# Patient Record
Sex: Female | Born: 1935 | Race: White | Hispanic: No | Smoking: Never smoker
Health system: Southern US, Community
[De-identification: ages and names within clinical notes are randomized; demographics above are authoritative.]

## PROBLEM LIST (undated history)

## (undated) DIAGNOSIS — L98499 Non-pressure chronic ulcer of skin of other sites with unspecified severity: Secondary | ICD-10-CM

## (undated) DIAGNOSIS — I739 Peripheral vascular disease, unspecified: Secondary | ICD-10-CM

## (undated) DIAGNOSIS — I10 Essential (primary) hypertension: Secondary | ICD-10-CM

## (undated) DIAGNOSIS — T4145XA Adverse effect of unspecified anesthetic, initial encounter: Secondary | ICD-10-CM

## (undated) DIAGNOSIS — C801 Malignant (primary) neoplasm, unspecified: Secondary | ICD-10-CM

## (undated) DIAGNOSIS — I639 Cerebral infarction, unspecified: Secondary | ICD-10-CM

## (undated) DIAGNOSIS — J189 Pneumonia, unspecified organism: Secondary | ICD-10-CM

## (undated) DIAGNOSIS — C50919 Malignant neoplasm of unspecified site of unspecified female breast: Secondary | ICD-10-CM

## (undated) DIAGNOSIS — I5032 Chronic diastolic (congestive) heart failure: Secondary | ICD-10-CM

## (undated) DIAGNOSIS — T8859XA Other complications of anesthesia, initial encounter: Secondary | ICD-10-CM

## (undated) DIAGNOSIS — I4891 Unspecified atrial fibrillation: Secondary | ICD-10-CM

## (undated) DIAGNOSIS — M1990 Unspecified osteoarthritis, unspecified site: Secondary | ICD-10-CM

## (undated) DIAGNOSIS — I70209 Unspecified atherosclerosis of native arteries of extremities, unspecified extremity: Secondary | ICD-10-CM

## (undated) DIAGNOSIS — K5900 Constipation, unspecified: Secondary | ICD-10-CM

## (undated) DIAGNOSIS — E119 Type 2 diabetes mellitus without complications: Secondary | ICD-10-CM

## (undated) DIAGNOSIS — H3530 Unspecified macular degeneration: Secondary | ICD-10-CM

## (undated) HISTORY — PX: ABDOMINAL HYSTERECTOMY: SHX81

## (undated) HISTORY — PX: DILATION AND CURETTAGE OF UTERUS: SHX78

## (undated) HISTORY — DX: Chronic diastolic (congestive) heart failure: I50.32

## (undated) HISTORY — PX: CATARACT EXTRACTION W/ INTRAOCULAR LENS  IMPLANT, BILATERAL: SHX1307

---

## 2001-03-07 ENCOUNTER — Encounter (HOSPITAL_COMMUNITY): Admission: RE | Admit: 2001-03-07 | Discharge: 2001-04-06 | Payer: Self-pay | Admitting: *Deleted

## 2002-12-18 ENCOUNTER — Encounter: Admission: RE | Admit: 2002-12-18 | Discharge: 2003-03-18 | Payer: Self-pay | Admitting: Endocrinology

## 2006-11-01 ENCOUNTER — Ambulatory Visit (HOSPITAL_COMMUNITY): Admission: RE | Admit: 2006-11-01 | Discharge: 2006-11-01 | Payer: Self-pay | Admitting: Ophthalmology

## 2006-11-17 ENCOUNTER — Ambulatory Visit (HOSPITAL_COMMUNITY): Admission: RE | Admit: 2006-11-17 | Discharge: 2006-11-17 | Payer: Self-pay | Admitting: Ophthalmology

## 2006-11-30 ENCOUNTER — Ambulatory Visit (HOSPITAL_COMMUNITY): Admission: RE | Admit: 2006-11-30 | Discharge: 2006-11-30 | Payer: Self-pay | Admitting: Ophthalmology

## 2007-04-18 ENCOUNTER — Emergency Department (HOSPITAL_COMMUNITY): Admission: EM | Admit: 2007-04-18 | Discharge: 2007-04-18 | Payer: Self-pay | Admitting: Emergency Medicine

## 2011-01-22 LAB — POCT CARDIAC MARKERS
CKMB, poc: 2.7
Myoglobin, poc: 101
Operator id: 198171
Troponin i, poc: <0.05

## 2011-01-22 LAB — I-STAT 8, (EC8 V) (CONVERTED LAB)
BUN: 14
Bicarbonate: 24.9 — ABNORMAL HIGH
Chloride: 108
Glucose, Bld: 64 — ABNORMAL LOW
HCT: 41
Hemoglobin: 13.9
Operator id: 198171
Potassium: 3.9
Sodium: 140
TCO2: 26
pCO2, Ven: 38.8 — ABNORMAL LOW
pH, Ven: 7.415 — ABNORMAL HIGH

## 2011-01-22 LAB — CBC
HCT: 39.2
Hemoglobin: 13.2
MCHC: 33.8
MCV: 85.7
Platelets: 262
RBC: 4.57
RDW: 14
WBC: 5.8

## 2011-01-22 LAB — DIFFERENTIAL
Basophils Absolute: 0
Basophils Relative: 0
Eosinophils Absolute: 0.1
Eosinophils Relative: 1
Lymphocytes Relative: 27
Lymphs Abs: 1.6
Monocytes Absolute: 0.7
Monocytes Relative: 13 — ABNORMAL HIGH
Neutro Abs: 3.4
Neutrophils Relative %: 58

## 2011-01-22 LAB — POCT I-STAT CREATININE
Creatinine, Ser: 1
Operator id: 198171

## 2011-02-01 LAB — BASIC METABOLIC PANEL
BUN: 13
BUN: 18
CO2: 28
CO2: 29
Calcium: 9.4
Calcium: 9.8
Chloride: 102
Chloride: 99
Creatinine, Ser: 0.91
Creatinine, Ser: 0.93
GFR calc Af Amer: >60
GFR calc Af Amer: >60
GFR calc non Af Amer: 58 — ABNORMAL LOW
GFR calc non Af Amer: 60 — ABNORMAL LOW
Glucose, Bld: 103 — ABNORMAL HIGH
Glucose, Bld: 144 — ABNORMAL HIGH
Potassium: 3 — ABNORMAL LOW
Potassium: 4.3
Sodium: 137
Sodium: 139

## 2011-02-01 LAB — HEMOGLOBIN AND HEMATOCRIT, BLOOD
HCT: 39.3
Hemoglobin: 13.2

## 2012-08-30 ENCOUNTER — Ambulatory Visit: Payer: Self-pay | Admitting: Cardiology

## 2014-04-27 ENCOUNTER — Encounter (HOSPITAL_COMMUNITY): Payer: Self-pay | Admitting: Emergency Medicine

## 2014-04-27 ENCOUNTER — Emergency Department (HOSPITAL_COMMUNITY)
Admission: EM | Admit: 2014-04-27 | Discharge: 2014-04-27 | Disposition: A | Discharge status: Home / Self Care | Payer: Medicare Other | Attending: Emergency Medicine | Admitting: Emergency Medicine

## 2014-04-27 DIAGNOSIS — R197 Diarrhea, unspecified: Secondary | ICD-10-CM | POA: Diagnosis present

## 2014-04-27 DIAGNOSIS — K529 Noninfective gastroenteritis and colitis, unspecified: Secondary | ICD-10-CM | POA: Diagnosis not present

## 2014-04-27 DIAGNOSIS — I10 Essential (primary) hypertension: Secondary | ICD-10-CM | POA: Insufficient documentation

## 2014-04-27 DIAGNOSIS — Z7902 Long term (current) use of antithrombotics/antiplatelets: Secondary | ICD-10-CM | POA: Insufficient documentation

## 2014-04-27 DIAGNOSIS — Z8673 Personal history of transient ischemic attack (TIA), and cerebral infarction without residual deficits: Secondary | ICD-10-CM | POA: Diagnosis not present

## 2014-04-27 DIAGNOSIS — Z79899 Other long term (current) drug therapy: Secondary | ICD-10-CM | POA: Insufficient documentation

## 2014-04-27 HISTORY — DX: Essential (primary) hypertension: I10

## 2014-04-27 HISTORY — DX: Cerebral infarction, unspecified: I63.9

## 2014-04-27 LAB — CBC WITH DIFFERENTIAL/PLATELET
Basophils Absolute: 0 10*3/uL (ref 0.0–0.1)
Basophils Relative: 0 % (ref 0–1)
Eosinophils Absolute: 0.2 10*3/uL (ref 0.0–0.7)
Eosinophils Relative: 2 % (ref 0–5)
HCT: 44.2 % (ref 36.0–46.0)
Hemoglobin: 14.3 g/dL (ref 12.0–15.0)
Lymphocytes Relative: 27 % (ref 12–46)
Lymphs Abs: 1.9 10*3/uL (ref 0.7–4.0)
MCH: 28.3 pg (ref 26.0–34.0)
MCHC: 32.4 g/dL (ref 30.0–36.0)
MCV: 87.5 fL (ref 78.0–100.0)
Monocytes Absolute: 0.5 10*3/uL (ref 0.1–1.0)
Monocytes Relative: 8 % (ref 3–12)
Neutro Abs: 4.3 10*3/uL (ref 1.7–7.7)
Neutrophils Relative %: 63 % (ref 43–77)
Platelets: 178 10*3/uL (ref 150–400)
RBC: 5.05 MIL/uL (ref 3.87–5.11)
RDW: 14 % (ref 11.5–15.5)
WBC: 6.9 10*3/uL (ref 4.0–10.5)

## 2014-04-27 LAB — URINALYSIS, ROUTINE W REFLEX MICROSCOPIC
Bilirubin Urine: NEGATIVE
Glucose, UA: NEGATIVE mg/dL
Hgb urine dipstick: NEGATIVE
Ketones, ur: NEGATIVE mg/dL
Leukocytes, UA: NEGATIVE
Nitrite: NEGATIVE
Protein, ur: NEGATIVE mg/dL
Specific Gravity, Urine: 1.02 (ref 1.005–1.030)
Urobilinogen, UA: 0.2 mg/dL (ref 0.0–1.0)
pH: 7 (ref 5.0–8.0)

## 2014-04-27 LAB — BASIC METABOLIC PANEL
Anion gap: 8 (ref 5–15)
BUN: 21 mg/dL (ref 6–23)
CO2: 25 mmol/L (ref 19–32)
Calcium: 9.1 mg/dL (ref 8.4–10.5)
Chloride: 109 mEq/L (ref 96–112)
Creatinine, Ser: 1.59 mg/dL — ABNORMAL HIGH (ref 0.50–1.10)
GFR calc Af Amer: 33 mL/min — ABNORMAL LOW (ref >90)
GFR calc non Af Amer: 28 mL/min — ABNORMAL LOW (ref >90)
Glucose, Bld: 140 mg/dL — ABNORMAL HIGH (ref 70–99)
Potassium: 3.7 mmol/L (ref 3.5–5.1)
Sodium: 142 mmol/L (ref 135–145)

## 2014-04-27 LAB — TROPONIN I: Troponin I: <0.03 ng/mL (ref <0.031)

## 2014-04-27 MED ORDER — SODIUM CHLORIDE 0.9 % IV BOLUS (SEPSIS)
1000.0000 mL | Freq: Once | INTRAVENOUS | Status: AC
  Administered 2014-04-27: 1000 mL via INTRAVENOUS

## 2014-04-27 MED ORDER — DIPHENOXYLATE-ATROPINE 2.5-0.025 MG PO TABS: 1.0000 | ORAL_TABLET | Freq: Three times a day (TID) | ORAL | Status: DC | PRN

## 2014-04-27 MED ORDER — ONDANSETRON HCL 4 MG/2ML IJ SOLN
4.0000 mg | Freq: Once | INTRAMUSCULAR | Status: AC
  Administered 2014-04-27: 4 mg via INTRAVENOUS
  Filled 2014-04-27: qty 2

## 2014-04-27 MED ORDER — ONDANSETRON HCL 8 MG PO TABS: 8.0000 mg | ORAL_TABLET | ORAL | Status: DC | PRN

## 2014-04-27 MED ORDER — IPRATROPIUM-ALBUTEROL 0.5-2.5 (3) MG/3ML IN SOLN
3.0000 mL | Freq: Once | RESPIRATORY_TRACT | Status: AC
  Administered 2014-04-27: 3 mL via RESPIRATORY_TRACT
  Filled 2014-04-27: qty 3

## 2014-04-27 NOTE — ED Notes (Signed)
Patient arrives via EMS from home with c/o sudden onset N/V/D today.

## 2014-04-27 NOTE — ED Notes (Signed)
Patient ambulated to restroom and noted auditory wheezing. Dr Adriana Simascook notified and neo-neb ordered

## 2014-04-27 NOTE — ED Provider Notes (Signed)
CSN: [id number]     Arrival date & time 04/27/14  1646 History  This chart was scribed for [name] Hutching, MD by Evon Slack, ED Scribe. This patient was seen in room APA07/APA07 and the patient's care was started at 4:53 PM.     Chief Complaint  Patient presents with  . Emesis  . Diarrhea   The history is provided by the patient. No language interpreter was used.   HPI Comments: [name] Petersen is a 81 y.o. female brought in by ambulance, who presents to the Emergency Department complaining of new sudden onset of vomiting onset today at 3 PM. Pt states she has associated nausea and diarrhea. Pt states that she feels slightly dizzy as well. Pt denies any pain. Pt denies any suspicious food intake. Pt denies abdominal pain or any other symptoms. No chest pain, dyspnea, fever, chills, dysuria.  She feels better now  Past Medical History  Diagnosis Date  . Stroke   . Hypertension    Past Surgical History  Procedure Laterality Date  . Abdominal hysterectomy    . Cataract extraction     No family history on file. History  Substance Use Topics  . Smoking status: Never Smoker   . Smokeless tobacco: Not on file  . Alcohol Use: No   OB History    No data available     Review of Systems  Gastrointestinal: Positive for nausea, vomiting and diarrhea. Negative for abdominal distention.  Neurological: Positive for dizziness.  All other systems reviewed and are negative.     Allergies  Scopace  Home Medications   Prior to Admission medications   Medication Sig Start Date End Date Taking? Authorizing Provider  amLODipine (NORVASC) 10 MG tablet Take 10 mg by mouth daily.   Yes Historical Provider, MD  beta carotene w/minerals (OCUVITE) tablet Take 1 tablet by mouth daily.   Yes Historical Provider, MD  cholecalciferol (VITAMIN D) 1000 UNITS tablet Take 1,000 Units by mouth daily.   Yes Historical Provider, MD  clopidogrel (PLAVIX) 75 MG tablet Take 75 mg by mouth daily. 04/03/14  Yes  Historical Provider, MD  losartan-hydrochlorothiazide (HYZAAR) 50-12.5 MG per tablet Take 1 tablet by mouth every morning.   Yes Historical Provider, MD  metFORMIN (GLUCOPHAGE) 500 MG tablet Take 500 mg by mouth daily. 04/03/14  Yes Historical Provider, MD  Multiple Vitamin (MULTIVITAMIN WITH MINERALS) TABS tablet Take 1 tablet by mouth daily.   Yes Historical Provider, MD  vitamin C (ASCORBIC ACID) 500 MG tablet Take 500 mg by mouth daily.   Yes Historical Provider, MD  diphenoxylate-atropine (LOMOTIL) 2.5-0.025 MG per tablet Take 1 tablet by mouth 3 (three) times daily as needed for diarrhea or loose stools. 04/27/14   [name] Hutching, MD  ondansetron (ZOFRAN) 8 MG tablet Take 1 tablet (8 mg total) by mouth every 4 (four) hours as needed. 04/27/14   [name] Hutching, MD   Triage Vitals: BP 194/73 mmHg  Pulse 69  Temp(Src) 97.4 F (36.3 C) (Oral)  Resp 22  Ht  (1.626 m)  Wt 165 lb (74.844 kg)  BMI 28.31 kg/m2  SpO2 97%    Physical Exam  Constitutional: She is oriented to person, place, and time. She appears well-developed and well-nourished.  HENT:  Head: Normocephalic and atraumatic.  Eyes: Conjunctivae and EOM are normal. Pupils are equal, round, and reactive to light.  Neck: Normal range of motion. Neck supple.  Cardiovascular: Normal rate and regular rhythm.   Pulmonary/Chest: Effort normal and  breath sounds normal.  Abdominal: Soft. Bowel sounds are normal. There is no tenderness.  Musculoskeletal: Normal range of motion.  Neurological: She is alert and oriented to person, place, and time.  Skin: Skin is warm and dry.  Psychiatric: She has a normal mood and affect. Her behavior is normal.  Nursing note and vitals reviewed.   ED Course  Procedures (including critical care time) DIAGNOSTIC STUDIES: Oxygen Saturation is 97% on RA, normal by my interpretation.    COORDINATION OF CARE: 5:04 PM-Discussed treatment plan which includes IV fluids with pt at bedside and pt agreed to plan.      Labs Review Labs Reviewed  BASIC METABOLIC PANEL - Abnormal; Notable for the following:    Glucose, Bld 140 (*)    Creatinine, Ser 1.59 (*)    GFR calc non Af Amer 28 (*)    GFR calc Af Amer 33 (*)    All other components within normal limits  CBC WITH DIFFERENTIAL  URINALYSIS, ROUTINE W REFLEX MICROSCOPIC  TROPONIN I    Imaging Review No results found.   EKG Interpretation   Date/Time:  Saturday April 27 2014 18:33:54 EST Ventricular Rate:  71 PR Interval:  227 QRS Duration: 70 QT Interval:  397 QTC Calculation: 431 R Axis:   30 Text Interpretation:  Sinus rhythm Prolonged PR interval Confirmed by Adriana SimasOOK   MD, Kierrah Kilbride ([id number]) on 04/27/2014 6:48:55 PM      MDM   Final diagnoses:  Gastroenteritis   Patient feels better after IV fluids and IV Zofran. Creatinine minimally elevated which would correlate with mild dehydration. Daughter's report normal behavior. Discharge medications Lomotil and Zofran 8 mg  I personally performed the services described in this documentation, which was scribed in my presence. The recorded information has been reviewed and is accurate.      [name] HutchingBrian Kamaile Zachow, MD 04/27/14 2108

## 2014-04-27 NOTE — Discharge Instructions (Signed)
Clear liquids. Medication for nausea and diarrhea. Rest. Return if worse °

## 2014-08-20 ENCOUNTER — Ambulatory Visit (HOSPITAL_COMMUNITY): Payer: Medicare Other | Admitting: Physical Therapy

## 2014-08-21 ENCOUNTER — Ambulatory Visit (HOSPITAL_COMMUNITY): Payer: Medicare Other | Admitting: Physical Therapy

## 2014-08-28 ENCOUNTER — Ambulatory Visit (HOSPITAL_COMMUNITY): Payer: Medicare Other | Attending: Otolaryngology | Admitting: Physical Therapy

## 2014-08-28 VITALS — BP 159/92 | HR 73

## 2014-08-28 DIAGNOSIS — R42 Dizziness and giddiness: Secondary | ICD-10-CM | POA: Insufficient documentation

## 2014-08-28 NOTE — Therapy (Signed)
[facility] [address] [place], [place], [address] Phone: [phone]   Fax:  [phone]  Physical Therapy Evaluation  Patient Details  Name: [name] Petersen MRN: [id number] Date of Birth: 07/04/1926 Referring Provider:  Benito MccreedyWilson, Ewain P., MD  Encounter Date: 08/28/2014      PT End of Session - 08/28/14 1140    Visit Number 1   Number of Visits 1   Authorization Type Medicare   Authorization - Visit Number 1   Authorization - Number of Visits 1   PT Start Time 1056   PT Stop Time 1140   PT Time Calculation (min) 44 min   Activity Tolerance Patient tolerated treatment well   Behavior During Therapy [facility] for tasks assessed/performed      Past Medical History  Diagnosis Date  . Stroke   . Hypertension     Past Surgical History  Procedure Laterality Date  . Abdominal hysterectomy    . Cataract extraction      Filed Vitals:   08/28/14 1141  BP: 159/92  Pulse: 73    Visit Diagnosis:  Dizziness and giddiness - Plan: PT plan of care cert/re-cert      Subjective Assessment - 08/28/14 1142    Pertinent History "I feel like its in my eyes except when i focus on something., I have trouble wih depth perception, and afraid of falling. Notes room never feels like its spinning but in general notes hazziness. Notes increased hazziness with movign and activity, never senses any spinning. Notes some difficulty maintaining good blood pressure secondary to irregular consumption of medication and possibly due to recent change in medication; patient did not take blood pressure medication today. no Falls in the last 6 months. Dizziness most pronounced with turnign and decribed as haziness.  No neck pain, no head aches, no numbness and tingling.  Notes increased urgency and notes difficulty getting to restroom in time occasionally over the last several years. .    How long can you sit comfortably? minimal difficulty, not as bad as with standing.     Currently in Pain? No/denies            [facility] PT Assessment - 08/28/14 0001    Assessment   Medical Diagnosis Dizziness, BPPV   Onset Date 08/27/09   Next MD Visit Josephina ShihEwain Wilson   Prior Therapy yes was helpful, >1 year ago.    Precautions   Precautions None   Balance Screen   Has the patient fallen in the past 6 months No   Has the patient had a decrease in activity level because of a fear of falling?  No   Is the patient reluctant to leave their home because of a fear of falling?  No   Prior Function   Level of Independence Independent with basic ADLs   Observation/Other Assessments   Focus on Therapeutic Outcomes (FOTO)  66% limited   Functional Tests   Functional tests Other;Other2;Sit to Stand   Other:   Other/ Comments Gilberto BetterHallpike Dix test: performed 4x to each side, negative throughout.    Other:   Other/Comments Static visual tracking, and dynamic visual tracking ain cross and "X" directions all negative   Ambulation/Gait   Gait Comments limited rotation thoguhtout trunk and hips, no dizziness with walk except durign bilateral head turns, no diziness with just head turns. Followin walking BP 172/95, HR 77      Patient Education: Patient educated on improtance of Regular medication consumption as  prescribed by MD.        Plan - 08/28/14 1144    Clinical Impression Statement Evaluation completed with inability to increased hazziness/dizziness with any ectivitiy except walking secondary to increase in blood pressure. hazziness of vision appears to be most related to patient's long history of elevated blood pressure that is uncontroleld secondary to irregular consumption of medication. Gait WNL, all vertigo tests were negative. Patient was instructed on importance of regular medication consumption and importance in maintaining low blood pressure tand the negative effects of high blood pressure on heart, CNS, and eyes. Patient instructed to return to MD iimmediately if  symptoms worsen, to take medication regularly and to discuss medication options upon returning to MD in 2 weeks for an already scheduled appointment.    PT Next Visit Plan patient discharged with instructions to take meduications as prescribed.    Consulted and Agree with Plan of Care Patient;Family member/caregiver   Family Member Consulted daughter.           G-Codes - 08/28/14 1149    Functional Assessment Tool Used FOTO ^^% limited   Functional Limitation Mobility: Walking and moving around   Mobility: Walking and Moving Around Current Status [phone](G8978) At least 60 percent but less than 80 percent impaired, limited or restricted   Mobility: Walking and Moving Around Goal Status [phone](G8979) At least 60 percent but less than 80 percent impaired, limited or restricted   Mobility: Walking and Moving Around Discharge Status [phone](G8980) At least 60 percent but less than 80 percent impaired, limited or restricted       Problem List There are no active problems to display for this patient.  Jerilee FieldCash Jennafer Gladue PT DPT [phone]  [facility] [address] [place], [place], [address] Phone: [phone]   Fax:  [phone]

## 2015-04-17 ENCOUNTER — Other Ambulatory Visit (HOSPITAL_COMMUNITY): Payer: Self-pay | Admitting: Nephrology

## 2015-04-17 DIAGNOSIS — N183 Chronic kidney disease, stage 3 unspecified: Secondary | ICD-10-CM

## 2015-04-28 ENCOUNTER — Ambulatory Visit (HOSPITAL_COMMUNITY): Payer: Medicare Other | Attending: Nephrology

## 2015-05-19 DIAGNOSIS — E1140 Type 2 diabetes mellitus with diabetic neuropathy, unspecified: Secondary | ICD-10-CM | POA: Diagnosis not present

## 2015-05-19 DIAGNOSIS — E1151 Type 2 diabetes mellitus with diabetic peripheral angiopathy without gangrene: Secondary | ICD-10-CM | POA: Diagnosis not present

## 2015-05-22 DIAGNOSIS — H353211 Exudative age-related macular degeneration, right eye, with active choroidal neovascularization: Secondary | ICD-10-CM | POA: Diagnosis not present

## 2015-07-10 DIAGNOSIS — H353210 Exudative age-related macular degeneration, right eye, stage unspecified: Secondary | ICD-10-CM | POA: Diagnosis not present

## 2015-08-04 DIAGNOSIS — E1165 Type 2 diabetes mellitus with hyperglycemia: Secondary | ICD-10-CM | POA: Diagnosis not present

## 2015-08-04 DIAGNOSIS — E1151 Type 2 diabetes mellitus with diabetic peripheral angiopathy without gangrene: Secondary | ICD-10-CM | POA: Diagnosis not present

## 2015-08-04 DIAGNOSIS — E1140 Type 2 diabetes mellitus with diabetic neuropathy, unspecified: Secondary | ICD-10-CM | POA: Diagnosis not present

## 2015-08-04 DIAGNOSIS — I10 Essential (primary) hypertension: Secondary | ICD-10-CM | POA: Diagnosis not present

## 2015-08-04 DIAGNOSIS — J309 Allergic rhinitis, unspecified: Secondary | ICD-10-CM | POA: Diagnosis not present

## 2015-08-04 DIAGNOSIS — R42 Dizziness and giddiness: Secondary | ICD-10-CM | POA: Diagnosis not present

## 2015-08-21 DIAGNOSIS — H31012 Macula scars of posterior pole (postinflammatory) (post-traumatic), left eye: Secondary | ICD-10-CM | POA: Diagnosis not present

## 2015-08-21 DIAGNOSIS — H353211 Exudative age-related macular degeneration, right eye, with active choroidal neovascularization: Secondary | ICD-10-CM | POA: Diagnosis not present

## 2015-08-21 DIAGNOSIS — Z961 Presence of intraocular lens: Secondary | ICD-10-CM | POA: Diagnosis not present

## 2015-08-21 DIAGNOSIS — H353221 Exudative age-related macular degeneration, left eye, with active choroidal neovascularization: Secondary | ICD-10-CM | POA: Diagnosis not present

## 2015-09-25 DIAGNOSIS — Z7189 Other specified counseling: Secondary | ICD-10-CM | POA: Diagnosis not present

## 2015-09-25 DIAGNOSIS — I10 Essential (primary) hypertension: Secondary | ICD-10-CM | POA: Diagnosis not present

## 2015-09-25 DIAGNOSIS — Z1389 Encounter for screening for other disorder: Secondary | ICD-10-CM | POA: Diagnosis not present

## 2015-09-25 DIAGNOSIS — R5383 Other fatigue: Secondary | ICD-10-CM | POA: Diagnosis not present

## 2015-09-25 DIAGNOSIS — Z6828 Body mass index (BMI) 28.0-28.9, adult: Secondary | ICD-10-CM | POA: Diagnosis not present

## 2015-09-25 DIAGNOSIS — Z0000 Encounter for general adult medical examination without abnormal findings: Secondary | ICD-10-CM | POA: Diagnosis not present

## 2015-09-25 DIAGNOSIS — Z79899 Other long term (current) drug therapy: Secondary | ICD-10-CM | POA: Diagnosis not present

## 2015-09-25 DIAGNOSIS — Z1211 Encounter for screening for malignant neoplasm of colon: Secondary | ICD-10-CM | POA: Diagnosis not present

## 2015-09-25 DIAGNOSIS — Z299 Encounter for prophylactic measures, unspecified: Secondary | ICD-10-CM | POA: Diagnosis not present

## 2015-10-09 DIAGNOSIS — Z961 Presence of intraocular lens: Secondary | ICD-10-CM | POA: Diagnosis not present

## 2015-10-09 DIAGNOSIS — H31012 Macula scars of posterior pole (postinflammatory) (post-traumatic), left eye: Secondary | ICD-10-CM | POA: Diagnosis not present

## 2015-10-09 DIAGNOSIS — H353221 Exudative age-related macular degeneration, left eye, with active choroidal neovascularization: Secondary | ICD-10-CM | POA: Diagnosis not present

## 2015-10-09 DIAGNOSIS — H353290 Exudative age-related macular degeneration, unspecified eye, stage unspecified: Secondary | ICD-10-CM | POA: Diagnosis not present

## 2015-10-09 DIAGNOSIS — H353211 Exudative age-related macular degeneration, right eye, with active choroidal neovascularization: Secondary | ICD-10-CM | POA: Diagnosis not present

## 2015-10-13 DIAGNOSIS — E1151 Type 2 diabetes mellitus with diabetic peripheral angiopathy without gangrene: Secondary | ICD-10-CM | POA: Diagnosis not present

## 2015-10-13 DIAGNOSIS — E1140 Type 2 diabetes mellitus with diabetic neuropathy, unspecified: Secondary | ICD-10-CM | POA: Diagnosis not present

## 2015-11-06 DIAGNOSIS — R21 Rash and other nonspecific skin eruption: Secondary | ICD-10-CM | POA: Diagnosis not present

## 2015-11-06 DIAGNOSIS — Z299 Encounter for prophylactic measures, unspecified: Secondary | ICD-10-CM | POA: Diagnosis not present

## 2015-11-06 DIAGNOSIS — W57XXXA Bitten or stung by nonvenomous insect and other nonvenomous arthropods, initial encounter: Secondary | ICD-10-CM | POA: Diagnosis not present

## 2015-11-06 DIAGNOSIS — Z789 Other specified health status: Secondary | ICD-10-CM | POA: Diagnosis not present

## 2015-11-10 DIAGNOSIS — E1165 Type 2 diabetes mellitus with hyperglycemia: Secondary | ICD-10-CM | POA: Diagnosis not present

## 2015-11-10 DIAGNOSIS — R21 Rash and other nonspecific skin eruption: Secondary | ICD-10-CM | POA: Diagnosis not present

## 2015-11-14 DIAGNOSIS — E1165 Type 2 diabetes mellitus with hyperglycemia: Secondary | ICD-10-CM | POA: Diagnosis not present

## 2015-11-14 DIAGNOSIS — I639 Cerebral infarction, unspecified: Secondary | ICD-10-CM | POA: Diagnosis not present

## 2015-11-14 DIAGNOSIS — Z79899 Other long term (current) drug therapy: Secondary | ICD-10-CM | POA: Diagnosis not present

## 2015-11-14 DIAGNOSIS — I10 Essential (primary) hypertension: Secondary | ICD-10-CM | POA: Diagnosis not present

## 2015-11-14 DIAGNOSIS — R5383 Other fatigue: Secondary | ICD-10-CM | POA: Diagnosis not present

## 2015-11-14 DIAGNOSIS — R21 Rash and other nonspecific skin eruption: Secondary | ICD-10-CM | POA: Diagnosis not present

## 2015-12-01 DIAGNOSIS — Z79899 Other long term (current) drug therapy: Secondary | ICD-10-CM | POA: Diagnosis not present

## 2015-12-04 DIAGNOSIS — H353211 Exudative age-related macular degeneration, right eye, with active choroidal neovascularization: Secondary | ICD-10-CM | POA: Diagnosis not present

## 2015-12-11 ENCOUNTER — Ambulatory Visit (INDEPENDENT_AMBULATORY_CARE_PROVIDER_SITE_OTHER): Payer: Medicare Other | Admitting: Orthopedic Surgery

## 2015-12-11 ENCOUNTER — Encounter: Payer: Self-pay | Admitting: Orthopedic Surgery

## 2015-12-11 ENCOUNTER — Ambulatory Visit (INDEPENDENT_AMBULATORY_CARE_PROVIDER_SITE_OTHER): Payer: Medicare Other

## 2015-12-11 VITALS — BP 176/87 | HR 75 | Ht 62.0 in | Wt 157.0 lb

## 2015-12-11 DIAGNOSIS — M5137 Other intervertebral disc degeneration, lumbosacral region: Secondary | ICD-10-CM

## 2015-12-11 DIAGNOSIS — M419 Scoliosis, unspecified: Secondary | ICD-10-CM

## 2015-12-11 DIAGNOSIS — M5442 Lumbago with sciatica, left side: Secondary | ICD-10-CM

## 2015-12-11 MED ORDER — DICLOFENAC SODIUM 50 MG PO TBEC: 50.0000 mg | DELAYED_RELEASE_TABLET | Freq: Two times a day (BID) | ORAL | 0 refills | Status: DC

## 2015-12-11 MED ORDER — GABAPENTIN 100 MG PO CAPS: 100.0000 mg | ORAL_CAPSULE | Freq: Three times a day (TID) | ORAL | 0 refills | Status: DC

## 2015-12-11 NOTE — Progress Notes (Signed)
Chief Complaint  Patient presents with  . Back Pain    low back pain, radiates down left side   HPI  82 year old female in her normal state of health until she felt pain in the lateral portion of her thigh for several days and then pain radiated into her lower back and down to her left foot. She presents with 3 weeks of left leg pain worse at night associated with weakness in her left leg to the point where she had to get a cane. She does get relief from putting a pillow under her leg she seems to get worse when she's lying down.  She just got over a bout of severe illness secondary to Medrol dosepak reaction   Review of Systems  Constitutional: Negative for chills and fever.       Blurred vision. No shortness of breath and no chest pain  Genitourinary:       Nighttime urination excess    Neurological: Positive for dizziness.    Past Medical History:  Diagnosis Date  . Hypertension   . Stroke [facility])     Past Surgical History:  Procedure Laterality Date  . ABDOMINAL HYSTERECTOMY    . CATARACT EXTRACTION     No family history on file. Social History  Substance Use Topics  . Smoking status: Never Smoker  . Smokeless tobacco: Not on file  . Alcohol use No    Current Outpatient Prescriptions:  .  amLODipine (NORVASC) 10 MG tablet, Take 10 mg by mouth daily., Disp: , Rfl:  .  beta carotene w/minerals (OCUVITE) tablet, Take 1 tablet by mouth daily., Disp: , Rfl:  .  losartan-hydrochlorothiazide (HYZAAR) 50-12.5 MG per tablet, Take 1 tablet by mouth every morning., Disp: , Rfl:  .  cholecalciferol (VITAMIN D) 1000 UNITS tablet, Take 1,000 Units by mouth daily., Disp: , Rfl:  .  clopidogrel (PLAVIX) 75 MG tablet, Take 75 mg by mouth daily., Disp: , Rfl:  .  diclofenac (VOLTAREN) 50 MG EC tablet, Take 1 tablet (50 mg total) by mouth 2 (two) times daily., Disp: 60 tablet, Rfl: 0 .  diphenoxylate-atropine (LOMOTIL) 2.5-0.025 MG per tablet, Take 1 tablet by mouth 3 (three) times daily  as needed for diarrhea or loose stools., Disp: 15 tablet, Rfl: 0 .  gabapentin (NEURONTIN) 100 MG capsule, Take 1 capsule (100 mg total) by mouth 3 (three) times daily., Disp: 60 capsule, Rfl: 0 .  metFORMIN (GLUCOPHAGE) 500 MG tablet, Take 500 mg by mouth daily., Disp: , Rfl:  .  Multiple Vitamin (MULTIVITAMIN WITH MINERALS) TABS tablet, Take 1 tablet by mouth daily., Disp: , Rfl:  .  ondansetron (ZOFRAN) 8 MG tablet, Take 1 tablet (8 mg total) by mouth every 4 (four) hours as needed., Disp: 10 tablet, Rfl: 0 .  vitamin C (ASCORBIC ACID) 500 MG tablet, Take 500 mg by mouth daily., Disp: , Rfl:   BP (!) 176/87   Pulse 75   Ht 5\' 2"  (1.575 m)   Wt 157 lb (71.2 kg)   BMI 28.72 kg/m   Physical Exam  Constitutional: She is oriented to person, place, and time. She appears well-developed and well-nourished. No distress.  Cardiovascular: Normal rate and intact distal pulses.   Neurological: She is alert and oriented to person, place, and time. She has normal reflexes. She exhibits normal muscle tone. Coordination normal.  Skin: Skin is warm and dry. No rash noted. She is not diaphoretic. No erythema. No pallor.  Psychiatric: She has a normal  mood and affect. Her behavior is normal. Judgment and thought content normal.    Ortho Exam  Walker needed since pain started    the patient could not get on the exam table but are exam consisted of the following she had tenderness in her lower back and especially on the left side.  She had normal range of motion her left hip and left knee and both hip and knee were stable. She had no weakness in the right or left leg her skin was normal on both right and left sides in the lower extremities and she had no peripheral edema in the right or left leg she had normal sensation to soft touch right and left leg  Straight leg raise was slightly reactive on the left  X-ray show degenerative disc disease and scoliosis  Impression scoliosis Degenerative disc  disease Radicular pain left leg  Recommend gabapentin 100 mg 3 times a day diclofenac 50 mg twice a day  Return 2 weeks recheck  ASSESSMENT: My personal interpretation of the images:  Scoliosis Degenerative disc disease Left leg radicular pain  PLAN As above Fuller CanadaStanley Sammi Stolarz, MD 12/11/2015 5:27 PM

## 2015-12-12 ENCOUNTER — Ambulatory Visit: Payer: Medicare Other | Admitting: Orthopedic Surgery

## 2015-12-26 ENCOUNTER — Encounter: Payer: Self-pay | Admitting: Orthopedic Surgery

## 2015-12-26 ENCOUNTER — Ambulatory Visit (INDEPENDENT_AMBULATORY_CARE_PROVIDER_SITE_OTHER): Payer: Medicare Other | Admitting: Orthopedic Surgery

## 2015-12-26 VITALS — BP 158/91 | HR 79 | Ht 62.0 in | Wt 162.0 lb

## 2015-12-26 DIAGNOSIS — M5137 Other intervertebral disc degeneration, lumbosacral region: Secondary | ICD-10-CM | POA: Diagnosis not present

## 2015-12-26 DIAGNOSIS — M419 Scoliosis, unspecified: Secondary | ICD-10-CM | POA: Diagnosis not present

## 2015-12-26 DIAGNOSIS — M5442 Lumbago with sciatica, left side: Secondary | ICD-10-CM | POA: Diagnosis not present

## 2015-12-26 MED ORDER — GABAPENTIN 100 MG PO CAPS: 100.0000 mg | ORAL_CAPSULE | Freq: Three times a day (TID) | ORAL | 0 refills | Status: DC

## 2015-12-26 MED ORDER — DICLOFENAC SODIUM 50 MG PO TBEC: 50.0000 mg | DELAYED_RELEASE_TABLET | Freq: Two times a day (BID) | ORAL | 0 refills | Status: DC

## 2015-12-26 NOTE — Patient Instructions (Signed)
[place]

## 2015-12-26 NOTE — Addendum Note (Signed)
Addended by: Adella HareBOOTHE, Zaion Hreha B on: 12/26/2015 09:49 AM   Modules accepted: Orders

## 2015-12-26 NOTE — Progress Notes (Signed)
Patient ID: [name] Petersen, female   DOB: 06/29/1926, 82 y.o.   MRN: [id number]  Chief Complaint  Patient presents with  . Follow-up    2 week recheck on back pain.    HPI [name] Petersen is a 82 y.o. female.  Two-week follow-up check on her lower back pain and left leg radicular symptoms HPI  She is 82 years old course. She was treated with gabapentin she is allergic to Medrol dosepak. We also put her on diclofenac.  She's made significant improvements in her back pain still having some leg pain especially left hip and proximal thigh with more severe pain at night but she makes note that she is definitely improved  X-rays last time  Impression scoliosis Degenerative disc disease Radicular pain left leg   Recommend gabapentin 100 mg 3 times a day diclofenac 50 mg twice a day   Return 2 weeks recheck  ASSESSMENT: My personal interpretation of the images:  Scoliosis Degenerative disc disease Left leg radicular pain   Review of Systems Review of Systems  As noted in the last record she is not having any I'll or bladder dysfunction. She does have some nighttime urination excess but that's chronic   Physical Exam Ht 5\' 2"  (1.575 m)    Encounter Diagnoses  Name Primary?  . Left-sided low back pain with left-sided sciatica Yes  . Scoliosis deformity of spine   . DDD (degenerative disc disease), lumbosacral     Refill diclofenac and gabapentin  Order physical therapy  Follow-up 6 weeks

## 2015-12-29 ENCOUNTER — Telehealth: Payer: Self-pay | Admitting: Orthopedic Surgery

## 2015-12-29 NOTE — Telephone Encounter (Signed)
Patient's daughter called asking if Dr. Romeo AppleHarrison would do a order for PT with a PT Therapist: Amy Gant. She works through [facility] and comes to the home to work with patients.  Please advise

## 2015-12-29 NOTE — Telephone Encounter (Signed)
Patient's daughter called back with updated information. The therapist's name is not Amy Gant, but its Gerre Pebblesmy Gann and the number is [phone].

## 2015-12-29 NOTE — Telephone Encounter (Signed)
ok 

## 2015-12-29 NOTE — Telephone Encounter (Signed)
ROUTING TO DR HARRISON FOR APPROVAL 

## 2015-12-31 ENCOUNTER — Telehealth: Payer: Self-pay | Admitting: Orthopedic Surgery

## 2015-12-31 NOTE — Telephone Encounter (Signed)
Patient's daughter called and left msg on voicemail about setting up PT with Gerre Pebblesmy Gann through [facility] @ [phone].  Please advise

## 2015-12-31 NOTE — Telephone Encounter (Signed)
Order sent as requested

## 2016-01-02 DIAGNOSIS — I10 Essential (primary) hypertension: Secondary | ICD-10-CM | POA: Diagnosis not present

## 2016-01-02 DIAGNOSIS — H3530 Unspecified macular degeneration: Secondary | ICD-10-CM | POA: Diagnosis not present

## 2016-01-02 DIAGNOSIS — M5117 Intervertebral disc disorders with radiculopathy, lumbosacral region: Secondary | ICD-10-CM | POA: Diagnosis not present

## 2016-01-02 DIAGNOSIS — M419 Scoliosis, unspecified: Secondary | ICD-10-CM | POA: Diagnosis not present

## 2016-01-02 DIAGNOSIS — Z7982 Long term (current) use of aspirin: Secondary | ICD-10-CM | POA: Diagnosis not present

## 2016-01-02 NOTE — Telephone Encounter (Signed)
Spoke with Amy, verbal orders given

## 2016-01-02 NOTE — Telephone Encounter (Signed)
Update - 01/02/16 call received - [facility], physical therapist Amy, also called and requested verbal orders for this patient.  Asked to please call her cell# [phone]

## 2016-01-06 DIAGNOSIS — H3530 Unspecified macular degeneration: Secondary | ICD-10-CM | POA: Diagnosis not present

## 2016-01-06 DIAGNOSIS — M419 Scoliosis, unspecified: Secondary | ICD-10-CM | POA: Diagnosis not present

## 2016-01-06 DIAGNOSIS — Z7982 Long term (current) use of aspirin: Secondary | ICD-10-CM | POA: Diagnosis not present

## 2016-01-06 DIAGNOSIS — I10 Essential (primary) hypertension: Secondary | ICD-10-CM | POA: Diagnosis not present

## 2016-01-06 DIAGNOSIS — M5117 Intervertebral disc disorders with radiculopathy, lumbosacral region: Secondary | ICD-10-CM | POA: Diagnosis not present

## 2016-01-08 DIAGNOSIS — M5117 Intervertebral disc disorders with radiculopathy, lumbosacral region: Secondary | ICD-10-CM | POA: Diagnosis not present

## 2016-01-08 DIAGNOSIS — Z7982 Long term (current) use of aspirin: Secondary | ICD-10-CM | POA: Diagnosis not present

## 2016-01-08 DIAGNOSIS — I10 Essential (primary) hypertension: Secondary | ICD-10-CM | POA: Diagnosis not present

## 2016-01-08 DIAGNOSIS — M419 Scoliosis, unspecified: Secondary | ICD-10-CM | POA: Diagnosis not present

## 2016-01-08 DIAGNOSIS — H3530 Unspecified macular degeneration: Secondary | ICD-10-CM | POA: Diagnosis not present

## 2016-01-09 DIAGNOSIS — M419 Scoliosis, unspecified: Secondary | ICD-10-CM | POA: Diagnosis not present

## 2016-01-09 DIAGNOSIS — Z7982 Long term (current) use of aspirin: Secondary | ICD-10-CM | POA: Diagnosis not present

## 2016-01-09 DIAGNOSIS — H3530 Unspecified macular degeneration: Secondary | ICD-10-CM | POA: Diagnosis not present

## 2016-01-09 DIAGNOSIS — M5117 Intervertebral disc disorders with radiculopathy, lumbosacral region: Secondary | ICD-10-CM | POA: Diagnosis not present

## 2016-01-09 DIAGNOSIS — I10 Essential (primary) hypertension: Secondary | ICD-10-CM | POA: Diagnosis not present

## 2016-01-12 DIAGNOSIS — Z7982 Long term (current) use of aspirin: Secondary | ICD-10-CM | POA: Diagnosis not present

## 2016-01-12 DIAGNOSIS — M5117 Intervertebral disc disorders with radiculopathy, lumbosacral region: Secondary | ICD-10-CM | POA: Diagnosis not present

## 2016-01-12 DIAGNOSIS — I10 Essential (primary) hypertension: Secondary | ICD-10-CM | POA: Diagnosis not present

## 2016-01-12 DIAGNOSIS — H3530 Unspecified macular degeneration: Secondary | ICD-10-CM | POA: Diagnosis not present

## 2016-01-12 DIAGNOSIS — M419 Scoliosis, unspecified: Secondary | ICD-10-CM | POA: Diagnosis not present

## 2016-01-14 DIAGNOSIS — H3530 Unspecified macular degeneration: Secondary | ICD-10-CM | POA: Diagnosis not present

## 2016-01-14 DIAGNOSIS — M5117 Intervertebral disc disorders with radiculopathy, lumbosacral region: Secondary | ICD-10-CM | POA: Diagnosis not present

## 2016-01-14 DIAGNOSIS — I10 Essential (primary) hypertension: Secondary | ICD-10-CM | POA: Diagnosis not present

## 2016-01-14 DIAGNOSIS — Z7982 Long term (current) use of aspirin: Secondary | ICD-10-CM | POA: Diagnosis not present

## 2016-01-14 DIAGNOSIS — M419 Scoliosis, unspecified: Secondary | ICD-10-CM | POA: Diagnosis not present

## 2016-01-19 DIAGNOSIS — Z7982 Long term (current) use of aspirin: Secondary | ICD-10-CM | POA: Diagnosis not present

## 2016-01-19 DIAGNOSIS — M419 Scoliosis, unspecified: Secondary | ICD-10-CM | POA: Diagnosis not present

## 2016-01-19 DIAGNOSIS — M5117 Intervertebral disc disorders with radiculopathy, lumbosacral region: Secondary | ICD-10-CM | POA: Diagnosis not present

## 2016-01-19 DIAGNOSIS — I10 Essential (primary) hypertension: Secondary | ICD-10-CM | POA: Diagnosis not present

## 2016-01-19 DIAGNOSIS — H3530 Unspecified macular degeneration: Secondary | ICD-10-CM | POA: Diagnosis not present

## 2016-01-27 ENCOUNTER — Other Ambulatory Visit: Payer: Self-pay | Admitting: Orthopedic Surgery

## 2016-01-27 DIAGNOSIS — H3530 Unspecified macular degeneration: Secondary | ICD-10-CM | POA: Diagnosis not present

## 2016-01-27 DIAGNOSIS — I10 Essential (primary) hypertension: Secondary | ICD-10-CM | POA: Diagnosis not present

## 2016-01-27 DIAGNOSIS — M5117 Intervertebral disc disorders with radiculopathy, lumbosacral region: Secondary | ICD-10-CM | POA: Diagnosis not present

## 2016-01-27 DIAGNOSIS — Z7982 Long term (current) use of aspirin: Secondary | ICD-10-CM | POA: Diagnosis not present

## 2016-01-27 DIAGNOSIS — M5137 Other intervertebral disc degeneration, lumbosacral region: Secondary | ICD-10-CM

## 2016-01-27 DIAGNOSIS — M5442 Lumbago with sciatica, left side: Secondary | ICD-10-CM

## 2016-01-27 DIAGNOSIS — M419 Scoliosis, unspecified: Secondary | ICD-10-CM

## 2016-01-29 DIAGNOSIS — H353211 Exudative age-related macular degeneration, right eye, with active choroidal neovascularization: Secondary | ICD-10-CM | POA: Diagnosis not present

## 2016-01-30 DIAGNOSIS — H3530 Unspecified macular degeneration: Secondary | ICD-10-CM | POA: Diagnosis not present

## 2016-01-30 DIAGNOSIS — M419 Scoliosis, unspecified: Secondary | ICD-10-CM | POA: Diagnosis not present

## 2016-01-30 DIAGNOSIS — Z7982 Long term (current) use of aspirin: Secondary | ICD-10-CM | POA: Diagnosis not present

## 2016-01-30 DIAGNOSIS — I10 Essential (primary) hypertension: Secondary | ICD-10-CM | POA: Diagnosis not present

## 2016-01-30 DIAGNOSIS — M5117 Intervertebral disc disorders with radiculopathy, lumbosacral region: Secondary | ICD-10-CM | POA: Diagnosis not present

## 2016-02-09 ENCOUNTER — Encounter: Payer: Self-pay | Admitting: Orthopedic Surgery

## 2016-02-09 ENCOUNTER — Ambulatory Visit (INDEPENDENT_AMBULATORY_CARE_PROVIDER_SITE_OTHER): Payer: Medicare Other | Admitting: Orthopedic Surgery

## 2016-02-09 DIAGNOSIS — M5442 Lumbago with sciatica, left side: Secondary | ICD-10-CM | POA: Diagnosis not present

## 2016-02-09 DIAGNOSIS — G8929 Other chronic pain: Secondary | ICD-10-CM | POA: Diagnosis not present

## 2016-02-09 NOTE — Progress Notes (Signed)
Follow-up for lower back pain treated with diclofenac and gabapentin  Patient doing well her leg pain is completely resolved she has some mild discomfort in her lower back on the left side  She did injure her right arm getting in and out of SUV this recurrent problem for her does not require any treatment at present  Improved  Encounter Diagnosis  Name Primary?  . Chronic left-sided low back pain with left-sided sciatica Yes     I told her to call me if she has recurrent symptoms in the leg or back

## 2016-02-23 DIAGNOSIS — E1165 Type 2 diabetes mellitus with hyperglycemia: Secondary | ICD-10-CM | POA: Diagnosis not present

## 2016-02-23 DIAGNOSIS — M1990 Unspecified osteoarthritis, unspecified site: Secondary | ICD-10-CM | POA: Diagnosis not present

## 2016-02-23 DIAGNOSIS — Z23 Encounter for immunization: Secondary | ICD-10-CM | POA: Diagnosis not present

## 2016-02-23 DIAGNOSIS — M25511 Pain in right shoulder: Secondary | ICD-10-CM | POA: Diagnosis not present

## 2016-02-23 DIAGNOSIS — Z299 Encounter for prophylactic measures, unspecified: Secondary | ICD-10-CM | POA: Diagnosis not present

## 2016-02-23 DIAGNOSIS — Z6829 Body mass index (BMI) 29.0-29.9, adult: Secondary | ICD-10-CM | POA: Diagnosis not present

## 2016-02-24 DIAGNOSIS — E1151 Type 2 diabetes mellitus with diabetic peripheral angiopathy without gangrene: Secondary | ICD-10-CM | POA: Diagnosis not present

## 2016-02-24 DIAGNOSIS — E1140 Type 2 diabetes mellitus with diabetic neuropathy, unspecified: Secondary | ICD-10-CM | POA: Diagnosis not present

## 2016-03-25 DIAGNOSIS — H353211 Exudative age-related macular degeneration, right eye, with active choroidal neovascularization: Secondary | ICD-10-CM | POA: Diagnosis not present

## 2016-03-25 DIAGNOSIS — H353221 Exudative age-related macular degeneration, left eye, with active choroidal neovascularization: Secondary | ICD-10-CM | POA: Diagnosis not present

## 2016-05-31 DIAGNOSIS — E1140 Type 2 diabetes mellitus with diabetic neuropathy, unspecified: Secondary | ICD-10-CM | POA: Diagnosis not present

## 2016-05-31 DIAGNOSIS — E1151 Type 2 diabetes mellitus with diabetic peripheral angiopathy without gangrene: Secondary | ICD-10-CM | POA: Diagnosis not present

## 2016-06-10 DIAGNOSIS — H353211 Exudative age-related macular degeneration, right eye, with active choroidal neovascularization: Secondary | ICD-10-CM | POA: Diagnosis not present

## 2016-06-10 DIAGNOSIS — Z961 Presence of intraocular lens: Secondary | ICD-10-CM | POA: Diagnosis not present

## 2016-06-10 DIAGNOSIS — H353221 Exudative age-related macular degeneration, left eye, with active choroidal neovascularization: Secondary | ICD-10-CM | POA: Diagnosis not present

## 2016-07-14 DIAGNOSIS — E1165 Type 2 diabetes mellitus with hyperglycemia: Secondary | ICD-10-CM | POA: Diagnosis not present

## 2016-07-14 DIAGNOSIS — G5600 Carpal tunnel syndrome, unspecified upper limb: Secondary | ICD-10-CM | POA: Diagnosis not present

## 2016-07-14 DIAGNOSIS — I639 Cerebral infarction, unspecified: Secondary | ICD-10-CM | POA: Diagnosis not present

## 2016-07-14 DIAGNOSIS — Z299 Encounter for prophylactic measures, unspecified: Secondary | ICD-10-CM | POA: Diagnosis not present

## 2016-07-14 DIAGNOSIS — J45909 Unspecified asthma, uncomplicated: Secondary | ICD-10-CM | POA: Diagnosis not present

## 2016-07-14 DIAGNOSIS — E7800 Pure hypercholesterolemia, unspecified: Secondary | ICD-10-CM | POA: Diagnosis not present

## 2016-07-14 DIAGNOSIS — I10 Essential (primary) hypertension: Secondary | ICD-10-CM | POA: Diagnosis not present

## 2016-07-14 DIAGNOSIS — G459 Transient cerebral ischemic attack, unspecified: Secondary | ICD-10-CM | POA: Diagnosis not present

## 2016-07-15 DIAGNOSIS — H353231 Exudative age-related macular degeneration, bilateral, with active choroidal neovascularization: Secondary | ICD-10-CM | POA: Diagnosis not present

## 2016-07-15 DIAGNOSIS — Z961 Presence of intraocular lens: Secondary | ICD-10-CM | POA: Diagnosis not present

## 2016-08-16 DIAGNOSIS — E1140 Type 2 diabetes mellitus with diabetic neuropathy, unspecified: Secondary | ICD-10-CM | POA: Diagnosis not present

## 2016-08-16 DIAGNOSIS — E1151 Type 2 diabetes mellitus with diabetic peripheral angiopathy without gangrene: Secondary | ICD-10-CM | POA: Diagnosis not present

## 2016-08-26 DIAGNOSIS — Z961 Presence of intraocular lens: Secondary | ICD-10-CM | POA: Diagnosis not present

## 2016-08-26 DIAGNOSIS — H353231 Exudative age-related macular degeneration, bilateral, with active choroidal neovascularization: Secondary | ICD-10-CM | POA: Diagnosis not present

## 2016-09-21 DIAGNOSIS — E1165 Type 2 diabetes mellitus with hyperglycemia: Secondary | ICD-10-CM | POA: Diagnosis not present

## 2016-09-21 DIAGNOSIS — G459 Transient cerebral ischemic attack, unspecified: Secondary | ICD-10-CM | POA: Diagnosis not present

## 2016-09-21 DIAGNOSIS — E7800 Pure hypercholesterolemia, unspecified: Secondary | ICD-10-CM | POA: Diagnosis not present

## 2016-09-21 DIAGNOSIS — R42 Dizziness and giddiness: Secondary | ICD-10-CM | POA: Diagnosis not present

## 2016-09-21 DIAGNOSIS — Z299 Encounter for prophylactic measures, unspecified: Secondary | ICD-10-CM | POA: Diagnosis not present

## 2016-09-21 DIAGNOSIS — E663 Overweight: Secondary | ICD-10-CM | POA: Diagnosis not present

## 2016-09-21 DIAGNOSIS — Z713 Dietary counseling and surveillance: Secondary | ICD-10-CM | POA: Diagnosis not present

## 2016-09-21 DIAGNOSIS — I10 Essential (primary) hypertension: Secondary | ICD-10-CM | POA: Diagnosis not present

## 2016-09-21 DIAGNOSIS — I639 Cerebral infarction, unspecified: Secondary | ICD-10-CM | POA: Diagnosis not present

## 2016-09-29 DIAGNOSIS — G2581 Restless legs syndrome: Secondary | ICD-10-CM | POA: Diagnosis not present

## 2016-09-29 DIAGNOSIS — E1165 Type 2 diabetes mellitus with hyperglycemia: Secondary | ICD-10-CM | POA: Diagnosis not present

## 2016-09-29 DIAGNOSIS — G459 Transient cerebral ischemic attack, unspecified: Secondary | ICD-10-CM | POA: Diagnosis not present

## 2016-09-29 DIAGNOSIS — Z299 Encounter for prophylactic measures, unspecified: Secondary | ICD-10-CM | POA: Diagnosis not present

## 2016-09-29 DIAGNOSIS — E7800 Pure hypercholesterolemia, unspecified: Secondary | ICD-10-CM | POA: Diagnosis not present

## 2016-09-29 DIAGNOSIS — Z789 Other specified health status: Secondary | ICD-10-CM | POA: Diagnosis not present

## 2016-09-29 DIAGNOSIS — Z6828 Body mass index (BMI) 28.0-28.9, adult: Secondary | ICD-10-CM | POA: Diagnosis not present

## 2016-09-29 DIAGNOSIS — J45909 Unspecified asthma, uncomplicated: Secondary | ICD-10-CM | POA: Diagnosis not present

## 2016-09-29 DIAGNOSIS — G5600 Carpal tunnel syndrome, unspecified upper limb: Secondary | ICD-10-CM | POA: Diagnosis not present

## 2016-09-29 DIAGNOSIS — I10 Essential (primary) hypertension: Secondary | ICD-10-CM | POA: Diagnosis not present

## 2016-09-29 DIAGNOSIS — I639 Cerebral infarction, unspecified: Secondary | ICD-10-CM | POA: Diagnosis not present

## 2016-09-30 DIAGNOSIS — Z961 Presence of intraocular lens: Secondary | ICD-10-CM | POA: Diagnosis not present

## 2016-09-30 DIAGNOSIS — H353221 Exudative age-related macular degeneration, left eye, with active choroidal neovascularization: Secondary | ICD-10-CM | POA: Diagnosis not present

## 2016-09-30 DIAGNOSIS — H353211 Exudative age-related macular degeneration, right eye, with active choroidal neovascularization: Secondary | ICD-10-CM | POA: Diagnosis not present

## 2016-11-03 DIAGNOSIS — I639 Cerebral infarction, unspecified: Secondary | ICD-10-CM | POA: Diagnosis not present

## 2016-11-03 DIAGNOSIS — Z299 Encounter for prophylactic measures, unspecified: Secondary | ICD-10-CM | POA: Diagnosis not present

## 2016-11-03 DIAGNOSIS — J45909 Unspecified asthma, uncomplicated: Secondary | ICD-10-CM | POA: Diagnosis not present

## 2016-11-03 DIAGNOSIS — G2581 Restless legs syndrome: Secondary | ICD-10-CM | POA: Diagnosis not present

## 2016-11-03 DIAGNOSIS — Z6828 Body mass index (BMI) 28.0-28.9, adult: Secondary | ICD-10-CM | POA: Diagnosis not present

## 2016-11-03 DIAGNOSIS — E7800 Pure hypercholesterolemia, unspecified: Secondary | ICD-10-CM | POA: Diagnosis not present

## 2016-11-03 DIAGNOSIS — E1165 Type 2 diabetes mellitus with hyperglycemia: Secondary | ICD-10-CM | POA: Diagnosis not present

## 2016-11-03 DIAGNOSIS — I10 Essential (primary) hypertension: Secondary | ICD-10-CM | POA: Diagnosis not present

## 2016-11-25 DIAGNOSIS — H353231 Exudative age-related macular degeneration, bilateral, with active choroidal neovascularization: Secondary | ICD-10-CM | POA: Diagnosis not present

## 2016-11-25 DIAGNOSIS — Z961 Presence of intraocular lens: Secondary | ICD-10-CM | POA: Diagnosis not present

## 2016-12-14 DIAGNOSIS — E1151 Type 2 diabetes mellitus with diabetic peripheral angiopathy without gangrene: Secondary | ICD-10-CM | POA: Diagnosis not present

## 2016-12-14 DIAGNOSIS — E1140 Type 2 diabetes mellitus with diabetic neuropathy, unspecified: Secondary | ICD-10-CM | POA: Diagnosis not present

## 2017-01-20 DIAGNOSIS — H35361 Drusen (degenerative) of macula, right eye: Secondary | ICD-10-CM | POA: Diagnosis not present

## 2017-01-20 DIAGNOSIS — H353211 Exudative age-related macular degeneration, right eye, with active choroidal neovascularization: Secondary | ICD-10-CM | POA: Diagnosis not present

## 2017-01-20 DIAGNOSIS — H353231 Exudative age-related macular degeneration, bilateral, with active choroidal neovascularization: Secondary | ICD-10-CM | POA: Diagnosis not present

## 2017-01-20 DIAGNOSIS — Z961 Presence of intraocular lens: Secondary | ICD-10-CM | POA: Diagnosis not present

## 2017-02-10 DIAGNOSIS — R5383 Other fatigue: Secondary | ICD-10-CM | POA: Diagnosis not present

## 2017-02-10 DIAGNOSIS — Z1331 Encounter for screening for depression: Secondary | ICD-10-CM | POA: Diagnosis not present

## 2017-02-10 DIAGNOSIS — E1165 Type 2 diabetes mellitus with hyperglycemia: Secondary | ICD-10-CM | POA: Diagnosis not present

## 2017-02-10 DIAGNOSIS — E7800 Pure hypercholesterolemia, unspecified: Secondary | ICD-10-CM | POA: Diagnosis not present

## 2017-02-10 DIAGNOSIS — Z23 Encounter for immunization: Secondary | ICD-10-CM | POA: Diagnosis not present

## 2017-02-10 DIAGNOSIS — Z299 Encounter for prophylactic measures, unspecified: Secondary | ICD-10-CM | POA: Diagnosis not present

## 2017-02-10 DIAGNOSIS — Z79899 Other long term (current) drug therapy: Secondary | ICD-10-CM | POA: Diagnosis not present

## 2017-02-10 DIAGNOSIS — Z7189 Other specified counseling: Secondary | ICD-10-CM | POA: Diagnosis not present

## 2017-02-10 DIAGNOSIS — Z1339 Encounter for screening examination for other mental health and behavioral disorders: Secondary | ICD-10-CM | POA: Diagnosis not present

## 2017-02-10 DIAGNOSIS — Z6828 Body mass index (BMI) 28.0-28.9, adult: Secondary | ICD-10-CM | POA: Diagnosis not present

## 2017-02-10 DIAGNOSIS — I10 Essential (primary) hypertension: Secondary | ICD-10-CM | POA: Diagnosis not present

## 2017-02-10 DIAGNOSIS — Z0000 Encounter for general adult medical examination without abnormal findings: Secondary | ICD-10-CM | POA: Diagnosis not present

## 2017-03-03 DIAGNOSIS — N816 Rectocele: Secondary | ICD-10-CM | POA: Diagnosis not present

## 2017-03-07 DIAGNOSIS — N816 Rectocele: Secondary | ICD-10-CM | POA: Diagnosis not present

## 2017-03-24 DIAGNOSIS — H35361 Drusen (degenerative) of macula, right eye: Secondary | ICD-10-CM | POA: Diagnosis not present

## 2017-03-24 DIAGNOSIS — H353211 Exudative age-related macular degeneration, right eye, with active choroidal neovascularization: Secondary | ICD-10-CM | POA: Diagnosis not present

## 2017-03-24 DIAGNOSIS — H353231 Exudative age-related macular degeneration, bilateral, with active choroidal neovascularization: Secondary | ICD-10-CM | POA: Diagnosis not present

## 2017-04-05 DIAGNOSIS — E1140 Type 2 diabetes mellitus with diabetic neuropathy, unspecified: Secondary | ICD-10-CM | POA: Diagnosis not present

## 2017-04-05 DIAGNOSIS — E1151 Type 2 diabetes mellitus with diabetic peripheral angiopathy without gangrene: Secondary | ICD-10-CM | POA: Diagnosis not present

## 2017-05-12 DIAGNOSIS — I10 Essential (primary) hypertension: Secondary | ICD-10-CM | POA: Diagnosis not present

## 2017-05-12 DIAGNOSIS — Z6828 Body mass index (BMI) 28.0-28.9, adult: Secondary | ICD-10-CM | POA: Diagnosis not present

## 2017-05-12 DIAGNOSIS — Z299 Encounter for prophylactic measures, unspecified: Secondary | ICD-10-CM | POA: Diagnosis not present

## 2017-05-12 DIAGNOSIS — M545 Low back pain: Secondary | ICD-10-CM | POA: Diagnosis not present

## 2017-05-12 DIAGNOSIS — E1165 Type 2 diabetes mellitus with hyperglycemia: Secondary | ICD-10-CM | POA: Diagnosis not present

## 2017-05-18 ENCOUNTER — Telehealth: Payer: Self-pay | Admitting: Orthopedic Surgery

## 2017-05-18 DIAGNOSIS — E1165 Type 2 diabetes mellitus with hyperglycemia: Secondary | ICD-10-CM | POA: Diagnosis not present

## 2017-05-18 DIAGNOSIS — I10 Essential (primary) hypertension: Secondary | ICD-10-CM | POA: Diagnosis not present

## 2017-05-18 DIAGNOSIS — M545 Low back pain: Secondary | ICD-10-CM | POA: Diagnosis not present

## 2017-05-18 DIAGNOSIS — Z7902 Long term (current) use of antithrombotics/antiplatelets: Secondary | ICD-10-CM | POA: Diagnosis not present

## 2017-05-18 DIAGNOSIS — R262 Difficulty in walking, not elsewhere classified: Secondary | ICD-10-CM | POA: Diagnosis not present

## 2017-05-18 NOTE — Telephone Encounter (Signed)
Patient's daughter called wanting to know if you could prescribe the following for the patient. She was last seen  02/09/16.  Diclofenac 50 mg  Qty 60 Tablets  Take 1 tablet (50 mg total) by mouth 2 (two) times daily.  Gabapentin ??  Patient uses [facility].  Please advise

## 2017-05-18 NOTE — Telephone Encounter (Signed)
Amy can you find out what gabapentin could double?  Means and go ahead and prescribe the diclofenac 75 mg twice a day

## 2017-05-19 MED ORDER — GABAPENTIN 100 MG PO CAPS: 100.0000 mg | ORAL_CAPSULE | Freq: Three times a day (TID) | ORAL | 0 refills | Status: DC

## 2017-05-19 NOTE — Telephone Encounter (Signed)
Called to advise. Sent in the Gabapentin.

## 2017-05-19 NOTE — Telephone Encounter (Signed)
Yes resume gabapentin

## 2017-05-19 NOTE — Telephone Encounter (Signed)
previously you had her on gabapentin 100 mg 3 times a day in 2017 /she improved on this medication and  d/c about a month after she took it   Ok to send in Gabapentin same as previous? She is on plavix now, ok to advise daughter Gabapentin only?   Daughter advised appt needed since was 2017 when you last evaluated patient.   She will call to schedule.

## 2017-05-20 DIAGNOSIS — E1165 Type 2 diabetes mellitus with hyperglycemia: Secondary | ICD-10-CM | POA: Diagnosis not present

## 2017-05-20 DIAGNOSIS — Z7902 Long term (current) use of antithrombotics/antiplatelets: Secondary | ICD-10-CM | POA: Diagnosis not present

## 2017-05-20 DIAGNOSIS — M545 Low back pain: Secondary | ICD-10-CM | POA: Diagnosis not present

## 2017-05-20 DIAGNOSIS — I10 Essential (primary) hypertension: Secondary | ICD-10-CM | POA: Diagnosis not present

## 2017-05-20 DIAGNOSIS — R262 Difficulty in walking, not elsewhere classified: Secondary | ICD-10-CM | POA: Diagnosis not present

## 2017-05-24 DIAGNOSIS — M545 Low back pain: Secondary | ICD-10-CM | POA: Diagnosis not present

## 2017-05-24 DIAGNOSIS — E1165 Type 2 diabetes mellitus with hyperglycemia: Secondary | ICD-10-CM | POA: Diagnosis not present

## 2017-05-24 DIAGNOSIS — Z7902 Long term (current) use of antithrombotics/antiplatelets: Secondary | ICD-10-CM | POA: Diagnosis not present

## 2017-05-24 DIAGNOSIS — R262 Difficulty in walking, not elsewhere classified: Secondary | ICD-10-CM | POA: Diagnosis not present

## 2017-05-24 DIAGNOSIS — I10 Essential (primary) hypertension: Secondary | ICD-10-CM | POA: Diagnosis not present

## 2017-05-26 DIAGNOSIS — H43813 Vitreous degeneration, bilateral: Secondary | ICD-10-CM | POA: Diagnosis not present

## 2017-05-26 DIAGNOSIS — H353231 Exudative age-related macular degeneration, bilateral, with active choroidal neovascularization: Secondary | ICD-10-CM | POA: Diagnosis not present

## 2017-05-26 DIAGNOSIS — H35361 Drusen (degenerative) of macula, right eye: Secondary | ICD-10-CM | POA: Diagnosis not present

## 2017-05-26 DIAGNOSIS — Z961 Presence of intraocular lens: Secondary | ICD-10-CM | POA: Diagnosis not present

## 2017-05-27 ENCOUNTER — Ambulatory Visit (INDEPENDENT_AMBULATORY_CARE_PROVIDER_SITE_OTHER): Payer: Medicare Other | Admitting: Obstetrics & Gynecology

## 2017-05-27 ENCOUNTER — Encounter: Payer: Self-pay | Admitting: Obstetrics & Gynecology

## 2017-05-27 VITALS — BP 126/80 | Ht 62.0 in | Wt 160.0 lb

## 2017-05-27 DIAGNOSIS — N816 Rectocele: Secondary | ICD-10-CM

## 2017-05-27 DIAGNOSIS — K5909 Other constipation: Secondary | ICD-10-CM | POA: Diagnosis not present

## 2017-05-27 DIAGNOSIS — R262 Difficulty in walking, not elsewhere classified: Secondary | ICD-10-CM | POA: Diagnosis not present

## 2017-05-27 DIAGNOSIS — I10 Essential (primary) hypertension: Secondary | ICD-10-CM | POA: Diagnosis not present

## 2017-05-27 DIAGNOSIS — M545 Low back pain: Secondary | ICD-10-CM | POA: Diagnosis not present

## 2017-05-27 DIAGNOSIS — Z7902 Long term (current) use of antithrombotics/antiplatelets: Secondary | ICD-10-CM | POA: Diagnosis not present

## 2017-05-27 DIAGNOSIS — E1165 Type 2 diabetes mellitus with hyperglycemia: Secondary | ICD-10-CM | POA: Diagnosis not present

## 2017-05-27 NOTE — Progress Notes (Signed)
[name] Petersen 21-Jan-1927 [id number]   History:    84 y.o. G3P3L3 Widowed  RP:  New patient presenting for prolapse  HPI: S/P Vaginal Hysterectomy.  Had Anterior Repair at a later date.  C/O uncomfortable bulging in the vagina worsening x last 6 months.  Patient feels it at the vulva.   No vaginal/vulvar pain. No vaginal bleeding.  No abnormal vaginal discharge. Chronic constipation with difficulty passing her stools.  Has not tried to push with her fingers vaginally to help pass her stools.  Using Dulcolax, but still having hard stools.  Wearing a pad for frequent urinary leakage.  Abstinent.  Seen by Dr Jul[facility]ene Pina at [facility] and 3 different pessaries tried.  Each one has fallen out almost immediately or after a couple of days.  Patient has not used the Estradiol cream vaginally because of fear of risks associated with Estrogens.  Past medical history,surgical history, family history and social history were all reviewed and documented in the EPIC chart.  Gynecologic History No LMP recorded. Patient has had a hysterectomy. Contraception: status post hysterectomy Last Pap: No recent pap Last mammogram: >10 yrs ago Bone Density: 5 yrs ago Colonoscopy: None  Obstetric History OB History  Gravida Para Term Preterm AB Living  3       0 3  SAB TAB Ectopic Multiple Live Births      0        # Outcome Date GA Lbr Len/2nd Weight Sex Delivery Anes PTL Lv  3 Gravida           2 Gravida           1 Gravida                ROS: A ROS was performed and pertinent positives and negatives are included in the history.  GENERAL: No fevers or chills. HEENT: No change in vision, no earache, sore throat or sinus congestion. NECK: No pain or stiffness. CARDIOVASCULAR: No chest pain or pressure. No palpitations. PULMONARY: No shortness of breath, cough or wheeze. GASTROINTESTINAL: No abdominal pain, nausea, vomiting or diarrhea, melena or bright red blood per rectum. GENITOURINARY: No urinary  frequency, urgency, hesitancy or dysuria. MUSCULOSKELETAL: No joint or muscle pain, no back pain, no recent trauma. DERMATOLOGIC: No rash, no itching, no lesions. ENDOCRINE: No polyuria, polydipsia, no heat or cold intolerance. No recent change in weight. HEMATOLOGICAL: No anemia or easy bruising or bleeding. NEUROLOGIC: No headache, seizures, numbness, tingling or weakness. PSYCHIATRIC: No depression, no loss of interest in normal activity or change in sleep pattern.     Exam:   BP 126/80 (BP Location: Right Arm, Patient Position: Sitting, Cuff Size: Normal)   Ht 5\' 2"  (1.575 m)   Wt 160 lb (72.6 kg)   BMI 29.26 kg/m   Body mass index is 29.26 kg/m.  General appearance : Well developed well nourished female. No acute distress HEENT: Eyes: no retinal hemorrhage or exudates,  Neck supple, trachea midline, no carotid bruits, no thyroidmegaly Lungs: Clear to auscultation, no rhonchi or wheezes, or rib retractions  Heart: Regular rate and rhythm, no murmurs or gallops Breast:Examined in sitting and supine position were symmetrical in appearance, no palpable masses or tenderness,  no skin retraction, no nipple inversion, no nipple discharge, no skin discoloration, no axillary or supraclavicular lymphadenopathy Abdomen: no palpable masses or tenderness, no rebound or guarding Extremities: no edema or skin discoloration or tenderness  Pelvic: Vulva: Normal  Vagina: No gross lesions or discharge.  Rectocele grade 3/3.  Good support of Vaginal Apex and no Cystocele.  Cervix/Uterus absent  Adnexa  Without masses or tenderness  Anus: Normal   Assessment/Plan:  84 y.o. female for annual exam   1. Baden-Walker grade 3 rectocele Isolated rectocele grade 3 out of 3.  Main problem with it is her difficult passage of stools.  Surgery discussed for repair but not recommended at this time.  We will try to improve the consistency of her stools and use her fingers to push the stools out rather  than excessive Valsalva.  F/U 4 weeks.  [facility]  May try a different pessary at that visit.  2. Chronic constipation Nutritional recommendation to soften her stools thoroughly discussed.  Increase water intake.  Recommended Metamucil and Colace, as well as mineral oil.  Counseling on above issues more than 50% for 45 minutes.  Genia DelMarie-Lyne Yazid Pop MD, 2:16 PM 05/27/2017

## 2017-05-30 ENCOUNTER — Encounter: Payer: Self-pay | Admitting: Obstetrics & Gynecology

## 2017-05-30 DIAGNOSIS — Z6829 Body mass index (BMI) 29.0-29.9, adult: Secondary | ICD-10-CM | POA: Diagnosis not present

## 2017-05-30 DIAGNOSIS — E7800 Pure hypercholesterolemia, unspecified: Secondary | ICD-10-CM | POA: Diagnosis not present

## 2017-05-30 DIAGNOSIS — I10 Essential (primary) hypertension: Secondary | ICD-10-CM | POA: Diagnosis not present

## 2017-05-30 DIAGNOSIS — E1165 Type 2 diabetes mellitus with hyperglycemia: Secondary | ICD-10-CM | POA: Diagnosis not present

## 2017-05-30 DIAGNOSIS — R262 Difficulty in walking, not elsewhere classified: Secondary | ICD-10-CM | POA: Diagnosis not present

## 2017-05-30 DIAGNOSIS — L600 Ingrowing nail: Secondary | ICD-10-CM | POA: Diagnosis not present

## 2017-05-30 DIAGNOSIS — Z299 Encounter for prophylactic measures, unspecified: Secondary | ICD-10-CM | POA: Diagnosis not present

## 2017-05-30 DIAGNOSIS — Z7902 Long term (current) use of antithrombotics/antiplatelets: Secondary | ICD-10-CM | POA: Diagnosis not present

## 2017-05-30 DIAGNOSIS — M545 Low back pain: Secondary | ICD-10-CM | POA: Diagnosis not present

## 2017-05-30 DIAGNOSIS — M79675 Pain in left toe(s): Secondary | ICD-10-CM | POA: Diagnosis not present

## 2017-05-30 DIAGNOSIS — Z789 Other specified health status: Secondary | ICD-10-CM | POA: Diagnosis not present

## 2017-05-30 NOTE — Patient Instructions (Signed)
1. Baden-Walker grade 3 rectocele Isolated rectocele grade 3 out of 3.  Main problem with it is her difficult passage of stools.  Surgery discussed for repair but not recommended at this time.  We will try to improve the consistency of her stools and use her fingers to push the stools out rather than excessive Valsalva.  F/U 4 weeks.  Will obtain [facility].  May try a different pessary at that visit.  2. Chronic constipation Nutritional recommendation to soften her stools thoroughly discussed.  Increase water intake.  Recommended Metamucil and Colace, as well as mineral oil.  [name] Petersen, it was a pleasure meeting you today!  I will see you again in 4 weeks.   Constipation, Adult Constipation is when a person has fewer bowel movements in a week than normal, has difficulty having a bowel movement, or has stools that are dry, hard, or larger than normal. Constipation may be caused by an underlying condition. It may become worse with age if a person takes certain medicines and does not take in enough fluids. Follow these instructions at home: Eating and drinking   Eat foods that have a lot of fiber, such as fresh fruits and vegetables, whole grains, and beans.  Limit foods that are high in fat, low in fiber, or overly processed, such as french fries, hamburgers, cookies, candies, and soda.  Drink enough fluid to keep your urine clear or pale yellow. General instructions  Exercise regularly or as told by your health care provider.  Go to the restroom when you have the urge to go. Do not hold it in.  Take over-the-counter and prescription medicines only as told by your health care provider. These include any fiber supplements.  Practice pelvic floor retraining exercises, such as deep breathing while relaxing the lower abdomen and pelvic floor relaxation during bowel movements.  Watch your condition for any changes.  Keep all follow-up visits as told by your  health care provider. This is important. Contact a health care provider if:  You have pain that gets worse.  You have a fever.  You do not have a bowel movement after 4 days.  You vomit.  You are not hungry.  You lose weight.  You are bleeding from the anus.  You have thin, pencil-like stools. Get help right away if:  You have a fever and your symptoms suddenly get worse.  You leak stool or have blood in your stool.  Your abdomen is bloated.  You have severe pain in your abdomen.  You feel dizzy or you faint. This information is not intended to replace advice given to you by your health care provider. Make sure you discuss any questions you have with your health care provider. Document Released: 01/02/2004 Document Revised: 10/24/2015 Document Reviewed: 09/24/2015 Elsevier Interactive Patient Education  2018 [employer] About Rectocele  Overview  A rectocele is a type of hernia which causes different degrees of bulging of the rectal tissues into the vaginal wall.  You may even notice that it presses against the vaginal wall so much that some vaginal tissues droop outside of the opening of your vagina.  Causes of Rectocele  The most common cause is childbirth.  The muscles and ligaments in the pelvis that hold up and support the female organs and vagina become stretched and weakened during labor and delivery.  The more babies you have, the more the support tissues are stretched and weakened.  Not everyone who has a baby  will develop a rectocele.  Some women have stronger supporting tissue in the pelvis and may not have as much of a problem as others.  Women who have a Cesarean section usually do not get rectocele's unless they pushed a long time prior to the cesarean delivery.  Other conditions that can cause a rectocele include chronic constipation, a chronic cough, a lot of heavy lifting, and obesity.  Older women may have this problem because the loss of female hormones  causes the vaginal tissue to become weaker.  Symptoms  There may not be any symptoms.  If you do have symptoms, they may include:  Pelvic pressure in the rectal area  Protrusion of the lower part of the vagina through the opening of the vagina  Constipation and trapping of the stool, making it difficult to have a bowel movement.  In severe cases, you may have to press on the lower part of your vagina to help push the stool out of you rectum.  This is called splinting to empty.  Diagnosing Rectocele  Your health care provider will ask about your symptoms and perform a pelvic exam.  S/he will ask you to bear down, pushing like you are having a bowel movement so as to see how far the lower part of the vagina protrudes into the vagina and possible outside of the vagina.  Your provider will also ask you to contract the muscles of your pelvis (like you are stopping the stream in the middle of urinating) to determine the strength of your pelvic muscles.  Your provider may also do a rectal exam.  Treatment Options  If you do not have any symptoms, no treatment may be necessary.  Other treatment options include:  Pelvic floor exercises: Contracting the muscles in your genital area may help strengthen your muscles and support the organs.  Be sure to get proper exercise instruction from you physical therapist.  A pessary (removealbe pelvic support device) sometimes helps rectocele symptoms.  Surgery: Surgical repair may be necessary. In some cases the uterus may need to be taken out ( a hysterectomy) as well.  There are many types of surgery for pelvic support problems.  Look for physicians who specialize in repair procedures.  You can take care of yourself by:  Treating and preventing constipation  Avoiding heavy lifting, and lifting correctly (with your legs, not with you waist or back)  Treating a chronic cough or bronchitis  Not smoking  avoiding too much weight gain  Doing pelvic  floor exercises   2007, [employer] Doc.33

## 2017-06-01 DIAGNOSIS — E1151 Type 2 diabetes mellitus with diabetic peripheral angiopathy without gangrene: Secondary | ICD-10-CM | POA: Diagnosis not present

## 2017-06-01 DIAGNOSIS — E1140 Type 2 diabetes mellitus with diabetic neuropathy, unspecified: Secondary | ICD-10-CM | POA: Diagnosis not present

## 2017-06-02 DIAGNOSIS — E1165 Type 2 diabetes mellitus with hyperglycemia: Secondary | ICD-10-CM | POA: Diagnosis not present

## 2017-06-02 DIAGNOSIS — R262 Difficulty in walking, not elsewhere classified: Secondary | ICD-10-CM | POA: Diagnosis not present

## 2017-06-02 DIAGNOSIS — M545 Low back pain: Secondary | ICD-10-CM | POA: Diagnosis not present

## 2017-06-02 DIAGNOSIS — I10 Essential (primary) hypertension: Secondary | ICD-10-CM | POA: Diagnosis not present

## 2017-06-02 DIAGNOSIS — Z7902 Long term (current) use of antithrombotics/antiplatelets: Secondary | ICD-10-CM | POA: Diagnosis not present

## 2017-06-06 DIAGNOSIS — M545 Low back pain: Secondary | ICD-10-CM | POA: Diagnosis not present

## 2017-06-06 DIAGNOSIS — E1165 Type 2 diabetes mellitus with hyperglycemia: Secondary | ICD-10-CM | POA: Diagnosis not present

## 2017-06-06 DIAGNOSIS — R262 Difficulty in walking, not elsewhere classified: Secondary | ICD-10-CM | POA: Diagnosis not present

## 2017-06-06 DIAGNOSIS — Z7902 Long term (current) use of antithrombotics/antiplatelets: Secondary | ICD-10-CM | POA: Diagnosis not present

## 2017-06-06 DIAGNOSIS — I10 Essential (primary) hypertension: Secondary | ICD-10-CM | POA: Diagnosis not present

## 2017-06-08 DIAGNOSIS — I998 Other disorder of circulatory system: Secondary | ICD-10-CM | POA: Diagnosis not present

## 2017-06-10 DIAGNOSIS — I639 Cerebral infarction, unspecified: Secondary | ICD-10-CM | POA: Diagnosis not present

## 2017-06-10 DIAGNOSIS — R262 Difficulty in walking, not elsewhere classified: Secondary | ICD-10-CM | POA: Diagnosis not present

## 2017-06-10 DIAGNOSIS — M160 Bilateral primary osteoarthritis of hip: Secondary | ICD-10-CM | POA: Diagnosis not present

## 2017-06-10 DIAGNOSIS — Z7902 Long term (current) use of antithrombotics/antiplatelets: Secondary | ICD-10-CM | POA: Diagnosis not present

## 2017-06-10 DIAGNOSIS — I739 Peripheral vascular disease, unspecified: Secondary | ICD-10-CM | POA: Diagnosis not present

## 2017-06-10 DIAGNOSIS — M545 Low back pain: Secondary | ICD-10-CM | POA: Diagnosis not present

## 2017-06-10 DIAGNOSIS — M25552 Pain in left hip: Secondary | ICD-10-CM | POA: Diagnosis not present

## 2017-06-10 DIAGNOSIS — M47816 Spondylosis without myelopathy or radiculopathy, lumbar region: Secondary | ICD-10-CM | POA: Diagnosis not present

## 2017-06-10 DIAGNOSIS — I10 Essential (primary) hypertension: Secondary | ICD-10-CM | POA: Diagnosis not present

## 2017-06-10 DIAGNOSIS — Z299 Encounter for prophylactic measures, unspecified: Secondary | ICD-10-CM | POA: Diagnosis not present

## 2017-06-10 DIAGNOSIS — Z6829 Body mass index (BMI) 29.0-29.9, adult: Secondary | ICD-10-CM | POA: Diagnosis not present

## 2017-06-10 DIAGNOSIS — E1165 Type 2 diabetes mellitus with hyperglycemia: Secondary | ICD-10-CM | POA: Diagnosis not present

## 2017-06-10 DIAGNOSIS — M79605 Pain in left leg: Secondary | ICD-10-CM | POA: Diagnosis not present

## 2017-06-14 ENCOUNTER — Other Ambulatory Visit: Payer: Self-pay | Admitting: *Deleted

## 2017-06-14 ENCOUNTER — Encounter: Payer: Self-pay | Admitting: *Deleted

## 2017-06-14 ENCOUNTER — Other Ambulatory Visit: Payer: Self-pay

## 2017-06-14 ENCOUNTER — Ambulatory Visit (INDEPENDENT_AMBULATORY_CARE_PROVIDER_SITE_OTHER): Payer: Medicare Other | Admitting: Vascular Surgery

## 2017-06-14 ENCOUNTER — Encounter: Payer: Self-pay | Admitting: Vascular Surgery

## 2017-06-14 VITALS — BP 160/85 | HR 70 | Temp 98.4°F | Resp 18 | Ht 62.0 in | Wt 161.0 lb

## 2017-06-14 DIAGNOSIS — I739 Peripheral vascular disease, unspecified: Secondary | ICD-10-CM

## 2017-06-14 NOTE — Progress Notes (Signed)
HISTORY AND PHYSICAL     CC:  Painful left foot Requesting Provider:  Trey Sailorsoy, Mark, MD  HPI: This is a 84 y.o. female who presents today after being referred by Dr. Channing Muttersoy.  She states she has had pain in her left great toe across to the other toes and her left foot for about a month.  She states she has a little wound on the tip of her left great toe that has been present for about a month.  She states the pain has progressively gotten worse.  The pain wakes her at night and she says that hanging it downward helps with the pain.  She states that her toes have also become red in color over the past month.  She does use a walker and has for the past year, but has needed it more since this issue began ~ a month ago.  She did have ABI's in [place] that revealed arterial insufficiency.    She is on Plavix due to a stroke she had 15-20 years ago.  She states that she used to be diabetic but is not anymore and is not on medication for diabetes.  She is on a CCB and ARB for blood pressure management.  She has never smoked.  She has never been diagnosed with atrial fibrillation.  Past Medical History:  Diagnosis Date  . Hypertension   . Macular degeneration   . Stroke [facility](HCC)     Past Surgical History:  Procedure Laterality Date  . ABDOMINAL HYSTERECTOMY    . CATARACT EXTRACTION      Allergies  Allergen Reactions  . Scopace [Scopolamine] Other (See Comments)    Hallucinations    Current Outpatient Medications  Medication Sig Dispense Refill  . amLODipine (NORVASC) 10 MG tablet Take 10 mg by mouth daily.    . beta carotene w/minerals (OCUVITE) tablet Take 1 tablet by mouth daily.    . clopidogrel (PLAVIX) 75 MG tablet Take 75 mg by mouth daily.    Marland Kitchen. gabapentin (NEURONTIN) 100 MG capsule Take 1 capsule (100 mg total) by mouth 3 (three) times daily. Needs office visit 90 capsule 0  . losartan-hydrochlorothiazide (HYZAAR) 50-12.5 MG per tablet Take 1 tablet by mouth every morning.    . traMADol  (ULTRAM) 50 MG tablet Take 50 mg by mouth every 6 (six) hours as needed.     No current facility-administered medications for this visit.     Family History  Adopted: Yes    Social History   Socioeconomic History  . Marital status: Married    Spouse name: Not on file  . Number of children: Not on file  . Years of education: Not on file  . Highest education level: Not on file  Social Needs  . Financial resource strain: Not on file  . Food insecurity - worry: Not on file  . Food insecurity - inability: Not on file  . Transportation needs - medical: Not on file  . Transportation needs - non-medical: Not on file  Occupational History  . Not on file  Tobacco Use  . Smoking status: Never Smoker  . Smokeless tobacco: Never Used  Substance and Sexual Activity  . Alcohol use: No  . Drug use: No  . Sexual activity: Not Currently    Comment: intercourse age 68,sexual patrners less than 5   Other Topics Concern  . Not on file  Social History Narrative  . Not on file     REVIEW OF SYSTEMS:   [X]   denotes positive finding, [ ]  denotes negative finding Cardiac  Comments:  Chest pain or chest pressure:    Shortness of breath upon exertion:    Short of breath when lying flat:    Irregular heart rhythm:        Vascular    Pain in calf, thigh, or hip brought on by ambulation: x See HPI  Pain in feet at night that wakes you up from your sleep:  x   Blood clot in your veins:    Leg swelling:         Pulmonary    Oxygen at home:    Productive cough:     Wheezing:         Neurologic    Sudden weakness in arms or legs:     Sudden numbness in arms or legs:     Sudden onset of difficulty speaking or slurred speech:    Temporary loss of vision in one eye:     Problems with dizziness:         Gastrointestinal    Blood in stool:     Vomited blood:         Genitourinary    Burning when urinating:     Blood in urine:        Psychiatric    Major depression:           Hematologic    Bleeding problems:    Problems with blood clotting too easily:        Skin    Rashes or ulcers:        Constitutional    Fever or chills:      PHYSICAL EXAMINATION:  Vitals:   06/14/17 1419 06/14/17 1429  BP: (!) 157/87 (!) 160/85  Pulse: 71 70  Resp: 18   Temp: 98.4 F (36.9 C)   SpO2: 97%    Vitals:   06/14/17 1419  Weight: 161 lb (73 kg)  Height: 5\' 2"  (1.575 m)   Body mass index is 29.45 kg/m.  General:  WDWN in NAD; vital signs documented above Gait: Not observed HENT: WNL, normocephalic Pulmonary: normal non-labored breathing , without Rales, rhonchi,  wheezing Cardiac: regular HR, without  Murmurs without carotid bruits Abdomen: soft, NT, no masses Skin: without rashes Vascular Exam/Pulses:  Right Left  Radial 2+ (normal) 2+ (normal)  Femoral 2+ (normal) 2+ (normal)  Popliteal Unable to palpate  Unable to palpate   DP Monophasic doppler signal AT faint doppler signal (unable to hear DP)  PT Faint doppler signal absent  Peroneal Monophasic doppler signal Faint doppler signal   Extremities: with ischemic changes to the tip of the left great toe; toes on left foot are ruborous Musculoskeletal: no muscle wasting or atrophy  Neurologic: A&O X 3;  No focal weakness or paresthesias are detected Psychiatric:  The pt has Normal affect.   Non-Invasive Vascular Imaging:   ABI's at other facility 06/08/17: Evidence of moderate peripheral arterial disease in the right leg.  Disease is indicated in the tibioperoneal segment on the right.  Evidence of severe PAD in the left leg.    Pt meds includes: Statin:  No. Beta Blocker:  No. Aspirin:  No. ACEI:  No. ARB:  Yes.   CCB use:  Yes Other Antiplatelet/Anticoagulant:  Yes.  Plavix   ASSESSMENT/PLAN:: 84 y.o. female with critical limb ischemia left leg   -pt with non healing wound left great toe that has been present for approximately one month.  She  only has faint doppler signals in the  left AT/peroneal.  Will plan for arteriogram with BLE runoff and possible intervention.  Dr. Arbie Cookey did discuss the options for less invasive, but if that is not successful, she may need a bypass.  She and her daughter expressed understanding and wish to proceed.  She denies any hx of kidney problems.   Doreatha Massed, PA-C [facility] [phone]  Clinic MD:  Pt seen and examined with Dr. Arbie Cookey  I have examined the patient, reviewed and agree with above.  Discussed with the patient and her daughter present.  Progressive critical limb ischemia over the past 1 month now with severe rest pain and erythema and ulceration over the tip of her great toe.  We will proceed with arteriography for further evaluation.  Hopefully she will be a candidate for endovascular treatment due to her advanced age of 84.  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  Gretta Began, MD 06/14/2017 4:12 PM

## 2017-06-14 NOTE — H&P (View-Only) (Signed)
HISTORY AND PHYSICAL     CC:  Painful left foot Requesting Provider:  Trey Sailorsoy, Mark, MD  HPI: This is a 84 y.o. female who presents today after being referred by Dr. Channing Muttersoy.  She states she has had pain in her left great toe across to the other toes and her left foot for about a month.  She states she has a little wound on the tip of her left great toe that has been present for about a month.  She states the pain has progressively gotten worse.  The pain wakes her at night and she says that hanging it downward helps with the pain.  She states that her toes have also become red in color over the past month.  She does use a walker and has for the past year, but has needed it more since this issue began ~ a month ago.  She did have ABI's in [place] that revealed arterial insufficiency.    She is on Plavix due to a stroke she had 15-20 years ago.  She states that she used to be diabetic but is not anymore and is not on medication for diabetes.  She is on a CCB and ARB for blood pressure management.  She has never smoked.  She has never been diagnosed with atrial fibrillation.  Past Medical History:  Diagnosis Date  . Hypertension   . Macular degeneration   . Stroke [facility](HCC)     Past Surgical History:  Procedure Laterality Date  . ABDOMINAL HYSTERECTOMY    . CATARACT EXTRACTION      Allergies  Allergen Reactions  . Scopace [Scopolamine] Other (See Comments)    Hallucinations    Current Outpatient Medications  Medication Sig Dispense Refill  . amLODipine (NORVASC) 10 MG tablet Take 10 mg by mouth daily.    . beta carotene w/minerals (OCUVITE) tablet Take 1 tablet by mouth daily.    . clopidogrel (PLAVIX) 75 MG tablet Take 75 mg by mouth daily.    Marland Kitchen. gabapentin (NEURONTIN) 100 MG capsule Take 1 capsule (100 mg total) by mouth 3 (three) times daily. Needs office visit 90 capsule 0  . losartan-hydrochlorothiazide (HYZAAR) 50-12.5 MG per tablet Take 1 tablet by mouth every morning.    . traMADol  (ULTRAM) 50 MG tablet Take 50 mg by mouth every 6 (six) hours as needed.     No current facility-administered medications for this visit.     Family History  Adopted: Yes    Social History   Socioeconomic History  . Marital status: Married    Spouse name: Not on file  . Number of children: Not on file  . Years of education: Not on file  . Highest education level: Not on file  Social Needs  . Financial resource strain: Not on file  . Food insecurity - worry: Not on file  . Food insecurity - inability: Not on file  . Transportation needs - medical: Not on file  . Transportation needs - non-medical: Not on file  Occupational History  . Not on file  Tobacco Use  . Smoking status: Never Smoker  . Smokeless tobacco: Never Used  Substance and Sexual Activity  . Alcohol use: No  . Drug use: No  . Sexual activity: Not Currently    Comment: intercourse age 68,sexual patrners less than 5   Other Topics Concern  . Not on file  Social History Narrative  . Not on file     REVIEW OF SYSTEMS:   [X]   denotes positive finding, [ ]  denotes negative finding Cardiac  Comments:  Chest pain or chest pressure:    Shortness of breath upon exertion:    Short of breath when lying flat:    Irregular heart rhythm:        Vascular    Pain in calf, thigh, or hip brought on by ambulation: x See HPI  Pain in feet at night that wakes you up from your sleep:  x   Blood clot in your veins:    Leg swelling:         Pulmonary    Oxygen at home:    Productive cough:     Wheezing:         Neurologic    Sudden weakness in arms or legs:     Sudden numbness in arms or legs:     Sudden onset of difficulty speaking or slurred speech:    Temporary loss of vision in one eye:     Problems with dizziness:         Gastrointestinal    Blood in stool:     Vomited blood:         Genitourinary    Burning when urinating:     Blood in urine:        Psychiatric    Major depression:           Hematologic    Bleeding problems:    Problems with blood clotting too easily:        Skin    Rashes or ulcers:        Constitutional    Fever or chills:      PHYSICAL EXAMINATION:  Vitals:   06/14/17 1419 06/14/17 1429  BP: (!) 157/87 (!) 160/85  Pulse: 71 70  Resp: 18   Temp: 98.4 F (36.9 C)   SpO2: 97%    Vitals:   06/14/17 1419  Weight: 161 lb (73 kg)  Height: 5\' 2"  (1.575 m)   Body mass index is 29.45 kg/m.  General:  WDWN in NAD; vital signs documented above Gait: Not observed HENT: WNL, normocephalic Pulmonary: normal non-labored breathing , without Rales, rhonchi,  wheezing Cardiac: regular HR, without  Murmurs without carotid bruits Abdomen: soft, NT, no masses Skin: without rashes Vascular Exam/Pulses:  Right Left  Radial 2+ (normal) 2+ (normal)  Femoral 2+ (normal) 2+ (normal)  Popliteal Unable to palpate  Unable to palpate   DP Monophasic doppler signal AT faint doppler signal (unable to hear DP)  PT Faint doppler signal absent  Peroneal Monophasic doppler signal Faint doppler signal   Extremities: with ischemic changes to the tip of the left great toe; toes on left foot are ruborous Musculoskeletal: no muscle wasting or atrophy  Neurologic: A&O X 3;  No focal weakness or paresthesias are detected Psychiatric:  The pt has Normal affect.   Non-Invasive Vascular Imaging:   ABI's at other facility 06/08/17: Evidence of moderate peripheral arterial disease in the right leg.  Disease is indicated in the tibioperoneal segment on the right.  Evidence of severe PAD in the left leg.    Pt meds includes: Statin:  No. Beta Blocker:  No. Aspirin:  No. ACEI:  No. ARB:  Yes.   CCB use:  Yes Other Antiplatelet/Anticoagulant:  Yes.  Plavix   ASSESSMENT/PLAN:: 84 y.o. female with critical limb ischemia left leg   -pt with non healing wound left great toe that has been present for approximately one month.  She  only has faint doppler signals in the  left AT/peroneal.  Will plan for arteriogram with BLE runoff and possible intervention.  Dr. Arbie Cookey did discuss the options for less invasive, but if that is not successful, she may need a bypass.  She and her daughter expressed understanding and wish to proceed.  She denies any hx of kidney problems.   Doreatha Massed, PA-C [facility] [phone]  Clinic MD:  Pt seen and examined with Dr. Arbie Cookey  I have examined the patient, reviewed and agree with above.  Discussed with the patient and her daughter present.  Progressive critical limb ischemia over the past 1 month now with severe rest pain and erythema and ulceration over the tip of her great toe.  We will proceed with arteriography for further evaluation.  Hopefully she will be a candidate for endovascular treatment due to her advanced age of 84.  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  Gretta Began, MD 06/14/2017 4:12 PM

## 2017-06-14 NOTE — Progress Notes (Signed)
Vitals:   06/14/17 1419  BP: (!) 157/87  Pulse: 71  Resp: 18  Temp: 98.4 F (36.9 C)  TempSrc: Oral  SpO2: 97%  Weight: 161 lb (73 kg)  Height: 5\' 2"  (1.575 m)

## 2017-06-15 ENCOUNTER — Encounter: Payer: Self-pay | Admitting: Podiatry

## 2017-06-15 DIAGNOSIS — Z7902 Long term (current) use of antithrombotics/antiplatelets: Secondary | ICD-10-CM | POA: Diagnosis not present

## 2017-06-15 DIAGNOSIS — R262 Difficulty in walking, not elsewhere classified: Secondary | ICD-10-CM | POA: Diagnosis not present

## 2017-06-15 DIAGNOSIS — E1165 Type 2 diabetes mellitus with hyperglycemia: Secondary | ICD-10-CM | POA: Diagnosis not present

## 2017-06-15 DIAGNOSIS — I10 Essential (primary) hypertension: Secondary | ICD-10-CM | POA: Diagnosis not present

## 2017-06-15 DIAGNOSIS — M545 Low back pain: Secondary | ICD-10-CM | POA: Diagnosis not present

## 2017-06-16 ENCOUNTER — Encounter (HOSPITAL_COMMUNITY)
Admission: RE | Disposition: A | Discharge status: Home / Self Care | Payer: Self-pay | Source: Ambulatory Visit | Attending: Vascular Surgery

## 2017-06-16 ENCOUNTER — Telehealth: Payer: Self-pay | Admitting: Vascular Surgery

## 2017-06-16 ENCOUNTER — Ambulatory Visit (HOSPITAL_COMMUNITY)
Admission: RE | Admit: 2017-06-16 | Discharge: 2017-06-16 | Disposition: A | Discharge status: Home / Self Care | Payer: Medicare Other | Source: Ambulatory Visit | Attending: Vascular Surgery | Admitting: Vascular Surgery

## 2017-06-16 DIAGNOSIS — I70234 Atherosclerosis of native arteries of right leg with ulceration of heel and midfoot: Secondary | ICD-10-CM | POA: Diagnosis not present

## 2017-06-16 DIAGNOSIS — I70245 Atherosclerosis of native arteries of left leg with ulceration of other part of foot: Secondary | ICD-10-CM | POA: Diagnosis not present

## 2017-06-16 DIAGNOSIS — Z8673 Personal history of transient ischemic attack (TIA), and cerebral infarction without residual deficits: Secondary | ICD-10-CM | POA: Insufficient documentation

## 2017-06-16 DIAGNOSIS — I10 Essential (primary) hypertension: Secondary | ICD-10-CM | POA: Diagnosis not present

## 2017-06-16 DIAGNOSIS — E1151 Type 2 diabetes mellitus with diabetic peripheral angiopathy without gangrene: Secondary | ICD-10-CM | POA: Insufficient documentation

## 2017-06-16 DIAGNOSIS — Z7902 Long term (current) use of antithrombotics/antiplatelets: Secondary | ICD-10-CM | POA: Insufficient documentation

## 2017-06-16 DIAGNOSIS — E11621 Type 2 diabetes mellitus with foot ulcer: Secondary | ICD-10-CM | POA: Diagnosis not present

## 2017-06-16 DIAGNOSIS — I7025 Atherosclerosis of native arteries of other extremities with ulceration: Secondary | ICD-10-CM | POA: Diagnosis present

## 2017-06-16 DIAGNOSIS — L97529 Non-pressure chronic ulcer of other part of left foot with unspecified severity: Secondary | ICD-10-CM | POA: Insufficient documentation

## 2017-06-16 DIAGNOSIS — H3530 Unspecified macular degeneration: Secondary | ICD-10-CM | POA: Insufficient documentation

## 2017-06-16 HISTORY — PX: ABDOMINAL AORTOGRAM W/LOWER EXTREMITY: CATH118223

## 2017-06-16 LAB — POCT I-STAT, CHEM 8
BUN: 30 mg/dL — ABNORMAL HIGH (ref 6–20)
Calcium, Ion: 1.19 mmol/L (ref 1.15–1.40)
Chloride: 99 mmol/L — ABNORMAL LOW (ref 101–111)
Creatinine, Ser: 1.3 mg/dL — ABNORMAL HIGH (ref 0.44–1.00)
Glucose, Bld: 98 mg/dL (ref 65–99)
HCT: 46 % (ref 36.0–46.0)
Hemoglobin: 15.6 g/dL — ABNORMAL HIGH (ref 12.0–15.0)
Potassium: 4.6 mmol/L (ref 3.5–5.1)
Sodium: 139 mmol/L (ref 135–145)
TCO2: 31 mmol/L (ref 22–32)

## 2017-06-16 SURGERY — ABDOMINAL AORTOGRAM W/LOWER EXTREMITY
Anesthesia: LOCAL

## 2017-06-16 MED ORDER — HEPARIN (PORCINE) IN NACL 2-0.9 UNIT/ML-% IJ SOLN
INTRAMUSCULAR | Status: AC | PRN
  Administered 2017-06-16: 500 mL

## 2017-06-16 MED ORDER — FENTANYL CITRATE (PF) 100 MCG/2ML IJ SOLN
INTRAMUSCULAR | Status: AC
  Filled 2017-06-16: qty 2

## 2017-06-16 MED ORDER — HYDRALAZINE HCL 20 MG/ML IJ SOLN
INTRAMUSCULAR | Status: AC
  Filled 2017-06-16: qty 1

## 2017-06-16 MED ORDER — SODIUM CHLORIDE 0.9 % IV SOLN: 250.0000 mL | INTRAVENOUS | Status: DC | PRN

## 2017-06-16 MED ORDER — MIDAZOLAM HCL 2 MG/2ML IJ SOLN
INTRAMUSCULAR | Status: AC
  Filled 2017-06-16: qty 2

## 2017-06-16 MED ORDER — LIDOCAINE HCL (PF) 1 % IJ SOLN
INTRAMUSCULAR | Status: DC | PRN
  Administered 2017-06-16: 18 mL

## 2017-06-16 MED ORDER — OXYCODONE HCL 5 MG PO TABS: 5.0000 mg | ORAL_TABLET | ORAL | Status: DC | PRN

## 2017-06-16 MED ORDER — LABETALOL HCL 5 MG/ML IV SOLN: 10.0000 mg | INTRAVENOUS | Status: DC | PRN

## 2017-06-16 MED ORDER — SODIUM CHLORIDE 0.9 % IV SOLN
INTRAVENOUS | Status: DC
  Administered 2017-06-16 (×2): via INTRAVENOUS

## 2017-06-16 MED ORDER — HYDRALAZINE HCL 20 MG/ML IJ SOLN: 5.0000 mg | INTRAMUSCULAR | Status: DC | PRN

## 2017-06-16 MED ORDER — LIDOCAINE HCL 1 % IJ SOLN
INTRAMUSCULAR | Status: AC
  Filled 2017-06-16: qty 20

## 2017-06-16 MED ORDER — SODIUM CHLORIDE 0.9% FLUSH: 3.0000 mL | INTRAVENOUS | Status: DC | PRN

## 2017-06-16 MED ORDER — MORPHINE SULFATE (PF) 10 MG/ML IV SOLN: 2.0000 mg | INTRAVENOUS | Status: DC | PRN

## 2017-06-16 MED ORDER — HEPARIN (PORCINE) IN NACL 2-0.9 UNIT/ML-% IJ SOLN
INTRAMUSCULAR | Status: AC
  Filled 2017-06-16: qty 1000

## 2017-06-16 MED ORDER — SODIUM CHLORIDE 0.9 % WEIGHT BASED INFUSION: 1.0000 mL/kg/h | INTRAVENOUS | Status: DC

## 2017-06-16 MED ORDER — SODIUM CHLORIDE 0.9% FLUSH: 3.0000 mL | Freq: Two times a day (BID) | INTRAVENOUS | Status: DC

## 2017-06-16 MED ORDER — FENTANYL CITRATE (PF) 100 MCG/2ML IJ SOLN
INTRAMUSCULAR | Status: DC | PRN
  Administered 2017-06-16: 25 ug via INTRAVENOUS

## 2017-06-16 MED ORDER — MIDAZOLAM HCL 2 MG/2ML IJ SOLN
INTRAMUSCULAR | Status: DC | PRN
  Administered 2017-06-16: 0.5 mg via INTRAVENOUS

## 2017-06-16 SURGICAL SUPPLY — 10 items
CATH OMNI FLUSH 5F 65CM (CATHETERS) ×1 IMPLANT
COVER PRB 48X5XTLSCP FOLD TPE (BAG) IMPLANT
COVER PROBE 5X48 (BAG) ×2
KIT MICROPUNCTURE NIT STIFF (SHEATH) ×1 IMPLANT
KIT PV (KITS) ×2 IMPLANT
SHEATH PINNACLE 5F 10CM (SHEATH) ×1 IMPLANT
SYR MEDRAD MARK V 150ML (SYRINGE) ×2 IMPLANT
TRANSDUCER W/STOPCOCK (MISCELLANEOUS) ×2 IMPLANT
TRAY PV CATH (CUSTOM PROCEDURE TRAY) ×2 IMPLANT
WIRE BENTSON .035X145CM (WIRE) ×1 IMPLANT

## 2017-06-16 NOTE — Telephone Encounter (Signed)
-----   Message from Sharee PimpleMarilyn K McChesney, RN sent at 06/16/2017  1:13 PM EST ----- Regarding: 2-4 weeks post op   ----- Message ----- From: Fransisco Hertzhen, Brian L, MD Sent: 06/16/2017  11:10 AM To: [facility] [address]  Alfonse Flavorsauline C Wyse [phone] 03/01/1927  PROCEDURE: 1.  right common femoral artery cannulation under ultrasound guidance 2.  Placement of catheter in aorta 3.  Aortogram 4.  Conscious sedation for 39 min 5.  Second order arterial selection 6.  left leg runoff via catheter  Follow-up: Dr. Arbie CookeyEarly 2-4 weeks

## 2017-06-16 NOTE — Interval H&P Note (Signed)
   History and Physical Update  The patient was interviewed and re-examined.  The patient's previous History and Physical has been reviewed and is unchanged from Dr. Bosie HelperEarly's consult.  There is no change in the plan of care: aortogram, left leg runoff, and possible intervention.   I discussed with the patient the nature of angiographic procedures, especially the limited patencies of any endovascular intervention.    The patient is aware of that the risks of an angiographic procedure include but are not limited to: bleeding, infection, access site complications, renal failure, embolization, rupture of vessel, dissection, arteriovenous fistula, possible need for emergent surgical intervention, possible need for surgical procedures to treat the patient's pathology, anaphylactic reaction to contrast, and stroke and death.    The patient is aware of the risks and agrees to proceed.   Leonides SakeBrian Jondavid Schreier, MD, FACS [facility] [place] Office: [phone] Pager: [phone]  06/16/2017, 7:32 AM

## 2017-06-16 NOTE — Discharge Instructions (Signed)

## 2017-06-16 NOTE — Progress Notes (Signed)
Procedure explained to patient and Rt femoral artery access site assessed: level 0, Doppler  dorsalis pedis . 5French Sheath removed and manual pressure applied for 20 minutes. Pre, peri, & post procedural vitals: HR 65-70's, RR 15-18, O2 Sat upper 100 2 liter [place], BP 105/50 fluids bolus given  Final 113/58, Pain 6-8 out of 10 lt. Great toe pain. Distal pulses remained intact after sheath removal. Access site level 0 and dressed with 4X4 gauze and tegaderm.Post procedural instructions discussed and return demonstration from patient.

## 2017-06-16 NOTE — Telephone Encounter (Signed)
Sched appt 07/12/17 at 3:30. Lm on hm# to inform pt of appt.

## 2017-06-16 NOTE — Op Note (Signed)
OPERATIVE NOTE   PROCEDURE: 1.  right common femoral artery cannulation under ultrasound guidance 2.  Placement of catheter in aorta 3.  Aortogram 4.  Conscious sedation for 39 min 5.  Second order arterial selection 6.  left leg runoff via catheter  PRE-OPERATIVE DIAGNOSIS: left great toe ulcer  POST-OPERATIVE DIAGNOSIS: same as above   SURGEON: Leonides SakeBrian Zariyah Stephens, MD  ANESTHESIA: conscious sedation  ESTIMATED BLOOD LOSS: 50 cc  CONTRAST: 165 cc  FINDING(S):  Aorta: patent, diffusely calcified  Superior mesenteric artery: patent   Right Left  RA Patent, 50% stenosis Patent, >70% stenosis  CIA Patent Patent  EIA Patent Patent  IIA Patent Patent  CFA  Patent  SFA  Patent  PFA  Patent  Pop  Occluded, extensive collaterals evident  Trif  Occluded, extensive collaterals evident  AT  Occluded, extensive collaterals reconstitute distal miniscule artery  Pero  Occluded, extensive collaterals reconstitute distal miniscule artery  PT  Occluded, extensive collaterals evident   SPECIMEN(S):  none  INDICATIONS:   [name] Petersen is a 84 y.o. female who presents with left great toe ulcer.  The patient presents for: aortogram, bilateral leg runoff and possible intervention.  I discussed with the patient the nature of angiographic procedures, especially the limited patencies of any endovascular intervention.  I discussed with the patient the nature of angiographic procedures, especially the limited patencies of any endovascular intervention.  The patient is aware of that the risks of an angiographic procedure include but are not limited to: bleeding, infection, access site complications, renal failure, embolization, rupture of vessel, dissection, arteriovenous fistula, possible need for emergent surgical intervention, possible need for surgical procedures to treat the patient's pathology, anaphylactic reaction to contrast, and stroke and death.  The patient is aware of the risks and agrees  to proceed.    DESCRIPTION: After full informed consent was obtained from the patient, the patient was brought back to the angiography suite.  The patient was placed supine upon the angiography table and connected to cardiopulmonary monitoring equipment.  The patient was then given conscious sedation, the amounts of which are documented in the patient's chart.  The patient was prepped and drape in the standard fashion for an angiographic procedure.  At this point, attention was turned to the right groin.  Under ultrasound guidance, the subcutaneous tissue surrounding the right common femoral artery was anesthesized with 1% lidocaine with epinephrine.  The artery was then cannulated with a micropuncture needle.  The microwire was advanced into the iliac arterial system.  The needle was exchanged for a microsheath, which was loaded into the common femoral artery over the wire.  The microwire was exchanged for a Bentson wire which was advanced into the aorta.  The microsheath was then exchanged for a 5-Fr sheath which was loaded into the common femoral artery.  The Omniflush catheter was then loaded over the wire up to the level of L1.  The catheter was connected to the power injector circuit.  After de-airring and de-clotting the circuit, a power injector aortogram was completed.  The findings are listed above.  The Bentson wire was replaced in the catheter, and [facility] and Omniflush catheter, the left common iliac artery was selected.  The catheter and wire were advanced into the external iliac artery.  An automated left leg runoff was completed after de-airring and de-clotting the circuit.  The findings are as listed above.    I then did a dedicated left tibial injection to try  to image the tibial arteries in the left calf for a distal bypass target.  The patient had some difficulty holding still likely due to contrast displacement of blood flow.  A lateral foot was then obtained.  The patient  could not hold still for this injection, so a repeat injection had to be completed.  The Bentson wire was replaced in the catheter to straighten the crook of the catheter.  Both were removed together from the sheath.  The right sheath was aspirated and no clot was present.  I elected to NOT complete the right leg runoff due to the contrast needed to completely image the left foot.   COMPLICATIONS: none  CONDITION: stable   Leonides Sake, MD, [facility] [place] Office: [phone] Pager: [phone]  06/16/2017, 11:03 AM

## 2017-06-17 ENCOUNTER — Telehealth: Payer: Self-pay | Admitting: *Deleted

## 2017-06-17 ENCOUNTER — Encounter (HOSPITAL_COMMUNITY): Payer: Self-pay | Admitting: Vascular Surgery

## 2017-06-17 MED FILL — Hydralazine HCl Inj 20 MG/ML: INTRAMUSCULAR | Qty: 1 | Status: AC

## 2017-06-17 MED FILL — Lidocaine HCl Local Inj 1%: INTRAMUSCULAR | Qty: 20 | Status: AC

## 2017-06-17 NOTE — Telephone Encounter (Signed)
No further notes at this time. 

## 2017-06-17 NOTE — Telephone Encounter (Signed)
Call from patient's daughter. Patient is having significant pain in legs and wants earlier appointment with Dr. Arbie CookeyEarly. 06/28/17 appt.given (overbooked) to be here at 1pm. Instructed to go to the [facility] ER for any acute distress, worsening condition or emergency.

## 2017-06-28 ENCOUNTER — Ambulatory Visit: Payer: Medicare Other | Admitting: Obstetrics & Gynecology

## 2017-06-28 ENCOUNTER — Encounter: Payer: Self-pay | Admitting: Vascular Surgery

## 2017-06-28 ENCOUNTER — Ambulatory Visit (INDEPENDENT_AMBULATORY_CARE_PROVIDER_SITE_OTHER): Payer: Medicare Other | Admitting: Vascular Surgery

## 2017-06-28 VITALS — BP 156/81 | HR 75 | Temp 97.2°F | Resp 16

## 2017-06-28 DIAGNOSIS — I739 Peripheral vascular disease, unspecified: Secondary | ICD-10-CM | POA: Diagnosis not present

## 2017-06-28 NOTE — H&P (View-Only) (Signed)
  [facility] of [place]  Patient name: [name] Petersen MRN: [id number] DOB: 05/09/1926 Sex: female  REASON FOR VISIT: Follow-up lower extremity pain and critical limb ischemia  HPI: [name] Petersen is a 84 y.o. female here today for follow-up.  She is here with her 2 daughters and granddaughter.  She continues to have severe arterial rest pain in her left foot.  She reports that she has 2 hold her leg dependently over the bed at night to allow any rest.  She has had no fevers or progression of her tissue loss with blistering on the tip of her left great toe.  Past Medical History:  Diagnosis Date  . Hypertension   . Macular degeneration   . Stroke (HCC)     Family History  Adopted: Yes    SOCIAL HISTORY: Social History   Tobacco Use  . Smoking status: Never Smoker  . Smokeless tobacco: Never Used  Substance Use Topics  . Alcohol use: No    Allergies  Allergen Reactions  . Scopace [Scopolamine] Other (See Comments)    Hallucinations    Current Outpatient Medications  Medication Sig Dispense Refill  . amLODipine (NORVASC) 10 MG tablet Take 10 mg by mouth daily.    . beta carotene w/minerals (OCUVITE) tablet Take 1 tablet by mouth daily.    . clopidogrel (PLAVIX) 75 MG tablet Take 75 mg by mouth daily.    . losartan-hydrochlorothiazide (HYZAAR) 50-12.5 MG per tablet Take 1 tablet by mouth 2 (two) times daily.     . traMADol (ULTRAM) 50 MG tablet Take 50 mg by mouth every 6 (six) hours as needed for moderate pain.     . gabapentin (NEURONTIN) 100 MG capsule Take 1 capsule (100 mg total) by mouth 3 (three) times daily. Needs office visit (Patient not taking: Reported on 06/28/2017) 90 capsule 0   No current facility-administered medications for this visit.     REVIEW OF SYSTEMS:  [X] denotes positive finding, [ ] denotes negative finding Cardiac  Comments:  Chest pain or chest pressure:    Shortness of breath upon  exertion:    Short of breath when lying flat:    Irregular heart rhythm:        Vascular    Pain in calf, thigh, or hip brought on by ambulation: x   Pain in feet at night that wakes you up from your sleep:  x   Blood clot in your veins:    Leg swelling:           PHYSICAL EXAM: Vitals:   06/28/17 1259  BP: (!) 156/81  Pulse: 75  Resp: 16  Temp: (!) 97.2 F (36.2 C)  TempSrc: Oral  SpO2: 96%    GENERAL: The patient is a well-nourished female, in no acute distress. The vital signs are documented above. CARDIOVASCULAR: No palpable pedal pulses right or left PULMONARY: There is good air exchange  MUSCULOSKELETAL: There are no major deformities or cyanosis. NEUROLOGIC: No focal weakness or paresthesias are detected. SKIN: There are no ulcers or rashes noted.  Blistering noted on her tip of her left great toe.  Dependent rubor present PSYCHIATRIC: The patient has a normal affect.  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  I explained that there is no option   for revascularization since she has no distal target.  I explained the only option is amputation and this would be at the below-knee level.  I explained that she should have adequate flow for healing a below-knee amputation but it is possible that this would be nonhealing as well.  They had multiple questions regarding options for bypass and I explained that with no distal target that this would not be possible.  She fortunately is having no infection or progressive tissue loss but is having very severe rest pain which is intolerable.  She is willing to proceed with amputation and will discuss this with the rest of her family and call us when she wishes to schedule this.    Duane Trias F. Dreyton Roessner, MD FACS [facility] of  [place] Office Tel [phone] Pager [phone]  

## 2017-06-28 NOTE — Progress Notes (Signed)
[facility] of [place]  Patient name: [name] Petersen MRN: [id number] DOB: [birth year] Sex: female  REASON FOR VISIT: Follow-up lower extremity pain and critical limb ischemia  HPI: CELESTINE PRIM is a 84 y.o. female here today for follow-up.  She is here with her 2 daughters and granddaughter.  She continues to have severe arterial rest pain in her left foot.  She reports that she has 2 hold her leg dependently over the bed at night to allow any rest.  She has had no fevers or progression of her tissue loss with blistering on the tip of her left great toe.  Past Medical History:  Diagnosis Date  . Hypertension   . Macular degeneration   . Stroke [facility])     Family History  Adopted: Yes    SOCIAL HISTORY: Social History   Tobacco Use  . Smoking status: Never Smoker  . Smokeless tobacco: Never Used  Substance Use Topics  . Alcohol use: No    Allergies  Allergen Reactions  . Scopace [Scopolamine] Other (See Comments)    Hallucinations    Current Outpatient Medications  Medication Sig Dispense Refill  . amLODipine (NORVASC) 10 MG tablet Take 10 mg by mouth daily.    . beta carotene w/minerals (OCUVITE) tablet Take 1 tablet by mouth daily.    . clopidogrel (PLAVIX) 75 MG tablet Take 75 mg by mouth daily.    Marland Kitchen losartan-hydrochlorothiazide (HYZAAR) 50-12.5 MG per tablet Take 1 tablet by mouth 2 (two) times daily.     . traMADol (ULTRAM) 50 MG tablet Take 50 mg by mouth every 6 (six) hours as needed for moderate pain.     Marland Kitchen gabapentin (NEURONTIN) 100 MG capsule Take 1 capsule (100 mg total) by mouth 3 (three) times daily. Needs office visit (Patient not taking: Reported on 06/28/2017) 90 capsule 0   No current facility-administered medications for this visit.     REVIEW OF SYSTEMS:  [X]  denotes positive finding, [ ]  denotes negative finding Cardiac  Comments:  Chest pain or chest pressure:    Shortness of breath upon  exertion:    Short of breath when lying flat:    Irregular heart rhythm:        Vascular    Pain in calf, thigh, or hip brought on by ambulation: x   Pain in feet at night that wakes you up from your sleep:  x   Blood clot in your veins:    Leg swelling:           PHYSICAL EXAM: Vitals:   06/28/17 1259  BP: (!) 156/81  Pulse: 75  Resp: 16  Temp: (!) 97.2 F (36.2 C)  TempSrc: Oral  SpO2: 96%    GENERAL: The patient is a well-nourished female, in no acute distress. The vital signs are documented above. CARDIOVASCULAR: No palpable pedal pulses right or left PULMONARY: There is good air exchange  MUSCULOSKELETAL: There are no major deformities or cyanosis. NEUROLOGIC: No focal weakness or paresthesias are detected. SKIN: There are no ulcers or rashes noted.  Blistering noted on her tip of her left great toe.  Dependent rubor present PSYCHIATRIC: The patient has a normal affect.  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  I explained that there is no option  for revascularization since she has no distal target.  I explained the only option is amputation and this would be at the below-knee level.  I explained that she should have adequate flow for healing a below-knee amputation but it is possible that this would be nonhealing as well.  They had multiple questions regarding options for bypass and I explained that with no distal target that this would not be possible.  She fortunately is having no infection or progressive tissue loss but is having very severe rest pain which is intolerable.  She is willing to proceed with amputation and will discuss this with the rest of her family and call us when she wishes to schedule this.    Larina Earthlyodd F. Deshay Kirstein, MD FACS [facility] Office Tel [phone] Pager [phone]

## 2017-06-30 DIAGNOSIS — I739 Peripheral vascular disease, unspecified: Secondary | ICD-10-CM | POA: Diagnosis not present

## 2017-07-01 ENCOUNTER — Encounter: Payer: Self-pay | Admitting: *Deleted

## 2017-07-01 ENCOUNTER — Telehealth: Payer: Self-pay | Admitting: *Deleted

## 2017-07-01 ENCOUNTER — Other Ambulatory Visit: Payer: Self-pay | Admitting: *Deleted

## 2017-07-01 NOTE — Progress Notes (Signed)
Medication clearance "Plavix hold x 5 days pre-op" per Inetta Fermoina for Dr. Doreen Beamhruv Vyas.

## 2017-07-01 NOTE — Telephone Encounter (Signed)
Call to patient's daughter/caregiver Clydie BraunKaren. Instructed to be at [facility] at 12:30 pm on 07/06/17 for surgery. To hold Plavix for surgery. Clear liquids MN prior to 9 am on day of surgery then NPO. Expect a call and follow the detailed instructions received from the hospital pre-admission department about this surgery. Instructed to speak with case mgr. on admission to start arranging discharge care. Verbalized understanding.

## 2017-07-05 ENCOUNTER — Encounter (HOSPITAL_COMMUNITY): Payer: Self-pay | Admitting: *Deleted

## 2017-07-05 ENCOUNTER — Other Ambulatory Visit: Payer: Self-pay

## 2017-07-05 NOTE — Progress Notes (Signed)
Spoke with pt's daughter, Vivien PrestoKaron Tucker for pre-op call. She states pt does not have a cardiac history. Pt did have a stroke many years ago, only lasting issue has been some difficulty swallowing. Pt is on Plavix, last dose was 07/01/17. Per Dr. Arbie CookeyEarly pt may have clear liquids until 9:00 AM Wednesday prior to surgery. [name] states pt is diabetic, took medications at one time but has not been on medications for "years". Pt does not check her sugar at home. Dr. Sherril CroonVyas does do an A1C on her routinely.

## 2017-07-06 ENCOUNTER — Inpatient Hospital Stay (HOSPITAL_COMMUNITY)
Admission: RE | Admit: 2017-07-06 | Discharge: 2017-07-10 | DRG: 240 | Disposition: A | Discharge status: Rehabilitation Facility | Payer: Medicare Other | Source: Ambulatory Visit | Attending: Vascular Surgery | Admitting: Vascular Surgery

## 2017-07-06 ENCOUNTER — Inpatient Hospital Stay (HOSPITAL_COMMUNITY): Payer: Medicare Other | Admitting: Certified Registered"

## 2017-07-06 ENCOUNTER — Encounter (HOSPITAL_COMMUNITY): Payer: Self-pay | Admitting: Anesthesiology

## 2017-07-06 ENCOUNTER — Encounter (HOSPITAL_COMMUNITY)
Admission: RE | Disposition: A | Discharge status: Rehabilitation Facility | Payer: Self-pay | Source: Ambulatory Visit | Attending: Vascular Surgery

## 2017-07-06 DIAGNOSIS — M19041 Primary osteoarthritis, right hand: Secondary | ICD-10-CM | POA: Diagnosis present

## 2017-07-06 DIAGNOSIS — Z8673 Personal history of transient ischemic attack (TIA), and cerebral infarction without residual deficits: Secondary | ICD-10-CM | POA: Diagnosis not present

## 2017-07-06 DIAGNOSIS — S88119D Complete traumatic amputation at level between knee and ankle, unspecified lower leg, subsequent encounter: Secondary | ICD-10-CM | POA: Diagnosis not present

## 2017-07-06 DIAGNOSIS — Z9849 Cataract extraction status, unspecified eye: Secondary | ICD-10-CM | POA: Diagnosis not present

## 2017-07-06 DIAGNOSIS — Z9841 Cataract extraction status, right eye: Secondary | ICD-10-CM | POA: Diagnosis not present

## 2017-07-06 DIAGNOSIS — I70222 Atherosclerosis of native arteries of extremities with rest pain, left leg: Secondary | ICD-10-CM | POA: Diagnosis not present

## 2017-07-06 DIAGNOSIS — J9811 Atelectasis: Secondary | ICD-10-CM | POA: Diagnosis not present

## 2017-07-06 DIAGNOSIS — G479 Sleep disorder, unspecified: Secondary | ICD-10-CM | POA: Diagnosis not present

## 2017-07-06 DIAGNOSIS — L89619 Pressure ulcer of right heel, unspecified stage: Secondary | ICD-10-CM | POA: Diagnosis not present

## 2017-07-06 DIAGNOSIS — E1151 Type 2 diabetes mellitus with diabetic peripheral angiopathy without gangrene: Secondary | ICD-10-CM | POA: Diagnosis not present

## 2017-07-06 DIAGNOSIS — E118 Type 2 diabetes mellitus with unspecified complications: Secondary | ICD-10-CM | POA: Diagnosis not present

## 2017-07-06 DIAGNOSIS — R339 Retention of urine, unspecified: Secondary | ICD-10-CM | POA: Diagnosis not present

## 2017-07-06 DIAGNOSIS — I70202 Unspecified atherosclerosis of native arteries of extremities, left leg: Secondary | ICD-10-CM | POA: Diagnosis not present

## 2017-07-06 DIAGNOSIS — Z6830 Body mass index (BMI) 30.0-30.9, adult: Secondary | ICD-10-CM | POA: Diagnosis not present

## 2017-07-06 DIAGNOSIS — H3530 Unspecified macular degeneration: Secondary | ICD-10-CM | POA: Diagnosis not present

## 2017-07-06 DIAGNOSIS — E639 Nutritional deficiency, unspecified: Secondary | ICD-10-CM | POA: Diagnosis not present

## 2017-07-06 DIAGNOSIS — R1310 Dysphagia, unspecified: Secondary | ICD-10-CM | POA: Diagnosis present

## 2017-07-06 DIAGNOSIS — I129 Hypertensive chronic kidney disease with stage 1 through stage 4 chronic kidney disease, or unspecified chronic kidney disease: Secondary | ICD-10-CM | POA: Diagnosis present

## 2017-07-06 DIAGNOSIS — Z89512 Acquired absence of left leg below knee: Secondary | ICD-10-CM | POA: Diagnosis not present

## 2017-07-06 DIAGNOSIS — S88112S Complete traumatic amputation at level between knee and ankle, left lower leg, sequela: Secondary | ICD-10-CM | POA: Diagnosis not present

## 2017-07-06 DIAGNOSIS — Z9842 Cataract extraction status, left eye: Secondary | ICD-10-CM | POA: Diagnosis not present

## 2017-07-06 DIAGNOSIS — Z4781 Encounter for orthopedic aftercare following surgical amputation: Secondary | ICD-10-CM | POA: Diagnosis present

## 2017-07-06 DIAGNOSIS — I739 Peripheral vascular disease, unspecified: Secondary | ICD-10-CM | POA: Diagnosis not present

## 2017-07-06 DIAGNOSIS — E1122 Type 2 diabetes mellitus with diabetic chronic kidney disease: Secondary | ICD-10-CM | POA: Diagnosis present

## 2017-07-06 DIAGNOSIS — I7025 Atherosclerosis of native arteries of other extremities with ulceration: Secondary | ICD-10-CM | POA: Diagnosis not present

## 2017-07-06 DIAGNOSIS — G546 Phantom limb syndrome with pain: Secondary | ICD-10-CM | POA: Diagnosis not present

## 2017-07-06 DIAGNOSIS — M19042 Primary osteoarthritis, left hand: Secondary | ICD-10-CM | POA: Diagnosis present

## 2017-07-06 DIAGNOSIS — S88112A Complete traumatic amputation at level between knee and ankle, left lower leg, initial encounter: Secondary | ICD-10-CM | POA: Diagnosis not present

## 2017-07-06 DIAGNOSIS — K5903 Drug induced constipation: Secondary | ICD-10-CM | POA: Diagnosis present

## 2017-07-06 DIAGNOSIS — N183 Chronic kidney disease, stage 3 (moderate): Secondary | ICD-10-CM | POA: Diagnosis not present

## 2017-07-06 DIAGNOSIS — E1169 Type 2 diabetes mellitus with other specified complication: Secondary | ICD-10-CM | POA: Diagnosis not present

## 2017-07-06 DIAGNOSIS — Z90710 Acquired absence of both cervix and uterus: Secondary | ICD-10-CM | POA: Diagnosis not present

## 2017-07-06 DIAGNOSIS — I998 Other disorder of circulatory system: Secondary | ICD-10-CM | POA: Diagnosis not present

## 2017-07-06 DIAGNOSIS — E876 Hypokalemia: Secondary | ICD-10-CM | POA: Diagnosis not present

## 2017-07-06 DIAGNOSIS — Z961 Presence of intraocular lens: Secondary | ICD-10-CM | POA: Diagnosis present

## 2017-07-06 DIAGNOSIS — Z7902 Long term (current) use of antithrombotics/antiplatelets: Secondary | ICD-10-CM

## 2017-07-06 DIAGNOSIS — L89613 Pressure ulcer of right heel, stage 3: Secondary | ICD-10-CM | POA: Diagnosis not present

## 2017-07-06 DIAGNOSIS — Z888 Allergy status to other drugs, medicaments and biological substances status: Secondary | ICD-10-CM | POA: Diagnosis not present

## 2017-07-06 DIAGNOSIS — I10 Essential (primary) hypertension: Secondary | ICD-10-CM | POA: Diagnosis not present

## 2017-07-06 DIAGNOSIS — K5901 Slow transit constipation: Secondary | ICD-10-CM | POA: Diagnosis not present

## 2017-07-06 DIAGNOSIS — G8918 Other acute postprocedural pain: Secondary | ICD-10-CM | POA: Diagnosis not present

## 2017-07-06 DIAGNOSIS — M79672 Pain in left foot: Secondary | ICD-10-CM | POA: Diagnosis not present

## 2017-07-06 DIAGNOSIS — M62262 Nontraumatic ischemic infarction of muscle, left lower leg: Secondary | ICD-10-CM | POA: Diagnosis not present

## 2017-07-06 DIAGNOSIS — I69391 Dysphagia following cerebral infarction: Secondary | ICD-10-CM | POA: Diagnosis not present

## 2017-07-06 DIAGNOSIS — E669 Obesity, unspecified: Secondary | ICD-10-CM | POA: Diagnosis present

## 2017-07-06 DIAGNOSIS — N390 Urinary tract infection, site not specified: Secondary | ICD-10-CM | POA: Diagnosis not present

## 2017-07-06 HISTORY — DX: Type 2 diabetes mellitus without complications: E11.9

## 2017-07-06 HISTORY — PX: AMPUTATION: SHX166

## 2017-07-06 HISTORY — DX: Pneumonia, unspecified organism: J18.9

## 2017-07-06 HISTORY — DX: Unspecified macular degeneration: H35.30

## 2017-07-06 HISTORY — DX: Unspecified osteoarthritis, unspecified site: M19.90

## 2017-07-06 HISTORY — DX: Adverse effect of unspecified anesthetic, initial encounter: T41.45XA

## 2017-07-06 HISTORY — DX: Constipation, unspecified: K59.00

## 2017-07-06 HISTORY — DX: Other complications of anesthesia, initial encounter: T88.59XA

## 2017-07-06 LAB — CBC
HCT: 42.9 % (ref 36.0–46.0)
Hemoglobin: 14.5 g/dL (ref 12.0–15.0)
MCH: 29.8 pg (ref 26.0–34.0)
MCHC: 33.8 g/dL (ref 30.0–36.0)
MCV: 88.1 fL (ref 78.0–100.0)
Platelets: 224 10*3/uL (ref 150–400)
RBC: 4.87 MIL/uL (ref 3.87–5.11)
RDW: 13.5 % (ref 11.5–15.5)
WBC: 7.7 10*3/uL (ref 4.0–10.5)

## 2017-07-06 LAB — COMPREHENSIVE METABOLIC PANEL
ALT: 22 U/L (ref 14–54)
AST: 28 U/L (ref 15–41)
Albumin: 4.1 g/dL (ref 3.5–5.0)
Alkaline Phosphatase: 65 U/L (ref 38–126)
Anion gap: 16 — ABNORMAL HIGH (ref 5–15)
BUN: 17 mg/dL (ref 6–20)
CO2: 22 mmol/L (ref 22–32)
Calcium: 9.3 mg/dL (ref 8.9–10.3)
Chloride: 98 mmol/L — ABNORMAL LOW (ref 101–111)
Creatinine, Ser: 1.73 mg/dL — ABNORMAL HIGH (ref 0.44–1.00)
GFR calc Af Amer: 29 mL/min — ABNORMAL LOW (ref >60)
GFR calc non Af Amer: 25 mL/min — ABNORMAL LOW (ref >60)
Glucose, Bld: 86 mg/dL (ref 65–99)
Potassium: 3.5 mmol/L (ref 3.5–5.1)
Sodium: 136 mmol/L (ref 135–145)
Total Bilirubin: 0.9 mg/dL (ref 0.3–1.2)
Total Protein: 6.9 g/dL (ref 6.5–8.1)

## 2017-07-06 LAB — GLUCOSE, CAPILLARY
Glucose-Capillary: 80 mg/dL (ref 65–99)
Glucose-Capillary: 124 mg/dL — ABNORMAL HIGH (ref 65–99)

## 2017-07-06 LAB — HEMOGLOBIN A1C
Hgb A1c MFr Bld: 6 % — ABNORMAL HIGH (ref 4.8–5.6)
Mean Plasma Glucose: 125.5 mg/dL

## 2017-07-06 SURGERY — AMPUTATION BELOW KNEE
Anesthesia: General | Site: Leg Lower | Laterality: Left

## 2017-07-06 MED ORDER — PANTOPRAZOLE SODIUM 40 MG PO TBEC
40.0000 mg | DELAYED_RELEASE_TABLET | Freq: Every day | ORAL | Status: DC
  Administered 2017-07-07 – 2017-07-10 (×4): 40 mg via ORAL
  Filled 2017-07-06 (×4): qty 1

## 2017-07-06 MED ORDER — OXYCODONE-ACETAMINOPHEN 5-325 MG PO TABS
ORAL_TABLET | ORAL | Status: AC
  Filled 2017-07-06: qty 2

## 2017-07-06 MED ORDER — SODIUM CHLORIDE 0.9 % IV SOLN: INTRAVENOUS | Status: DC

## 2017-07-06 MED ORDER — MORPHINE SULFATE (PF) 2 MG/ML IV SOLN
0.5000 mg | INTRAVENOUS | Status: DC | PRN
  Administered 2017-07-06 – 2017-07-07 (×2): 1 mg via INTRAVENOUS
  Filled 2017-07-06 (×2): qty 1

## 2017-07-06 MED ORDER — SENNOSIDES-DOCUSATE SODIUM 8.6-50 MG PO TABS: 1.0000 | ORAL_TABLET | Freq: Every evening | ORAL | Status: DC | PRN

## 2017-07-06 MED ORDER — CEFAZOLIN SODIUM-DEXTROSE 2-4 GM/100ML-% IV SOLN
INTRAVENOUS | Status: AC
  Filled 2017-07-06: qty 100

## 2017-07-06 MED ORDER — FENTANYL CITRATE (PF) 100 MCG/2ML IJ SOLN
INTRAMUSCULAR | Status: DC | PRN
  Administered 2017-07-06 (×2): 50 ug via INTRAVENOUS
  Administered 2017-07-06: 100 ug via INTRAVENOUS
  Administered 2017-07-06: 50 ug via INTRAVENOUS

## 2017-07-06 MED ORDER — POTASSIUM CHLORIDE CRYS ER 20 MEQ PO TBCR: 20.0000 meq | EXTENDED_RELEASE_TABLET | Freq: Every day | ORAL | Status: DC | PRN

## 2017-07-06 MED ORDER — CEFAZOLIN SODIUM-DEXTROSE 2-4 GM/100ML-% IV SOLN
2.0000 g | INTRAVENOUS | Status: AC
  Administered 2017-07-06: 2 g via INTRAVENOUS

## 2017-07-06 MED ORDER — SUCCINYLCHOLINE CHLORIDE 200 MG/10ML IV SOSY
PREFILLED_SYRINGE | INTRAVENOUS | Status: DC | PRN
  Administered 2017-07-06: 100 mg via INTRAVENOUS

## 2017-07-06 MED ORDER — CLOPIDOGREL BISULFATE 75 MG PO TABS
75.0000 mg | ORAL_TABLET | Freq: Every day | ORAL | Status: DC
  Administered 2017-07-07 – 2017-07-10 (×4): 75 mg via ORAL
  Filled 2017-07-06 (×5): qty 1

## 2017-07-06 MED ORDER — GLYCOPYRROLATE 0.2 MG/ML IV SOSY
PREFILLED_SYRINGE | INTRAVENOUS | Status: DC | PRN
  Administered 2017-07-06: .2 mg via INTRAVENOUS

## 2017-07-06 MED ORDER — HYDRALAZINE HCL 20 MG/ML IJ SOLN: 5.0000 mg | INTRAMUSCULAR | Status: DC | PRN

## 2017-07-06 MED ORDER — OXYCODONE-ACETAMINOPHEN 5-325 MG PO TABS
1.0000 | ORAL_TABLET | ORAL | Status: DC | PRN
  Administered 2017-07-06 – 2017-07-07 (×2): 2 via ORAL
  Administered 2017-07-08 – 2017-07-09 (×3): 1 via ORAL
  Administered 2017-07-09: 2 via ORAL
  Administered 2017-07-09 – 2017-07-10 (×4): 1 via ORAL
  Administered 2017-07-10: 2 via ORAL
  Filled 2017-07-06 (×5): qty 1
  Filled 2017-07-06 (×4): qty 2
  Filled 2017-07-06: qty 1
  Filled 2017-07-06: qty 2

## 2017-07-06 MED ORDER — PROPOFOL 10 MG/ML IV BOLUS
INTRAVENOUS | Status: DC | PRN
  Administered 2017-07-06: 30 mg via INTRAVENOUS
  Administered 2017-07-06: 140 mg via INTRAVENOUS
  Administered 2017-07-06: 30 mg via INTRAVENOUS

## 2017-07-06 MED ORDER — ENOXAPARIN SODIUM 30 MG/0.3ML ~~LOC~~ SOLN
30.0000 mg | SUBCUTANEOUS | Status: DC
  Administered 2017-07-07: 30 mg via SUBCUTANEOUS
  Filled 2017-07-06: qty 0.3

## 2017-07-06 MED ORDER — SODIUM CHLORIDE 0.9 % IV SOLN
INTRAVENOUS | Status: DC
  Administered 2017-07-06 – 2017-07-08 (×4): via INTRAVENOUS

## 2017-07-06 MED ORDER — 0.9 % SODIUM CHLORIDE (POUR BTL) OPTIME
TOPICAL | Status: DC | PRN
  Administered 2017-07-06: 1000 mL

## 2017-07-06 MED ORDER — LOSARTAN POTASSIUM-HCTZ 50-12.5 MG PO TABS: 1.0000 | ORAL_TABLET | Freq: Two times a day (BID) | ORAL | Status: DC

## 2017-07-06 MED ORDER — FENTANYL CITRATE (PF) 100 MCG/2ML IJ SOLN
INTRAMUSCULAR | Status: AC
  Filled 2017-07-06: qty 2

## 2017-07-06 MED ORDER — PHENOL 1.4 % MT LIQD: 1.0000 | OROMUCOSAL | Status: DC | PRN

## 2017-07-06 MED ORDER — GUAIFENESIN-DM 100-10 MG/5ML PO SYRP: 15.0000 mL | ORAL_SOLUTION | ORAL | Status: DC | PRN

## 2017-07-06 MED ORDER — ONDANSETRON HCL 4 MG/2ML IJ SOLN
INTRAMUSCULAR | Status: DC | PRN
Start: 2017-07-06 — End: 2017-07-06
  Administered 2017-07-06: 4 mg via INTRAVENOUS

## 2017-07-06 MED ORDER — FENTANYL CITRATE (PF) 100 MCG/2ML IJ SOLN
25.0000 ug | INTRAMUSCULAR | Status: DC | PRN
  Administered 2017-07-06 (×3): 50 ug via INTRAVENOUS

## 2017-07-06 MED ORDER — FENTANYL CITRATE (PF) 250 MCG/5ML IJ SOLN
INTRAMUSCULAR | Status: AC
  Filled 2017-07-06: qty 5

## 2017-07-06 MED ORDER — ALUM & MAG HYDROXIDE-SIMETH 200-200-20 MG/5ML PO SUSP: 15.0000 mL | ORAL | Status: DC | PRN

## 2017-07-06 MED ORDER — HYDROCHLOROTHIAZIDE 12.5 MG PO CAPS
12.5000 mg | ORAL_CAPSULE | Freq: Two times a day (BID) | ORAL | Status: DC
  Administered 2017-07-07 – 2017-07-10 (×7): 12.5 mg via ORAL
  Filled 2017-07-06 (×8): qty 1

## 2017-07-06 MED ORDER — LABETALOL HCL 5 MG/ML IV SOLN
INTRAVENOUS | Status: AC
  Filled 2017-07-06: qty 4

## 2017-07-06 MED ORDER — HYDROMORPHONE HCL 1 MG/ML IJ SOLN
INTRAMUSCULAR | Status: AC
  Filled 2017-07-06: qty 0.5

## 2017-07-06 MED ORDER — CHLORHEXIDINE GLUCONATE 4 % EX LIQD: 60.0000 mL | Freq: Once | CUTANEOUS | Status: DC

## 2017-07-06 MED ORDER — LACTATED RINGERS IV SOLN
INTRAVENOUS | Status: DC
  Administered 2017-07-06: 50 mL/h via INTRAVENOUS

## 2017-07-06 MED ORDER — DIPHENHYDRAMINE HCL 12.5 MG/5ML PO ELIX
12.5000 mg | ORAL_SOLUTION | Freq: Four times a day (QID) | ORAL | Status: DC | PRN
  Filled 2017-07-06: qty 5

## 2017-07-06 MED ORDER — HYDROMORPHONE HCL 1 MG/ML IJ SOLN
INTRAMUSCULAR | Status: DC | PRN
  Administered 2017-07-06: 0.5 mg via INTRAVENOUS

## 2017-07-06 MED ORDER — ESMOLOL HCL 100 MG/10ML IV SOLN
INTRAVENOUS | Status: AC
  Filled 2017-07-06: qty 20

## 2017-07-06 MED ORDER — LIDOCAINE HCL (CARDIAC) 20 MG/ML IV SOLN
INTRAVENOUS | Status: DC | PRN
  Administered 2017-07-06: 60 mg via INTRAVENOUS

## 2017-07-06 MED ORDER — LABETALOL HCL 5 MG/ML IV SOLN
INTRAVENOUS | Status: DC | PRN
  Administered 2017-07-06 (×2): 5 mg via INTRAVENOUS

## 2017-07-06 MED ORDER — NALOXONE HCL 0.4 MG/ML IJ SOLN: 0.4000 mg | INTRAMUSCULAR | Status: DC | PRN

## 2017-07-06 MED ORDER — METOPROLOL TARTRATE 5 MG/5ML IV SOLN
INTRAVENOUS | Status: DC | PRN
  Administered 2017-07-06 (×3): 1 mg via INTRAVENOUS

## 2017-07-06 MED ORDER — ONDANSETRON HCL 4 MG/2ML IJ SOLN
INTRAMUSCULAR | Status: AC
  Filled 2017-07-06: qty 2

## 2017-07-06 MED ORDER — PHENYLEPHRINE 40 MCG/ML (10ML) SYRINGE FOR IV PUSH (FOR BLOOD PRESSURE SUPPORT)
PREFILLED_SYRINGE | INTRAVENOUS | Status: DC | PRN
  Administered 2017-07-06: 80 ug via INTRAVENOUS

## 2017-07-06 MED ORDER — WHITE PETROLATUM EX OINT
TOPICAL_OINTMENT | CUTANEOUS | Status: AC
  Filled 2017-07-06: qty 28.35

## 2017-07-06 MED ORDER — AMLODIPINE BESYLATE 10 MG PO TABS
10.0000 mg | ORAL_TABLET | Freq: Every day | ORAL | Status: DC
  Administered 2017-07-07 – 2017-07-10 (×4): 10 mg via ORAL
  Filled 2017-07-06 (×4): qty 1

## 2017-07-06 MED ORDER — DOCUSATE SODIUM 100 MG PO CAPS
100.0000 mg | ORAL_CAPSULE | Freq: Every day | ORAL | Status: DC
  Administered 2017-07-07 – 2017-07-10 (×3): 100 mg via ORAL
  Filled 2017-07-06 (×4): qty 1

## 2017-07-06 MED ORDER — ACETAMINOPHEN 325 MG PO TABS: 325.0000 mg | ORAL_TABLET | Freq: Four times a day (QID) | ORAL | Status: DC | PRN

## 2017-07-06 MED ORDER — BISACODYL 5 MG PO TBEC
5.0000 mg | DELAYED_RELEASE_TABLET | Freq: Every day | ORAL | Status: DC | PRN
  Administered 2017-07-10: 5 mg via ORAL
  Filled 2017-07-06: qty 1

## 2017-07-06 MED ORDER — CEFAZOLIN SODIUM-DEXTROSE 2-4 GM/100ML-% IV SOLN
2.0000 g | Freq: Two times a day (BID) | INTRAVENOUS | Status: AC
  Administered 2017-07-07 (×2): 2 g via INTRAVENOUS
  Filled 2017-07-06 (×2): qty 100

## 2017-07-06 MED ORDER — LABETALOL HCL 5 MG/ML IV SOLN
10.0000 mg | INTRAVENOUS | Status: DC | PRN
  Administered 2017-07-06: 10 mg via INTRAVENOUS
  Filled 2017-07-06: qty 4

## 2017-07-06 MED ORDER — SODIUM CHLORIDE 0.9% FLUSH: 9.0000 mL | INTRAVENOUS | Status: DC | PRN

## 2017-07-06 MED ORDER — DIPHENHYDRAMINE HCL 50 MG/ML IJ SOLN: 12.5000 mg | Freq: Four times a day (QID) | INTRAMUSCULAR | Status: DC | PRN

## 2017-07-06 MED ORDER — ENOXAPARIN SODIUM 40 MG/0.4ML ~~LOC~~ SOLN: 40.0000 mg | SUBCUTANEOUS | Status: DC

## 2017-07-06 MED ORDER — METOPROLOL TARTRATE 5 MG/5ML IV SOLN: 2.0000 mg | INTRAVENOUS | Status: DC | PRN

## 2017-07-06 MED ORDER — MAGNESIUM SULFATE 2 GM/50ML IV SOLN: 2.0000 g | Freq: Every day | INTRAVENOUS | Status: DC | PRN

## 2017-07-06 MED ORDER — TRAMADOL HCL 50 MG PO TABS
50.0000 mg | ORAL_TABLET | ORAL | Status: DC | PRN
  Administered 2017-07-06: 100 mg via ORAL
  Filled 2017-07-06: qty 2

## 2017-07-06 MED ORDER — LOSARTAN POTASSIUM 50 MG PO TABS
50.0000 mg | ORAL_TABLET | Freq: Two times a day (BID) | ORAL | Status: DC
  Administered 2017-07-07 (×2): 50 mg via ORAL
  Filled 2017-07-06 (×3): qty 1

## 2017-07-06 MED ORDER — PROSIGHT PO TABS
1.0000 | ORAL_TABLET | Freq: Every day | ORAL | Status: DC
  Administered 2017-07-07 – 2017-07-10 (×4): 1 via ORAL
  Filled 2017-07-06 (×4): qty 1

## 2017-07-06 MED ORDER — MORPHINE SULFATE 2 MG/ML IV SOLN
INTRAVENOUS | Status: DC
  Administered 2017-07-06: 20:00:00 via INTRAVENOUS
  Administered 2017-07-07: 0 mg via INTRAVENOUS
  Administered 2017-07-07: 14:00:00 via INTRAVENOUS
  Administered 2017-07-07: 4.5 mg via INTRAVENOUS
  Administered 2017-07-07: 27 mg via INTRAVENOUS
  Administered 2017-07-07: 4.5 mg via INTRAVENOUS
  Administered 2017-07-07: 7.5 mg via INTRAVENOUS
  Administered 2017-07-08: 6 mg via INTRAVENOUS
  Filled 2017-07-06 (×2): qty 30

## 2017-07-06 MED ORDER — ONDANSETRON HCL 4 MG/2ML IJ SOLN: 4.0000 mg | Freq: Four times a day (QID) | INTRAMUSCULAR | Status: DC | PRN

## 2017-07-06 MED ORDER — ESMOLOL HCL 100 MG/10ML IV SOLN
INTRAVENOUS | Status: DC | PRN
  Administered 2017-07-06: 10 mg via INTRAVENOUS
  Administered 2017-07-06 (×4): 20 mg via INTRAVENOUS
  Administered 2017-07-06: 10 mg via INTRAVENOUS

## 2017-07-06 SURGICAL SUPPLY — 54 items
BANDAGE ACE 4X5 VEL STRL LF (GAUZE/BANDAGES/DRESSINGS) ×2 IMPLANT
BANDAGE ACE 6X5 VEL STRL LF (GAUZE/BANDAGES/DRESSINGS) IMPLANT
BANDAGE ELASTIC 6 VELCRO ST LF (GAUZE/BANDAGES/DRESSINGS) ×1 IMPLANT
BANDAGE ESMARK 6X9 LF (GAUZE/BANDAGES/DRESSINGS) IMPLANT
BLADE SAW GIGLI 510 (BLADE) ×2 IMPLANT
BNDG CMPR 9X6 STRL LF SNTH (GAUZE/BANDAGES/DRESSINGS)
BNDG COHESIVE 6X5 TAN STRL LF (GAUZE/BANDAGES/DRESSINGS) ×1 IMPLANT
BNDG ESMARK 6X9 LF (GAUZE/BANDAGES/DRESSINGS)
BNDG GAUZE ELAST 4 BULKY (GAUZE/BANDAGES/DRESSINGS) ×1 IMPLANT
CANISTER SUCT 3000ML PPV (MISCELLANEOUS) ×2 IMPLANT
CLIP LIGATING EXTRA MED SLVR (CLIP) ×2 IMPLANT
CLIP LIGATING EXTRA SM BLUE (MISCELLANEOUS) ×1 IMPLANT
COVER SURGICAL LIGHT HANDLE (MISCELLANEOUS) ×3 IMPLANT
CUFF TOURNIQUET SINGLE 34IN LL (TOURNIQUET CUFF) IMPLANT
CUFF TOURNIQUET SINGLE 44IN (TOURNIQUET CUFF) IMPLANT
DRAIN SNY 10X20 3/4 PERF (WOUND CARE) IMPLANT
DRAPE HALF SHEET 40X57 (DRAPES) ×2 IMPLANT
DRAPE ORTHO SPLIT 77X108 STRL (DRAPES) ×4
DRAPE SURG ORHT 6 SPLT 77X108 (DRAPES) ×2 IMPLANT
ELECT REM PT RETURN 9FT ADLT (ELECTROSURGICAL) ×2
ELECTRODE REM PT RTRN 9FT ADLT (ELECTROSURGICAL) ×1 IMPLANT
EVACUATOR SILICONE 100CC (DRAIN) IMPLANT
GAUZE SPONGE 4X4 12PLY STRL (GAUZE/BANDAGES/DRESSINGS) ×1 IMPLANT
GAUZE SPONGE 4X4 12PLY STRL LF (GAUZE/BANDAGES/DRESSINGS) ×1 IMPLANT
GAUZE XEROFORM 5X9 LF (GAUZE/BANDAGES/DRESSINGS) ×2 IMPLANT
GLOVE BIOGEL M 6.5 STRL (GLOVE) ×3 IMPLANT
GLOVE BIOGEL PI IND STRL 6.5 (GLOVE) IMPLANT
GLOVE BIOGEL PI IND STRL 7.0 (GLOVE) IMPLANT
GLOVE BIOGEL PI INDICATOR 6.5 (GLOVE) ×2
GLOVE BIOGEL PI INDICATOR 7.0 (GLOVE) ×1
GLOVE ECLIPSE 6.5 STRL STRAW (GLOVE) ×1 IMPLANT
GLOVE SS BIOGEL STRL SZ 7.5 (GLOVE) ×1 IMPLANT
GLOVE SUPERSENSE BIOGEL SZ 7.5 (GLOVE) ×1
GLOVE SURG SS PI 6.5 STRL IVOR (GLOVE) ×1 IMPLANT
GOWN STRL NON-REIN LRG LVL3 (GOWN DISPOSABLE) ×1 IMPLANT
GOWN STRL REUS W/ TWL LRG LVL3 (GOWN DISPOSABLE) ×3 IMPLANT
GOWN STRL REUS W/TWL LRG LVL3 (GOWN DISPOSABLE) ×6
KIT BASIN OR (CUSTOM PROCEDURE TRAY) ×2 IMPLANT
KIT ROOM TURNOVER OR (KITS) ×2 IMPLANT
NS IRRIG 1000ML POUR BTL (IV SOLUTION) ×2 IMPLANT
PACK GENERAL/GYN (CUSTOM PROCEDURE TRAY) ×2 IMPLANT
PAD ARMBOARD 7.5X6 YLW CONV (MISCELLANEOUS) ×4 IMPLANT
PADDING CAST COTTON 6X4 STRL (CAST SUPPLIES) IMPLANT
STAPLER VISISTAT 35W (STAPLE) ×2 IMPLANT
STOCKINETTE IMPERVIOUS LG (DRAPES) ×2 IMPLANT
SUT ETHILON 3 0 PS 1 (SUTURE) IMPLANT
SUT SILK 3 0 (SUTURE) ×2
SUT SILK 3-0 18XBRD TIE 12 (SUTURE) IMPLANT
SUT VIC AB 0 CT1 18XCR BRD 8 (SUTURE) ×2 IMPLANT
SUT VIC AB 0 CT1 8-18 (SUTURE) ×6
SUT VICRYL AB 2 0 TIES (SUTURE) ×2 IMPLANT
TOWEL GREEN STERILE (TOWEL DISPOSABLE) ×4 IMPLANT
UNDERPAD 30X30 (UNDERPADS AND DIAPERS) ×2 IMPLANT
WATER STERILE IRR 1000ML POUR (IV SOLUTION) ×2 IMPLANT

## 2017-07-06 NOTE — Progress Notes (Signed)
Patient received from PACU, A&Ox3, VSS. Telemetry applied and CCMD notified. Patient oriented to room and call light placed within reach. Nursing will continue to monitor.

## 2017-07-06 NOTE — Interval H&P Note (Signed)
History and Physical Interval Note:  07/06/2017 2:45 PM  [name] Petersen  has presented today for surgery, with the diagnosis of NON-VIABLE TISSUE LEFT LOWER EXTREMITY  The various methods of treatment have been discussed with the patient and family. After consideration of risks, benefits and other options for treatment, the patient has consented to  Procedure(s): AMPUTATION BELOW KNEE LEFT (Left) as a surgical intervention .  The patient's history has been reviewed, patient examined, no change in status, stable for surgery.  I have reviewed the patient's chart and labs.  Questions were answered to the patient's satisfaction.     [name]

## 2017-07-06 NOTE — Progress Notes (Signed)
Patient still complaining of excruciating pain despite receiving morphine and tramadol. MD on call notified and orders received for Morphine PCA. Family at bed side and updated. Nursing will continue to monitor.

## 2017-07-06 NOTE — Anesthesia Preprocedure Evaluation (Addendum)
Anesthesia Evaluation  Patient identified by MRN, date of birth, ID band  Reviewed: Allergy & Precautions, NPO status , Patient's Chart, lab work & pertinent test results  Airway Mallampati: II  TM Distance: >3 FB     Dental   Pulmonary pneumonia,    breath sounds clear to auscultation       Cardiovascular hypertension, + Peripheral Vascular Disease   Rhythm:Regular Rate:Normal     Neuro/Psych    GI/Hepatic negative GI ROS, Neg liver ROS,   Endo/Other  diabetes  Renal/GU negative Renal ROS     Musculoskeletal  (+) Arthritis ,   Abdominal   Peds  Hematology   Anesthesia Other Findings   Reproductive/Obstetrics                             Anesthesia Physical Anesthesia Plan  ASA: II  Anesthesia Plan: General   Post-op Pain Management:    Induction: Intravenous  PONV Risk Score and Plan: 3 and Treatment may vary due to age or medical condition, Midazolam, Ondansetron and Dexamethasone  Airway Management Planned: Oral ETT  Additional Equipment:   Intra-op Plan:   Post-operative Plan: Extubation in OR  Informed Consent: I have reviewed the patients History and Physical, chart, labs and discussed the procedure including the risks, benefits and alternatives for the proposed anesthesia with the patient or authorized representative who has indicated his/her understanding and acceptance.   Dental advisory given  Plan Discussed with: CRNA and Anesthesiologist  Anesthesia Plan Comments:         Anesthesia Quick Evaluation

## 2017-07-06 NOTE — Anesthesia Postprocedure Evaluation (Signed)
Anesthesia Post Note  Patient: [name] Petersen  Procedure(s) Performed: AMPUTATION BELOW KNEE LEFT (Left Leg Lower)     Patient location during evaluation: PACU Anesthesia Type: General Level of consciousness: awake Pain management: pain level controlled Vital Signs Assessment: post-procedure vital signs reviewed and stable Respiratory status: spontaneous breathing Cardiovascular status: stable Anesthetic complications: no    Last Vitals:  Vitals:   07/06/17 1704 07/06/17 1719  BP: (!) 142/77 130/66  Pulse: 83 78  Resp: (!) 6 (!) 9  Temp: 36.7 C   SpO2: 95% 93%    Last Pain:  Vitals:   07/06/17 1723  TempSrc:   PainSc: 8                  Ziyana Morikawa

## 2017-07-06 NOTE — Transfer of Care (Signed)
Immediate Anesthesia Transfer of Care Note  Patient: [name] Petersen  Procedure(s) Performed: AMPUTATION BELOW KNEE LEFT (Left Leg Lower)  Patient Location: PACU  Anesthesia Type:General  Level of Consciousness: awake, alert  and oriented  Airway & Oxygen Therapy: Patient Spontanous Breathing and Patient connected to face mask oxygen  Post-op Assessment: Report given to RN and Post -op Vital signs reviewed and stable  Post vital signs: Reviewed and stable  Last Vitals:  Vitals:   07/06/17 1332 07/06/17 1704  BP: (!) 179/79 (!) 142/77  Pulse: 75 83  Resp: 16 (!) 6  Temp: 37.2 C 36.7 C  SpO2: 97% 95%    Last Pain:  Vitals:   07/06/17 1704  TempSrc:   PainSc: (P) 8       Patients Stated Pain Goal: 3 (07/06/17 1351)  Complications: No apparent anesthesia complications

## 2017-07-06 NOTE — Plan of Care (Signed)
  Progressing Education: Knowledge of General Education information will improve 07/06/2017 2332 - Progressing by Renelda MomWorley, Ravynn Hogate L, RN Health Behavior/Discharge Planning: Ability to manage health-related needs will improve 07/06/2017 2332 - Progressing by Renelda MomWorley, Shadiyah Wernli L, RN Clinical Measurements: Ability to maintain clinical measurements within normal limits will improve 07/06/2017 2332 - Progressing by Renelda MomWorley, Taran Hable L, RN Will remain free from infection 07/06/2017 2332 - Progressing by Renelda MomWorley, Jaziya Obarr L, RN

## 2017-07-06 NOTE — Op Note (Signed)
    OPERATIVE REPORT  DATE OF SURGERY: 07/06/2017  PATIENT: [name] Petersen, 84 y.o. female MRN: [id number]  DOB: 02/04/1927  PRE-OPERATIVE DIAGNOSIS: Critical limb ischemia with unreconstructable distal tibial disease  POST-OPERATIVE DIAGNOSIS:  Same  PROCEDURE: Left below-knee amputation  SURGEON:  Gretta Beganodd Lonald Troiani, M.D.  PHYSICIAN ASSISTANT: Nurse  ANESTHESIA: General  EBL: 50 ml  Total I/O In: 1000 [I.V.:1000] Out: 50 [Blood:50]  BLOOD ADMINISTERED: None  DRAINS: None  SPECIMEN: Left BKA  COUNTS CORRECT:  YES  PLAN OF CARE: PACU  PATIENT DISPOSITION:  PACU - hemodynamically stable  PROCEDURE DETAILS: The patient was taken to the operative placed supine position where the area of the left leg was prepped and draped you sterile fashion.  A incision was made with a posterior-based gastrocnemius flap several centimeters below the tibial prominence.  This was carried down through the anterior tibial muscle bodies.  The vessels were occluded with hemostats and divided.  The anterior tibial posterior tibial and peroneal arteries were ligated and divided.  The soleus muscle was divided in line with the anterior incision and the posterior flap was included with the gastrocnemius muscle.  The periosteum was elevated off the tibia and fibula.  The tibia was divided with bone shears and the fibula was divided with Gigli saw.  Bone edges were smoothed with a bone rasp.  The wound was irrigated with saline.  Hemostasis elect cautery.  The wounds were closed with interrupted figure-of-eight Vicryl sutures to close the anterior fascia to the posterior fascia.  Again was closed with skin staples.  Sterile dressing was applied and the patient was transferred to the recovery room in stable condition   [name] Petersen, M.D., [facility] 07/06/2017 5:19 PM

## 2017-07-06 NOTE — Anesthesia Procedure Notes (Signed)
Procedure Name: Intubation Date/Time: 07/06/2017 3:34 PM Performed by: Gwyndolyn Saxon, CRNA Pre-anesthesia Checklist: Patient identified, Emergency Drugs available, Suction available, Patient being monitored and Timeout performed Patient Re-evaluated:Patient Re-evaluated prior to induction Oxygen Delivery Method: Circle system utilized Preoxygenation: Pre-oxygenation with 100% oxygen Induction Type: IV induction Ventilation: Mask ventilation without difficulty Laryngoscope Size: Mac and 3 Grade View: Grade I Tube type: Oral Tube size: 7.0 mm Number of attempts: 1 Placement Confirmation: ETT inserted through vocal cords under direct vision,  positive ETCO2,  CO2 detector and breath sounds checked- equal and bilateral Secured at: 20 cm Tube secured with: Tape Dental Injury: Teeth and Oropharynx as per pre-operative assessment

## 2017-07-07 ENCOUNTER — Encounter (HOSPITAL_COMMUNITY): Payer: Self-pay | Admitting: Vascular Surgery

## 2017-07-07 ENCOUNTER — Other Ambulatory Visit: Payer: Self-pay

## 2017-07-07 DIAGNOSIS — I739 Peripheral vascular disease, unspecified: Secondary | ICD-10-CM

## 2017-07-07 DIAGNOSIS — Z89512 Acquired absence of left leg below knee: Secondary | ICD-10-CM

## 2017-07-07 LAB — CBC
HCT: 39.8 % (ref 36.0–46.0)
Hemoglobin: 13 g/dL (ref 12.0–15.0)
MCH: 29.5 pg (ref 26.0–34.0)
MCHC: 32.7 g/dL (ref 30.0–36.0)
MCV: 90.2 fL (ref 78.0–100.0)
Platelets: 199 10*3/uL (ref 150–400)
RBC: 4.41 MIL/uL (ref 3.87–5.11)
RDW: 13.7 % (ref 11.5–15.5)
WBC: 8.3 10*3/uL (ref 4.0–10.5)

## 2017-07-07 LAB — BASIC METABOLIC PANEL
Anion gap: 12 (ref 5–15)
BUN: 16 mg/dL (ref 6–20)
CO2: 22 mmol/L (ref 22–32)
Calcium: 8.2 mg/dL — ABNORMAL LOW (ref 8.9–10.3)
Chloride: 100 mmol/L — ABNORMAL LOW (ref 101–111)
Creatinine, Ser: 1.69 mg/dL — ABNORMAL HIGH (ref 0.44–1.00)
GFR calc Af Amer: 30 mL/min — ABNORMAL LOW (ref >60)
GFR calc non Af Amer: 26 mL/min — ABNORMAL LOW (ref >60)
Glucose, Bld: 124 mg/dL — ABNORMAL HIGH (ref 65–99)
Potassium: 3.9 mmol/L (ref 3.5–5.1)
Sodium: 134 mmol/L — ABNORMAL LOW (ref 135–145)

## 2017-07-07 NOTE — Evaluation (Signed)
Physical Therapy Evaluation Patient Details Name: [name] Petersen MRN: [id number] DOB: 1926/08/06 Today's Date: 07/07/2017   History of Present Illness  Pt is an 84 y.o. 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 PMH includes DM, HTN, PVD, PNA, arthritis, CVA, macular degeneration.    Clinical Impression  Pt presents with an overall decrease in functional mobility secondary to above. PTA, pt mod indep with RW and lives alone; family can provide 24/7 support upon return home. Pt with decreased cognition and increased lethargy, suspect due to pain medications. Required mod-totalA for bed mobility and sup<>sit; reliant on BUE support to maintain seated EOB balance. Further mobility limited by pain and lethargy. Pt motivated to return to living as independently as possible. Pt would benefit from continued acute PT services to maximize functional mobility and independence prior to d/c with CIR-level therapies.     Follow Up Recommendations CIR;Supervision/Assistance - 24 hour    Equipment Recommendations  (TBD next venue)    Recommendations for Other Services Rehab consult     Precautions / Restrictions Precautions Precautions: Fall      Mobility  Bed Mobility Overal bed mobility: Needs Assistance Bed Mobility: Rolling;Supine to Sit;Sit to Supine Rolling: Mod assist;Max assist   Supine to sit: Max assist;+2 for physical assistance Sit to supine: Max assist;+2 for physical assistance   General bed mobility comments: ModA to roll to L-side, maxA to roll to R-side; cues to use rail to self assist with rolling. MaxA for sit<>supine and use of bed pad to position hips at EOB  Transfers                 General transfer comment: Deferred secondary to pain and lethargy  Ambulation/Gait                Stairs            Wheelchair Mobility    Modified Rankin (Stroke Patients Only)       Balance Overall balance assessment: Needs  assistance Sitting-balance support: Bilateral upper extremity supported Sitting balance-Leahy Scale: Poor Sitting balance - Comments: Reliant on BUEs to maintain seated balance and intermittent min guard; mod-maxA for seated balance with no UE support Postural control: Posterior lean                                   Pertinent Vitals/Pain Pain Assessment: Faces Faces Pain Scale: Hurts even more Pain Location: L LE Pain Descriptors / Indicators: Grimacing;Guarding;Moaning Pain Intervention(s): Monitored during session;Repositioned    Home Living Family/patient expects to be discharged to:: Private residence Living Arrangements: Alone Available Help at Discharge: Family;Available 24 hours/day Type of Home: Other(Comment)(Condo) Home Access: Stairs to enter(planning to have ramp built)   Entrance Stairs-Number of Steps: 2 Home Layout: One level Home Equipment: Grab bars - toilet;Grab bars - tub/shower;Shower seat;Walker - 2 wheels;Cane - single point      Prior Function Level of Independence: Independent with assistive device(s)         Comments: Has been using RW since January; mod indep with this and ADLs     Hand Dominance   Dominant Hand: Right    Extremity/Trunk Assessment   Upper Extremity Assessment Upper Extremity Assessment: Overall WFL for tasks assessed    Lower Extremity Assessment Lower Extremity Assessment: LLE deficits/detail LLE Deficits / Details: s/p L BKA; hip flex/ADD/ABD grossly 3/5 LLE: Unable to fully  assess due to pain       Communication   Communication: HOH  Cognition Arousal/Alertness: Lethargic;Suspect due to medications Behavior During Therapy: Anxious Overall Cognitive Status: Impaired/Different from baseline Area of Impairment: Following commands;Problem solving;Memory;Attention                   Current Attention Level: Focused Memory: Decreased short-term memory Following Commands: Follows one step  commands with increased time(and multimodal cues)     Problem Solving: Slow processing;Decreased initiation;Difficulty sequencing;Requires verbal cues;Requires tactile cues General Comments: Suspect apparent cognitive deficits secondary to PCA pump medication for pain as pt very lethargic      General Comments      Exercises     Assessment/Plan    PT Assessment Patient needs continued PT services  PT Problem List Decreased strength;Decreased range of motion;Decreased activity tolerance;Decreased balance;Decreased mobility;Decreased cognition;Decreased knowledge of use of DME;Decreased knowledge of precautions;Pain       PT Treatment Interventions DME instruction;Gait training;Functional mobility training;Therapeutic activities;Therapeutic exercise;Balance training;Patient/family education    PT Goals (Current goals can be found in the Care Plan section)  Acute Rehab PT Goals Patient Stated Goal: to return to independence and dance PT Goal Formulation: With patient Time For Goal Achievement: 07/21/17 Potential to Achieve Goals: Fair    Frequency Min 3X/week   Barriers to discharge        Co-evaluation PT/OT/SLP Co-Evaluation/Treatment: Yes Reason for Co-Treatment: For patient/therapist safety PT goals addressed during session: Mobility/safety with mobility;Balance OT goals addressed during session: ADL's and self-care       AM-PAC PT "6 Clicks" Daily Activity  Outcome Measure Difficulty turning over in bed (including adjusting bedclothes, sheets and blankets)?: Unable Difficulty moving from lying on back to sitting on the side of the bed? : Unable Difficulty sitting down on and standing up from a chair with arms (e.g., wheelchair, bedside commode, etc,.)?: Unable Help needed moving to and from a bed to chair (including a wheelchair)?: A Lot Help needed walking in hospital room?: Total Help needed climbing 3-5 steps with a railing? : Total 6 Click Score: 7    End  of Session   Activity Tolerance: Patient limited by lethargy Patient left: in bed;with call bell/phone within reach;with bed alarm set;with family/visitor present Nurse Communication: Mobility status PT Visit Diagnosis: Other abnormalities of gait and mobility (R26.89);Muscle weakness (generalized) (M62.81);Difficulty in walking, not elsewhere classified (R26.2)    Time: [id number] PT Time Calculation (min) (ACUTE ONLY): 39 min   Charges:   PT Evaluation $PT Eval Moderate Complexity: 1 Mod PT Treatments $Therapeutic Activity: 8-22 mins   PT G Codes:       Ina HomesJaclyn Breezie Micucci, PT, DPT Acute Rehab Services  Pager: [phone]  Malachy ChamberJaclyn L Ellie Bryand 07/07/2017, 2:08 PM

## 2017-07-07 NOTE — Progress Notes (Signed)
Patient daughter is encouraging patient to press the PCA delivery button even when patient is resting comfortably. RR 8-12. Etco2 44-49. PCA alarming continuously "Max dose and lock out"  Both patient and family re educated on PCA use and verbalized understating.Will continue to monitor closely

## 2017-07-07 NOTE — Evaluation (Signed)
Occupational Therapy Evaluation Patient Details Name: [name] Petersen MRN: [id number] DOB: 08/17/1926 Today's Date: 07/07/2017    History of Present Illness Pt is an 84 year old woman admitted with PAD and L LE ischemia, now s/p L BKA. PMH: DM, HTN, PVD, PNA, arthritis, CVA, macular degeneration.   Clinical Impression   Pt was using a walker since January and was modified independent in ADL and IADL. She presents with impaired cognition, likely due to pain medication. She requires 2 person assist for bed mobility and demonstrates poor sitting balance at EOB. Pt requires max to total assist for ADL. Will follow acutely. Recommending inpatient rehab for further therapy.     Follow Up Recommendations  CIR;Supervision/Assistance - 24 hour    Equipment Recommendations  Other (comment)(defer to next venue)    Recommendations for Other Services Rehab consult     Precautions / Restrictions Precautions Precautions: Fall      Mobility Bed Mobility Overal bed mobility: Needs Assistance Bed Mobility: Rolling;Supine to Sit;Sit to Supine Rolling: Max assist;Mod assist   Supine to sit: +2 for physical assistance;Max assist Sit to supine: +2 for physical assistance;Max assist   General bed mobility comments: cues to use rail to self assist with rolling, mod assist to roll L, max to roll R, assist for all aspects of sit<>supine and use of bed pad to position hips at EOB  Transfers                 General transfer comment: unable    Balance Overall balance assessment: Needs assistance Sitting-balance support: Bilateral upper extremity supported Sitting balance-Leahy Scale: Poor   Postural control: Posterior lean                                 ADL either performed or assessed with clinical judgement   ADL Overall ADL's : Needs assistance/impaired Eating/Feeding: Sitting;Maximal assistance   Grooming: Brushing hair;Sitting;Total assistance   Upper Body  Bathing: Total assistance;Bed level   Lower Body Bathing: Bed level;Total assistance   Upper Body Dressing : Bed level;Maximal assistance   Lower Body Dressing: Total assistance;Bed level       Toileting- Clothing Manipulation and Hygiene: Total assistance;Bed level               Vision Baseline Vision/History: Macular Degeneration Patient Visual Report: No change from baseline       Perception     Praxis      Pertinent Vitals/Pain Pain Assessment: Faces Faces Pain Scale: Hurts even more Pain Location: L LE Pain Descriptors / Indicators: Grimacing;Guarding Pain Intervention(s): Monitored during session     Hand Dominance Right   Extremity/Trunk Assessment Upper Extremity Assessment Upper Extremity Assessment: Overall WFL for tasks assessed   Lower Extremity Assessment Lower Extremity Assessment: Defer to PT evaluation       Communication Communication Communication: HOH   Cognition Arousal/Alertness: Awake/alert;Lethargic;Suspect due to medications Behavior During Therapy: Anxious Overall Cognitive Status: Impaired/Different from baseline Area of Impairment: Following commands;Problem solving;Memory;Attention                   Current Attention Level: Focused Memory: Decreased short-term memory Following Commands: Follows one step commands with increased time(and multimodal cues)     Problem Solving: Slow processing;Decreased initiation;Difficulty sequencing;Requires verbal cues;Requires tactile cues     General Comments       Exercises     Shoulder Instructions  Home Living Family/patient expects to be discharged to:: Private residence Living Arrangements: Alone Available Help at Discharge: Family;Available PRN/intermittently Type of Home: Other(Comment)(condo) Home Access: Stairs to enter Entrance Stairs-Number of Steps: 2   Home Layout: One level     Bathroom Shower/Tub: Producer, television/film/video: Standard      Home Equipment: Grab bars - toilet;Grab bars - tub/shower;Shower seat;Walker - 2 wheels;Cane - single point          Prior Functioning/Environment Level of Independence: Independent with assistive device(s)        Comments: walking with RW since January        OT Problem List: Decreased strength;Impaired balance (sitting and/or standing);Decreased activity tolerance;Decreased cognition;Decreased knowledge of use of DME or AE;Pain;Obesity      OT Treatment/Interventions: Self-care/ADL training;DME and/or AE instruction;Patient/family education;Balance training;Therapeutic activities;Cognitive remediation/compensation    OT Goals(Current goals can be found in the care plan section) Acute Rehab OT Goals Patient Stated Goal: to return to independence OT Goal Formulation: With patient Time For Goal Achievement: 07/21/17 Potential to Achieve Goals: Good  OT Frequency: Min 2X/week   Barriers to D/C:            Co-evaluation PT/OT/SLP Co-Evaluation/Treatment: Yes Reason for Co-Treatment: For patient/therapist safety   OT goals addressed during session: ADL's and self-care      AM-PAC PT "6 Clicks" Daily Activity     Outcome Measure Help from another person eating meals?: A Lot Help from another person taking care of personal grooming?: A Lot Help from another person toileting, which includes using toliet, bedpan, or urinal?: Total Help from another person bathing (including washing, rinsing, drying)?: Total Help from another person to put on and taking off regular upper body clothing?: A Lot Help from another person to put on and taking off regular lower body clothing?: Total 6 Click Score: 9   End of Session Nurse Communication: Mobility status(needs purewick)  Activity Tolerance: Patient tolerated treatment well(VSS) Patient left: in bed;with call bell/phone within reach;with bed alarm set;with family/visitor present  OT Visit Diagnosis: Pain;Muscle weakness  (generalized) (M62.81);Other symptoms and signs involving cognitive function Pain - Right/Left: Left Pain - part of body: Leg                Time: 1610-9604 OT Time Calculation (min): 40 min Charges:  OT General Charges $OT Visit: 1 Visit OT Evaluation $OT Eval Moderate Complexity: 1 Mod G-Codes:     07-15-17 Martie Round, OTR/L Pager: [phone]  Iran Planas, [name] Petersen 15-Jul-2017, 1:56 PM

## 2017-07-07 NOTE — Progress Notes (Addendum)
  Progress Note    07/07/2017 7:28 AM 1 Day Post-Op  Subjective:  Sleeping soundly  Afebrile  Vitals:   07/07/17 0400 07/07/17 0446  BP: (!) 121/55   Pulse:    Resp: (!) 9 (!) 7  Temp:  (!) 97 F (36.1 C)  SpO2: (!) 89% 92%    Physical Exam: Incisions:  Bandage is in tact and clean and dry   CBC    Component Value Date/Time   WBC 8.3 07/07/2017 0224   RBC 4.41 07/07/2017 0224   HGB 13.0 07/07/2017 0224   HCT 39.8 07/07/2017 0224   PLT 199 07/07/2017 0224   MCV 90.2 07/07/2017 0224   MCH 29.5 07/07/2017 0224   MCHC 32.7 07/07/2017 0224   RDW 13.7 07/07/2017 0224   LYMPHSABS 1.9 04/27/2014 1705   MONOABS 0.5 04/27/2014 1705   EOSABS 0.2 04/27/2014 1705   BASOSABS 0.0 04/27/2014 1705    BMET    Component Value Date/Time   NA 134 (L) 07/07/2017 0224   K 3.9 07/07/2017 0224   CL 100 (L) 07/07/2017 0224   CO2 22 07/07/2017 0224   GLUCOSE 124 (H) 07/07/2017 0224   BUN 16 07/07/2017 0224   CREATININE 1.69 (H) 07/07/2017 0224   CALCIUM 8.2 (L) 07/07/2017 0224   GFRNONAA 26 (L) 07/07/2017 0224   GFRAA 30 (L) 07/07/2017 0224    INR No results found for: INR   Intake/Output Summary (Last 24 hours) at 07/07/2017 [id number] Last data filed at 07/07/2017 0600 Gross per 24 hour  Intake 2600 ml  Output 50 ml  Net 2550 ml     Assessment/Plan:  84 y.o. female is s/p left below knee amputation  1 Day Post-Op  -pt sleeping-spoke with pt's family member.  She says she had a rough night with pain control.  Pt started on a PCA and works okay but when she goes to sleep, she doesn't press the button and the pain is worse when she wakes.  Consider keeping PCA today, but may benefit from changing to oral pain medications as the effects will last longer. -did not wake her as we will change her bandage tomorrow.  Her bandage is clean, dry and intact. -creatinine stable    Doreatha MassedSamantha Rhyne, PA-C [facility] [phone] 07/07/2017 7:28 AM   I have examined  the patient, reviewed and agree with above.  Patient sleeping and did not awaken.  This spoke with multiple family members present.  Had a very difficult night with pain management.  Sleeps and then when she awakens is in severe pain.  Has been better with PCA.  Explained to the family that she is having more than the typical amount of pain associated with below-knee amputation and that this should run its course with the majority being some better today and markedly better by tomorrow.  Will check her incision tomorrow as well.  Also explained to the family that there is a significant risk for hospital-based psychosis with her advanced age and narcotic requirement and prepare them for this possibility.  Gretta Beganodd Trentin Knappenberger, MD 07/07/2017 9:50 AM

## 2017-07-07 NOTE — NC FL2 (Signed)
[place] MEDICAID FL2 LEVEL OF CARE SCREENING TOOL     IDENTIFICATION  Patient Name: [name] Petersen Birthdate: [birth year] Sex: female Admission Date (Current Location): 07/06/2017  [facility] and [place] Number:  Producer, television/film/video and Address:  [facility], [address], [place], [place] [address]      Provider Number: [id number]  Attending Physician Name and Address:  Larina Earthly, MD  Relative Name and Phone Number:  Vivien Presto, daughter, [phone]    Current Level of Care: Hospital Recommended Level of Care: Skilled Nursing Facility Prior Approval Number:    Date Approved/Denied:   PASRR Number: [id number]  Discharge Plan: SNF    Current Diagnoses: Patient Active Problem List   Diagnosis Date Noted  . PAD (peripheral artery disease) (HCC) 07/06/2017  . Atherosclerosis of native arteries of the extremities with ulceration (HCC) 06/16/2017    Orientation RESPIRATION BLADDER Height & Weight     Self, Place  O2(nasal cannula 3L) Continent, External catheter Weight: 168 lb (76.2 kg) Height:     BEHAVIORAL SYMPTOMS/MOOD NEUROLOGICAL BOWEL NUTRITION STATUS      Continent Diet(please see DC summary)  AMBULATORY STATUS COMMUNICATION OF NEEDS Skin   Extensive Assist Verbally Surgical wounds(L BKA)                       Personal Care Assistance Level of Assistance  Bathing, Feeding, Dressing Bathing Assistance: Maximum assistance Feeding assistance: Limited assistance Dressing Assistance: Maximum assistance     Functional Limitations Info  Sight, Hearing, Speech Sight Info: Adequate Hearing Info: Adequate Speech Info: Adequate    SPECIAL CARE FACTORS FREQUENCY  PT (By licensed PT), OT (By licensed OT)     PT Frequency: 5x/week OT Frequency: 5x/week            Contractures Contractures Info: Not present    Additional Factors Info  Code Status, Allergies Code Status Info: Full Allergies Info: Scopace  (Scopolamine)           Current Medications (07/07/2017):  This is the current hospital active medication list Current Facility-Administered Medications  Medication Dose Route Frequency Provider Last Rate Last Dose  . 0.9 %  sodium chloride infusion   Intravenous Continuous Lars Mage, PA-C 125 mL/hr at 07/07/17 1200    . acetaminophen (TYLENOL) tablet 325-650 mg  325-650 mg Oral Q6H PRN Lars Mage, PA-C      . alum & mag hydroxide-simeth (MAALOX/MYLANTA) 200-200-20 MG/5ML suspension 15-30 mL  15-30 mL Oral Q2H PRN Clinton Gallant M, PA-C      . amLODipine (NORVASC) tablet 10 mg  10 mg Oral Daily Clinton Gallant M, PA-C   10 mg at 07/07/17 8469  . bisacodyl (DULCOLAX) EC tablet 5 mg  5 mg Oral Daily PRN Clinton Gallant M, PA-C      . clopidogrel (PLAVIX) tablet 75 mg  75 mg Oral Daily Clinton Gallant M, PA-C   75 mg at 07/07/17 6295  . diphenhydrAMINE (BENADRYL) injection 12.5 mg  12.5 mg Intravenous Q6H PRN Sherren Kerns, MD       Or  . diphenhydrAMINE (BENADRYL) 12.5 MG/5ML elixir 12.5 mg  12.5 mg Oral Q6H PRN Sherren Kerns, MD      . docusate sodium (COLACE) capsule 100 mg  100 mg Oral Daily Clinton Gallant M, PA-C   100 mg at 07/07/17 2841  . enoxaparin (LOVENOX) injection 30 mg  30 mg Subcutaneous Q24H Hammons, Gerhard Munch, [facility]  30 mg at 07/07/17 0904  . guaiFENesin-dextromethorphan (ROBITUSSIN DM) 100-10 MG/5ML syrup 15 mL  15 mL Oral Q4H PRN Clinton Gallantollins, Emma M, PA-C      . hydrALAZINE (APRESOLINE) injection 5 mg  5 mg Intravenous Q20 Min PRN Clinton Gallantollins, Emma M, PA-C      . hydrochlorothiazide (MICROZIDE) capsule 12.5 mg  12.5 mg Oral BID Larina EarthlyEarly, Todd F, MD   12.5 mg at 07/07/17 0909  . labetalol (NORMODYNE,TRANDATE) injection 10 mg  10 mg Intravenous Q10 min PRN Lars Mageollins, Emma M, PA-C   10 mg at 07/06/17 2135  . losartan (COZAAR) tablet 50 mg  50 mg Oral BID Larina EarthlyEarly, Todd F, MD   50 mg at 07/07/17 0903  . magnesium sulfate IVPB 2 g 50 mL  2 g Intravenous Daily PRN Clinton Gallantollins, Emma M, PA-C       . metoprolol tartrate (LOPRESSOR) injection 2-5 mg  2-5 mg Intravenous Q2H PRN Clinton Gallantollins, Emma M, PA-C      . morphine 2 MG/ML injection 0.5-1 mg  0.5-1 mg Intravenous Q1H PRN Clinton Gallantollins, Emma M, PA-C   1 mg at 07/07/17 1340  . morphine 2 mg/mL PCA injection   Intravenous Q4H Fields, Janetta Horaharles E, MD      . multivitamin (PROSIGHT) tablet 1 tablet  1 tablet Oral Daily Lars MageCollins, Emma M, PA-C   1 tablet at 07/07/17 [id number]  . naloxone [facility](NARCAN) injection 0.4 mg  0.4 mg Intravenous PRN Sherren KernsFields, Charles E, MD       And  . sodium chloride flush (NS) 0.9 % injection 9 mL  9 mL Intravenous PRN Sherren KernsFields, Charles E, MD      . ondansetron [facility](ZOFRAN) injection 4 mg  4 mg Intravenous Q6H PRN Sherren KernsFields, Charles E, MD      . oxyCODONE-acetaminophen (PERCOCET/ROXICET) 5-325 MG per tablet 1-2 tablet  1-2 tablet Oral Q4H PRN Lars Mageollins, Emma M, PA-C   2 tablet at 07/07/17 1252  . pantoprazole (PROTONIX) EC tablet 40 mg  40 mg Oral Daily Clinton GallantCollins, Emma M, PA-C   40 mg at 07/07/17 [id number]  . phenol (CHLORASEPTIC) mouth spray 1 spray  1 spray Mouth/Throat PRN Clinton Gallantollins, Emma M, PA-C      . potassium chloride SA (K-DUR,KLOR-CON) CR tablet 20-40 mEq  20-40 mEq Oral Daily PRN Clinton Gallantollins, Emma M, PA-C      . senna-docusate (Senokot-S) tablet 1 tablet  1 tablet Oral QHS PRN Clinton Gallantollins, Emma M, PA-C      . traMADol Janean Sark(ULTRAM) tablet 50-100 mg  50-100 mg Oral Q4H PRN Clinton Gallantollins, Emma M, PA-C   100 mg at 07/06/17 [id number]     Discharge Medications: Please see discharge summary for a list of discharge medications.  Relevant Imaging Results:  Relevant Lab Results:   Additional Information SSN: [id number]  Abigail ButtsSusan Mehtab Dolberry, LCSW

## 2017-07-07 NOTE — Plan of Care (Signed)
  Problem: Education: Goal: Knowledge of General Education information will improve Outcome: Progressing   Problem: Health Behavior/Discharge Planning: Goal: Ability to manage health-related needs will improve Outcome: Progressing   Problem: Clinical Measurements: Goal: Ability to maintain clinical measurements within normal limits will improve Outcome: Progressing Goal: Will remain free from infection Outcome: Progressing   

## 2017-07-07 NOTE — Care Management Note (Signed)
Case Management Note Donn PieriniKristi Gentle Hoge RN, BSN Unit 4E-Case Manager [phone]  Patient Details  Name: [name] Petersen MRN: [id number] Date of Birth: 04/06/1927  Subjective/Objective:    Pt admitted s/p BKA 07/06/17               Action/Plan: PTA pt lived at home, was using walker prior to BKA, has daughters that can assist at discharge- CIR has been consulted for possible admission, CSW will follow as needed for potential SNF as backup. CM will f/u with CIR once evaluated  and seen by admission coordinator.   Expected Discharge Date:                  Expected Discharge Plan:  IP Rehab Facility  In-House Referral:  Clinical Social Work  Discharge planning Services  CM Consult  Post Acute Care Choice:    Choice offered to:     DME Arranged:    DME Agency:     HH Arranged:    HH Agency:     Status of Service:  In process, will continue to follow  If discussed at Long Length of Stay Meetings, dates discussed:    Discharge Disposition:   Additional Comments:  Darrold SpanWebster, Ronnisha Felber Hall, RN 07/07/2017, 10:35 AM

## 2017-07-07 NOTE — Consult Note (Signed)
Physical Medicine and Rehabilitation Consult Reason for Consult: Decreased functional mobility Referring Physician: Dr. Arbie Cookey   HPI: [name] Petersen is a 84 y.o. right-handed female with history of CVA maintained on Plavix, hypertension, diabetes mellitus,CKD stage III.  Per chart review patient lives alone.  She was independent up until January and then began using a walker.  One level home.  She does not drive.  She has 3 daughters who can assist as needed.  Presented 07/06/2017 with critical limb ischemia left lower extremity with unreconstructable distal tibial disease.  No change with conservative care.  Underwent left BKA 07/06/2017 per Dr. Arbie Cookey.  Hospital course pain management.  Monitoring of creatinine 1.69 from baseline 1.30-1.59.  Subcutaneous Lovenox for DVT prophylaxis.  Physical and occupational therapy evaluations pending.  MD has requested physical medicine rehab consult.   Review of Systems  Constitutional: Negative for chills and fever.  HENT: Negative for hearing loss.   Eyes: Negative for blurred vision and double vision.  Respiratory: Negative for cough and shortness of breath.   Cardiovascular: Positive for leg swelling. Negative for chest pain and palpitations.  Gastrointestinal: Positive for constipation. Negative for nausea.  Genitourinary: Negative for flank pain and hematuria.  Musculoskeletal: Positive for joint pain and myalgias.  Skin: Negative for rash.  Neurological: Negative for seizures.  All other systems reviewed and are negative.  Past Medical History:  Diagnosis Date  . Arthritis    hands  . Complication of anesthesia    lost eyesight for a few days after surgery in the 1950's  . Constipation   . Diabetes mellitus without complication (HCC)    no medications   . Hypertension   . Macular degeneration   . Pneumonia   . Stroke [facility])    some trouble swallowing   Past Surgical History:  Procedure Laterality Date  . ABDOMINAL AORTOGRAM  W/LOWER EXTREMITY N/A 06/16/2017   Procedure: ABDOMINAL AORTOGRAM W/LOWER EXTREMITY;  Surgeon: Fransisco Hertz, MD;  Location: [facility] INVASIVE CV LAB;  Service: Cardiovascular;  Laterality: N/A;  lt extermity  . ABDOMINAL HYSTERECTOMY    . CATARACT EXTRACTION     Family History  Adopted: Yes   Social History:  reports that she has never smoked. She has never used smokeless tobacco. She reports that she does not drink alcohol or use drugs. Allergies:  Allergies  Allergen Reactions  . Scopace [Scopolamine] Other (See Comments)    Hallucinations   Medications Prior to Admission  Medication Sig Dispense Refill  . acetaminophen (TYLENOL) 325 MG tablet Take 325-650 mg by mouth every 6 (six) hours as needed for moderate pain or headache.    Marland Kitchen amLODipine (NORVASC) 10 MG tablet Take 10 mg by mouth daily.    . beta carotene w/minerals (OCUVITE) tablet Take 1 tablet by mouth daily.    Marland Kitchen losartan-hydrochlorothiazide (HYZAAR) 50-12.5 MG per tablet Take 1 tablet by mouth 2 (two) times daily.     . traMADol (ULTRAM) 50 MG tablet Take 50-100 mg by mouth every 4 (four) hours as needed for moderate pain.     Marland Kitchen clopidogrel (PLAVIX) 75 MG tablet Take 75 mg by mouth daily.      Home:    Functional History:   Functional Status:  Mobility:          ADL:    Cognition: Cognition Orientation Level: Oriented X4    Blood pressure (!) 121/55, pulse 80, temperature (!) 97 F (36.1 C), temperature source Axillary, resp. rate Marland Kitchen)  7, weight 76.2 kg (168 lb), SpO2 92 %. Physical Exam  Vitals reviewed. HENT:  Head: Normocephalic.  Eyes: EOM are normal.  Neck: Normal range of motion. Neck supple. No thyromegaly present.  Cardiovascular: Normal rate, regular rhythm and normal heart sounds.  Respiratory: Effort normal and breath sounds normal. No respiratory distress.  GI: Soft. Bowel sounds are normal. She exhibits no distension.  Neurological:  Patient is a bit lethargic but arousable.  She would fall  back asleep during the exam but was able to provide her name and follow simple commands.  Skin:  Left  BKA site is dressed and appropriately tender.    Results for orders placed or performed during the hospital encounter of 07/06/17 (from the past 24 hour(s))  Glucose, capillary     Status: None   Collection Time: 07/06/17  1:35 PM  Result Value Ref Range   Glucose-Capillary 80 65 - 99 mg/dL   Comment 1 Notify RN    Comment 2 Document in Chart   CBC     Status: None   Collection Time: 07/06/17  1:54 PM  Result Value Ref Range   WBC 7.7 4.0 - 10.5 K/uL   RBC 4.87 3.87 - 5.11 MIL/uL   Hemoglobin 14.5 12.0 - 15.0 g/dL   HCT 16.1 09.6 - 04.5 %   MCV 88.1 78.0 - 100.0 fL   MCH 29.8 26.0 - 34.0 pg   MCHC 33.8 30.0 - 36.0 g/dL   RDW 40.9 81.1 - 91.4 %   Platelets 224 150 - 400 K/uL  Comprehensive metabolic panel     Status: Abnormal   Collection Time: 07/06/17  1:54 PM  Result Value Ref Range   Sodium 136 135 - 145 mmol/L   Potassium 3.5 3.5 - 5.1 mmol/L   Chloride 98 (L) 101 - 111 mmol/L   CO2 22 22 - 32 mmol/L   Glucose, Bld 86 65 - 99 mg/dL   BUN 17 6 - 20 mg/dL   Creatinine, Ser 7.82 (H) 0.44 - 1.00 mg/dL   Calcium 9.3 8.9 - 95.6 mg/dL   Total Protein 6.9 6.5 - 8.1 g/dL   Albumin 4.1 3.5 - 5.0 g/dL   AST 28 15 - 41 U/L   ALT 22 14 - 54 U/L   Alkaline Phosphatase 65 38 - 126 U/L   Total Bilirubin 0.9 0.3 - 1.2 mg/dL   GFR calc non Af Amer 25 (L) >60 mL/min   GFR calc Af Amer 29 (L) >60 mL/min   Anion gap 16 (H) 5 - 15  Hemoglobin A1c     Status: Abnormal   Collection Time: 07/06/17  1:55 PM  Result Value Ref Range   Hgb A1c MFr Bld 6.0 (H) 4.8 - 5.6 %   Mean Plasma Glucose 125.5 mg/dL  Glucose, capillary     Status: Abnormal   Collection Time: 07/06/17  5:15 PM  Result Value Ref Range   Glucose-Capillary 124 (H) 65 - 99 mg/dL  Basic metabolic panel     Status: Abnormal   Collection Time: 07/07/17  2:24 AM  Result Value Ref Range   Sodium 134 (L) 135 - 145 mmol/L    Potassium 3.9 3.5 - 5.1 mmol/L   Chloride 100 (L) 101 - 111 mmol/L   CO2 22 22 - 32 mmol/L   Glucose, Bld 124 (H) 65 - 99 mg/dL   BUN 16 6 - 20 mg/dL   Creatinine, Ser 2.13 (H) 0.44 - 1.00 mg/dL   Calcium  8.2 (L) 8.9 - 10.3 mg/dL   GFR calc non Af Amer 26 (L) >60 mL/min   GFR calc Af Amer 30 (L) >60 mL/min   Anion gap 12 5 - 15  CBC     Status: None   Collection Time: 07/07/17  2:24 AM  Result Value Ref Range   WBC 8.3 4.0 - 10.5 K/uL   RBC 4.41 3.87 - 5.11 MIL/uL   Hemoglobin 13.0 12.0 - 15.0 g/dL   HCT 16.139.8 09.636.0 - 04.546.0 %   MCV 90.2 78.0 - 100.0 fL   MCH 29.5 26.0 - 34.0 pg   MCHC 32.7 30.0 - 36.0 g/dL   RDW 40.913.7 81.111.5 - 91.415.5 %   Platelets 199 150 - 400 K/uL   No results found.  Assessment/Plan: Diagnosis: PAD s/p left BKA, POD #1 1. Does the need for close, 24 hr/day medical supervision in concert with the patient's rehab needs make it unreasonable for this patient to be served in a less intensive setting? Yes 2. Co-Morbidities requiring supervision/potential complications: hx of CVA, macular degeneration, DM, HTN 3. Due to bladder management, bowel management, safety, skin/wound care, disease management, medication administration, pain management and patient education, does the patient require 24 hr/day rehab nursing? Yes 4. Does the patient require coordinated care of a physician, rehab nurse, PT (1-2 hrs/day, 5 days/week) and OT (1-2 hrs/day, 5 days/week) to address physical and functional deficits in the context of the above medical diagnosis(es)? Yes and Potentially Addressing deficits in the following areas: balance, endurance, locomotion, strength, transferring, bowel/bladder control, bathing, dressing, feeding, grooming, toileting and psychosocial support 5. Can the patient actively participate in an intensive therapy program of at least 3 hrs of therapy per day at least 5 days per week? Yes 6. The potential for patient to make measurable gains while on inpatient rehab is  excellent 7. Anticipated functional outcomes upon discharge from inpatient rehab are modified independent and supervision  with PT, modified independent, supervision and min assist with OT, n/a with SLP. W/C level goals likely 8. Estimated rehab length of stay to reach the above functional goals is: potentially 10-18 days 9. Anticipated D/C setting: Home 10. Anticipated post D/C treatments: HH therapy and Outpatient therapy 11. Overall Rehab/Functional Prognosis: good  RECOMMENDATIONS: This patient's condition is appropriate for continued rehabilitative care in the following setting: CIR Patient has agreed to participate in recommended program. N/A Note that insurance prior authorization may be required for reimbursement for recommended care.  Comment: Spoke with daughter. Pt was ambulatory with walker prior to admit. Has one story house. Family working on ramp to enter. Daughters can help after discharge. Rehab Admissions Coordinator to follow up.  Thanks,  Ranelle OysterZachary T. Romilda Proby, MD, [place] DomFAAPMR     Mcarthur Rossettianiel J Angiulli, PA-C 07/07/2017

## 2017-07-07 NOTE — Clinical Social Work Note (Signed)
Clinical Social Work Assessment  Patient Details  Name: [name] Petersen MRN: [id number] Date of Birth: 20-Feb-1927  Date of referral:  07/07/17               Reason for consult:  Facility Placement(SNF backup to CIR)                Permission sought to share information with:  Facility Sport and exercise psychologist, Family Supports Permission granted to share information::  No(patient asleep, daughter at bedside)  Name::     [name] Petersen Born  Agency::  SNFs  Relationship::  daughter  Contact Information:  [phone]  Housing/Transportation Living arrangements for the past 2 months:  Single Family Home Source of Information:  Adult Children Patient Interpreter Needed:  None Criminal Activity/Legal Involvement Pertinent to Current Situation/Hospitalization:  No - Comment as needed Significant Relationships:  Adult Children Lives with:  Self Do you feel safe going back to the place where you live?  Yes Need for family participation in patient care:  Yes (Comment)  Care giving concerns: Patient from home independently. PT recommending CIR. CIR following. CSW completing assessment for SNF backup to CIR.   Social Worker assessment / plan: CSW met with patient's daughter, [name] Petersen, at bedside. Patient was asleep. CSW discussed PT recommendations for CIR. Daughter was reviewing materials given by [facility] admissions coordinator. Daughter very hopeful for admit to CIR. CSW discussed SNF backup; daughter agreeable to SNF backup, stating "we know she'll need rehab." Daughter and her sisters can provide support for patient at home once rehab completed. CSW to send out initial SNF referrals and follow for disposition planning.  Employment status:  Retired Forensic scientist:  Medicare PT Recommendations:  Inpatient [place] / Referral to community resources:  [place]  Patient/Family's Response to care: Daughter appreciative of care.  Patient/Family's Understanding of and  Emotional Response to Diagnosis, Current Treatment, and Prognosis: Daughter with good understanding of patient's condition and hopeful for CIR.  Emotional Assessment Appearance:  Appears stated age Attitude/Demeanor/Rapport:  Unable to Assess Affect (typically observed):  Unable to Assess Orientation:  Oriented to Self, Oriented to Place Alcohol / Substance use:  Not Applicable Psych involvement (Current and /or in the community):  No (Comment)  Discharge Needs  Concerns to be addressed:  Discharge Planning Concerns, Care Coordination Readmission within the last 30 days:  No Current discharge risk:  Physical Impairment, Dependent with Mobility Barriers to Discharge:  Continued Medical Work up   [name] Emms, LCSW 07/07/2017, 3:28 PM

## 2017-07-07 NOTE — Progress Notes (Signed)
Rehab admissions - I met with patient and her daughter at the bedside.  Patient was working with therapy when I rounded.  I gave daughter rehab booklets.  Daughter would like rehab here at [facility].  I will follow up again tomorrow.  Call me for questions.  #[phone]

## 2017-07-08 ENCOUNTER — Telehealth: Payer: Self-pay | Admitting: Vascular Surgery

## 2017-07-08 LAB — BASIC METABOLIC PANEL
Anion gap: 13 (ref 5–15)
BUN: 24 mg/dL — ABNORMAL HIGH (ref 6–20)
CO2: 19 mmol/L — ABNORMAL LOW (ref 22–32)
Calcium: 7.9 mg/dL — ABNORMAL LOW (ref 8.9–10.3)
Chloride: 102 mmol/L (ref 101–111)
Creatinine, Ser: 1.97 mg/dL — ABNORMAL HIGH (ref 0.44–1.00)
GFR calc Af Amer: 25 mL/min — ABNORMAL LOW (ref >60)
GFR calc non Af Amer: 22 mL/min — ABNORMAL LOW (ref >60)
Glucose, Bld: 69 mg/dL (ref 65–99)
Potassium: 4.1 mmol/L (ref 3.5–5.1)
Sodium: 134 mmol/L — ABNORMAL LOW (ref 135–145)

## 2017-07-08 LAB — CBC
HCT: 38 % (ref 36.0–46.0)
Hemoglobin: 12.2 g/dL (ref 12.0–15.0)
MCH: 29.5 pg (ref 26.0–34.0)
MCHC: 32.1 g/dL (ref 30.0–36.0)
MCV: 92 fL (ref 78.0–100.0)
Platelets: 184 10*3/uL (ref 150–400)
RBC: 4.13 MIL/uL (ref 3.87–5.11)
RDW: 14.5 % (ref 11.5–15.5)
WBC: 8.5 10*3/uL (ref 4.0–10.5)

## 2017-07-08 MED ORDER — HEPARIN SODIUM (PORCINE) 5000 UNIT/ML IJ SOLN
5000.0000 [IU] | Freq: Three times a day (TID) | INTRAMUSCULAR | Status: DC
  Administered 2017-07-08 – 2017-07-10 (×6): 5000 [IU] via SUBCUTANEOUS
  Filled 2017-07-08 (×6): qty 1

## 2017-07-08 MED ORDER — OXYCODONE-ACETAMINOPHEN 5-325 MG PO TABS: 1.0000 | ORAL_TABLET | Freq: Four times a day (QID) | ORAL | Status: DC | PRN

## 2017-07-08 NOTE — Telephone Encounter (Signed)
-----   Message from Sharee PimpleMarilyn K McChesney, RN sent at 07/08/2017 10:37 AM EDT ----- Regarding: 4 weeks post op BKA   ----- Message ----- From: Dara Lordshyne, Samantha J, PA-C Sent: 07/08/2017  10:28 AM To: Vvs Charge Pool  S/p left BKA.  F/u with Dr. Arbie CookeyEarly in 4 weeks.  Thanks

## 2017-07-08 NOTE — Discharge Summary (Signed)
Discharge Summary    [name] Petersen [birth year] 84 y.o. female  [id number]  Admission Date: 07/06/2017  Discharge Date: 07/08/17  Physician: Larina Earthly, MD  Admission Diagnosis: NON-VIABLE TISSUE LEFT LOWER EXTREMITY   HPI:   This is a 84 y.o. female here today for follow-up.  She is here with her 2 daughters and granddaughter.  She continues to have severe arterial rest pain in her left foot.  She reports that she has 2 hold her leg dependently over the bed at night to allow any rest.  She has had no fevers or progression of her tissue loss with blistering on the tip of her left great toe.   Hospital Course:  The patient was admitted to the hospital and taken to the operating room on 07/06/2017 and underwent: Left below knee amputation    The pt tolerated the procedure well and was transported to the PACU in good condition.   On POD 1, she had a rough night with pain.  A PCA was started and able to be weaned and started on po pain medication.   During her hospitalization, she had an increase in her creatinine to 1.97, which returned to 1.37.  CKD 3 as documented.    Pt's pain level began to slowly improve throughout her hospitalization.  She did have some nausea.  She was transferred to CIR on 07/10/17 for rehab.   The remainder of the hospital course consisted of increasing mobilization and increasing intake of solids without difficulty.  CBC    Component Value Date/Time   WBC 8.5 07/08/2017 0332   RBC 4.13 07/08/2017 0332   HGB 12.2 07/08/2017 0332   HCT 38.0 07/08/2017 0332   PLT 184 07/08/2017 0332   MCV 92.0 07/08/2017 0332   MCH 29.5 07/08/2017 0332   MCHC 32.1 07/08/2017 0332   RDW 14.5 07/08/2017 0332   LYMPHSABS 1.9 04/27/2014 1705   MONOABS 0.5 04/27/2014 1705   EOSABS 0.2 04/27/2014 1705   BASOSABS 0.0 04/27/2014 1705    BMET    Component Value Date/Time   NA 134 (L) 07/08/2017 0332   K 4.1 07/08/2017 0332   CL 102 07/08/2017 0332   CO2 19 (L)  07/08/2017 0332   GLUCOSE 69 07/08/2017 0332   BUN 24 (H) 07/08/2017 0332   CREATININE 1.97 (H) 07/08/2017 0332   CALCIUM 7.9 (L) 07/08/2017 0332   GFRNONAA 22 (L) 07/08/2017 0332   GFRAA 25 (L) 07/08/2017 0332        Discharge Diagnosis:  NON-VIABLE TISSUE LEFT LOWER EXTREMITY  Secondary Diagnosis: Patient Active Problem List   Diagnosis Date Noted  . PAD (peripheral artery disease) (HCC) 07/06/2017  . Atherosclerosis of native arteries of the extremities with ulceration (HCC) 06/16/2017   Past Medical History:  Diagnosis Date  . Arthritis    hands  . Complication of anesthesia    lost eyesight for a few days after surgery in the 1950's  . Constipation   . Diabetes mellitus without complication (HCC)    no medications   . Hypertension   . Macular degeneration, bilateral    "one eye wet; one eye dry; she gets shots" (07/07/2017)  . Pneumonia   . Stroke [facility])    some trouble swallowing     Allergies as of 07/08/2017      Reactions   Scopace [scopolamine] Other (See Comments)   Hallucinations      Medication List    STOP taking these medications   losartan-hydrochlorothiazide 50-12.5  MG tablet Commonly known as:  HYZAAR     TAKE these medications   acetaminophen 325 MG tablet Commonly known as:  TYLENOL Take 325-650 mg by mouth every 6 (six) hours as needed for moderate pain or headache.   amLODipine 10 MG tablet Commonly known as:  NORVASC Take 10 mg by mouth daily.   beta carotene w/minerals tablet Take 1 tablet by mouth daily.   clopidogrel 75 MG tablet Commonly known as:  PLAVIX Take 75 mg by mouth daily.   oxyCODONE-acetaminophen 5-325 MG tablet Commonly known as:  PERCOCET/ROXICET Take 1 tablet by mouth every 6 (six) hours as needed for moderate pain.   traMADol 50 MG tablet Commonly known as:  ULTRAM Take 50-100 mg by mouth every 4 (four) hours as needed for moderate pain.       Prescriptions given: none  Instructions: 1.  CBC/BMP  on 07/09/17  Disposition: CIR  Patient's condition: is Good  Follow up: 1. Dr. Arbie CookeyEarly in 4 weeks   Doreatha MassedSamantha Orine Goga, PA-C [facility] [phone] 07/08/2017  10:29 AM

## 2017-07-08 NOTE — Progress Notes (Signed)
MD notified about pt retaining urine and inability to void. Bladder scan revealed greater than 355 in bladder. MD gave verbal order to in and out cath for a volume greater than 200. 650ml emptied from bladder via in and out. Pt tolerated activity well and is resting in bed.   [name] Petersen

## 2017-07-08 NOTE — Progress Notes (Addendum)
  Progress Note    07/08/2017 8:10 AM 2 Days Post-Op  Subjective:  Resting in bed; still using PCA  Tm 99.9 130's-150's systolic HR 70's-90's 95% [id number]  Vitals:   07/08/17 0400 07/08/17 0753  BP: 140/65 134/69  Pulse:    Resp: (!) 9 14  Temp:    SpO2: 97% 96%    Physical Exam: Incisions:  Dressing removed and wound is clean and dry with staples in tact.    CBC    Component Value Date/Time   WBC 8.5 07/08/2017 0332   RBC 4.13 07/08/2017 0332   HGB 12.2 07/08/2017 0332   HCT 38.0 07/08/2017 0332   PLT 184 07/08/2017 0332   MCV 92.0 07/08/2017 0332   MCH 29.5 07/08/2017 0332   MCHC 32.1 07/08/2017 0332   RDW 14.5 07/08/2017 0332   LYMPHSABS 1.9 04/27/2014 1705   MONOABS 0.5 04/27/2014 1705   EOSABS 0.2 04/27/2014 1705   BASOSABS 0.0 04/27/2014 1705    BMET    Component Value Date/Time   NA 134 (L) 07/08/2017 0332   K 4.1 07/08/2017 0332   CL 102 07/08/2017 0332   CO2 19 (L) 07/08/2017 0332   GLUCOSE 69 07/08/2017 0332   BUN 24 (H) 07/08/2017 0332   CREATININE 1.97 (H) 07/08/2017 0332   CALCIUM 7.9 (L) 07/08/2017 0332   GFRNONAA 22 (L) 07/08/2017 0332   GFRAA 25 (L) 07/08/2017 0332    INR No results found for: INR   Intake/Output Summary (Last 24 hours) at 07/08/2017 0810 Last data filed at 07/08/2017 0600 Gross per 24 hour  Intake 1615 ml  Output 100 ml  Net 1515 ml     Assessment/Plan:  84 y.o. female is s/p left below knee amputation  2 Days Post-Op  -pt dressing removed and replaced.  Wound looks good-clean and dry with staples in tact. -encouraged pt to work on straightening her knee Hopefully to CIR -may benefit to change to po pain medication. -creatinine rising and is 1.97 today-will dc Losartan and will change Lovenox to SQ heparin.  Potassium ok at 4.1 -check labs in am -ok to discharge to CIR when pt is off PCA and medically ready   Doreatha MassedSamantha Rhyne, PA-C [facility] [phone] 07/08/2017 8:10 AM  I have  examined the patient, reviewed and agree with above.  More comfortable than yesterday.  Having some urinary retention and may require in and out cath.  Creatinine up slightly.  Not ready for CIR quite yet at 48 hours.  Continue physical therapy and pain management.  Hopefully rehab on Monday.  Discussed with daughters present.  Gretta Beganodd Johnel Yielding, MD 07/08/2017 1:09 PM

## 2017-07-08 NOTE — Care Management Note (Signed)
Case Management Note Donn PieriniKristi Rafeal Skibicki RN, BSN Unit 4E-Case Manager [phone]  Patient Details  Name: [name] Petersen MRN: [id number] Date of Birth: 09/14/1926  Subjective/Objective:    Pt admitted s/p BKA 07/06/17               Action/Plan: PTA pt lived at home, was using walker prior to BKA, has daughters that can assist at discharge- CIR has been consulted for possible admission, CSW will follow as needed for potential SNF as backup. CM will f/u with CIR once evaluated  and seen by admission coordinator.   Expected Discharge Date:                  Expected Discharge Plan:  IP Rehab Facility  In-House Referral:  Clinical Social Work  Discharge planning Services  CM Consult  Post Acute Care Choice:    Choice offered to:     DME Arranged:    DME Agency:     HH Arranged:    HH Agency:     Status of Service:  In process, will continue to follow  If discussed at Long Length of Stay Meetings, dates discussed:    Discharge Disposition: IP rehab ([facility])   Additional Comments:  07/08/17- 1600- Donn PieriniKristi Maneh Sieben RN, CM- pt off PCA, spoke with Genie from [facility] CIR- pt not ready medically for IP rehab today- CIR will plan to admit pt over the weekend- on Sunday.   Darrold SpanWebster, Habiba Treloar Hall, RN 07/08/2017, 3:58 PM

## 2017-07-08 NOTE — Progress Notes (Signed)
Occupational Therapy Treatment Patient Details Name: [name] Petersen MRN: [id number] DOB: 1927/02/21 Today's Date: 07/08/2017    History of present illness Pt is an 84 y.o. 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 PMH includes DM, HTN, PVD, PNA, arthritis, CVA, macular degeneration.   OT comments  Pt with minimal ability to participate in activity today due to lethargy, likely due to pain medication. Max assist to sit at EOB and 2 person assist for lateral transfer to drop arm chair. Pt with increased pain and guarding throughout session. Daughters are hopeful for CIR admission.   Follow Up Recommendations  CIR;Supervision/Assistance - 24 hour    Equipment Recommendations  Other (comment)(defer to next venue)    Recommendations for Other Services      Precautions / Restrictions Precautions Precautions: Fall Restrictions Weight Bearing Restrictions: No       Mobility Bed Mobility Overal bed mobility: Needs Assistance Bed Mobility: Rolling;Sidelying to Sit Rolling: Max assist Sidelying to sit: Max assist;+2 for physical assistance;HOB elevated       General bed mobility comments: MaxA+2 for bed mobility to sit EOB; pt very lethargic this session and not providing as much assist. Use of bed pad to help reposition hips at EOB  Transfers Overall transfer level: Needs assistance Equipment used: 2 person hand held assist Transfers: Lateral/Scoot Transfers         Lateral/Scoot Transfers: +2 safety/equipment;Total assist General transfer comment: no attempt to stand, did not participate in transfer to chair    Balance Overall balance assessment: Needs assistance Sitting-balance support: Bilateral upper extremity supported Sitting balance-Leahy Scale: Poor Sitting balance - Comments: Reliant on BUEs to maintain seated balance and min-maxA (suspect due to lethargy) Postural control: Posterior lean                                  ADL either performed or assessed with clinical judgement   ADL Overall ADL's : Needs assistance/impaired                                       General ADL Comments: Requiring total assist, unable to participate due to lethargy.     Vision       Perception     Praxis      Cognition Arousal/Alertness: Lethargic;Suspect due to medications Behavior During Therapy: Anxious Overall Cognitive Status: Impaired/Different from baseline Area of Impairment: Following commands;Problem solving;Memory;Attention                   Current Attention Level: Focused Memory: Decreased short-term memory Following Commands: Follows one step commands with increased time     Problem Solving: Slow processing;Decreased initiation;Difficulty sequencing;Requires verbal cues;Requires tactile cues General Comments: Pt very lethargic with eyes closed majority of session, requiring max multimodal cues to participate, suspect due to pain medications (RN aware)        Exercises Other Exercises Other Exercises: L residual limb knee extension stretching x3 and repositioning to encourage extension (to pt's tolerance with pain)   Shoulder Instructions       General Comments      Pertinent Vitals/ Pain       Pain Assessment: Faces Faces Pain Scale: Hurts whole lot Pain Location: L residual limb with mobility/ROM Pain Descriptors / Indicators: Grimacing;Guarding;Moaning Pain Intervention(s): Monitored during session;Premedicated before  session;Repositioned  Home Living                                          Prior Functioning/Environment              Frequency  Min 2X/week        Progress Toward Goals  OT Goals(current goals can now be found in the care plan section)  Progress towards OT goals: Not progressing toward goals - comment(lethargic)  Acute Rehab OT Goals Patient Stated Goal: to return to independence and dance OT Goal  Formulation: With patient Time For Goal Achievement: 07/21/17 Potential to Achieve Goals: Good  Plan Discharge plan remains appropriate    Co-evaluation    PT/OT/SLP Co-Evaluation/Treatment: Yes Reason for Co-Treatment: For patient/therapist safety PT goals addressed during session: Mobility/safety with mobility;Balance OT goals addressed during session: Strengthening/ROM      AM-PAC PT "6 Clicks" Daily Activity     Outcome Measure   Help from another person eating meals?: Total Help from another person taking care of personal grooming?: Total Help from another person toileting, which includes using toliet, bedpan, or urinal?: Total Help from another person bathing (including washing, rinsing, drying)?: Total Help from another person to put on and taking off regular upper body clothing?: Total Help from another person to put on and taking off regular lower body clothing?: Total 6 Click Score: 6    End of Session Equipment Utilized During Treatment: Gait belt;Oxygen(3L)  OT Visit Diagnosis: Pain;Muscle weakness (generalized) (M62.81);Other symptoms and signs involving cognitive function Pain - Right/Left: Left Pain - part of body: Leg   Activity Tolerance Patient limited by lethargy   Patient Left in chair;with call bell/phone within reach;with chair alarm set;with family/visitor present;with nursing/sitter in room   Nurse Communication Mobility status        Time: 1029-1101 OT Time Calculation (min): 32 min  Charges: OT General Charges $OT Visit: 1 Visit OT Treatments $Therapeutic Activity: 8-22 mins  07/08/2017 [name] Petersen, OTR/L Pager: [phone]   [name] Petersen, [name] Petersen 07/08/2017, 11:55 AM

## 2017-07-08 NOTE — Progress Notes (Signed)
Physical Therapy Treatment Patient Details Name: [name] Petersen MRN: [id number] DOB: [birth year] Today's Date: 07/08/2017    History of Present Illness Pt is an 84 y.o. 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 PMH includes DM, HTN, PVD, PNA, arthritis, CVA, macular degeneration.   PT Comments    Pt remains limited by lethargy, suspect due to pain medications. Able to perform lateral scoot to drop arm chair with maxA+2. Performed L residual limb extension stretching and repositioning to encourage knee extension. Pt very guarded throughout session with significant LLE pain during mobility. Suspect much improved participation and motivation once she is not so lethargic. Daughters present throughout and very supportive. Continue to recommend intensive CIR-level therapies. Will follow acutely.   Follow Up Recommendations  CIR;Supervision/Assistance - 24 hour     Equipment Recommendations  (TBD next venue)    Recommendations for Other Services       Precautions / Restrictions Precautions Precautions: Fall Restrictions Weight Bearing Restrictions: No    Mobility  Bed Mobility Overal bed mobility: Needs Assistance Bed Mobility: Rolling;Sidelying to Sit Rolling: Max assist Sidelying to sit: Max assist;+2 for physical assistance;HOB elevated       General bed mobility comments: MaxA+2 for bed mobility to sit EOB; pt very lethargic this session and not providing as much assist. Use of bed pad to help reposition hips at EOB  Transfers Overall transfer level: Needs assistance Equipment used: 2 person hand held assist Transfers: Sit to/from Stand;Lateral/Scoot Transfers Sit to Stand: Max assist;+2 physical assistance        Lateral/Scoot Transfers: Max assist;+2 safety/equipment General transfer comment: Unable to stand despite maxA+2. Performed lateral scoot transfer from bed to drop arm chair with maxA+2; pt lethargy limiting participation. Very guarded  throughout transfer secondary to pain  Ambulation/Gait                 Stairs            Wheelchair Mobility    Modified Rankin (Stroke Patients Only)       Balance Overall balance assessment: Needs assistance Sitting-balance support: Bilateral upper extremity supported Sitting balance-Leahy Scale: Poor Sitting balance - Comments: Reliant on BUEs to maintain seated balance and min-maxA (suspect due to lethargy) Postural control: Posterior lean                                  Cognition Arousal/Alertness: Lethargic;Suspect due to medications Behavior During Therapy: Anxious Overall Cognitive Status: Impaired/Different from baseline Area of Impairment: Following commands;Problem solving;Memory;Attention                   Current Attention Level: Focused Memory: Decreased short-term memory Following Commands: Follows one step commands with increased time     Problem Solving: Slow processing;Decreased initiation;Difficulty sequencing;Requires verbal cues;Requires tactile cues General Comments: Pt very lethargic with eyes closed majority of session, requiring max multimodal cues to participate, suspect due to pain medications (RN aware)      Exercises Other Exercises Other Exercises: L residual limb knee extension stretching x3 and repositioning to encourage extension (to pt's tolerance with pain)    General Comments        Pertinent Vitals/Pain Pain Assessment: Faces Faces Pain Scale: Hurts whole lot Pain Location: L residual limb with mobility/ROM Pain Descriptors / Indicators: Grimacing;Guarding;Moaning Pain Intervention(s): Monitored during session;Repositioned    Home Living  Prior Function            PT Goals (current goals can now be found in the care plan section) Acute Rehab PT Goals Patient Stated Goal: to return to independence and dance PT Goal Formulation: With patient Time For Goal  Achievement: 07/21/17 Potential to Achieve Goals: Fair Progress towards PT goals: Progressing toward goals    Frequency    Min 3X/week      PT Plan Current plan remains appropriate    Co-evaluation PT/OT/SLP Co-Evaluation/Treatment: Yes Reason for Co-Treatment: For patient/therapist safety;To address functional/ADL transfers PT goals addressed during session: Mobility/safety with mobility;Balance        AM-PAC PT "6 Clicks" Daily Activity  Outcome Measure  Difficulty turning over in bed (including adjusting bedclothes, sheets and blankets)?: Unable Difficulty moving from lying on back to sitting on the side of the bed? : Unable Difficulty sitting down on and standing up from a chair with arms (e.g., wheelchair, bedside commode, etc,.)?: Unable Help needed moving to and from a bed to chair (including a wheelchair)?: A Lot Help needed walking in hospital room?: Total Help needed climbing 3-5 steps with a railing? : Total 6 Click Score: 7    End of Session Equipment Utilized During Treatment: Gait belt Activity Tolerance: Patient limited by lethargy Patient left: in chair;with call bell/phone within reach;with chair alarm set;with family/visitor present Nurse Communication: Mobility status PT Visit Diagnosis: Other abnormalities of gait and mobility (R26.89);Muscle weakness (generalized) (M62.81);Difficulty in walking, not elsewhere classified (R26.2)     Time: 1026-1100 PT Time Calculation (min) (ACUTE ONLY): 34 min  Charges:  $Therapeutic Activity: 8-22 mins                    G Codes:      Ina HomesJaclyn Fahim Kats, PT, DPT Acute Rehab Services  Pager: [phone]  Malachy ChamberJaclyn L Mersedes Alber 07/08/2017, 11:12 AM

## 2017-07-08 NOTE — PMR Pre-admission (Signed)
PMR Admission Coordinator Pre-Admission Assessment  Patient: [name] Petersen is an 84 y.o., female MRN: [id number] DOB: 1926-09-16 Height: 5'4" Weight: 76.2 kg (168 lb)             Insurance Information HMO: NOo   PPO:       PCP:       IPA:       80/20:       OTHER:   PRIMARY:  Medicare A and B      Policy#: [id number]      Subscriber: patient CM Name:        Phone#:       Fax#:   Pre-Cert#:        Employer: Retired Benefits:  Phone #:       Name: Checked in Passport One source Eff. Date: 01/17/94     Deduct:  $1364      Out of Pocket Max: none      Life Max: N/A CIR: 100%      SNF: 100 days Outpatient: 80%     Co-Pay: 20% Home Health: 100%      Co-Pay: none DME: 80%     Co-Pay: 20% Providers: patient's choice  SECONDARY:  [employer]      Policy#: [id number]      Subscriber:  patient CM Name:        Phone#:       Fax#:   Pre-Cert#:        Employer: Retired Benefits:  Phone #: [phone]     Name:   Eff. Date:       Deduct:        Out of Pocket Max:        Life Max:   CIR:        SNF:   Outpatient:       Co-Pay:   Home Health:        Co-Pay:   DME:       Co-Pay:    Emergency Contact Information Contact Information    Name Relation Home Work [name] Petersen Daughter [phone]  [phone]   [facility] Daughter [phone]  [phone]   [name] Petersen   [phone]     Current Medical History  Patient Admitting Diagnosis:  L BKA  History of Present Illness: An 84 y.o.right-handed femalewith history of CVA maintained on Plavix, hypertension, diet-controlled diabetes mellitus,CKDstage III.Per chart review patient lives alone. She was independent up until January and then began using a walker. One level home. She does not drive. She has 3 daughters who can assist as needed.Presented 07/06/2017 with critical limb ischemia left lower extremity with unreconstructable distal tibial disease. No change with conservative care. Underwent left  BKA 07/06/2017 per Dr. Arbie Cookey. Hospital course pain management. Monitoring of creatinine 1.97 from baseline 1.30-1.59. Subcutaneous Lovenox for DVT prophylaxis. Physical and occupational therapy evaluations completed with recommendations of physical medicine rehab consult.  Patient to be admitted for a comprehensive inpatient rehab program.  Past Medical History  Past Medical History:  Diagnosis Date  . Arthritis    hands  . Complication of anesthesia    lost eyesight for a few days after surgery in the 1950's  . Constipation   . Diabetes mellitus without complication (HCC)    no medications   . Hypertension   . Macular degeneration, bilateral    "one eye wet; one eye dry; she gets shots" (07/07/2017)  . Pneumonia   . Stroke [facility])    some  trouble swallowing    Family History  family history is not on file. She was adopted.  Prior Rehab/Hospitalizations: Had outpatient therapy for right hip pain 01/19  Has the patient had major surgery during 100 days prior to admission? No  Current Medications   Current Facility-Administered Medications:  .  0.9 %  sodium chloride infusion, , Intravenous, Continuous, Lars Mage, [place], Last Rate: 125 mL/hr at 07/08/17 0433 .  acetaminophen (TYLENOL) tablet 325-650 mg, 325-650 mg, Oral, Q6H PRN, Thomasena Edis, Emma M, PA-C .  alum & mag hydroxide-simeth (MAALOX/MYLANTA) 200-200-20 MG/5ML suspension 15-30 mL, 15-30 mL, Oral, Q2H PRN, Thomasena Edis, Emma M, PA-C .  amLODipine (NORVASC) tablet 10 mg, 10 mg, Oral, Daily, Clinton Gallant M, PA-C, 10 mg at 07/08/17 1104 .  bisacodyl (DULCOLAX) EC tablet 5 mg, 5 mg, Oral, Daily PRN, Thomasena Edis, Emma M, PA-C .  clopidogrel (PLAVIX) tablet 75 mg, 75 mg, Oral, Daily, Clinton Gallant M, PA-C, 75 mg at 07/08/17 1104 .  diphenhydrAMINE (BENADRYL) injection 12.5 mg, 12.5 mg, Intravenous, Q6H PRN **OR** diphenhydrAMINE (BENADRYL) 12.5 MG/5ML elixir 12.5 mg, 12.5 mg, Oral, Q6H PRN, Fields, Charles E, MD .  docusate sodium  (COLACE) capsule 100 mg, 100 mg, Oral, Daily, Clinton Gallant M, PA-C, 100 mg at 07/08/17 1103 .  guaiFENesin-dextromethorphan (ROBITUSSIN DM) 100-10 MG/5ML syrup 15 mL, 15 mL, Oral, Q4H PRN, Thomasena Edis, Emma M, PA-C .  heparin injection 5,000 Units, 5,000 Units, Subcutaneous, Q8H, Rhyne, Samantha J, PA-C, 5,000 Units at 07/08/17 1354 .  hydrALAZINE (APRESOLINE) injection 5 mg, 5 mg, Intravenous, Q20 Min PRN, Collins, Emma M, PA-C .  hydrochlorothiazide (MICROZIDE) capsule 12.5 mg, 12.5 mg, Oral, BID, Early, Kristen Loader, MD, 12.5 mg at 07/08/17 1104 .  labetalol (NORMODYNE,TRANDATE) injection 10 mg, 10 mg, Intravenous, Q10 min PRN, Clinton Gallant M, PA-C, 10 mg at 07/06/17 2135 .  magnesium sulfate IVPB 2 g 50 mL, 2 g, Intravenous, Daily PRN, Thomasena Edis, Emma M, PA-C .  metoprolol tartrate (LOPRESSOR) injection 2-5 mg, 2-5 mg, Intravenous, Q2H PRN, Clinton Gallant M, PA-C .  morphine 2 MG/ML injection 0.5-1 mg, 0.5-1 mg, Intravenous, Q1H PRN, Clinton Gallant M, PA-C, 1 mg at 07/07/17 1340 .  morphine 2 mg/mL PCA injection, , Intravenous, Q4H, Fields, Charles E, MD .  multivitamin (PROSIGHT) tablet 1 tablet, 1 tablet, Oral, Daily, Clinton Gallant M, PA-C, 1 tablet at 07/08/17 1103 .  naloxone (NARCAN) injection 0.4 mg, 0.4 mg, Intravenous, PRN **AND** sodium chloride flush (NS) 0.9 % injection 9 mL, 9 mL, Intravenous, PRN, Fields, Charles E, MD .  ondansetron [facility]) injection 4 mg, 4 mg, Intravenous, Q6H PRN, Fields, Janetta Hora, MD .  oxyCODONE-acetaminophen (PERCOCET/ROXICET) 5-325 MG per tablet 1-2 tablet, 1-2 tablet, Oral, Q4H PRN, Lars Mage, PA-C, 1 tablet at 07/08/17 1103 .  pantoprazole (PROTONIX) EC tablet 40 mg, 40 mg, Oral, Daily, Clinton Gallant M, PA-C, 40 mg at 07/08/17 1103 .  phenol (CHLORASEPTIC) mouth spray 1 spray, 1 spray, Mouth/Throat, PRN, Thomasena Edis, Emma M, PA-C .  potassium chloride SA (K-DUR,KLOR-CON) CR tablet 20-40 mEq, 20-40 mEq, Oral, Daily PRN, Thomasena Edis, Emma M, PA-C .  senna-docusate  (Senokot-S) tablet 1 tablet, 1 tablet, Oral, QHS PRN, Clinton Gallant M, PA-C .  traMADol Janean Sark) tablet 50-100 mg, 50-100 mg, Oral, Q4H PRN, Clinton Gallant M, PA-C, 100 mg at 07/06/17 1610  Patients Current Diet: Diet Heart Room service appropriate? Yes; Fluid consistency: Thin  Precautions / Restrictions Precautions Precautions: Fall Restrictions Weight Bearing Restrictions: No   Has the patient had  2 or more falls or a fall with injury in the past year?No.  Family reports 1 fall last week.  Prior Activity Level Limited Community (1-2x/wk): Went out about 1 X a week  Journalist, newspaper / Equipment Home Assistive Devices/Equipment: Environmental consultant (specify type), Eyeglasses, Grab bars in shower, Grab bars around toilet, Built-in shower seat, Shower chair with back Home Equipment: Grab bars - toilet, Grab bars - tub/shower, Shower seat, Environmental consultant - 2 wheels, Cane - single point  Prior Device Use: Indicate devices/aids used by the patient prior to current illness, exacerbation or injury? Walker  Prior Functional Level Prior Function Level of Independence: Independent with assistive device(s) Comments: Has been using RW since January; mod indep with this and ADLs  Self Care: Did the patient need help bathing, dressing, using the toilet or eating?  Independent  Indoor Mobility: Did the patient need assistance with walking from room to room (with or without device)? Independent  Stairs: Did the patient need assistance with internal or external stairs (with or without device)? Independent  Functional Cognition: Did the patient need help planning regular tasks such as shopping or remembering to take medications? Independent  Current Functional Level Cognition  Overall Cognitive Status: Impaired/Different from baseline Current Attention Level: Focused Orientation Level: Oriented to person, Oriented to place, Disoriented to time, Disoriented to situation Following Commands: Follows one step  commands with increased time General Comments: Pt very lethargic with eyes closed majority of session, requiring max multimodal cues to participate, suspect due to pain medications (RN aware)    Extremity Assessment (includes Sensation/Coordination)  Upper Extremity Assessment: Overall WFL for tasks assessed  Lower Extremity Assessment: LLE deficits/detail LLE Deficits / Details: s/p L BKA; hip flex/ADD/ABD grossly 3/5 LLE: Unable to fully assess due to pain    ADLs  Overall ADL's : Needs assistance/impaired Eating/Feeding: Sitting, Maximal assistance Grooming: Brushing hair, Sitting, Total assistance Upper Body Bathing: Total assistance, Bed level Lower Body Bathing: Bed level, Total assistance Upper Body Dressing : Bed level, Maximal assistance Lower Body Dressing: Total assistance, Bed level Toileting- Clothing Manipulation and Hygiene: Total assistance, Bed level General ADL Comments: Requiring total assist, unable to participate due to lethargy.    Mobility  Overal bed mobility: Needs Assistance Bed Mobility: Rolling, Sidelying to Sit Rolling: Max assist Sidelying to sit: Max assist, +2 for physical assistance, HOB elevated Supine to sit: Max assist, +2 for physical assistance Sit to supine: Max assist, +2 for physical assistance General bed mobility comments: MaxA+2 for bed mobility to sit EOB; pt very lethargic this session and not providing as much assist. Use of bed pad to help reposition hips at EOB    Transfers  Overall transfer level: Needs assistance Equipment used: 2 person hand held assist Transfers: Lateral/Scoot Transfers Sit to Stand: Max assist, +2 physical assistance  Lateral/Scoot Transfers: +2 safety/equipment, Total assist General transfer comment: no attempt to stand, did not participate in transfer to chair    Ambulation / Gait / Stairs / Engineer, drilling / Balance Dynamic Sitting Balance Sitting balance - Comments: Reliant on BUEs  to maintain seated balance and min-maxA (suspect due to lethargy) Balance Overall balance assessment: Needs assistance Sitting-balance support: Bilateral upper extremity supported Sitting balance-Leahy Scale: Poor Sitting balance - Comments: Reliant on BUEs to maintain seated balance and min-maxA (suspect due to lethargy) Postural control: Posterior lean    Special needs/care consideration BiPAP/CPAP No CPM No Continuous Drip IV KVO Dialysis No  Life Vest No Oxygen No Special Bed No Trach Size No Wound Vac (area) No    Skin New left BKA surgical site with dressing.                             Bowel mgmt: Last BM 07/05/17 Bladder mgmt: External catheter Diabetic mgmt Borderline diabetic    Previous Home Environment Living Arrangements: Alone Available Help at Discharge: Family, Available 24 hours/day Type of Home: Other(Comment)(Condo) Home Layout: One level Home Access: Stairs to enter(planning to have ramp built) Secretary/administratorntrance Stairs-Number of Steps: 2 Bathroom Shower/Tub: Health visitorWalk-in shower Bathroom Toilet: Standard Home Care Services: Yes Type of Home Care Services: Meals on wheels, Housekeeping Home Care Agency (if known): "private pay housekeeping"  Discharge Living Setting Plans for Discharge Living Setting: Alone, Other (Comment)(Lives alone in a townhouse.) Type of Home at Discharge: Other (Comment)(Townhouse) Discharge Home Layout: One level Discharge Home Access: Stairs to enter(Family will build a ramp) Entrance Stairs-Number of Steps: 2 Does the patient have any problems obtaining your medications?: No  Social/Family/Support Systems Patient Roles: Parent, Other (Comment)(Has 3 daughters and a grand daughter.) Contact Information: Vivien PrestoKaron Tucker - daughter - [phone] Anticipated Caregiver: Daughters, grand daughter and may hire help as needed Ability/Limitations of Caregiver: Daughters can rotate and assist.  Kris MoutonGrand daughter can assist.  Can hire help since she  has a long term care policy. Caregiver Availability: 24/7 Discharge Plan Discussed with Primary Caregiver: Yes Is Caregiver In Agreement with Plan?: Yes Does Caregiver/Family have Issues with Lodging/Transportation while Pt is in Rehab?: No  Goals/Additional Needs Patient/Family Goal for Rehab: PT mod I and supervision, OT mod I to supervision to min assist goals Expected length of stay: 10-18 days Cultural Considerations: [facility] Dietary Needs: Heart diet, thin liquids Equipment Needs: TBD Pt/Family Agrees to Admission and willing to participate: Yes Program Orientation Provided & Reviewed with Pt/Caregiver Including Roles  & Responsibilities: Yes  Decrease burden of Care through IP rehab admission: N/A  Possible need for SNF placement upon discharge: Not anticipated  Patient Condition: This patient's medical and functional status has changed since the consult dated: 07/07/17 in which the Rehabilitation Physician determined and documented that the patient's condition is appropriate for intensive rehabilitative care in an inpatient rehabilitation facility. See "History of Present Illness" (above) for medical update. Functional changes are:  Currently requiring max assist +2 for transfers. Patient's medical and functional status update has been discussed with the Rehabilitation physician and patient remains appropriate for inpatient rehabilitation. Will admit to inpatient rehab on 07/10/17.  Preadmission Screen Completed By:  Trish MageLogue, Pratt Bress M, 07/08/2017 2:02 PM ______________________________________________________________________   Discussed status with Dr. Riley KillSwartz on 07/08/17 at 1400 and received telephone approval for admission on 07/10/17.  Admission Coordinator:  Trish MageLogue, Neylan Koroma M, time 1402/Date 07/08/17

## 2017-07-08 NOTE — Progress Notes (Signed)
Rehab admissions - Dr. Riley KillSwartz from inpatient rehab says patient not ready for inpatient rehab today.  I will have a bed this Sunday and can plan admit for Sunday 07/10/17.  I have called and updated PA and unit case manager.  Call me for questions.  #[phone]

## 2017-07-08 NOTE — Discharge Instructions (Signed)
Will need CBC and BMP on 07/09/17 if discharged to CIR

## 2017-07-08 NOTE — Telephone Encounter (Signed)
Left pt vm regarding 08/09/17 appt. Let her know her 07/12/17 appt has been cancelled and replaced with new one. Mailed letter.

## 2017-07-08 NOTE — Plan of Care (Signed)
  Problem: Education: Goal: Knowledge of General Education information will improve Outcome: Progressing   Problem: Health Behavior/Discharge Planning: Goal: Ability to manage health-related needs will improve Outcome: Progressing   Problem: Clinical Measurements: Goal: Ability to maintain clinical measurements within normal limits will improve Outcome: Progressing Goal: Will remain free from infection Outcome: Progressing   

## 2017-07-09 LAB — CBC
HCT: 39.9 % (ref 36.0–46.0)
Hemoglobin: 13 g/dL (ref 12.0–15.0)
MCH: 29.2 pg (ref 26.0–34.0)
MCHC: 32.6 g/dL (ref 30.0–36.0)
MCV: 89.7 fL (ref 78.0–100.0)
Platelets: 192 10*3/uL (ref 150–400)
RBC: 4.45 MIL/uL (ref 3.87–5.11)
RDW: 14.2 % (ref 11.5–15.5)
WBC: 9 10*3/uL (ref 4.0–10.5)

## 2017-07-09 LAB — BASIC METABOLIC PANEL
Anion gap: 15 (ref 5–15)
BUN: 19 mg/dL (ref 6–20)
CO2: 18 mmol/L — ABNORMAL LOW (ref 22–32)
Calcium: 8 mg/dL — ABNORMAL LOW (ref 8.9–10.3)
Chloride: 100 mmol/L — ABNORMAL LOW (ref 101–111)
Creatinine, Ser: 1.34 mg/dL — ABNORMAL HIGH (ref 0.44–1.00)
GFR calc Af Amer: 40 mL/min — ABNORMAL LOW (ref >60)
GFR calc non Af Amer: 34 mL/min — ABNORMAL LOW (ref >60)
Glucose, Bld: 108 mg/dL — ABNORMAL HIGH (ref 65–99)
Potassium: 3.5 mmol/L (ref 3.5–5.1)
Sodium: 133 mmol/L — ABNORMAL LOW (ref 135–145)

## 2017-07-09 MED ORDER — HYDROMORPHONE HCL 1 MG/ML IJ SOLN: 0.0500 mg | INTRAMUSCULAR | Status: DC | PRN

## 2017-07-09 MED ORDER — ONDANSETRON HCL 4 MG/2ML IJ SOLN: 4.0000 mg | Freq: Four times a day (QID) | INTRAMUSCULAR | Status: DC

## 2017-07-09 MED ORDER — ONDANSETRON HCL 4 MG/2ML IJ SOLN
4.0000 mg | Freq: Four times a day (QID) | INTRAMUSCULAR | Status: DC | PRN
Start: 2017-07-09 — End: 2017-07-10
  Administered 2017-07-09: 4 mg via INTRAVENOUS
  Filled 2017-07-09: qty 2

## 2017-07-09 NOTE — Progress Notes (Signed)
10 ml of Morphine PCA injection wasted in sharps with charge nurse Sander Nepheweresa Mainero, RN.

## 2017-07-09 NOTE — Progress Notes (Addendum)
  Progress Note    07/09/2017 7:31 AM 3 Days Post-Op  Subjective:  C/o pain in her leg  Tm 99.9 90's-110's NSR 140's-160's systolic 97% 3LO2NC  Vitals:   07/09/17 0000 07/09/17 0350  BP: (!) 167/71 (!) 150/87  Pulse:  (!) 113  Resp: 15 16  Temp:  99.9 F (37.7 C)  SpO2: 97% 97%    Physical Exam: Incisions:  Bandage is clean and dry   CBC    Component Value Date/Time   WBC 9.0 07/09/2017 0329   RBC 4.45 07/09/2017 0329   HGB 13.0 07/09/2017 0329   HCT 39.9 07/09/2017 0329   PLT 192 07/09/2017 0329   MCV 89.7 07/09/2017 0329   MCH 29.2 07/09/2017 0329   MCHC 32.6 07/09/2017 0329   RDW 14.2 07/09/2017 0329   LYMPHSABS 1.9 04/27/2014 1705   MONOABS 0.5 04/27/2014 1705   EOSABS 0.2 04/27/2014 1705   BASOSABS 0.0 04/27/2014 1705    BMET    Component Value Date/Time   NA 133 (L) 07/09/2017 0329   K 3.5 07/09/2017 0329   CL 100 (L) 07/09/2017 0329   CO2 18 (L) 07/09/2017 0329   GLUCOSE 108 (H) 07/09/2017 0329   BUN 19 07/09/2017 0329   CREATININE 1.34 (H) 07/09/2017 0329   CALCIUM 8.0 (L) 07/09/2017 0329   GFRNONAA 34 (L) 07/09/2017 0329   GFRAA 40 (L) 07/09/2017 0329    INR No results found for: INR   Intake/Output Summary (Last 24 hours) at 07/09/2017 0731 Last data filed at 07/09/2017 [id number] Gross per 24 hour  Intake 3170 ml  Output 900 ml  Net 2270 ml     Assessment/Plan:  84 y.o. female is s/p left below knee amputation  3 Days Post-Op  -per daughter, pt had a good night and used the PCA at 2000 last night x 1.  She has had percocet this am.  Will dc the PCA and use oral meds -mild tachycardia this am-most likely due to pain -creatinine is improved this morning.  Pt is also able to void after needing I&O cath.   D/c IVF -pt with low grade fever 99.9-most likely atelectasis.  IS 10x/hr and mobilize pt. -possibly to CIR tomorrow   Doreatha MassedSamantha Rhyne, PA-C [facility] [phone] 07/09/2017 7:31 AM  Pain slowly improving.  Now off IV pain meds. Rehab tomorrow  Fabienne Brunsharles Jakhi Dishman, MD [facility] [place] Office: [phone] Pager: [phone]

## 2017-07-10 ENCOUNTER — Inpatient Hospital Stay (HOSPITAL_COMMUNITY): Payer: Medicare Other | Admitting: Occupational Therapy

## 2017-07-10 ENCOUNTER — Inpatient Hospital Stay (HOSPITAL_COMMUNITY): Payer: Medicare Other | Admitting: Physical Therapy

## 2017-07-10 ENCOUNTER — Inpatient Hospital Stay (HOSPITAL_COMMUNITY)
Admission: RE | Admit: 2017-07-10 | Discharge: 2017-07-14 | DRG: 561 | Disposition: A | Discharge status: Home Health | Payer: Medicare Other | Source: Intra-hospital | Attending: Physical Medicine & Rehabilitation | Admitting: Physical Medicine & Rehabilitation

## 2017-07-10 DIAGNOSIS — N390 Urinary tract infection, site not specified: Secondary | ICD-10-CM | POA: Diagnosis not present

## 2017-07-10 DIAGNOSIS — M19041 Primary osteoarthritis, right hand: Secondary | ICD-10-CM | POA: Diagnosis present

## 2017-07-10 DIAGNOSIS — E639 Nutritional deficiency, unspecified: Secondary | ICD-10-CM

## 2017-07-10 DIAGNOSIS — E876 Hypokalemia: Secondary | ICD-10-CM | POA: Diagnosis not present

## 2017-07-10 DIAGNOSIS — K5900 Constipation, unspecified: Secondary | ICD-10-CM | POA: Diagnosis not present

## 2017-07-10 DIAGNOSIS — H3530 Unspecified macular degeneration: Secondary | ICD-10-CM | POA: Diagnosis present

## 2017-07-10 DIAGNOSIS — G479 Sleep disorder, unspecified: Secondary | ICD-10-CM | POA: Diagnosis not present

## 2017-07-10 DIAGNOSIS — I739 Peripheral vascular disease, unspecified: Secondary | ICD-10-CM | POA: Diagnosis not present

## 2017-07-10 DIAGNOSIS — Z888 Allergy status to other drugs, medicaments and biological substances status: Secondary | ICD-10-CM

## 2017-07-10 DIAGNOSIS — G8918 Other acute postprocedural pain: Secondary | ICD-10-CM | POA: Diagnosis not present

## 2017-07-10 DIAGNOSIS — Z9842 Cataract extraction status, left eye: Secondary | ICD-10-CM | POA: Diagnosis not present

## 2017-07-10 DIAGNOSIS — E669 Obesity, unspecified: Secondary | ICD-10-CM | POA: Diagnosis present

## 2017-07-10 DIAGNOSIS — N183 Chronic kidney disease, stage 3 unspecified: Secondary | ICD-10-CM

## 2017-07-10 DIAGNOSIS — L89613 Pressure ulcer of right heel, stage 3: Secondary | ICD-10-CM | POA: Diagnosis not present

## 2017-07-10 DIAGNOSIS — G546 Phantom limb syndrome with pain: Secondary | ICD-10-CM | POA: Diagnosis not present

## 2017-07-10 DIAGNOSIS — E1122 Type 2 diabetes mellitus with diabetic chronic kidney disease: Secondary | ICD-10-CM | POA: Diagnosis present

## 2017-07-10 DIAGNOSIS — I10 Essential (primary) hypertension: Secondary | ICD-10-CM | POA: Diagnosis not present

## 2017-07-10 DIAGNOSIS — E1151 Type 2 diabetes mellitus with diabetic peripheral angiopathy without gangrene: Secondary | ICD-10-CM | POA: Diagnosis present

## 2017-07-10 DIAGNOSIS — R112 Nausea with vomiting, unspecified: Secondary | ICD-10-CM

## 2017-07-10 DIAGNOSIS — L89619 Pressure ulcer of right heel, unspecified stage: Secondary | ICD-10-CM | POA: Diagnosis not present

## 2017-07-10 DIAGNOSIS — S88112A Complete traumatic amputation at level between knee and ankle, left lower leg, initial encounter: Secondary | ICD-10-CM | POA: Diagnosis not present

## 2017-07-10 DIAGNOSIS — Z7902 Long term (current) use of antithrombotics/antiplatelets: Secondary | ICD-10-CM | POA: Diagnosis not present

## 2017-07-10 DIAGNOSIS — K5903 Drug induced constipation: Secondary | ICD-10-CM

## 2017-07-10 DIAGNOSIS — R1310 Dysphagia, unspecified: Secondary | ICD-10-CM | POA: Diagnosis present

## 2017-07-10 DIAGNOSIS — Z9841 Cataract extraction status, right eye: Secondary | ICD-10-CM

## 2017-07-10 DIAGNOSIS — Z4781 Encounter for orthopedic aftercare following surgical amputation: Secondary | ICD-10-CM | POA: Diagnosis not present

## 2017-07-10 DIAGNOSIS — Z961 Presence of intraocular lens: Secondary | ICD-10-CM | POA: Diagnosis present

## 2017-07-10 DIAGNOSIS — I69391 Dysphagia following cerebral infarction: Secondary | ICD-10-CM | POA: Diagnosis not present

## 2017-07-10 DIAGNOSIS — I129 Hypertensive chronic kidney disease with stage 1 through stage 4 chronic kidney disease, or unspecified chronic kidney disease: Secondary | ICD-10-CM | POA: Diagnosis present

## 2017-07-10 DIAGNOSIS — Z90710 Acquired absence of both cervix and uterus: Secondary | ICD-10-CM

## 2017-07-10 DIAGNOSIS — Z6830 Body mass index (BMI) 30.0-30.9, adult: Secondary | ICD-10-CM

## 2017-07-10 DIAGNOSIS — Z89512 Acquired absence of left leg below knee: Secondary | ICD-10-CM | POA: Diagnosis not present

## 2017-07-10 DIAGNOSIS — I70234 Atherosclerosis of native arteries of right leg with ulceration of heel and midfoot: Secondary | ICD-10-CM | POA: Diagnosis not present

## 2017-07-10 DIAGNOSIS — K5901 Slow transit constipation: Secondary | ICD-10-CM | POA: Diagnosis not present

## 2017-07-10 DIAGNOSIS — E1169 Type 2 diabetes mellitus with other specified complication: Secondary | ICD-10-CM

## 2017-07-10 DIAGNOSIS — R339 Retention of urine, unspecified: Secondary | ICD-10-CM | POA: Diagnosis not present

## 2017-07-10 DIAGNOSIS — E118 Type 2 diabetes mellitus with unspecified complications: Secondary | ICD-10-CM

## 2017-07-10 DIAGNOSIS — S88112S Complete traumatic amputation at level between knee and ankle, left lower leg, sequela: Secondary | ICD-10-CM | POA: Diagnosis not present

## 2017-07-10 DIAGNOSIS — S88119D Complete traumatic amputation at level between knee and ankle, unspecified lower leg, subsequent encounter: Secondary | ICD-10-CM | POA: Diagnosis not present

## 2017-07-10 DIAGNOSIS — M19042 Primary osteoarthritis, left hand: Secondary | ICD-10-CM | POA: Diagnosis present

## 2017-07-10 MED ORDER — HEPARIN SODIUM (PORCINE) 5000 UNIT/ML IJ SOLN: 5000.0000 [IU] | Freq: Three times a day (TID) | INTRAMUSCULAR | Status: DC

## 2017-07-10 MED ORDER — SORBITOL 70 % SOLN
30.0000 mL | Freq: Every day | Status: DC | PRN
  Administered 2017-07-11: 30 mL via ORAL
  Filled 2017-07-10: qty 30

## 2017-07-10 MED ORDER — BISACODYL 5 MG PO TBEC: 5.0000 mg | DELAYED_RELEASE_TABLET | Freq: Every day | ORAL | Status: DC | PRN

## 2017-07-10 MED ORDER — CLOPIDOGREL BISULFATE 75 MG PO TABS
75.0000 mg | ORAL_TABLET | Freq: Every day | ORAL | Status: DC
  Administered 2017-07-11 – 2017-07-13 (×3): 75 mg via ORAL
  Filled 2017-07-10 (×3): qty 1

## 2017-07-10 MED ORDER — OXYCODONE-ACETAMINOPHEN 5-325 MG PO TABS
1.0000 | ORAL_TABLET | ORAL | Status: DC | PRN
  Administered 2017-07-10: 2 via ORAL
  Administered 2017-07-10 – 2017-07-11 (×3): 1 via ORAL
  Administered 2017-07-11: 2 via ORAL
  Filled 2017-07-10: qty 2
  Filled 2017-07-10 (×3): qty 1
  Filled 2017-07-10 (×2): qty 2

## 2017-07-10 MED ORDER — ONDANSETRON HCL 4 MG PO TABS
4.0000 mg | ORAL_TABLET | Freq: Four times a day (QID) | ORAL | Status: DC | PRN
  Administered 2017-07-12 (×2): 4 mg via ORAL
  Filled 2017-07-10 (×2): qty 1

## 2017-07-10 MED ORDER — ONDANSETRON HCL 4 MG/2ML IJ SOLN: 4.0000 mg | Freq: Four times a day (QID) | INTRAMUSCULAR | Status: DC | PRN

## 2017-07-10 MED ORDER — DOCUSATE SODIUM 100 MG PO CAPS
100.0000 mg | ORAL_CAPSULE | Freq: Every day | ORAL | Status: DC
Start: 2017-07-10 — End: 2017-07-14
  Administered 2017-07-10 – 2017-07-13 (×4): 100 mg via ORAL
  Filled 2017-07-10 (×4): qty 1

## 2017-07-10 MED ORDER — PANTOPRAZOLE SODIUM 40 MG PO TBEC
40.0000 mg | DELAYED_RELEASE_TABLET | Freq: Every day | ORAL | Status: DC
  Administered 2017-07-11: 40 mg via ORAL
  Filled 2017-07-10: qty 1

## 2017-07-10 MED ORDER — HEPARIN SODIUM (PORCINE) 5000 UNIT/ML IJ SOLN
5000.0000 [IU] | Freq: Three times a day (TID) | INTRAMUSCULAR | Status: DC
  Administered 2017-07-10 – 2017-07-14 (×11): 5000 [IU] via SUBCUTANEOUS
  Filled 2017-07-10 (×10): qty 1

## 2017-07-10 MED ORDER — ACETAMINOPHEN 325 MG PO TABS
325.0000 mg | ORAL_TABLET | Freq: Four times a day (QID) | ORAL | Status: DC | PRN
  Administered 2017-07-12 – 2017-07-14 (×4): 650 mg via ORAL
  Filled 2017-07-10 (×5): qty 2

## 2017-07-10 MED ORDER — PROSIGHT PO TABS
1.0000 | ORAL_TABLET | Freq: Every day | ORAL | Status: DC
  Administered 2017-07-11: 1 via ORAL
  Filled 2017-07-10: qty 1

## 2017-07-10 MED ORDER — TRAMADOL HCL 50 MG PO TABS
50.0000 mg | ORAL_TABLET | ORAL | Status: DC | PRN
Start: 2017-07-10 — End: 2017-07-10

## 2017-07-10 MED ORDER — PANTOPRAZOLE SODIUM 40 MG PO TBEC: 40.0000 mg | DELAYED_RELEASE_TABLET | Freq: Every day | ORAL | Status: DC

## 2017-07-10 MED ORDER — TRAMADOL HCL 50 MG PO TABS
50.0000 mg | ORAL_TABLET | Freq: Two times a day (BID) | ORAL | Status: DC | PRN
  Administered 2017-07-10: 50 mg via ORAL
  Administered 2017-07-11 – 2017-07-12 (×3): 100 mg via ORAL
  Administered 2017-07-13: 50 mg via ORAL
  Administered 2017-07-13: 100 mg via ORAL
  Filled 2017-07-10 (×2): qty 2
  Filled 2017-07-10: qty 1
  Filled 2017-07-10 (×4): qty 2

## 2017-07-10 MED ORDER — HYDROCHLOROTHIAZIDE 12.5 MG PO CAPS
12.5000 mg | ORAL_CAPSULE | Freq: Two times a day (BID) | ORAL | Status: DC
  Administered 2017-07-10 – 2017-07-13 (×7): 12.5 mg via ORAL
  Filled 2017-07-10 (×7): qty 1

## 2017-07-10 MED ORDER — AMLODIPINE BESYLATE 10 MG PO TABS
10.0000 mg | ORAL_TABLET | Freq: Every day | ORAL | Status: DC
  Administered 2017-07-11 – 2017-07-13 (×3): 10 mg via ORAL
  Filled 2017-07-10 (×3): qty 1

## 2017-07-10 NOTE — Progress Notes (Signed)
Patient and daughter given discharge instructions and expectations for IP Rehab.  Questions answered and verbalized understanding.  Report called to Ed, RN [facility].

## 2017-07-10 NOTE — Plan of Care (Signed)
Patient is progressing and is ready for discharge.

## 2017-07-10 NOTE — Plan of Care (Signed)
Care plan reviewed and patient is progressing.  

## 2017-07-10 NOTE — Plan of Care (Signed)
Reviewed care plan, patient is progressing 

## 2017-07-10 NOTE — Progress Notes (Signed)
[facility] [place]  Subjective  - pain slightly improved   Objective 133/62 (!) 105 99.9 F (37.7 C) (Axillary) 20 94%  Intake/Output Summary (Last 24 hours) at 07/10/2017 0929 Last data filed at 07/10/2017 [id number] Gross per 24 hour  Intake 460 ml  Output 1350 ml  Net -890 ml   Left BKA small amount of serous drainage laterally skin edges viable  Assessment/Planning: Slowly healing BKA.  Slowly resolving pain from BKA  To Rehab today  [name] BrunsCharles Cassandre Petersen 07/10/2017 9:29 AM --  Laboratory Lab Results: Recent Labs    07/08/17 0332 07/09/17 0329  WBC 8.5 9.0  HGB 12.2 13.0  HCT 38.0 39.9  PLT 184 192   BMET Recent Labs    07/08/17 0332 07/09/17 0329  NA 134* 133*  K 4.1 3.5  CL 102 100*  CO2 19* 18*  GLUCOSE 69 108*  BUN 24* 19  CREATININE 1.97* 1.34*  CALCIUM 7.9* 8.0*    COAG No results found for: INR, PROTIME No results found for: PTT

## 2017-07-10 NOTE — H&P (Signed)
Physical Medicine and Rehabilitation Admission H&P        Chief complaint: Stump pain     HPI: [name] Petersen is a 84 y.o. right-handed female with history of CVA maintained on Plavix, hypertension, diet-controlled diabetes mellitus,CKD stage III.  Per chart review patient lives alone.  She was independent up until January and then began using a walker.  One level home.  She does not drive.  She has 3 daughters who can assist as needed.  Presented 07/06/2017 with critical limb ischemia left lower extremity with unreconstructable distal tibial disease.  No change with conservative care.  Underwent left BKA 07/06/2017 per Dr. Arbie Cookey.  Hospital course pain management.  Monitoring of creatinine 1.97 from baseline 1.30-1.59 and Cozaar was held.  Subcutaneous heparin for DVT prophylaxis.  Physical and occupational therapy evaluations completed with recommendations of physical medicine rehab consult.  Patient was admitted for a comprehensive rehab program.     Review of Systems   Constitutional: Negative for chills and fever.   HENT: Negative for hearing loss.    Eyes: Negative for blurred vision and double vision.   Respiratory: Negative for cough and shortness of breath.    Cardiovascular: Positive for leg swelling. Negative for chest pain and palpitations.   Gastrointestinal: Positive for constipation. Negative for nausea.   Genitourinary: Negative for flank pain and hematuria.   Musculoskeletal: Positive for joint pain and myalgias.   Skin: Negative for rash.   Neurological: Negative for seizures.   All other systems reviewed and are negative.      Past Medical History:  Diagnosis Date  . Arthritis    hands  . Complication of anesthesia    lost eyesight for a few days after surgery in the 1950's  . Constipation   . Diabetes mellitus without complication (HCC)    no medications   . Hypertension   . Macular degeneration, bilateral    "one eye wet; one eye dry; she gets  shots" (07/07/2017)  . Pneumonia   . Stroke [facility])    some trouble swallowing         Past Surgical History:  Procedure Laterality Date  . ABDOMINAL AORTOGRAM W/LOWER EXTREMITY N/A 06/16/2017   Procedure: ABDOMINAL AORTOGRAM W/LOWER EXTREMITY;  Surgeon: Fransisco Hertz, MD;  Location: [facility] INVASIVE CV LAB;  Service: Cardiovascular;  Laterality: N/A;  lt extermity  . ABDOMINAL HYSTERECTOMY    . AMPUTATION Left 07/06/2017   Procedure: AMPUTATION BELOW KNEE LEFT;  Surgeon: Larina Earthly, MD;  Location: [facility] OR;  Service: Vascular;  Laterality: Left;  . CATARACT EXTRACTION W/ INTRAOCULAR LENS  IMPLANT, BILATERAL    . DILATION AND CURETTAGE OF UTERUS     Family History  Adopted: Yes    Social History   Tobacco Use  . Smoking status: Never Smoker  . Smokeless tobacco: Never Used  Substance Use Topics  . Alcohol use: Never    Frequency: Never  . Drug use: Never    Allergies  Allergen Reactions  . Scopace [Scopolamine] Other (See Comments)    Hallucinations      Medications Prior to Admission  Medication Sig Dispense Refill  . acetaminophen (TYLENOL) 325 MG tablet Take 325-650 mg by mouth every 6 (six) hours as needed for moderate pain or headache.    Marland Kitchen amLODipine (NORVASC) 10 MG tablet Take 10 mg by mouth daily.    . beta carotene w/minerals (OCUVITE) tablet Take 1 tablet by mouth daily.    . clopidogrel (PLAVIX) 75  MG tablet Take 75 mg by mouth daily.    Marland Kitchen. oxyCODONE-acetaminophen (PERCOCET/ROXICET) 5-325 MG tablet Take 1 tablet by mouth every 6 (six) hours as needed for moderate pain.       Drug Regimen Review  Drug regimen was reviewed and remains appropriate with no significant issues identified     Home:  Home Living  Family/patient expects to be discharged to:: Inpatient rehab  Living Arrangements: Alone  Available Help at Discharge: Family, Available 24 hours/day  Type of Home: Other(Comment)(Condo)  Home Access: Stairs to enter(planning to have ramp  built)  Secretary/administratorntrance Stairs-Number of Steps: 2  Home Layout: One level  Bathroom Shower/Tub: Pension scheme managerWalk-in shower  Bathroom Toilet: Standard  Home Equipment: Grab bars - toilet, Grab bars - tub/shower, Shower seat, Environmental consultantWalker - 2 wheels, Cane - single point     Functional History:  Prior Function  Level of Independence: Independent with assistive device(s)  Comments: Has been using RW since January; mod indep with this and ADLs     Functional Status:   Mobility:  Bed Mobility  Overal bed mobility: Needs Assistance  Bed Mobility: Rolling, Supine to Sit, Sit to Supine  Rolling: Mod assist, Max assist  Supine to sit: Max assist, +2 for physical assistance  Sit to supine: Max assist, +2 for physical assistance  General bed mobility comments: ModA to roll to L-side, maxA to roll to R-side; cues to use rail to self assist with rolling. MaxA for sit<>supine and use of bed pad to position hips at EOB  Transfers  General transfer comment: Deferred secondary to pain and lethargy     ADL:  ADL  Overall ADL's : Needs assistance/impaired  Eating/Feeding: Sitting, Maximal assistance  Grooming: Brushing hair, Sitting, Total assistance  Upper Body Bathing: Total assistance, Bed level  Lower Body Bathing: Bed level, Total assistance  Upper Body Dressing : Bed level, Maximal assistance  Lower Body Dressing: Total assistance, Bed level  Toileting- Clothing Manipulation and Hygiene: Total assistance, Bed level     Cognition:  Cognition  Overall Cognitive Status: Impaired/Different from baseline  Orientation Level: Oriented to person, Oriented to place, Disoriented to time, Disoriented to situation  Cognition  Arousal/Alertness: Lethargic, Suspect due to medications  Behavior During Therapy: Anxious  Overall Cognitive Status: Impaired/Different from baseline  Area of Impairment: Following commands, Problem solving, Memory, Attention  Current Attention Level:  Focused  Memory: Decreased short-term memory  Following Commands: Follows one step commands with increased time(and multimodal cues)  Problem Solving: Slow processing, Decreased initiation, Difficulty sequencing, Requires verbal cues, Requires tactile cues  General Comments: Suspect apparent cognitive deficits secondary to PCA pump medication for pain as pt very lethargic     Physical Exam:  Blood pressure 140/65, pulse 65, temperature 99.9 F (37.7 C), temperature source Axillary, resp. rate (!) 9, weight 76.2 kg (168 lb), SpO2 97 %.  Physical Exam   HENT:   Head: Normocephalic.   Eyes: Right eye exhibits no discharge. Left eye exhibits no discharge.  Pupils reactive to light   Neck: Normal range of motion. Neck supple. No thyromegaly present.   Cardiovascular: Normal rate and regular rhythm.   Respiratory:  Limited inspiratory effort but clear to auscultation   GI: Soft. Bowel sounds are normal. She exhibits no distension.   Skin.  Amputation site is dressed appropriately tender  Neurological.  Patient is lethargic but arousable.  She would fall back asleep during exam but was able to provide her name and age.  Follows simple commands  Lab Results  Component Value Date   WBC 9.0 07/09/2017   HGB 13.0 07/09/2017   HCT 39.9 07/09/2017   PLT 192 07/09/2017   GLUCOSE 108 (H) 07/09/2017   ALT 22 07/06/2017   AST 28 07/06/2017   NA 133 (L) 07/09/2017   K 3.5 07/09/2017   CL 100 (L) 07/09/2017   CREATININE 1.34 (H) 07/09/2017   BUN 19 07/09/2017   CO2 18 (L) 07/09/2017   HGBA1C 6.0 (H) 07/06/2017     Medical Problem List and Plan:  1.  Decreased functional mobility secondary to left BKA secondary to peripheral vascular disease 07/06/2017  2.  DVT Prophylaxis/Anticoagulation: Subcutaneous heparin. Monitor for any bleeding episodes  3. Pain Management: Oxycodone/Ultram as needed  4. Mood: Provide emotional support  5. Neuropsych: This patient is capable  of making decisions on her own behalf.  6. Skin/Wound Care: Routine skin checks  7. Fluids/Electrolytes/Nutrition: Routine INO's with follow-up chemistries  8.  CKD stage III.  Creatinine baseline 1.30-1.59.  Follow-up chemistries.  Cozaar was held due to increasing creatinine  9.  Hypertension.  Norvasc 10 mg daily, HCTZ 12.5 mg twice daily. Monitor with increased mobility  10.  History of CVA.  Continue Plavix.  11.  Diet controlled diabetes mellitus.  Hemoglobin A1c 6.0.  Blood sugars 64-124.  CBGs discontinued     Rene Paci, MD 07/10/17 5:28 PM   Mcarthur Rossetti Angiulli, PA-C  07/08/2017

## 2017-07-11 ENCOUNTER — Inpatient Hospital Stay (HOSPITAL_COMMUNITY): Payer: Medicare Other | Admitting: Physical Therapy

## 2017-07-11 ENCOUNTER — Encounter (HOSPITAL_COMMUNITY): Payer: Self-pay

## 2017-07-11 ENCOUNTER — Inpatient Hospital Stay (HOSPITAL_COMMUNITY): Payer: Medicare Other

## 2017-07-11 ENCOUNTER — Other Ambulatory Visit: Payer: Self-pay

## 2017-07-11 ENCOUNTER — Inpatient Hospital Stay (HOSPITAL_COMMUNITY): Payer: Medicare Other | Admitting: Occupational Therapy

## 2017-07-11 DIAGNOSIS — N183 Chronic kidney disease, stage 3 unspecified: Secondary | ICD-10-CM

## 2017-07-11 DIAGNOSIS — E669 Obesity, unspecified: Secondary | ICD-10-CM

## 2017-07-11 DIAGNOSIS — G8918 Other acute postprocedural pain: Secondary | ICD-10-CM

## 2017-07-11 DIAGNOSIS — Z89512 Acquired absence of left leg below knee: Secondary | ICD-10-CM

## 2017-07-11 DIAGNOSIS — E1169 Type 2 diabetes mellitus with other specified complication: Secondary | ICD-10-CM

## 2017-07-11 DIAGNOSIS — I10 Essential (primary) hypertension: Secondary | ICD-10-CM

## 2017-07-11 LAB — COMPREHENSIVE METABOLIC PANEL WITH GFR
ALT: 23 U/L (ref 14–54)
AST: 86 U/L — ABNORMAL HIGH (ref 15–41)
Albumin: 2.5 g/dL — ABNORMAL LOW (ref 3.5–5.0)
Alkaline Phosphatase: 74 U/L (ref 38–126)
Anion gap: 11 (ref 5–15)
BUN: 28 mg/dL — ABNORMAL HIGH (ref 6–20)
CO2: 25 mmol/L (ref 22–32)
Calcium: 9 mg/dL (ref 8.9–10.3)
Chloride: 101 mmol/L (ref 101–111)
Creatinine, Ser: 1.24 mg/dL — ABNORMAL HIGH (ref 0.44–1.00)
GFR calc Af Amer: 44 mL/min — ABNORMAL LOW
GFR calc non Af Amer: 38 mL/min — ABNORMAL LOW
Glucose, Bld: 138 mg/dL — ABNORMAL HIGH (ref 65–99)
Potassium: 3.3 mmol/L — ABNORMAL LOW (ref 3.5–5.1)
Sodium: 137 mmol/L (ref 135–145)
Total Bilirubin: 0.8 mg/dL (ref 0.3–1.2)
Total Protein: 5.8 g/dL — ABNORMAL LOW (ref 6.5–8.1)

## 2017-07-11 LAB — CBC WITH DIFFERENTIAL/PLATELET
Basophils Absolute: 0 10*3/uL (ref 0.0–0.1)
Basophils Relative: 0 %
Eosinophils Absolute: 0.3 10*3/uL (ref 0.0–0.7)
Eosinophils Relative: 3 %
HCT: 38.6 % (ref 36.0–46.0)
Hemoglobin: 13.3 g/dL (ref 12.0–15.0)
Lymphocytes Relative: 13 %
Lymphs Abs: 1 10*3/uL (ref 0.7–4.0)
MCH: 29.9 pg (ref 26.0–34.0)
MCHC: 34.5 g/dL (ref 30.0–36.0)
MCV: 86.7 fL (ref 78.0–100.0)
Monocytes Absolute: 1.2 10*3/uL — ABNORMAL HIGH (ref 0.1–1.0)
Monocytes Relative: 15 %
Neutro Abs: 5.7 10*3/uL (ref 1.7–7.7)
Neutrophils Relative %: 69 %
Platelets: 223 10*3/uL (ref 150–400)
RBC: 4.45 MIL/uL (ref 3.87–5.11)
RDW: 14 % (ref 11.5–15.5)
WBC: 8.2 10*3/uL (ref 4.0–10.5)

## 2017-07-11 LAB — GLUCOSE, CAPILLARY: Glucose-Capillary: 116 mg/dL — ABNORMAL HIGH (ref 65–99)

## 2017-07-11 MED ORDER — PROSIGHT PO TABS
1.0000 | ORAL_TABLET | Freq: Every day | ORAL | Status: DC
  Administered 2017-07-12 – 2017-07-13 (×2): 1 via ORAL
  Filled 2017-07-11 (×3): qty 1

## 2017-07-11 MED ORDER — ENSURE ENLIVE PO LIQD
237.0000 mL | Freq: Two times a day (BID) | ORAL | Status: DC
  Administered 2017-07-11 – 2017-07-13 (×5): 237 mL via ORAL

## 2017-07-11 MED ORDER — PANTOPRAZOLE SODIUM 40 MG PO TBEC
40.0000 mg | DELAYED_RELEASE_TABLET | Freq: Every day | ORAL | Status: DC
  Administered 2017-07-12 – 2017-07-13 (×2): 40 mg via ORAL
  Filled 2017-07-11 (×2): qty 1

## 2017-07-11 MED ORDER — FLEET ENEMA 7-19 GM/118ML RE ENEM: 1.0000 | ENEMA | Freq: Every day | RECTAL | Status: DC | PRN

## 2017-07-11 MED ORDER — POTASSIUM CHLORIDE CRYS ER 20 MEQ PO TBCR
20.0000 meq | EXTENDED_RELEASE_TABLET | Freq: Every day | ORAL | Status: DC
  Filled 2017-07-11: qty 1

## 2017-07-11 MED ORDER — POLYETHYLENE GLYCOL 3350 17 G PO PACK
17.0000 g | PACK | Freq: Two times a day (BID) | ORAL | Status: DC
  Administered 2017-07-11 – 2017-07-13 (×5): 17 g via ORAL
  Filled 2017-07-11 (×5): qty 1

## 2017-07-11 MED ORDER — POTASSIUM CHLORIDE 20 MEQ/15ML (10%) PO SOLN
20.0000 meq | Freq: Every day | ORAL | Status: DC
  Administered 2017-07-11 – 2017-07-12 (×2): 20 meq via ORAL
  Filled 2017-07-11 (×2): qty 15

## 2017-07-11 MED ORDER — BISACODYL 10 MG RE SUPP
10.0000 mg | Freq: Every day | RECTAL | Status: DC | PRN
  Administered 2017-07-11 – 2017-07-12 (×2): 10 mg via RECTAL
  Filled 2017-07-11 (×2): qty 1

## 2017-07-11 NOTE — Progress Notes (Signed)
[name] Petersen, [name] T, MD  Physician  Physical Medicine and Rehabilitation  Consult Note  Signed  Date of Service:  07/07/2017 5:32 AM       Related encounter: [facility]      Signed      Expand All Collapse All       [] Hide copied text  [] Hover for details        Physical Medicine and Rehabilitation Consult Reason for Consult: Decreased functional mobility Referring Physician: Dr. Arbie CookeyEarly   HPI: [name] Petersen is a 84 y.o. right-handed female with history of CVA maintained on Plavix, hypertension, diabetes mellitus,CKD stage III.  Per chart review patient lives alone.  She was independent up until January and then began using a walker.  One level home.  She does not drive.  She has 3 daughters who can assist as needed.  Presented 07/06/2017 with critical limb ischemia left lower extremity with unreconstructable distal tibial disease.  No change with conservative care.  Underwent left BKA 07/06/2017 per Dr. Arbie CookeyEarly.  Hospital course pain management.  Monitoring of creatinine 1.69 from baseline 1.30-1.59.  Subcutaneous Lovenox for DVT prophylaxis.  Physical and occupational therapy evaluations pending.  MD has requested physical medicine rehab consult.   Review of Systems  Constitutional: Negative for chills and fever.  HENT: Negative for hearing loss.   Eyes: Negative for blurred vision and double vision.  Respiratory: Negative for cough and shortness of breath.   Cardiovascular: Positive for leg swelling. Negative for chest pain and palpitations.  Gastrointestinal: Positive for constipation. Negative for nausea.  Genitourinary: Negative for flank pain and hematuria.  Musculoskeletal: Positive for joint pain and myalgias.  Skin: Negative for rash.  Neurological: Negative for seizures.  All other systems reviewed and are negative.      Past Medical History:  Diagnosis Date  . Arthritis    hands    . Complication of anesthesia    lost eyesight for a few days after surgery in the 1950's  . Constipation   . Diabetes mellitus without complication (HCC)    no medications   . Hypertension   . Macular degeneration   . Pneumonia   . Stroke [facility](HCC)    some trouble swallowing        Past Surgical History:  Procedure Laterality Date  . ABDOMINAL AORTOGRAM W/LOWER EXTREMITY N/A 06/16/2017   Procedure: ABDOMINAL AORTOGRAM W/LOWER EXTREMITY;  Surgeon: Fransisco Hertzhen, Brian L, MD;  [facility];  Service: Cardiovascular;  Laterality: N/A;  lt extermity  . ABDOMINAL HYSTERECTOMY    . CATARACT EXTRACTION     Family History  Adopted: Yes   Social History:  reports that she has never smoked. She has never used smokeless tobacco. She reports that she does not drink alcohol or use drugs. Allergies:       Allergies  Allergen Reactions  . Scopace [Scopolamine] Other (See Comments)    Hallucinations         Medications Prior to Admission  Medication Sig Dispense Refill  . acetaminophen (TYLENOL) 325 MG tablet Take 325-650 mg by mouth every 6 (six) hours as needed for moderate pain or headache.    Marland Kitchen. amLODipine (NORVASC) 10 MG tablet Take 10 mg by mouth daily.    . beta carotene w/minerals (OCUVITE) tablet Take 1 tablet by mouth daily.    Marland Kitchen. losartan-hydrochlorothiazide (HYZAAR) 50-12.5 MG per tablet Take 1 tablet by mouth 2 (two) times daily.     .Marland Kitchen  traMADol (ULTRAM) 50 MG tablet Take 50-100 mg by mouth every 4 (four) hours as needed for moderate pain.     Marland Kitchen clopidogrel (PLAVIX) 75 MG tablet Take 75 mg by mouth daily.      Home:    Functional History: Functional Status:  Mobility:  ADL:  Cognition: Cognition Orientation Level: Oriented X4  Blood pressure (!) 121/55, pulse 80, temperature (!) 97 F (36.1 C), temperature source Axillary, resp. rate (!) 7, weight 76.2 kg (168 lb), SpO2 92 %. Physical Exam  Vitals reviewed. HENT:  Head:  Normocephalic.  Eyes: EOM are normal.  Neck: Normal range of motion. Neck supple. No thyromegaly present.  Cardiovascular: Normal rate, regular rhythm and normal heart sounds.  Respiratory: Effort normal and breath sounds normal. No respiratory distress.  GI: Soft. Bowel sounds are normal. She exhibits no distension.  Neurological:  Patient is a bit lethargic but arousable.  She would fall back asleep during the exam but was able to provide her name and follow simple commands.  Skin:  Left  BKA site is dressed and appropriately tender.             Assessment/Plan: Diagnosis: PAD s/p left BKA, POD #1 1. Does the need for close, 24 hr/day medical supervision in concert with the patient's rehab needs make it unreasonable for this patient to be served in a less intensive setting? Yes 2. Co-Morbidities requiring supervision/potential complications: hx of CVA, macular degeneration, DM, HTN 3. Due to bladder management, bowel management, safety, skin/wound care, disease management, medication administration, pain management and patient education, does the patient require 24 hr/day rehab nursing? Yes 4. Does the patient require coordinated care of a physician, rehab nurse, PT (1-2 hrs/day, 5 days/week) and OT (1-2 hrs/day, 5 days/week) to address physical and functional deficits in the context of the above medical diagnosis(es)? Yes and Potentially Addressing deficits in the following areas: balance, endurance, locomotion, strength, transferring, bowel/bladder control, bathing, dressing, feeding, grooming, toileting and psychosocial support 5. Can the patient actively participate in an intensive therapy program of at least 3 hrs of therapy per day at least 5 days per week? Yes 6. The potential for patient to make measurable gains while on inpatient rehab is excellent 7. Anticipated functional outcomes upon discharge from inpatient rehab are modified independent and supervision  with PT, modified  independent, supervision and min assist with OT, n/a with SLP. W/C level goals likely 8. Estimated rehab length of stay to reach the above functional goals is: potentially 10-18 days 9. Anticipated D/C setting: Home 10. Anticipated post D/C treatments: HH therapy and Outpatient therapy 11. Overall Rehab/Functional Prognosis: good  RECOMMENDATIONS: This patient's condition is appropriate for continued rehabilitative care in the following setting: CIR Patient has agreed to participate in recommended program. N/A Note that insurance prior authorization may be required for reimbursement for recommended care.  Comment: Spoke with daughter. Pt was ambulatory with walker prior to admit. Has one story house. Family working on ramp to enter. Daughters can help after discharge. Rehab Admissions Coordinator to follow up.  Thanks,  [name] Oyster, MD, [place]     Mcarthur Rossetti Angiulli, PA-C 07/07/2017          Revision History                        Routing History

## 2017-07-11 NOTE — Evaluation (Signed)
Occupational Therapy Assessment and Plan  Patient Details  Name: [name] Petersen MRN: [id number] Date of Birth: 05/18/1926  OT Diagnosis: acute pain and muscle weakness (generalized) Rehab Potential: Rehab Potential (ACUTE ONLY): Good ELOS: 14-18 days   Today's Date: 07/11/2017 OT Individual Time: 1103-1200 OT Individual Time Calculation (min): 57 min     Problem List:  Patient Active Problem List   Diagnosis Date Noted  . Essential hypertension   . Stage 3 chronic kidney disease ([place])   . Postoperative pain   . Diabetes mellitus type 2 in obese ([place])   . S/P below knee amputation, left ([place]) 07/10/2017  . PAD (peripheral artery disease) ([place]) 07/06/2017  . Atherosclerosis of native arteries of the extremities with ulceration ([place]) 06/16/2017    Past Medical History:  Past Medical History:  Diagnosis Date  . Arthritis    hands  . Complication of anesthesia    lost eyesight for a few days after surgery in the 1950's  . Constipation   . Diabetes mellitus without complication (HCC)    no medications   . Hypertension   . Macular degeneration, bilateral    "one eye wet; one eye dry; she gets shots" (07/07/2017)  . Pneumonia   . Stroke [facility])    some trouble swallowing   Past Surgical History:  Past Surgical History:  Procedure Laterality Date  . ABDOMINAL AORTOGRAM W/LOWER EXTREMITY N/A 06/16/2017   Procedure: ABDOMINAL AORTOGRAM W/LOWER EXTREMITY;  Surgeon: Conrad [place], MD;  Location: [place] [facility];  Service: Cardiovascular;  Laterality: N/A;  lt extermity  . ABDOMINAL HYSTERECTOMY    . AMPUTATION Left 07/06/2017   Procedure: AMPUTATION BELOW KNEE LEFT;  Surgeon: Rosetta Posner, MD;  Location: [place];  Service: Vascular;  Laterality: Left;  . CATARACT EXTRACTION W/ INTRAOCULAR LENS  IMPLANT, BILATERAL    . DILATION AND CURETTAGE OF UTERUS      Assessment & Plan Clinical Impression: Patient is a 84 y.o.right-handed female with history of CVA maintained on Plavix,  hypertension, diet-controlled diabetes mellitus,CKD stage III. Per chart review patient lives alone. She was independent up until January and then began using a walker. One level home. She does not drive. She has 3 daughters who can assist as needed. Presented 07/06/2017 with critical limb ischemia left lower extremity with unreconstructable distal tibial disease. No change with conservative care. Underwent left BKA 07/06/2017 per Dr. [name]. Hospital course pain management. Monitoring of creatinine 1.97 from baseline 1.30-1.59 and Cozaar was held. Subcutaneous heparin for DVT prophylaxis.     Patient transferred to CIR on 07/10/2017 .    Patient currently requires max with basic self-care skills secondary to muscle weakness, decreased cardiorespiratoy endurance, decreased awareness, decreased problem solving, decreased safety awareness, decreased memory and delayed processing and decreased sitting balance, decreased standing balance and decreased postural control.  Prior to hospitalization, patient could complete ADLs with modified independent .  Patient will benefit from skilled intervention to decrease level of assist with basic self-care skills prior to discharge home with care partner.  Anticipate patient will require minimal physical assistance and follow up home health.  OT - End of Session Activity Tolerance: Tolerates < 10 min activity with changes in vital signs Endurance Deficit: Yes Endurance Deficit Description: poor tolerance to any activity due to pain, N and V OT Assessment Rehab Potential (ACUTE ONLY): Good OT Barriers to Discharge: Incontinence OT Patient demonstrates impairments in the following area(s): Balance;Cognition;Endurance;Motor;Pain;Safety;Skin Integrity OT Basic ADL's Functional Problem(s): Grooming;Bathing;Dressing;Toileting OT Advanced ADL's  Functional Problem(s): Simple Meal Preparation OT Transfers Functional Problem(s): Toilet;Tub/Shower OT Additional  Impairment(s): None OT Plan OT Intensity: Minimum of 1-2 x/day, 45 to 90 minutes OT Frequency: 5 out of 7 days OT Duration/Estimated Length of Stay: 14-18 days OT Treatment/Interventions: Balance/vestibular training;Cognitive remediation/compensation;Discharge planning;Disease mangement/prevention;DME/adaptive equipment instruction;Functional mobility training;Pain management;Patient/family education;Psychosocial support;Self Care/advanced ADL retraining;Skin care/wound managment;Splinting/orthotics;Therapeutic Activities;Therapeutic Exercise;UE/LE Strength taining/ROM;Wheelchair propulsion/positioning OT Basic Self-Care Anticipated Outcome(s): Min assist OT Toileting Anticipated Outcome(s): Min assist OT Bathroom Transfers Anticipated Outcome(s): Min assist OT Recommendation Patient destination: Home Follow Up Recommendations: Home health OT;24 hour supervision/assistance Equipment Recommended: 3 in 1 bedside comode   Skilled Therapeutic Intervention Limited OT eval due to pain and nausea.  Eval conducted with discussion of rehab process, OT purpose, POC, ELOS, and goals.  ADL assessment completed with bathing at bed level due to pain and nausea with all mobility.  Pt refused OOB activity this session, secondary to N/V during PT session.  RN arrived during session to administer suppository.  Max assist rolling at bed level due to pain with mobility.  Pt fatigued throughout, keeping eyes closed during majority of session.  Daughter present, providing history intermittently and providing encouragement to increase participation and mobility.  OT Evaluation Precautions/Restrictions  Precautions Precautions: Fall Restrictions Weight Bearing Restrictions: Yes LLE Weight Bearing: Non weight bearing General   Vital Signs Therapy Vitals BP: (!) 153/89 Pain Pain Assessment Pain Scale: 0-10 Pain Score: 8  Pain Location: Leg Pain Orientation: Left Pain Intervention(s): Repositioned(no pain  med available yet) Home Living/Prior Functioning Home Living Available Help at Discharge: Family, Available 24 hours/day Type of Home: Other(Comment)(Condo) Home Access: Stairs to enter(planning to have ramp built) Technical brewer of Steps: 2 Home Layout: One level Bathroom Shower/Tub: Walk-in shower, Curtain(has grab bars and shower seat) Bathroom Toilet: Standard  Lives With: Alone IADL History Homemaking Responsibilities: Yes Meal Prep Responsibility: Primary Laundry Responsibility: Primary Cleaning Responsibility: Primary Bill Paying/Finance Responsibility: Primary Shopping Responsibility: No Prior Function Level of Independence: Independent with gait, Independent with basic ADLs, Independent with homemaking with ambulation  Able to Take Stairs?: Yes Driving: No Vocation: Retired Comments: Has been using RW since January; mod indep with this and ADLs ADL  See Function Navigator Vision Baseline Vision/History: Macular Degeneration Patient Visual Report: No change from baseline Vision Assessment?: No apparent visual deficits Cognition Overall Cognitive Status: Within Functional Limits for tasks assessed Arousal/Alertness: Awake/alert Orientation Level: Person;Place;Situation Person: Oriented Place: Oriented Situation: Oriented Year: 2019 Month: March Day of Week: Correct Immediate Memory Recall: Sock;Blue;Bed Memory Recall: Blue(unable to recall sock or bed even when given choice of 3) Memory Recall Blue: With Cue Sensation Sensation Light Touch: Appears Intact Proprioception: Appears Intact Coordination Gross Motor Movements are Fluid and Coordinated: No Fine Motor Movements are Fluid and Coordinated: No Heel Shin Test: NT Motor  Motor Motor: Within Functional Limits Motor - Skilled Clinical Observations: slow movements x 4 extremities Mobility  Bed Mobility Bed Mobility: Rolling Right;Rolling Left;Supine to Sit;Sit to Supine Rolling Right: 2: Max  assist Rolling Right Details: Manual facilitation for weight shifting;Manual facilitation for placement;Verbal cues for sequencing;Verbal cues for technique Rolling Left: 2: Max assist Rolling Left Details: Verbal cues for sequencing;Manual facilitation for placement;Verbal cues for technique;Manual facilitation for weight shifting Right Sidelying to Sit: 2: Max assist;With rails Right Sidelying to Sit Details: Manual facilitation for placement;Manual facilitation for weight shifting;Verbal cues for sequencing;Verbal cues for technique Sit to Supine: 3: Mod assist;With rail Sit to Supine - Details: Manual facilitation for placement;Manual facilitation for weight  shifting  Trunk/Postural Assessment  Cervical Assessment Cervical Assessment: Within Functional Limits Thoracic Assessment Thoracic Assessment: Within Functional Limits Lumbar Assessment Lumbar Assessment: Within Functional Limits Postural Control Postural Control: Deficits on evaluation Righting Reactions: LOB backwards with limited reactions Protective Responses: none to LOB backwards  Balance Balance Balance Assessed: Yes Static Sitting Balance Static Sitting - Level of Assistance: 3: Mod assist Dynamic Sitting Balance Dynamic Sitting - Level of Assistance: Not tested (comment) Static Standing Balance Static Standing - Level of Assistance: Not tested (comment) Dynamic Standing Balance Dynamic Standing - Level of Assistance: Not tested (comment) Extremity/Trunk Assessment RUE Assessment RUE Assessment: Within Functional Limits(strength grossly 4/5) LUE Assessment LUE Assessment: Within Functional Limits(strength grossly 4/5)   See Function Navigator for Current Functional Status.   Refer to Care Plan for Long Term Goals  Recommendations for other services: None    Discharge Criteria: Patient will be discharged from OT if patient refuses treatment 3 consecutive times without medical reason, if treatment goals not  met, if there is a change in medical status, if patient makes no progress towards goals or if patient is discharged from hospital.  The above assessment, treatment plan, treatment alternatives and goals were discussed and mutually agreed upon: by patient and by family  [facility] 07/11/2017, 12:19 PM

## 2017-07-11 NOTE — Evaluation (Addendum)
Physical Therapy Assessment and Plan  Patient Details  Name: [name] Petersen MRN: [id number] Date of Birth: Feb 16, 1927  PT Diagnosis: Impaired cognition, Impaired sensation and Muscle weakness Rehab Potential: Good ELOS: 14-17   Today's Date: 07/11/2017 PT Individual Time: 7564-3329 PT Individual Time Calculation (min): 75 min    Problem List:  Patient Active Problem List   Diagnosis Date Noted  . S/P below knee amputation, left ([place]) 07/10/2017  . PAD (peripheral artery disease) ([place]) 07/06/2017  . Atherosclerosis of native arteries of the extremities with ulceration ([place]) 06/16/2017    Past Medical History:  Past Medical History:  Diagnosis Date  . Arthritis    hands  . Complication of anesthesia    lost eyesight for a few days after surgery in the 1950's  . Constipation   . Diabetes mellitus without complication (HCC)    no medications   . Hypertension   . Macular degeneration, bilateral    "one eye wet; one eye dry; she gets shots" (07/07/2017)  . Pneumonia   . Stroke [facility])    some trouble swallowing   Past Surgical History:  Past Surgical History:  Procedure Laterality Date  . ABDOMINAL AORTOGRAM W/LOWER EXTREMITY N/A 06/16/2017   Procedure: ABDOMINAL AORTOGRAM W/LOWER EXTREMITY;  Surgeon: Conrad Elysian, MD;  Location: [place] [facility];  Service: Cardiovascular;  Laterality: N/A;  lt extermity  . ABDOMINAL HYSTERECTOMY    . AMPUTATION Left 07/06/2017   Procedure: AMPUTATION BELOW KNEE LEFT;  Surgeon: Rosetta Posner, MD;  Location: [place];  Service: Vascular;  Laterality: Left;  . CATARACT EXTRACTION W/ INTRAOCULAR LENS  IMPLANT, BILATERAL    . DILATION AND CURETTAGE OF UTERUS      Assessment & Plan Clinical Impression: [name] Petersen is a 84 y.o. right-handed female with history of CVA maintained on Plavix, hypertension, diet-controlled diabetes mellitus,CKD stage III.  Per chart review patient lives alone.  She was independent up until January and then began using  a walker.  One level home.  She does not drive.  She has 3 daughters who can assist as needed.  Presented 07/06/2017 with critical limb ischemia left lower extremity with unreconstructable distal tibial disease.  No change with conservative care.  Underwent left BKA 07/06/2017 per Dr. [name].  Hospital course pain management.  Monitoring of creatinine 1.97 from baseline 1.30-1.59 and Cozaar was held.  Subcutaneous heparin for DVT prophylaxis.   Patient transferred to CIR on 07/10/2017 .   Patient currently requires total with mobility secondary to muscle weakness and muscle joint tightness, decreased cardiorespiratoy endurance, decreased visual acuity and decreased sitting balance, decreased standing balance, decreased postural control and decreased balance strategies.  Prior to hospitalization, patient was modified independent  with mobility and lived with   in a Other(Comment)(Condo) home.  Home access is 2Stairs to enter(planning to have ramp built).  Patient will benefit from skilled PT intervention to maximize safe functional mobility, minimize fall risk and decrease caregiver burden for planned discharge home with 24 hour assist.  Anticipate patient will benefit from follow up [facility] at discharge.  PT - End of Session Activity Tolerance: Tolerates < 10 min activity with changes in vital signs Endurance Deficit: Yes Endurance Deficit Description: poor tolerance to sitting up due to pain, N and V PT Assessment Rehab Potential (ACUTE/IP ONLY): Good PT Patient demonstrates impairments in the following area(s): Balance;Endurance;Motor;Pain;Sensory PT Transfers Functional Problem(s): Bed Mobility;Car;Bed to Chair;Furniture PT Locomotion Functional Problem(s): Wheelchair Mobility PT Plan PT Intensity: Minimum of 1-2  x/day ,45 to 90 minutes PT Frequency: 5 out of 7 days PT Duration Estimated Length of Stay: 14-17 PT Treatment/Interventions: Arts development officer;Neuromuscular re-education;Psychosocial support;UE/LE Strength taining/ROM;Wheelchair propulsion/positioning;Balance/vestibular training;Discharge planning;Pain management;Skin care/wound management;Therapeutic Activities;UE/LE Coordination activities;Cognitive remediation/compensation;Functional mobility training;Patient/family education;Splinting/orthotics;Therapeutic Exercise PT Transfers Anticipated Outcome(s): min assist for basic and car PT Locomotion Anticipated Outcome(s): Supervision w/c x 150' PT Recommendation Recommendations for Other Services: Therapeutic Recreation consult;Other (comment)(aromatherapy ) Therapeutic Recreation Interventions: Stress management Follow Up Recommendations: Home health PT Patient destination: Home Equipment Recommended: Wheelchair (measurements);Wheelchair cushion (measurements) Equipment Details: amputee pad and possibly w/c back  Skilled Therapeutic Intervention  Pt limited by phantom pain, fear.  Therapeutic exercise performed with LE to increase strength for functional mobility: active assistive, R/L hip ROM, L quad sets, R straight leg raises, R short arc quad knee ext; 10 x 1 each.  Upon sitting up, pt short of breath, quickly stated that she was going to be sick, and vomited small amount of liquid.  She declined attempting to get OOB.  Pt left resting in bed with alarm set and needs at hand, 2 dtrs in room.  Deidre Ala, RN informed.   PT Evaluation Precautions/Restrictions Precautions Precautions: Fall Restrictions Weight Bearing Restrictions: Yes LLE Weight Bearing: Non weight bearing General   Vital SignsTherapy Vitals BP: (!) 153/89 Pain Pain Assessment Pain Score: 8  Pain Location: Leg Pain Orientation: Left(phantom) Pain Intervention(s): Repositioned(no pain med available yet) Home Living/Prior Functioning Home Living Available Help at Discharge: Family;Available 24 hours/day Type of Home: Other(Comment)(Condo) Home  Access: Stairs to enter(planning to have ramp built) Entrance Stairs-Number of Steps: 2 Home Layout: One level Bathroom Shower/Tub: Multimedia programmer: Standard Prior Function Level of Independence: Independent with gait  Able to Take Stairs?: Yes Driving: No Vocation: Retired Comments: Has been using RW since January; mod indep with this and ADLs Vision/Perception     Cognition Overall Cognitive Status: Within Functional Limits for tasks assessed Orientation Level: Oriented X4 Sensation Sensation Light Touch: Appears Intact Proprioception: Appears Intact Coordination Gross Motor Movements are Fluid and Coordinated: No Fine Motor Movements are Fluid and Coordinated: No Heel Shin Test: NT Motor  Motor Motor: Within Functional Limits Motor - Skilled Clinical Observations: slow movements x 4 extremities  Mobility Bed Mobility Bed Mobility: Rolling Right;Rolling Left;Supine to Sit;Sit to Supine Rolling Right: 2: Max assist Rolling Right Details: Manual facilitation for weight shifting;Manual facilitation for placement;Verbal cues for sequencing;Verbal cues for technique Rolling Left: 2: Max assist Rolling Left Details: Verbal cues for sequencing;Manual facilitation for placement;Verbal cues for technique;Manual facilitation for weight shifting Right Sidelying to Sit: 2: Max assist;With rails Right Sidelying to Sit Details: Manual facilitation for placement;Manual facilitation for weight shifting;Verbal cues for sequencing;Verbal cues for technique Sit to Supine: 3: Mod assist;With rail Sit to Supine - Details: Manual facilitation for placement;Manual facilitation for weight shifting Transfers Transfers: No(pt with N and V upon sitting up EOB) Locomotion  Ambulation Ambulation: No Gait Gait: No Stairs / Additional Locomotion Stairs: No Wheelchair Mobility Wheelchair Mobility: No  Trunk/Postural Assessment  Cervical Assessment Cervical Assessment: Within  Functional Limits Thoracic Assessment Thoracic Assessment: Within Functional Limits Lumbar Assessment Lumbar Assessment: Within Functional Limits Postural Control Postural Control: Deficits on evaluation Righting Reactions: LOB backwards with limited reactions Protective Responses: none to LOB backwards  Balance Balance Balance Assessed: Yes Static Sitting Balance Static Sitting - Level of Assistance: 3: Mod assist Dynamic Sitting Balance Dynamic Sitting - Level of Assistance: Not tested (comment) Static Standing Balance Static Standing -  Level of Assistance: Not tested (comment) Dynamic Standing Balance Dynamic Standing - Level of Assistance: Not tested (comment) Extremity Assessment      RLE Assessment RLE Assessment: Exceptions to [facility] RLE Strength RLE Overall Strength Comments: difficult to assess due to pain: in supine, at least 3-/5 hip flex, knee ext and ankle DF LLE Assessment LLE Assessment: Exceptions to [facility] LLE Strength LLE Overall Strength Comments: difficult to assess due to pain; in supine- at least 2+/5 hip flex, knee flex/ext; at least 2/5 hip abd/adduction   See Function Navigator for Current Functional Status.   Refer to Care Plan for Long Term Goals  Recommendations for other services: Therapeutic Recreation  Stress management and Other: aromatherapy  Discharge Criteria: Patient will be discharged from PT if patient refuses treatment 3 consecutive times without medical reason, if treatment goals not met, if there is a change in medical status, if patient makes no progress towards goals or if patient is discharged from hospital.  The above assessment, treatment plan, treatment alternatives and goals were discussed and mutually agreed upon: by patient and by family  Tona Qualley 07/11/2017, 9:42 AM

## 2017-07-11 NOTE — Progress Notes (Addendum)
  Progress Note    07/11/2017 9:58 AM   Subjective:  Still c/o pain; family feels her pain has improved.   Afebrile x 24hr  Vitals:   07/11/17 0138 07/11/17 0827  BP: (!) 165/93 (!) 153/89  Pulse: 78   Resp: 18   Temp: 98.1 F (36.7 C)   SpO2: 95%     Physical Exam: Incisions:  Left amp site is clean with minimal bloody drainage on mid portion of bandage.  No active ooze.  Right heel with quarter size pressure sore.   CBC    Component Value Date/Time   WBC 9.0 07/09/2017 0329   RBC 4.45 07/09/2017 0329   HGB 13.0 07/09/2017 0329   HCT 39.9 07/09/2017 0329   PLT 192 07/09/2017 0329   MCV 89.7 07/09/2017 0329   MCH 29.2 07/09/2017 0329   MCHC 32.6 07/09/2017 0329   RDW 14.2 07/09/2017 0329   LYMPHSABS 1.9 04/27/2014 1705   MONOABS 0.5 04/27/2014 1705   EOSABS 0.2 04/27/2014 1705   BASOSABS 0.0 04/27/2014 1705    BMET    Component Value Date/Time   NA 133 (L) 07/09/2017 0329   K 3.5 07/09/2017 0329   CL 100 (L) 07/09/2017 0329   CO2 18 (L) 07/09/2017 0329   GLUCOSE 108 (H) 07/09/2017 0329   BUN 19 07/09/2017 0329   CREATININE 1.34 (H) 07/09/2017 0329   CALCIUM 8.0 (L) 07/09/2017 0329   GFRNONAA 34 (L) 07/09/2017 0329   GFRAA 40 (L) 07/09/2017 0329    INR No results found for: INR   Intake/Output Summary (Last 24 hours) at 07/11/2017 0958 Last data filed at 07/11/2017 0700 Gross per 24 hour  Intake 60 ml  Output 200 ml  Net -140 ml     Assessment/Plan:  84 y.o. female is s/p left below knee amputation   -pt's left BKA is healing-continue daily dressing changes.  Knee is straighter than when I saw her Saturday. -new pressure sore on the right heel-will order Pravalon boot to wear while in bed to help with pressure.   Doreatha MassedSamantha Rhyne, PA-C [facility] [phone] 07/11/2017 9:58 AM   I have examined the patient, reviewed and agree with above.  More comfortable.  Amputation healing well.  Concern regarding right heel ulcer.   Discussed importance of keeping this off the bed with patient and family  Gretta Beganodd Emmely Bittinger, MD 07/11/2017 2:36 PM

## 2017-07-11 NOTE — Progress Notes (Signed)
Social Work  Social Work Assessment and Plan  Patient Details  Name: [name] Petersen MRN: [id number] Date of Birth: [birth year]  Today's Date: 3/25 blood this is [name] Petersen okay does give me a couple minutes/2019  Problem List:  Patient Active Problem List   Diagnosis Date Noted  . Atherosclerotic PVD with ulceration (HCC) 07/14/2017  . Pressure injury of right heel, stage 3 (HCC)   . Sleep disturbance   . Poor nutrition   . Hypokalemia   . Drug-induced constipation   . Essential hypertension   . Stage 3 chronic kidney disease (HCC)   . Postoperative pain   . Diabetes mellitus type 2 in obese (HCC)   . S/P below knee amputation, left (HCC) 07/10/2017  . PAD (peripheral artery disease) (HCC) 07/06/2017  . Atherosclerosis of native arteries of the extremities with ulceration (HCC) 06/16/2017   Past Medical History:  Past Medical History:  Diagnosis Date  . Arthritis    hands  . Complication of anesthesia    lost eyesight for a few days after surgery in the 1950's  . Constipation   . Diabetes mellitus without complication (HCC)    no medications   . Hypertension   . Macular degeneration, bilateral    "one eye wet; one eye dry; she gets shots" (07/07/2017)  . Pneumonia   . Stroke [facility])    some trouble swallowing   Past Surgical History:  Past Surgical History:  Procedure Laterality Date  . ABDOMINAL AORTOGRAM W/LOWER EXTREMITY N/A 06/16/2017   Procedure: ABDOMINAL AORTOGRAM W/LOWER EXTREMITY;  Surgeon: Fransisco Hertz, MD;  Location: [facility];  Service: Cardiovascular;  Laterality: N/A;  lt extermity  . ABDOMINAL HYSTERECTOMY    . AMPUTATION Left 07/06/2017   Procedure: AMPUTATION BELOW KNEE LEFT;  Surgeon: Larina Earthly, MD;  Location: [facility];  Service: Vascular;  Laterality: Left;  . CATARACT EXTRACTION W/ INTRAOCULAR LENS  IMPLANT, BILATERAL    . DILATION AND CURETTAGE OF UTERUS     Social History:  reports that she has never smoked. She has never used smokeless  tobacco. She reports that she does not drink alcohol or use drugs.  Family / Support Systems Marital Status: Widow/Widower How Long?: 10 yrs Patient Roles: Parent, Other (Comment) Children: daughter, [name] Petersen), @ [phone];  daughter, [name] Petersen) @ (C) [phone] Other Supports: grandaughter, [name] Petersen) Anticipated Caregiver: Daughters, grand daughter and may hire help as needed Ability/Limitations of Caregiver: Daughters can rotate and assist.  [name] Petersen daughter can assist.  Can hire help since she has a long term care policy. Caregiver Availability: 24/7 Family Dynamics: Daughter and grandaughter at bedside and very encouraging to pt.  Grandaughter states, "if we have to stay 'round the clock for a long time then that's what we'll do."  Social History Preferred language: English Religion: Presbyterian Cultural Background: NA Read: Yes Write: Yes Employment Status: Retired Fish farm manager Issues: None Guardian/Conservator: none - per MD, pt is capable of making decisions on her own behalf.   Abuse/Neglect Abuse/Neglect Assessment Can Be Completed: Yes Physical Abuse: Denies Verbal Abuse: Denies Sexual Abuse: Denies Exploitation of patient/patient's resources: Denies Self-Neglect: Denies  Emotional Status Pt's affect, behavior adn adjustment status: Patient agrees to complete assessment and interview, however, she does report significant nausea and keeps eyes closed most of the time.  Her daughter and granddaughter do assist with information.  Patient reports she is very frustrated with her nausea and how it limits her ability to do  therapy.  She is guardedly hopeful about therapy and being able to regain some level of independence.  She denies any significant emotional distress but will monitor and refer for neuropsychology as in the morning.  Follow Recent Psychosocial Issues: None Pyschiatric History: None Substance Abuse  History: None  Patient / Family Perceptions, Expectations & Goals Pt/Family understanding of illness & functional limitations: Patient and family will have a good, basic understanding of vascular issues that resulted in need for BKA.  All with good awareness of her current functional limitations/need for CIR. Premorbid pt/family roles/activities: Patient was independent PTA. Anticipated changes in roles/activities/participation: Team anticipating supervision to minimal assistance goals.  Family will need to assume caregiver support roles. Pt/family expectations/goals: "I just want to feel better and not so sick."  Patient very focused on how she feels currently but is able to say that she is hopeful she can eventually regain some independence.  Community Resources [employer] Agencies: Other (Comment)(Meals on Wheels) Premorbid Home Care/DME Agencies: None Transportation available at discharge: yes Resource referrals recommended: Support group (specify), Neuropsychology  Discharge Planning Living Arrangements: Alone Support Systems: Children, Other relatives Type of Residence: Private residence Insurance Resources: [employer]are, Media plannerrivate Insurance (specify) Financial Resources: Social Security Financial Screen Referred: No Living Expenses: Own Money Management: Patient Does the patient have any problems obtaining your medications?: No Home Management: pt, private housekeeper q 2 weeks Patient/Family Preliminary Plans: Pt to d/c to her own home with family to stay or arrange some private duty if needed. Social Work Anticipated Follow Up Needs: HH/OP Expected length of stay: 14-18 days  Clinical Impression Very pleasant, elderly woman here following a BKA.  She is currently having severe nausea but is able to complete assessment and interview with family assistance.  Family very supportive and prepared to provide 24/7 assistance, however, patient hopeful she will eventually regain some level of  independence.  Patient and family with good understanding of her current functional limitations.  Patient denies any significant emotional distress, however, will monitor and refer for neuropsych as indicated.  Social work to follow for support and discharge planning needs.  Laquitha Heslin 07/11/2017, 11:02 AM

## 2017-07-11 NOTE — Progress Notes (Signed)
Physical Therapy Session Note  Patient Details  Name: [name] [facility]en MRN: [id number] Date of Birth: 11/02/1926  Today's Date: 07/11/2017 PT Individual Time: 1417-1510 PT Individual Time Calculation (min): 53 min   Short Term Goals: Week 1:  PT Short Term Goal 1 (Week 1): pt will roll L >< R with mod assist PT Short Term Goal 2 (Week 1): pt will move supine> sit with mod assist PT Short Term Goal 3 (Week 1): pt will transfer w/c>< bed wiht assist of 1 person PT Short Term Goal 4 (Week 1): pt will propel w/c x 50' with min assist PT Short Term Goal 5 (Week 1): pt will state 3 components of basic transfer  Skilled Therapeutic Interventions/Progress Updates:  Pt presented in bed agreeable to therapy. Noted pain 0/10 at rest, 8/10 with movement at residual limb. Pt performed supine to sit with maxA x 1 and use of features with PTA providing tactile cues for use of bed rail. Pt able to independently move BLE towards EOB and required assistance for truncal control. Pt initially required modA due to strong posterior lean however over time was able to improve and sit with supervision. Pt indicated increased nausea with slight relief when provided ice water and wet washcloth. Pt set up for SB transfer, per dgt pressure sore on RLE and not allowed to place pressure on heel (no orders for resistricted wt bearing noted at present). Pt attempted SB transfer to L with maxA however pt limited by increased friction between SB and pt due to lack of clothing. Pt returned to bed and SB removed will reattempt transfer tomorrow as pt family bringing clothes tonight. Pt returned to bed mod/maxA and pt able to scoot with increased time to center of bed. Pt able to follow verbal cues for [facility] with increased time. Pt participated in LLE SLR, hip abd/add, SAQ x 10, RLE  SLR, heel slides, hip abd/add x 10. Pt left with pillow under residual limb and RLE to remove pressure from heel and left with bed alarm on,  call button within reach, and family present.      Therapy Documentation Precautions:  Precautions Precautions: Fall Restrictions Weight Bearing Restrictions: Yes LLE Weight Bearing: Non weight bearing General:   Vital Signs: Therapy Vitals Temp: (!) 97.3 F (36.3 C) Temp Source: Oral Pulse Rate: 86 Resp: 18 BP: (!) 153/69 Patient Position (if appropriate): Lying Oxygen Therapy SpO2: 96 % O2 Device: Room Air   See Function Navigator for Current Functional Status.   Therapy/Group: Individual Therapy  Debara Kamphuis  Taniya Dasher, PTA  07/11/2017, 3:57 PM

## 2017-07-11 NOTE — Progress Notes (Signed)
[facility]IL[facility]     PROGRESS NOTE  Subjective/Complaints:  Patient seen lying in bed this morning. Daughter at bedside who provide the majority of the history. She states that patient slept better last night. Patient is extremely hesitant and apprehensive about beginning therapy.  ROS: + left lower extremity pain. Denies CP, SOB, nausea, vomiting, diarrhea.  Objective: Vital Signs: Blood pressure (!) 153/89, pulse 78, temperature 98.1 F (36.7 C), temperature source Oral, resp. rate 18, height 5\' 2"  (1.575 m), weight 76 kg (167 lb 8.8 oz), SpO2 95 %. No results found. Recent Labs    07/09/17 0329  WBC 9.0  HGB 13.0  HCT 39.9  PLT 192   Recent Labs    07/09/17 0329  NA 133*  K 3.5  CL 100*  GLUCOSE 108*  BUN 19  CREATININE 1.34*  CALCIUM 8.0*   CBG (last 3)  No results for input(s): GLUCAP in the last 72 hours.  Wt Readings from Last 3 Encounters:  07/11/17 76 kg (167 lb 8.8 oz)  07/06/17 76.2 kg (168 lb)  06/16/17 76.2 kg (168 lb)    Physical Exam:  BP (!) 153/89   Pulse 78   Temp 98.1 F (36.7 C) (Oral)   Resp 18   Ht 5\' 2"  (1.575 m)   Wt 76 kg (167 lb 8.8 oz)   SpO2 95%   BMI 30.65 kg/m  Gen.: well-developed. Well-nourished. + Distressed HENT: Normocephalic. Atraumatic. Eyes: EOMI. No discharge.  Cardiovascular: Normal rate and regular rhythm. No JVD. Respiratory: Clear. Unlabored.  GI: Bowel sounds are normal. She exhibits no distension.  Musculoskeletal: Left stump edema and extremely tender to palpation with minimal touch, even well proximal to incision site Neurological.   Alert and oriented 2 Follows simple commands Motor: Bilateral upper extremities: 4+/5 proximal to distal Right lower extremity: Hip flexion, knee extension 2/5, ankle dorsiflexion 4 -/5 (pain inhibition) Left lower extremity: Hip flexion 1/5 (pain inhibition) Skin.  incision with staples C/D/I  Assessment/Plan: 1. Functional deficits secondary  to left BKA which require 3+ hours per day of interdisciplinary therapy in a comprehensive inpatient rehab setting. Physiatrist is providing close team supervision and 24 hour management of active medical problems listed below. Physiatrist and rehab team continue to assess barriers to discharge/monitor patient progress toward functional and medical goals.  Function:  Bathing Bathing position      Bathing parts      Bathing assist        Upper Body Dressing/Undressing Upper body dressing                    Upper body assist        Lower Body Dressing/Undressing Lower body dressing                                  Lower body assist        Toileting Toileting          Toileting assist     Transfers Chair/bed transfer Chair/bed transfer activity did not occur: Safety/medical concerns(pain, N and V)           Locomotion Ambulation Ambulation activity did not occur: Safety/medical concerns(pain, N and V)         Wheelchair Wheelchair activity did not occur: Safety/medical concerns(pain, N and V)        Cognition Comprehension Comprehension assist level: Follows complex conversation/direction with  extra time/assistive device  Expression Expression assist level: Expresses basic 50 - 74% of the time/requires cueing 25 - 49% of the time. Needs to repeat parts of sentences.  Social Interaction Social Interaction assist level: Interacts appropriately 75 - 89% of the time - Needs redirection for appropriate language or to initiate interaction.  Problem Solving Problem solving assist level: Solves basic 25 - 49% of the time - needs direction more than half the time to initiate, plan or complete simple activities  Memory Memory assist level: Recognizes or recalls 50 - 74% of the time/requires cueing 25 - 49% of the time    Medical Problem List and Plan: 1.  Decreased functional mobility secondary to left BKA secondary to peripheral vascular disease  07/06/2017   Continue CIR   Notes reviewed, labs reviewed 2.  DVT Prophylaxis/Anticoagulation: Subcutaneous heparin. Monitor for any bleeding episodes 3. Pain Management: Oxycodone/Ultram as needed   Patient will need a significant amount of encouragement related to extremely poor coping mechanisms   Extreme amount of pain at present with minimal active or passive movement, stating she will not be able to tolerate therapy 4. Mood: Provide emotional support 5. Neuropsych: This patient is ?fully capable of making decisions on her own behalf. 6. Skin/Wound Care: Routine skin checks 7. Fluids/Electrolytes/Nutrition: Routine I/O's  8.  CKD stage III.  Creatinine baseline 1.30-1.59.     Cozaar was held due to increasing creatinine   Creatinine 1.34 on 3/23   Continue to monitor 9.  Hypertension.  Norvasc 10 mg daily, HCTZ 12.5 mg twice daily.   Monitor with increased mobility   Elevated, will consider restarting Cozaar 50 after labs 10.  History of CVA.  Continue Plavix. 11.  Diet controlled diabetes mellitus.  Hemoglobin A1c 6.0.     CBGs discontinued   Monitor with increased mobility 12. Obesity   Body mass index is 30.65 kg/m.   Diet and exercise education   Encourage weight loss to increase endurance and promote overall health   LOS (Days) 1 A FACE TO FACE EVALUATION WAS PERFORMED  Dorene Bruni Karis Juba 07/11/2017 9:36 AM

## 2017-07-11 NOTE — Progress Notes (Signed)
[name], RN  Rehab Admission Coordinator  Physical Medicine and Rehabilitation  PMR Pre-admission  Signed  Date of Service:  07/08/2017 11:15 AM       Related encounter: Admission (Discharged) from 07/06/2017 in [facility] 4E CV SURGICAL PROGRESSIVE CARE      Signed            [] Hide copied text  [] Hover for details   PMR Admission Coordinator Pre-Admission Assessment  Patient: [name] Petersen is an 84 y.o., female MRN: [id number] DOB: July 16, 1926 Height: 5'4" Weight: 76.2 kg (168 lb)                                                                                                                                             Insurance Information HMO: NOo   PPO:       PCP:       IPA:       80/20:       OTHER:   PRIMARY:  Medicare A and B      Policy#: [id number]      Subscriber: patient CM Name:        Phone#:       Fax#:   Pre-Cert#:        Employer: Retired Benefits:  Phone #:       Name: Checked in Passport One source Eff. Date: 01/17/94     Deduct:  $1364      Out of Pocket Max: none      Life Max: N/A CIR: 100%      SNF: 100 days Outpatient: 80%     Co-Pay: 20% Home Health: 100%      Co-Pay: none DME: 80%     Co-Pay: 20% Providers: patient's choice  SECONDARY:  [employer]      Policy#: [id number]      Subscriber:  patient CM Name:        Phone#:       Fax#:   Pre-Cert#:        Employer: Retired Benefits:  Phone #: [phone]     Name:   Eff. Date:       Deduct:        Out of Pocket Max:        Life Max:   CIR:        SNF:   Outpatient:       Co-Pay:   Home Health:        Co-Pay:   DME:       Co-Pay:    Emergency Contact Information         Contact Information    Name Relation Home Work [place] Daughter [phone]  [phone]   [facility] Daughter [phone]  [phone]   [name]   [phone]     Current Medical History  Patient Admitting Diagnosis:  L BKA  History of Present Illness:  An 84 y.o.right-handed femalewith history of CVA maintained on Plavix, hypertension,diet-controlleddiabetes mellitus,CKDstage III.Per chart review patient lives alone. She was independent up until January and then began using a walker. One level home. She does not drive. She has 3 daughters who can assist as needed.Presented 07/06/2017 with critical limb ischemia left lower extremity with unreconstructable distal tibial disease. No change with conservative care. Underwent left BKA 07/06/2017 per Dr. Arbie Cookey. Hospital course pain management. Monitoring of creatinine 1.47from baseline 1.30-1.59. Subcutaneous Lovenox for DVT prophylaxis. Physical and occupational therapy evaluationscompleted with recommendations of physical medicine rehab consult. Patient to be admitted for a comprehensive inpatient rehab program.  Past Medical History      Past Medical History:  Diagnosis Date  . Arthritis    hands  . Complication of anesthesia    lost eyesight for a few days after surgery in the 1950's  . Constipation   . Diabetes mellitus without complication (HCC)    no medications   . Hypertension   . Macular degeneration, bilateral    "one eye wet; one eye dry; she gets shots" (07/07/2017)  . Pneumonia   . Stroke [facility])    some trouble swallowing    Family History  family history is not on file. She was adopted.  Prior Rehab/Hospitalizations: Had outpatient therapy for right hip pain 01/19  Has the patient had major surgery during 100 days prior to admission? No  Current Medications   Current Facility-Administered Medications:  .  0.9 %  sodium chloride infusion, , Intravenous, Continuous, Lars Mage, [place], Last Rate: 125 mL/hr at 07/08/17 0433 .  acetaminophen (TYLENOL) tablet 325-650 mg, 325-650 mg, Oral, Q6H PRN, Thomasena Edis, Emma M, PA-C .  alum & mag hydroxide-simeth (MAALOX/MYLANTA) 200-200-20 MG/5ML suspension 15-30 mL, 15-30 mL, Oral, Q2H PRN, Thomasena Edis,  Emma M, PA-C .  amLODipine (NORVASC) tablet 10 mg, 10 mg, Oral, Daily, Clinton Gallant M, PA-C, 10 mg at 07/08/17 1104 .  bisacodyl (DULCOLAX) EC tablet 5 mg, 5 mg, Oral, Daily PRN, Thomasena Edis, Emma M, PA-C .  clopidogrel (PLAVIX) tablet 75 mg, 75 mg, Oral, Daily, Clinton Gallant M, PA-C, 75 mg at 07/08/17 1104 .  diphenhydrAMINE (BENADRYL) injection 12.5 mg, 12.5 mg, Intravenous, Q6H PRN **OR** diphenhydrAMINE (BENADRYL) 12.5 MG/5ML elixir 12.5 mg, 12.5 mg, Oral, Q6H PRN, Fields, Charles E, MD .  docusate sodium (COLACE) capsule 100 mg, 100 mg, Oral, Daily, Clinton Gallant M, PA-C, 100 mg at 07/08/17 1103 .  guaiFENesin-dextromethorphan (ROBITUSSIN DM) 100-10 MG/5ML syrup 15 mL, 15 mL, Oral, Q4H PRN, Thomasena Edis, Emma M, PA-C .  heparin injection 5,000 Units, 5,000 Units, Subcutaneous, Q8H, Rhyne, Samantha J, PA-C, 5,000 Units at 07/08/17 1354 .  hydrALAZINE (APRESOLINE) injection 5 mg, 5 mg, Intravenous, Q20 Min PRN, Collins, Emma M, PA-C .  hydrochlorothiazide (MICROZIDE) capsule 12.5 mg, 12.5 mg, Oral, BID, Early, Kristen Loader, MD, 12.5 mg at 07/08/17 1104 .  labetalol (NORMODYNE,TRANDATE) injection 10 mg, 10 mg, Intravenous, Q10 min PRN, Clinton Gallant M, PA-C, 10 mg at 07/06/17 2135 .  magnesium sulfate IVPB 2 g 50 mL, 2 g, Intravenous, Daily PRN, Thomasena Edis, Emma M, PA-C .  metoprolol tartrate (LOPRESSOR) injection 2-5 mg, 2-5 mg, Intravenous, Q2H PRN, Clinton Gallant M, PA-C .  morphine 2 MG/ML injection 0.5-1 mg, 0.5-1 mg, Intravenous, Q1H PRN, Clinton Gallant M, PA-C, 1 mg at 07/07/17 1340 .  morphine 2 mg/mL PCA injection, , Intravenous, Q4H, Fields, Charles E, MD .  multivitamin (PROSIGHT) tablet 1 tablet, 1 tablet,  Oral, Daily, Clinton GallantCollins, Emma M, [place]PA-C, 1 tablet at 07/08/17 1103 .  naloxone [facility](NARCAN) injection 0.4 mg, 0.4 mg, Intravenous, PRN **AND** sodium chloride flush (NS) 0.9 % injection 9 mL, 9 mL, Intravenous, PRN, Fields, Charles E, MD .  ondansetron [facility](ZOFRAN) injection 4 mg, 4 mg, Intravenous, Q6H PRN,  Fields, Janetta Horaharles E, MD .  oxyCODONE-acetaminophen (PERCOCET/ROXICET) 5-325 MG per tablet 1-2 tablet, 1-2 tablet, Oral, Q4H PRN, Lars Mageollins, Emma M, PA-C, 1 tablet at 07/08/17 1103 .  pantoprazole (PROTONIX) EC tablet 40 mg, 40 mg, Oral, Daily, Clinton GallantCollins, Emma M, PA-C, 40 mg at 07/08/17 1103 .  phenol (CHLORASEPTIC) mouth spray 1 spray, 1 spray, Mouth/Throat, PRN, Thomasena Edisollins, Emma M, PA-C .  potassium chloride SA (K-DUR,KLOR-CON) CR tablet 20-40 mEq, 20-40 mEq, Oral, Daily PRN, Thomasena Edisollins, Emma M, PA-C .  senna-docusate (Senokot-S) tablet 1 tablet, 1 tablet, Oral, QHS PRN, Clinton Gallantollins, Emma M, PA-C .  traMADol Janean Sark(ULTRAM) tablet 50-100 mg, 50-100 mg, Oral, Q4H PRN, Clinton GallantCollins, Emma M, PA-C, 100 mg at 07/06/17 [id number]  Patients Current Diet: Diet Heart Room service appropriate? Yes; Fluid consistency: Thin  Precautions / Restrictions Precautions Precautions: Fall Restrictions Weight Bearing Restrictions: No   Has the patient had 2 or more falls or a fall with injury in the past year?No.  Family reports 1 fall last week.  Prior Activity Level Limited Community (1-2x/wk): Went out about 1 X a week  Journalist, newspaperHome Assistive Devices / Equipment Home Assistive Devices/Equipment: Environmental consultantWalker (specify type), Eyeglasses, Grab bars in shower, Grab bars around toilet, Built-in shower seat, Shower chair with back Home Equipment: Grab bars - toilet, Grab bars - tub/shower, Shower seat, Environmental consultantWalker - 2 wheels, Cane - single point  Prior Device Use: Indicate devices/aids used by the patient prior to current illness, exacerbation or injury? Walker  Prior Functional Level Prior Function Level of Independence: Independent with assistive device(s) Comments: Has been using RW since January; mod indep with this and ADLs  Self Care: Did the patient need help bathing, dressing, using the toilet or eating?  Independent  Indoor Mobility: Did the patient need assistance with walking from room to room (with or without device)?  Independent  Stairs: Did the patient need assistance with internal or external stairs (with or without device)? Independent  Functional Cognition: Did the patient need help planning regular tasks such as shopping or remembering to take medications? Independent  Current Functional Level Cognition  Overall Cognitive Status: Impaired/Different from baseline Current Attention Level: Focused Orientation Level: Oriented to person, Oriented to place, Disoriented to time, Disoriented to situation Following Commands: Follows one step commands with increased time General Comments: Pt very lethargic with eyes closed majority of session, requiring max multimodal cues to participate, suspect due to pain medications (RN aware)    Extremity Assessment (includes Sensation/Coordination)  Upper Extremity Assessment: Overall WFL for tasks assessed  Lower Extremity Assessment: LLE deficits/detail LLE Deficits / Details: s/p L BKA; hip flex/ADD/ABD grossly 3/5 LLE: Unable to fully assess due to pain    ADLs  Overall ADL's : Needs assistance/impaired Eating/Feeding: Sitting, Maximal assistance Grooming: Brushing hair, Sitting, Total assistance Upper Body Bathing: Total assistance, Bed level Lower Body Bathing: Bed level, Total assistance Upper Body Dressing : Bed level, Maximal assistance Lower Body Dressing: Total assistance, Bed level Toileting- Clothing Manipulation and Hygiene: Total assistance, Bed level General ADL Comments: Requiring total assist, unable to participate due to lethargy.    Mobility  Overal bed mobility: Needs Assistance Bed Mobility: Rolling, Sidelying to Sit Rolling: Max assist Sidelying to  sit: Max assist, +2 for physical assistance, HOB elevated Supine to sit: Max assist, +2 for physical assistance Sit to supine: Max assist, +2 for physical assistance General bed mobility comments: MaxA+2 for bed mobility to sit EOB; pt very lethargic this session and not providing  as much assist. Use of bed pad to help reposition hips at EOB    Transfers  Overall transfer level: Needs assistance Equipment used: 2 person hand held assist Transfers: Lateral/Scoot Transfers Sit to Stand: Max assist, +2 physical assistance  Lateral/Scoot Transfers: +2 safety/equipment, Total assist General transfer comment: no attempt to stand, did not participate in transfer to chair    Ambulation / Gait / Stairs / Engineer, drilling / Balance Dynamic Sitting Balance Sitting balance - Comments: Reliant on BUEs to maintain seated balance and min-maxA (suspect due to lethargy) Balance Overall balance assessment: Needs assistance Sitting-balance support: Bilateral upper extremity supported Sitting balance-Leahy Scale: Poor Sitting balance - Comments: Reliant on BUEs to maintain seated balance and min-maxA (suspect due to lethargy) Postural control: Posterior lean    Special needs/care consideration BiPAP/CPAP No CPM No Continuous Drip IV KVO Dialysis No       Life Vest No Oxygen No Special Bed No Trach Size No Wound Vac (area) No    Skin New left BKA surgical site with dressing.                             Bowel mgmt: Last BM 07/05/17 Bladder mgmt: External catheter Diabetic mgmt Borderline diabetic    Previous Home Environment Living Arrangements: Alone Available Help at Discharge: Family, Available 24 hours/day Type of Home: Other(Comment)(Condo) Home Layout: One level Home Access: Stairs to enter(planning to have ramp built) Secretary/administrator of Steps: 2 Bathroom Shower/Tub: Health visitor: Standard Home Care Services: Yes Type of Home Care Services: Meals on wheels, Housekeeping Home Care Agency (if known): "private pay housekeeping"  Discharge Living Setting Plans for Discharge Living Setting: Alone, Other (Comment)(Lives alone in a townhouse.) Type of Home at Discharge: Other (Comment)(Townhouse) Discharge Home  Layout: One level Discharge Home Access: Stairs to enter(Family will build a ramp) Entrance Stairs-Number of Steps: 2 Does the patient have any problems obtaining your medications?: No  Social/Family/Support Systems Patient Roles: Parent, Other (Comment)(Has 3 daughters and a grand daughter.) Contact Information: Vivien Presto - daughter - [phone] Anticipated Caregiver: Daughters, grand daughter and may hire help as needed Ability/Limitations of Caregiver: Daughters can rotate and assist.  Kris Mouton daughter can assist.  Can hire help since she has a long term care policy. Caregiver Availability: 24/7 Discharge Plan Discussed with Primary Caregiver: Yes Is Caregiver In Agreement with Plan?: Yes Does Caregiver/Family have Issues with Lodging/Transportation while Pt is in Rehab?: No  Goals/Additional Needs Patient/Family Goal for Rehab: PT mod I and supervision, OT mod I to supervision to min assist goals Expected length of stay: 10-18 days Cultural Considerations: [facility] Dietary Needs: Heart diet, thin liquids Equipment Needs: TBD Pt/Family Agrees to Admission and willing to participate: Yes Program Orientation Provided & Reviewed with Pt/Caregiver Including Roles  & Responsibilities: Yes  Decrease burden of Care through IP rehab admission: N/A  Possible need for SNF placement upon discharge: Not anticipated  Patient Condition: This patient's medical and functional status has changed since the consult dated: 07/07/17 in which the Rehabilitation Physician determined and documented that the patient's condition is appropriate for intensive rehabilitative care in an  inpatient rehabilitation facility. See "History of Present Illness" (above) for medical update. Functional changes are:  Currently requiring max assist +2 for transfers. Patient's medical and functional status update has been discussed with the Rehabilitation physician and patient remains appropriate for inpatient  rehabilitation. Will admit to inpatient rehab on 07/10/17.  Preadmission Screen Completed By:  [name], 07/08/2017 2:02 PM ______________________________________________________________________   Discussed status with Dr. Riley Kill on 07/08/17 at 1400 and received telephone approval for admission on 07/10/17.  Admission Coordinator:  [name], time 1402/Date 07/08/17             Cosigned by: Ranelle Oyster, MD at 07/09/2017 6:44 PM  Revision History

## 2017-07-11 NOTE — Progress Notes (Signed)
Patient resting currently noted up most of shift,vebaling excruciating pain to surgical area of left BKA,medicated per orders with some attempts made to reposition .Patient unable to tolerate movement states secondary to increase pain and discomfort. Daughter at bedside support provided.Continue to encourage po liquids, daughter states she has had a very 'poor appetite' since surgery. Monitor closely refer to assessment data records. Dressing intact to surgical area and call bed within reach, bed in low positioning SR x3 .

## 2017-07-11 NOTE — Progress Notes (Signed)
Patient information reviewed and entered into eRehab system by Layal Javid, RN, CRRN, PPS Coordinator.  Information including medical coding and functional independence measure will be reviewed and updated through discharge.     Per nursing patient was given "Data Collection Information Summary for Patients in Inpatient Rehabilitation Facilities with attached "Privacy Act Statement-Health Care Records" upon admission.  

## 2017-07-12 ENCOUNTER — Encounter: Payer: Medicare Other | Admitting: Vascular Surgery

## 2017-07-12 ENCOUNTER — Inpatient Hospital Stay (HOSPITAL_COMMUNITY): Payer: Medicare Other | Admitting: Occupational Therapy

## 2017-07-12 ENCOUNTER — Inpatient Hospital Stay (HOSPITAL_COMMUNITY): Payer: Medicare Other

## 2017-07-12 ENCOUNTER — Inpatient Hospital Stay (HOSPITAL_COMMUNITY): Payer: Medicare Other | Admitting: Physical Therapy

## 2017-07-12 DIAGNOSIS — K5903 Drug induced constipation: Secondary | ICD-10-CM

## 2017-07-12 DIAGNOSIS — E876 Hypokalemia: Secondary | ICD-10-CM

## 2017-07-12 MED ORDER — POTASSIUM CHLORIDE CRYS ER 20 MEQ PO TBCR
40.0000 meq | EXTENDED_RELEASE_TABLET | Freq: Two times a day (BID) | ORAL | Status: AC
  Administered 2017-07-12 (×2): 40 meq via ORAL
  Filled 2017-07-12 (×2): qty 2

## 2017-07-12 MED ORDER — LOSARTAN POTASSIUM 50 MG PO TABS
50.0000 mg | ORAL_TABLET | Freq: Every day | ORAL | Status: DC
  Administered 2017-07-12 – 2017-07-13 (×2): 50 mg via ORAL
  Filled 2017-07-12 (×2): qty 1

## 2017-07-12 MED ORDER — SENNOSIDES-DOCUSATE SODIUM 8.6-50 MG PO TABS
2.0000 | ORAL_TABLET | Freq: Two times a day (BID) | ORAL | Status: DC
  Administered 2017-07-12 – 2017-07-13 (×4): 2 via ORAL
  Filled 2017-07-12 (×4): qty 2

## 2017-07-12 NOTE — Progress Notes (Signed)
[facility] PHYSICAL MEDICINE & REHABILITATION     PROGRESS NOTE  Subjective/Complaints:  Pt seen lying in bed this AM.  Daughter states she did not sleep well overnight due to nausea, no nausea this AM.  She also notes constipation.  She does note improvement in pain. Daughter reports that other daughter stated patient had a good first day of therapies.   ROS: Denies CP, SOB, nausea, vomiting, diarrhea.  Objective: Vital Signs: Blood pressure (!) 168/72, pulse 83, temperature 98.6 F (37 C), temperature source Oral, resp. rate 20, height 5\' 2"  (1.575 m), weight 76 kg (167 lb 8.8 oz), SpO2 95 %. No results found. Recent Labs    07/11/17 0928  WBC 8.2  HGB 13.3  HCT 38.6  PLT 223   Recent Labs    07/11/17 0928  NA 137  K 3.3*  CL 101  GLUCOSE 138*  BUN 28*  CREATININE 1.24*  CALCIUM 9.0   CBG (last 3)  Recent Labs    07/11/17 2124  GLUCAP 116*    Wt Readings from Last 3 Encounters:  07/11/17 76 kg (167 lb 8.8 oz)  07/06/17 76.2 kg (168 lb)  06/16/17 76.2 kg (168 lb)    Physical Exam:  BP (!) 168/72 (BP Location: Left Arm)   Pulse 83   Temp 98.6 F (37 C) (Oral)   Resp 20   Ht 5\' 2"  (1.575 m)   Wt 76 kg (167 lb 8.8 oz)   SpO2 95%   BMI 30.65 kg/m  Gen.: well-developed. Well-nourished. + Distressed HENT: Normocephalic. Atraumatic. Eyes: EOMI. No discharge.  Cardiovascular: RRR. No JVD. Respiratory: Clear. Unlabored.  GI: Bowel sounds are normal. She exhibits no distension.  Musculoskeletal: Left stump edema and extremely tender to palpation with minimal touch, even well proximal to incision site Neurological.   Alert and oriented Follows simple commands Motor: Bilateral upper extremities: 4+/5 proximal to distal Right lower extremity: Hip flexion, knee extension 4-/5, ankle dorsiflexion 4/5 (pain inhibition) Left lower extremity: Hip flexion 3+-4-/5 (some pain inhibition) Skin: Incision with staples C/D/I  Assessment/Plan: 1. Functional deficits  secondary to left BKA which require 3+ hours per day of interdisciplinary therapy in a comprehensive inpatient rehab setting. Physiatrist is providing close team supervision and 24 hour management of active medical problems listed below. Physiatrist and rehab team continue to assess barriers to discharge/monitor patient progress toward functional and medical goals.  Function:  Bathing Bathing position   Position: Bed  Bathing parts   Body parts bathed by helper: Right arm, Left arm, Chest, Abdomen, Front perineal area, Buttocks, Right upper leg, Left upper leg, Right lower leg, Back  Bathing assist Assist Level: 2 helpers      Upper Body Dressing/Undressing Upper body dressing   What is the patient wearing?: Hospital gown                Upper body assist        Lower Body Dressing/Undressing Lower body dressing                                  Lower body assist        Toileting Toileting          Toileting assist     Transfers Chair/bed transfer Chair/bed transfer activity did not occur: Safety/medical concerns(pain, N and V)           Locomotion Ambulation Ambulation activity did not  occur: Safety/medical concerns(pain, N and V)         Wheelchair Wheelchair activity did not occur: Safety/medical concerns(pain, N and V)        Cognition Comprehension Comprehension assist level: Follows complex conversation/direction with extra time/assistive device  Expression Expression assist level: Expresses basic 50 - 74% of the time/requires cueing 25 - 49% of the time. Needs to repeat parts of sentences.  Social Interaction Social Interaction assist level: Interacts appropriately 75 - 89% of the time - Needs redirection for appropriate language or to initiate interaction.  Problem Solving Problem solving assist level: Solves basic 25 - 49% of the time - needs direction more than half the time to initiate, plan or complete simple activities  Memory  Memory assist level: Recognizes or recalls 50 - 74% of the time/requires cueing 25 - 49% of the time    Medical Problem List and Plan: 1.  Decreased functional mobility secondary to left BKA secondary to peripheral vascular disease 07/06/2017   Continue CIR 2.  DVT Prophylaxis/Anticoagulation: Subcutaneous heparin. Monitor for any bleeding episodes 3. Pain Management: Oxycodone/Ultram as needed   Patient will need a significant amount of encouragement related to poor coping mechanisms 4. Mood: Provide emotional support 5. Neuropsych: This patient is ?fully capable of making decisions on her own behalf. 6. Skin/Wound Care: Routine skin checks 7. Fluids/Electrolytes/Nutrition: Routine I/O's  8.  CKD stage III.  Creatinine baseline 1.30-1.59.     Creatinine 1.24 on 3/25   Encourage fluids   Continue to monitor 9.  Hypertension.  Norvasc 10 mg daily, HCTZ 12.5 mg twice daily.   Monitor with increased mobility   Cozaar 50 started on 3/26 10.  History of CVA.  Continue Plavix. 11.  Diet controlled diabetes mellitus.  Hemoglobin A1c 6.0.     CBGs discontinued   Monitor with increased mobility 12. Obesity   Body mass index is 30.65 kg/m.   Diet and exercise education   Encourage weight loss to increase endurance and promote overall health 13. Hypokalemia   K+ 3.3 on 3/25   Supplemented x2 days 14. Drug induced constipation   KUB ordered   Bowel reg increased on 3/26  LOS (Days) 2 A FACE TO FACE EVALUATION WAS PERFORMED  Peter Keyworth Karis Juba 07/12/2017 8:28 AM

## 2017-07-12 NOTE — Progress Notes (Signed)
Physical Therapy Session Note  Patient Details  Name: [name] Petersen MRN: [id number] Date of Birth: 07/05/1926  Today's Date: 07/12/2017 PT Individual Time: [phone] and 1420-1530 PT Individual Time Calculation (min): 55 min and 70 min Short Term Goals: Week 1:  PT Short Term Goal 1 (Week 1): pt will roll L >< R with mod assist PT Short Term Goal 2 (Week 1): pt will move supine> sit with mod assist PT Short Term Goal 3 (Week 1): pt will transfer w/c>< bed wiht assist of 1 person PT Short Term Goal 4 (Week 1): pt will propel w/c x 50' with min assist PT Short Term Goal 5 (Week 1): pt will state 3 components of basic transfer  Skilled Therapeutic Interventions/Progress Updates: Tx1: Pt presented in bed agreeable to therapy. Session focused on initiating OOB activity. Pt noted to have soiled brief. Pt participate din rolling L/R for changing brief and performing peri care. Performed supine to sit with modA, pt able to follow commands for sequencing however requiring assist for truncal support, modA for scooting for EOB with tactile cues for facilitating lateral wt shift. Pt initially with strong posterior lean in sitting which improved with multimodal cues. Instructed and performed SB transfer to L to w/c. Pt required max cues for leaning forward and maintaining head/hips relationship. Pt noted to continue to push upwards with posterior lean requiring maxA from PTA to complete transfer. Pt required modA for scooting back in w/c. Initiated w/c mobility with pt propelling [id number] with min cues to maintain straight trajectory with fair carryover. Pt returned to room and remained in w/c with pillow placed under residual limb extender and pillow under R foot plate for comfort. Pt left with call bell within reach and family present.  Tx2: Pt presented in w/c agreeable for therapy. Pt transported to rehab gym for energy conservation. Pt instructed in participated in SB transfer to mat, requiring maxA with max  cues for increasing anterior wt and increasing head hips relationship. Pt participated in seated balance activities including reaching for horse shoes with PTA encouraging pt to increase anterior lean to reach objects. Pt at times becoming very fearful of leaning forward. Performed sit to supine on mat and performed LLE SLE, SAQ, hip abd/add with at time requiring tactile cues for completion of task due to pt falling asleep during activity. Pt returned to sitting with modA and performed sit to stand x 2 wth mod/maxA from elevated mat. Pt able to maintain standing for approx 5 sec on second trial. Pt performed SB transfer to L with improved technique and maxA. Pt c/o increased pain once residual limb placed on extender, nsg notified. Pt transported back to room and performed SB transfer to bed requiring modA x 2 due to fatigue. Pt repositioned in bed to comfort and prevalon boot placed on RLE with pillow placed on L residual limb. Pt left in bed with bed alarm set and dgt present.      Therapy Documentation Precautions:  Precautions Precautions: Fall Restrictions Weight Bearing Restrictions: Yes LLE Weight Bearing: Non weight bearing General:   Vital Signs:  Pain: Pain Assessment Pain Scale: 0-10 Pain Score: 0-No pain   See Function Navigator for Current Functional Status.   Therapy/Group: Individual Therapy  Kumiko Fishman  Cuauhtemoc Huegel, PTA  07/12/2017, 10:17 AM

## 2017-07-12 NOTE — Progress Notes (Signed)
Patient complaining of nausea medicated with Zophan po, abdominal area bloated, repositioned to left side and passing flatus,, made as comfortable as possible Call bell and bed alarm in place.

## 2017-07-12 NOTE — Progress Notes (Signed)
Occupational Therapy Session Note  Patient Details  Name: [name] Petersen MRN: [id number] Date of Birth: 08/24/1926  Today's Date: 07/12/2017 OT Individual Time: 1300-1400 OT Individual Time Calculation (min): 60 min    Short Term Goals: Week 1:  OT Short Term Goal 1 (Week 1): Pt will complete bathing with mod assist OT Short Term Goal 2 (Week 1): Pt will complete LB dressing with max assist of 1 caregiver OT Short Term Goal 3 (Week 1): Pt will complete toilet transfer with max assist of 1 caregiver OT Short Term Goal 4 (Week 1): Pt will remain OOB for 1 hour to increase activity tolerance/endurance  Skilled Therapeutic Interventions/Progress Updates:    Treatment session with focus on transfers, lateral leans, and partial sit > stand for toileting.  Pt received upright on drop arm commode attempting BM.  Engaged in lateral leans for hygiene with pt requiring increased time and max cues for sequencing and weight shift.  +2 for sit > partial stand for clothing management.  Multiple sit > partial stands for clothing management, pt with improved weight shift with each attempt.  Utilized slide board for lateral scoot transfer back to w/c with +2 for safety and max assist from therapist for weight shift and hand placement.  Educated on proper placement of residual limb and focus on knee extension to keep mobility to allow for future prosthesis.  Pt required max encouragement and tactile cues to perform knee extension.  Pt falling asleep towards end of session, requiring frequent cues to maintain arousal and engagement.  Pt left upright in w/c with family member present and all needs in reach.  Therapy Documentation Precautions:  Precautions Precautions: Fall Restrictions Weight Bearing Restrictions: Yes LLE Weight Bearing: Non weight bearing General:   Vital Signs: Therapy Vitals Temp: 97.8 F (36.6 C) Temp Source: Oral Pulse Rate: 87 Resp: 18 BP: (!) 164/81 Patient Position (if  appropriate): Sitting Oxygen Therapy SpO2: 99 % O2 Device: Room Air Pain:  Pt with c/o pain in residual limb, not rated.  RN notified  See Function Navigator for Current Functional Status.   Therapy/Group: Individual Therapy  Rosalio LoudHOXIE, Olesya Wike 07/12/2017, 2:44 PM

## 2017-07-12 NOTE — H&P (Signed)
Post Admission Physician Evaluation:  1. Preadmission assessment reviewed and changes made below. 2. Functional deficits secondary to left BKA. 3. Patient is admitted to receive collaborative, interdisciplinary care between the physiatrist, rehab nursing staff, and therapy team. 4. Patient's level of medical complexity and substantial therapy needs in context of that medical necessity cannot be provided at a lesser intensity of care such as a SNF. 5. Patient has experienced substantial functional loss from his/her baseline which was documented above under the "Functional History" and "Functional Status" headings. Judging by the patient's diagnosis, physical exam, and functional history, the patient has potential for functional progress which will result in measurable gains while on inpatient rehab. These gains will be of substantial and practical use upon discharge in facilitating mobility and self-care at the household level. 6. Physiatrist will provide 24 hour management of medical needs as well as oversight of the therapy plan/treatment and provide guidance as appropriate regarding the interaction of the two. 7. 24 hour rehab nursing will assist with bladder management, bowel management, safety, skin/wound care, disease management, medication administration, pain management and patient education and help integrate therapy concepts, techniques,education, etc. 8. PT will assess and treat for/with: Lower extremity strength, range of motion, stamina, balance, functional mobility, safety, adaptive techniques and equipment, pre-prosthetic education. Goals are: Min A. 9. OT will assess and treat for/with: ADL's, functional mobility, safety, upper extremity strength, adaptive techniques and equipment, pain mgt. Goals are: Min A. Therapy may proceed with showering this patient. 10. Case Management and Social Worker will assess and treat for psychological issues and discharge planning. 11. Team conference will  be held weekly to assess progress toward goals and to determine barriers to discharge. 12. Patient will receive at least 3 hours of therapy per day at least 5 days per week. 13. ELOS: 10-14 days  14. Prognosis: Good  Maryla MorrowAnkit Filomena Pokorney, MD, ABPMR

## 2017-07-12 NOTE — Progress Notes (Signed)
  Progress Note    07/12/2017 9:15 AM   Subjective:  Still nauseous   Afebrile  Vitals:   07/11/17 2100 07/12/17 0430  BP: (!) 157/79 (!) 168/72  Pulse: 78 83  Resp:  20  Temp:  98.6 F (37 C)  SpO2: 95% 95%    Physical Exam: General:  No distress; just back from xray   CBC    Component Value Date/Time   WBC 8.2 07/11/2017 0928   RBC 4.45 07/11/2017 0928   HGB 13.3 07/11/2017 0928   HCT 38.6 07/11/2017 0928   PLT 223 07/11/2017 0928   MCV 86.7 07/11/2017 0928   MCH 29.9 07/11/2017 0928   MCHC 34.5 07/11/2017 0928   RDW 14.0 07/11/2017 0928   LYMPHSABS 1.0 07/11/2017 0928   MONOABS 1.2 (H) 07/11/2017 0928   EOSABS 0.3 07/11/2017 0928   BASOSABS 0.0 07/11/2017 0928    BMET    Component Value Date/Time   NA 137 07/11/2017 0928   K 3.3 (L) 07/11/2017 0928   CL 101 07/11/2017 0928   CO2 25 07/11/2017 0928   GLUCOSE 138 (H) 07/11/2017 0928   BUN 28 (H) 07/11/2017 0928   CREATININE 1.24 (H) 07/11/2017 0928   CALCIUM 9.0 07/11/2017 0928   GFRNONAA 38 (L) 07/11/2017 0928   GFRAA 44 (L) 07/11/2017 0928    INR No results found for: INR   Intake/Output Summary (Last 24 hours) at 07/12/2017 0915 Last data filed at 07/11/2017 2210 Gross per 24 hour  Intake 580 ml  Output 0 ml  Net 580 ml     Assessment/Plan:  84 y.o. female is s/p left below knee amputation   -pt continues to be nauseated.  Pt's pain is getting better.  Discontinued Percocet and will stick with Tramadol in case this is making her nauseated.   -pt needs to eat - changed diet to regular -abdominal xray with no evidence of ileus or obstruction but signs of constipation.  May benefit from suppository or enema. -continue Pravalon boot to right foot to help with pressure sore right heel   Doreatha MassedSamantha Rowene Suto, PA-C [facility] [phone] 07/12/2017 9:15 AM

## 2017-07-12 NOTE — IPOC Note (Signed)
Overall Plan of Care (IPOC) Patient Details Name: [name] Petersen MRN: [id number] DOB: 01/16/1927  Admitting Diagnosis: <principal problem not specified>  Hospital Problems: Active Problems:   S/P below knee amputation, left (HCC)   Essential hypertension   Stage 3 chronic kidney disease (HCC)   Postoperative pain   Diabetes mellitus type 2 in obese (HCC)   Hypokalemia   Drug-induced constipation     Functional Problem List: Nursing Bladder, Bowel, Medication Management, Pain, Nutrition, Skin Integrity  PT Balance, Endurance, Motor, Pain, Sensory  OT Balance, Cognition, Endurance, Motor, Pain, Safety, Skin Integrity  SLP    TR         Basic ADL's: OT Grooming, Bathing, Dressing, Toileting     Advanced  ADL's: OT Simple Meal Preparation     Transfers: PT Bed Mobility, Car, Bed to Chair, Occupational psychologisturniture  OT Toilet, Research scientist (life sciences)Tub/Shower     Locomotion: PT Wheelchair Mobility     Additional Impairments: OT None  SLP        TR      Anticipated Outcomes Item Anticipated Outcome  Self Feeding    Swallowing      Basic self-care  Min assist  Toileting  Min assist   Bathroom Transfers Min assist  Bowel/Bladder  min assist  Transfers  min assist for basic and car  Locomotion  Supervision w/c x 150'  Communication     Cognition     Pain  less than 3 out of 10  Safety/Judgment  min assist   Therapy Plan: PT Intensity: Minimum of 1-2 x/day ,45 to 90 minutes PT Frequency: 5 out of 7 days PT Duration Estimated Length of Stay: 14-17 OT Intensity: Minimum of 1-2 x/day, 45 to 90 minutes OT Frequency: 5 out of 7 days OT Duration/Estimated Length of Stay: 14-18 days      Team Interventions: Nursing Interventions Patient/Family Education, Discharge Planning, Bladder Management, Bowel Management, Pain Management, Medication Management, Skin Care/Wound Management  PT interventions Community reintegration, DME/adaptive equipment instruction, Neuromuscular re-education,  Psychosocial support, UE/LE Strength taining/ROM, Wheelchair propulsion/positioning, Warden/rangerBalance/vestibular training, Discharge planning, Pain management, Skin care/wound management, Therapeutic Activities, UE/LE Coordination activities, Cognitive remediation/compensation, Functional mobility training, Patient/family education, Splinting/orthotics, Therapeutic Exercise  OT Interventions Balance/vestibular training, Cognitive remediation/compensation, Discharge planning, Disease mangement/prevention, DME/adaptive equipment instruction, Functional mobility training, Pain management, Patient/family education, Psychosocial support, Self Care/advanced ADL retraining, Skin care/wound managment, Splinting/orthotics, Therapeutic Activities, Therapeutic Exercise, UE/LE Strength taining/ROM, Wheelchair propulsion/positioning  SLP Interventions    TR Interventions    SW/CM Interventions Discharge Planning, Psychosocial Support, Patient/Family Education   Barriers to Discharge MD  Medical stability, Wound care and Weight bearing restrictions  Nursing      PT      OT Incontinence    SLP      SW       Team Discharge Planning: Destination: PT-Home ,OT- Home , SLP-  Projected Follow-up: PT-Home health PT, OT-  Home health OT, 24 hour supervision/assistance, SLP-  Projected Equipment Needs: PT-Wheelchair (measurements), Wheelchair cushion (measurements), OT- 3 in 1 bedside comode, SLP-  Equipment Details: PT-amputee pad and possibly w/c back, OT-  Patient/family involved in discharge planning: PT- Patient, Family member/caregiver,  OT-Patient, Family member/caregiver, SLP-   MD ELOS: 10-14 days. Medical Rehab Prognosis:  Good Assessment: 84 y.o. right-handed female with history of CVA maintained on Plavix, hypertension, diet-controlled diabetes mellitus,CKD stage III.  Presented 07/06/2017 with critical limb ischemia left lower extremity with unreconstructable distal tibial disease.  No change with  conservative care.  Underwent left  BKA 07/06/2017 per Dr. Arbie Cookey.  Hospital course pain management.  Monitoring of creatinine 1.97 from baseline 1.30-1.59. Patient with resulting functional deficits with mobility, self-care, transfers, endurance.  Will set goals for Min A with PT/OT.   See Team Conference Notes for weekly updates to the plan of care

## 2017-07-12 NOTE — Progress Notes (Signed)
Notified by patients daughter that patient continue to have nausea and back pain. Patient assessed but refused pain medication, Repositioned to riight side, back rub and HOB elevated,Resting and monitored, call bell within reach, and bed alarm in placed.

## 2017-07-13 ENCOUNTER — Inpatient Hospital Stay (HOSPITAL_COMMUNITY): Payer: Medicare Other | Admitting: Occupational Therapy

## 2017-07-13 ENCOUNTER — Inpatient Hospital Stay (HOSPITAL_COMMUNITY): Payer: Medicare Other | Admitting: Physical Therapy

## 2017-07-13 DIAGNOSIS — E639 Nutritional deficiency, unspecified: Secondary | ICD-10-CM

## 2017-07-13 DIAGNOSIS — G479 Sleep disorder, unspecified: Secondary | ICD-10-CM

## 2017-07-13 MED ORDER — MIRTAZAPINE 15 MG PO TABS
7.5000 mg | ORAL_TABLET | Freq: Every day | ORAL | Status: DC
  Administered 2017-07-13: 7.5 mg via ORAL
  Filled 2017-07-13: qty 1

## 2017-07-13 MED ORDER — MELATONIN 3 MG PO TABS
1.5000 mg | ORAL_TABLET | Freq: Every day | ORAL | Status: DC
  Administered 2017-07-13: 1.5 mg via ORAL
  Filled 2017-07-13: qty 0.5

## 2017-07-13 MED ORDER — NON FORMULARY: 1.5000 mg | Freq: Every day | Status: DC

## 2017-07-13 NOTE — Progress Notes (Signed)
[place] PHYSICAL MEDICINE & REHABILITATION     PROGRESS NOTE  Subjective/Complaints:  Patient seen lying in bed this morning different daughter at bedside. Patient's daughter states that patient slept fairly overnight. Patient notes that she had a small BM yesterday. She also requestsher sleep meds to be scheduled. She notes her "stomach feels hot". Daughter asks for association with heparin injection.  ROS: Denies CP, SOB, nausea, vomiting, diarrhea.  Objective: Vital Signs: Blood pressure (!) 176/90, pulse 79, temperature 98.4 F (36.9 C), temperature source Oral, resp. rate 17, height 5\' 2"  (1.575 m), weight 76 kg (167 lb 8.8 oz), SpO2 98 %. Dg Abd 1 View  Result Date: 07/12/2017 CLINICAL DATA:  No bowel movement since amputation of the leg 1 weeks ago. There is abdominal distension. EXAM: ABDOMEN - 1 VIEW COMPARISON:  Lumbar spine series of June 10, 2017 FINDINGS: The stool burden is increased throughout the colon. There is no evidence of a small bowel obstruction or fecal impaction. There is a small amount of gas in the rectum. There is chronic levocurvature centered in the mid lumbar spine. There are degenerative changes of both hips. IMPRESSION: Increased colonic stool burden diffusely consistent with constipation. There is no evidence of ileus, obstruction, or fecal impaction. Electronically Signed   By: David  [place] M.D.   On: 07/12/2017 09:14   Recent Labs    07/11/17 0928  WBC 8.2  HGB 13.3  HCT 38.6  PLT 223   Recent Labs    07/11/17 0928  NA 137  K 3.3*  CL 101  GLUCOSE 138*  BUN 28*  CREATININE 1.24*  CALCIUM 9.0   CBG (last 3)  Recent Labs    07/11/17 2124  GLUCAP 116*    Wt Readings from Last 3 Encounters:  07/11/17 76 kg (167 lb 8.8 oz)  07/06/17 76.2 kg (168 lb)  06/16/17 76.2 kg (168 lb)    Physical Exam:  BP (!) 176/90 (BP Location: Right Arm)   Pulse 79   Temp 98.4 F (36.9 C) (Oral)   Resp 17   Ht 5\' 2"  (1.575 m)   Wt 76 kg (167 lb  8.8 oz)   SpO2 98%   BMI 30.65 kg/m  Gen.: well-developed. Well-nourished. + Distressed HENT: Normocephalic. Atraumatic. Eyes: EOMI. No discharge.  Cardiovascular: RRR. No JVD. Respiratory: Clear. unlabored.  GI: Bowel sounds are normal. She exhibits no distension.  Musculoskeletal: Left stump edema and extremely tender to palpation with minimal touch, even well proximal to incision site Neurological.   Alert and oriented Follows simple commands Motor: Bilateral upper extremities: 4+/5 proximal to distal Right lower extremity: Hip flexion, knee extension 4-4+/5, ankle dorsiflexion 5/5  Left lower extremity: Hip flexion 4/5 (some pain inhibition) Skin: Incision with staples C/D/I Psych: Flat.  Assessment/Plan: 1. Functional deficits secondary to left BKA which require 3+ hours per day of interdisciplinary therapy in a comprehensive inpatient rehab setting. Physiatrist is providing close team supervision and 24 hour management of active medical problems listed below. Physiatrist and rehab team continue to assess barriers to discharge/monitor patient progress toward functional and medical goals.  Function:  Bathing Bathing position   Position: Bed  Bathing parts   Body parts bathed by helper: Right arm, Left arm, Chest, Abdomen, Front perineal area, Buttocks, Right upper leg, Left upper leg, Right lower leg, Back  Bathing assist Assist Level: 2 helpers      Upper Body Dressing/Undressing Upper body dressing   What is the patient wearing?: Hospital gown  Upper body assist        Lower Body Dressing/Undressing Lower body dressing   What is the patient wearing?: Pants       Pants- Performed by helper: Thread/unthread right pants leg, Thread/unthread left pants leg, Pull pants up/down                      Lower body assist Assist for lower body dressing: 2 Helpers      Toileting Toileting     Toileting steps completed by helper: Adjust  clothing prior to toileting, Performs perineal hygiene, Adjust clothing after toileting    Toileting assist Assist level: Two helpers   Transfers Chair/bed transfer Chair/bed transfer activity did not occur: Safety/medical concerns(pain, N and V) Chair/bed transfer method: Lateral scoot Chair/bed transfer assist level: Total assist (Pt < 25%) Chair/bed transfer assistive device: Sliding board     Locomotion Ambulation Ambulation activity did not occur: Safety/medical concerns(pain, N and V)         Wheelchair Wheelchair activity did not occur: Safety/medical concerns(pain, N and V)        Cognition Comprehension Comprehension assist level: Follows complex conversation/direction with extra time/assistive device  Expression Expression assist level: Expresses basic 50 - 74% of the time/requires cueing 25 - 49% of the time. Needs to repeat parts of sentences.  Social Interaction Social Interaction assist level: Interacts appropriately 75 - 89% of the time - Needs redirection for appropriate language or to initiate interaction.  Problem Solving Problem solving assist level: Solves basic 25 - 49% of the time - needs direction more than half the time to initiate, plan or complete simple activities  Memory Memory assist level: Recognizes or recalls 50 - 74% of the time/requires cueing 25 - 49% of the time    Medical Problem List and Plan: 1.  Decreased functional mobility secondary to left BKA secondary to peripheral vascular disease 07/06/2017   Continue CIR 2.  DVT Prophylaxis/Anticoagulation: Subcutaneous heparin. Monitor for any bleeding episodes 3. Pain Management: Percocet changed to tramadol by Vascular   Patient will need a significant amount of encouragement related to poor coping mechanisms 4. Mood: Provide emotional support 5. Neuropsych: This patient is ?fully capable of making decisions on her own behalf. 6. Skin/Wound Care: Routine skin checks 7.  Fluids/Electrolytes/Nutrition: Routine I/O's    Limited nutritional intake Remeron 7.5 started on 3/27, Should also improve with bowel movements   Encourage supplements 8.  CKD stage III.  Creatinine baseline 1.30-1.59.     Creatinine 1.24 on 3/25   Encourage fluids   Continue to monitor 9.  Hypertension.  Norvasc 10 mg daily, HCTZ 12.5 mg twice daily.      Monitor with increased mobility   Cozaar 50 started on 3/26   Remains elevated, will consider further medication adjustments tomorrow 10.  History of CVA.  Continue Plavix. 11.  Diet controlled diabetes mellitus.  Hemoglobin A1c 6.0.      Will intermittently monitor with liberalized diet by vascular 12. Obesity   Body mass index is 30.65 kg/m.   Diet and exercise education   Encourage weight loss to increase endurance and promote overall health 13. Hypokalemia   K+ 3.3 on 3/25   Supplemented x2 days 14. Drug induced constipation   KUB reviewed, suggesting constipation   Bowel reg increased on 3/26 15. Sleep disturbance   Remeron initiated on 3/27   Melatonin 1.5mg  started on 3/27  LOS (Days) 3 A FACE TO FACE EVALUATION WAS PERFORMED  [name] Petersen [name] Petersen [name] Petersen 07/13/2017 8:32 AM

## 2017-07-13 NOTE — Progress Notes (Signed)
Occupational Therapy Session Note  Patient Details  Name: [name] Petersen MRN: [id number] Date of Birth: 07/14/1926  Today's Date: 07/13/2017 OT Individual Time: [id number] and [id number] OT Individual Time Calculation (min): 60 min and 56 min   Short Term Goals: Week 1:  OT Short Term Goal 1 (Week 1): Pt will complete bathing with mod assist OT Short Term Goal 2 (Week 1): Pt will complete LB dressing with max assist of 1 caregiver OT Short Term Goal 3 (Week 1): Pt will complete toilet transfer with max assist of 1 caregiver OT Short Term Goal 4 (Week 1): Pt will remain OOB for 1 hour to increase activity tolerance/endurance  Skilled Therapeutic Interventions/Progress Updates:   1) Treatment session with focus on increased participation in self-care tasks and transfers.  Pt received supine in bed, daughter reporting she ate some breakfast this AM.  Engaged in bathing at bed level for LB, progressing to sitting EOB for UB.  Pt demonstrating increased ability to roll Rt and Lt for LB bathing/dressing with no c/o pain.  Mod assist supine > sit with manual facilitation to promote anterior weight shift and provide truncal support.  Min guard for sitting balance on EOB while completing bathing and UB dressing.  Pt demonstrating carryover of weight shift and head/hips relationship as needed for transfer with slide board.  Max assist slide board transfer initially, progressing to Mod when pt able to reach arm rest and assist with remainder of transfer.  Engaged in grooming tasks seated at sink with setup for items.  Pt reports pain in residual limb, notified RN, and provided increased padding to leg rest.  Discussed limb wrapping and plan to provide further education and handouts during PM session with pt and daughter reporting understanding of purpose of limb wrapping.  2) Treatment session with focus on slide board transfers and care for residual limb.  Pt received supine in bed with MD present discussing  arteriography of Rt leg and Lt BKA healing.  Completed bed mobility with mod assist.  Pt reports dizziness upon sitting EOB, pt reports this happens occasionally but not every time.  Completed slide board transfer to Lt with mod assist, pt demonstrating increased weight shift however still requiring cues for head/hips relationship.  Pt extremely fatigued, requiring max cues to arouse.  Educated pt and pt's daughter on residual limb care with tapping, massage, and use of inspection mirror to assess for areas of concern.  Provided pt's daughter with handouts about desensitization strategies, limb inspection, and limb wrapping.  Pt returned to bed via slide board with mod assist with improved weight shift.    Therapy Documentation Precautions:  Precautions Precautions: Fall Restrictions Weight Bearing Restrictions: Yes LLE Weight Bearing: Non weight bearing Pain:  Pt with c/o pain residual limb, not rated.  Notified RN.  Repositioned  See Function Navigator for Current Functional Status.   Therapy/Group: Individual Therapy  Rosalio LoudHOXIE, Deneice Wack 07/13/2017, 9:50 AM

## 2017-07-13 NOTE — Progress Notes (Signed)
Patient ID: [name] Petersen, female   DOB: 05/04/1926, 84 y.o.   MRN: [id number] Comfortable and much more alert today. Left below-knee amputation with some serosanguineous drainage and some bruising on the anterior flap.  No evidence of skin breakdown.  No change in her right heel blister.  Approximately 2-3 cm and looks as though he did very well may be full-thickness.  Discussed with the patient and her daughter present.  The daughter is questioning need for arteriography for evaluation of her right leg.  Certainly would not recommend major operative revascularization but she may have endovascular treatment options.  Also explained that with her having popliteal and tibial occlusive disease on the left that very well may be symmetric but would not know without arteriography.  Renal function is stable.  Plan right leg arteriogram on Friday, 07/15/2017

## 2017-07-13 NOTE — Progress Notes (Signed)
Physical Therapy Session Note  Patient Details  Name: [name] Petersen MRN: [id number] Date of Birth: 07/23/1926  Today's Date: 07/13/2017 PT Individual Time: 0945-1100 PT Individual Time Calculation (min): 75 min   Short Term Goals: Week 1:  PT Short Term Goal 1 (Week 1): pt will roll L >< R with mod assist PT Short Term Goal 2 (Week 1): pt will move supine> sit with mod assist PT Short Term Goal 3 (Week 1): pt will transfer w/c>< bed wiht assist of 1 person PT Short Term Goal 4 (Week 1): pt will propel w/c x 50' with min assist PT Short Term Goal 5 (Week 1): pt will state 3 components of basic transfer  Skilled Therapeutic Interventions/Progress Updates: Pt presented in w/c agreeable to therapy. Pt c/o increased pain today 8/10, nsg present to administer meds. Pt propelled 9275ft for UE strengthening and endurance. Pt performed SB transfer to L maxA with multimodal cues for improving head hips relationship. Sitting EOM pt participated in sitting static and dynamic balance activities including reaching for horse shoes, use of zoom ball 30 sec x 3, playing catch, and picking up cups from bench with encouragement to increase anterior wt shifting. Pt becoming more fearful with moderate challenge outside BOS however was able to perform and return to midline with min guard. Performed sit to/from stand x 3 initially from elevated mat then from lowered mat with maxA. Pt able to maintain standing 10 progressing to 20 seconds with cues for erect posture and correct hand placement in RW. Performed SB transfer back to w/c with 4in step placed under pt's RLE. Pt able to perform with modA. Pt transported to day room and performed UBE 1 min forward/backwards ea. Pt returned to room and remained in w/c at end of session with call bell within reach and dgt present.      Therapy Documentation Precautions:  Precautions Precautions: Fall Restrictions Weight Bearing Restrictions: Yes LLE Weight Bearing: Non weight  bearing  See Function Navigator for Current Functional Status.   Therapy/Group: Individual Therapy  Tammara Massing  Justeen Hehr, PTA  07/13/2017, 12:22 PM

## 2017-07-14 ENCOUNTER — Ambulatory Visit (HOSPITAL_COMMUNITY)
Admission: RE | Admit: 2017-07-14 | Discharge: 2017-07-15 | Disposition: A | Discharge status: Rehabilitation Facility | Payer: Medicare Other | Source: Ambulatory Visit | Attending: Vascular Surgery | Admitting: Vascular Surgery

## 2017-07-14 ENCOUNTER — Ambulatory Visit (HOSPITAL_COMMUNITY): Payer: Medicare Other | Admitting: Occupational Therapy

## 2017-07-14 ENCOUNTER — Ambulatory Visit (HOSPITAL_COMMUNITY): Admission: RE | Admit: 2017-07-14 | Payer: Medicare Other | Source: Ambulatory Visit | Admitting: Vascular Surgery

## 2017-07-14 ENCOUNTER — Encounter (HOSPITAL_COMMUNITY)
Admission: RE | Disposition: A | Discharge status: Rehabilitation Facility | Payer: Self-pay | Source: Ambulatory Visit | Attending: Vascular Surgery

## 2017-07-14 ENCOUNTER — Encounter (HOSPITAL_COMMUNITY): Admission: RE | Payer: Self-pay | Source: Ambulatory Visit

## 2017-07-14 ENCOUNTER — Inpatient Hospital Stay (HOSPITAL_COMMUNITY): Payer: Medicare Other

## 2017-07-14 ENCOUNTER — Inpatient Hospital Stay (HOSPITAL_COMMUNITY): Payer: Medicare Other | Admitting: Physical Therapy

## 2017-07-14 ENCOUNTER — Inpatient Hospital Stay (HOSPITAL_COMMUNITY): Payer: Medicare Other | Admitting: Occupational Therapy

## 2017-07-14 DIAGNOSIS — I701 Atherosclerosis of renal artery: Secondary | ICD-10-CM | POA: Insufficient documentation

## 2017-07-14 DIAGNOSIS — L98499 Non-pressure chronic ulcer of skin of other sites with unspecified severity: Secondary | ICD-10-CM

## 2017-07-14 DIAGNOSIS — I70234 Atherosclerosis of native arteries of right leg with ulceration of heel and midfoot: Secondary | ICD-10-CM | POA: Diagnosis not present

## 2017-07-14 DIAGNOSIS — Z6828 Body mass index (BMI) 28.0-28.9, adult: Secondary | ICD-10-CM | POA: Insufficient documentation

## 2017-07-14 DIAGNOSIS — H3530 Unspecified macular degeneration: Secondary | ICD-10-CM | POA: Insufficient documentation

## 2017-07-14 DIAGNOSIS — L97919 Non-pressure chronic ulcer of unspecified part of right lower leg with unspecified severity: Secondary | ICD-10-CM | POA: Insufficient documentation

## 2017-07-14 DIAGNOSIS — L89613 Pressure ulcer of right heel, stage 3: Secondary | ICD-10-CM | POA: Insufficient documentation

## 2017-07-14 DIAGNOSIS — Z8673 Personal history of transient ischemic attack (TIA), and cerebral infarction without residual deficits: Secondary | ICD-10-CM | POA: Insufficient documentation

## 2017-07-14 DIAGNOSIS — I70239 Atherosclerosis of native arteries of right leg with ulceration of unspecified site: Secondary | ICD-10-CM | POA: Insufficient documentation

## 2017-07-14 DIAGNOSIS — E11621 Type 2 diabetes mellitus with foot ulcer: Secondary | ICD-10-CM | POA: Insufficient documentation

## 2017-07-14 DIAGNOSIS — M19041 Primary osteoarthritis, right hand: Secondary | ICD-10-CM | POA: Insufficient documentation

## 2017-07-14 DIAGNOSIS — I70209 Unspecified atherosclerosis of native arteries of extremities, unspecified extremity: Secondary | ICD-10-CM | POA: Diagnosis present

## 2017-07-14 DIAGNOSIS — E1151 Type 2 diabetes mellitus with diabetic peripheral angiopathy without gangrene: Secondary | ICD-10-CM | POA: Insufficient documentation

## 2017-07-14 DIAGNOSIS — I129 Hypertensive chronic kidney disease with stage 1 through stage 4 chronic kidney disease, or unspecified chronic kidney disease: Secondary | ICD-10-CM | POA: Insufficient documentation

## 2017-07-14 DIAGNOSIS — M19042 Primary osteoarthritis, left hand: Secondary | ICD-10-CM | POA: Insufficient documentation

## 2017-07-14 DIAGNOSIS — N183 Chronic kidney disease, stage 3 (moderate): Secondary | ICD-10-CM | POA: Insufficient documentation

## 2017-07-14 DIAGNOSIS — E669 Obesity, unspecified: Secondary | ICD-10-CM | POA: Insufficient documentation

## 2017-07-14 DIAGNOSIS — Z7902 Long term (current) use of antithrombotics/antiplatelets: Secondary | ICD-10-CM | POA: Insufficient documentation

## 2017-07-14 DIAGNOSIS — E1122 Type 2 diabetes mellitus with diabetic chronic kidney disease: Secondary | ICD-10-CM | POA: Insufficient documentation

## 2017-07-14 DIAGNOSIS — Z89512 Acquired absence of left leg below knee: Secondary | ICD-10-CM | POA: Insufficient documentation

## 2017-07-14 HISTORY — DX: Non-pressure chronic ulcer of skin of other sites with unspecified severity: L98.499

## 2017-07-14 HISTORY — DX: Unspecified atherosclerosis of native arteries of extremities, unspecified extremity: I70.209

## 2017-07-14 HISTORY — PX: LOWER EXTREMITY ANGIOGRAPHY: CATH118251

## 2017-07-14 HISTORY — DX: Peripheral vascular disease, unspecified: I73.9

## 2017-07-14 LAB — POCT I-STAT, CHEM 8
BUN: 18 mg/dL (ref 6–20)
Calcium, Ion: 1.22 mmol/L (ref 1.15–1.40)
Chloride: 96 mmol/L — ABNORMAL LOW (ref 101–111)
Creatinine, Ser: 1 mg/dL (ref 0.44–1.00)
Glucose, Bld: 94 mg/dL (ref 65–99)
HCT: 36 % (ref 36.0–46.0)
Hemoglobin: 12.2 g/dL (ref 12.0–15.0)
Potassium: 3.7 mmol/L (ref 3.5–5.1)
Sodium: 137 mmol/L (ref 135–145)
TCO2: 27 mmol/L (ref 22–32)

## 2017-07-14 LAB — GLUCOSE, CAPILLARY: Glucose-Capillary: 85 mg/dL (ref 65–99)

## 2017-07-14 SURGERY — LOWER EXTREMITY ANGIOGRAPHY
Anesthesia: LOCAL

## 2017-07-14 MED ORDER — MIDAZOLAM HCL 2 MG/2ML IJ SOLN
INTRAMUSCULAR | Status: DC | PRN
  Administered 2017-07-14: 1 mg via INTRAVENOUS

## 2017-07-14 MED ORDER — SODIUM CHLORIDE 0.9 % IV SOLN: 250.0000 mL | INTRAVENOUS | Status: DC | PRN

## 2017-07-14 MED ORDER — TRAMADOL HCL 50 MG PO TABS
100.0000 mg | ORAL_TABLET | Freq: Once | ORAL | Status: AC
  Administered 2017-07-14: 100 mg via ORAL

## 2017-07-14 MED ORDER — ADULT MULTIVITAMIN W/MINERALS CH
1.0000 | ORAL_TABLET | Freq: Every day | ORAL | Status: DC
  Administered 2017-07-14 – 2017-07-15 (×2): 1 via ORAL
  Filled 2017-07-14 (×3): qty 1

## 2017-07-14 MED ORDER — SODIUM CHLORIDE 0.9 % WEIGHT BASED INFUSION: 1.0000 mL/kg/h | INTRAVENOUS | Status: AC

## 2017-07-14 MED ORDER — AMLODIPINE BESYLATE 5 MG PO TABS
10.0000 mg | ORAL_TABLET | Freq: Every day | ORAL | Status: DC
  Administered 2017-07-14 – 2017-07-15 (×2): 10 mg via ORAL
  Filled 2017-07-14 (×2): qty 2

## 2017-07-14 MED ORDER — HEPARIN (PORCINE) IN NACL 2-0.9 UNIT/ML-% IJ SOLN
INTRAMUSCULAR | Status: AC
  Filled 2017-07-14: qty 500

## 2017-07-14 MED ORDER — LABETALOL HCL 5 MG/ML IV SOLN: 10.0000 mg | INTRAVENOUS | Status: DC | PRN

## 2017-07-14 MED ORDER — SODIUM CHLORIDE 0.9% FLUSH
3.0000 mL | Freq: Two times a day (BID) | INTRAVENOUS | Status: DC
  Administered 2017-07-15 (×2): 3 mL via INTRAVENOUS

## 2017-07-14 MED ORDER — LABETALOL HCL 5 MG/ML IV SOLN
INTRAVENOUS | Status: AC
  Filled 2017-07-14: qty 4

## 2017-07-14 MED ORDER — LIDOCAINE HCL (PF) 1 % IJ SOLN
INTRAMUSCULAR | Status: DC | PRN
  Administered 2017-07-14: 20 mL

## 2017-07-14 MED ORDER — IODIXANOL 320 MG/ML IV SOLN
INTRAVENOUS | Status: DC | PRN
  Administered 2017-07-14: 85 mL via INTRA_ARTERIAL

## 2017-07-14 MED ORDER — HEPARIN (PORCINE) IN NACL 2-0.9 UNIT/ML-% IJ SOLN
INTRAMUSCULAR | Status: AC | PRN
  Administered 2017-07-14 (×2): 500 mL

## 2017-07-14 MED ORDER — FENTANYL CITRATE (PF) 100 MCG/2ML IJ SOLN
INTRAMUSCULAR | Status: DC | PRN
  Administered 2017-07-14 (×2): 25 ug via INTRAVENOUS

## 2017-07-14 MED ORDER — ONDANSETRON HCL 4 MG/2ML IJ SOLN: 4.0000 mg | Freq: Four times a day (QID) | INTRAMUSCULAR | Status: DC | PRN

## 2017-07-14 MED ORDER — FENTANYL CITRATE (PF) 100 MCG/2ML IJ SOLN
INTRAMUSCULAR | Status: AC
  Filled 2017-07-14: qty 2

## 2017-07-14 MED ORDER — ACETAMINOPHEN 325 MG PO TABS: 650.0000 mg | ORAL_TABLET | ORAL | Status: DC | PRN

## 2017-07-14 MED ORDER — MIDAZOLAM HCL 2 MG/2ML IJ SOLN
INTRAMUSCULAR | Status: AC
  Filled 2017-07-14: qty 2

## 2017-07-14 MED ORDER — CLOPIDOGREL BISULFATE 75 MG PO TABS
75.0000 mg | ORAL_TABLET | Freq: Every day | ORAL | Status: DC
  Administered 2017-07-14 – 2017-07-15 (×2): 75 mg via ORAL
  Filled 2017-07-14 (×2): qty 1

## 2017-07-14 MED ORDER — OXYCODONE-ACETAMINOPHEN 5-325 MG PO TABS
1.0000 | ORAL_TABLET | Freq: Four times a day (QID) | ORAL | Status: DC | PRN
  Administered 2017-07-15: 09:00:00 1 via ORAL
  Filled 2017-07-14: qty 1

## 2017-07-14 MED ORDER — LABETALOL HCL 5 MG/ML IV SOLN
INTRAVENOUS | Status: DC | PRN
  Administered 2017-07-14: 10 mg via INTRAVENOUS

## 2017-07-14 MED ORDER — LIDOCAINE HCL 1 % IJ SOLN
INTRAMUSCULAR | Status: AC
  Filled 2017-07-14: qty 20

## 2017-07-14 MED ORDER — SODIUM CHLORIDE 0.9% FLUSH: 3.0000 mL | INTRAVENOUS | Status: DC | PRN

## 2017-07-14 MED ORDER — LORAZEPAM 2 MG/ML IJ SOLN
1.0000 mg | Freq: Once | INTRAMUSCULAR | Status: AC
  Administered 2017-07-14: 21:00:00 1 mg via INTRAVENOUS
  Filled 2017-07-14: qty 1

## 2017-07-14 MED ORDER — ACETAMINOPHEN 325 MG PO TABS: 325.0000 mg | ORAL_TABLET | Freq: Four times a day (QID) | ORAL | Status: DC | PRN

## 2017-07-14 MED ORDER — HYDRALAZINE HCL 20 MG/ML IJ SOLN
5.0000 mg | INTRAMUSCULAR | Status: DC | PRN
  Administered 2017-07-15: 5 mg via INTRAVENOUS
  Filled 2017-07-14: qty 1

## 2017-07-14 MED ORDER — HYDRALAZINE HCL 20 MG/ML IJ SOLN: 10.0000 mg | INTRAMUSCULAR | Status: DC | PRN

## 2017-07-14 SURGICAL SUPPLY — 15 items
CATH ANGIO 5F PIGTAIL 65CM (CATHETERS) ×1 IMPLANT
CATH CROSS OVER TEMPO 5F (CATHETERS) ×1 IMPLANT
CATH SOFT-VU 4F 65 STRAIGHT (CATHETERS) IMPLANT
CATH SOFT-VU STRAIGHT 4F 65CM (CATHETERS) ×2
COVER PRB 48X5XTLSCP FOLD TPE (BAG) IMPLANT
COVER PROBE 5X48 (BAG) ×2
DEVICE CLOSURE MYNXGRIP 5F (Vascular Products) ×1 IMPLANT
KIT MICROPUNCTURE NIT STIFF (SHEATH) ×1 IMPLANT
KIT PV (KITS) ×2 IMPLANT
SHEATH AVANTI 11CM 5FR (SHEATH) ×1 IMPLANT
SYR MEDRAD MARK V 150ML (SYRINGE) ×2 IMPLANT
TRANSDUCER W/STOPCOCK (MISCELLANEOUS) ×2 IMPLANT
TRAY PV CATH (CUSTOM PROCEDURE TRAY) ×2 IMPLANT
WIRE BENTSON .035X145CM (WIRE) ×1 IMPLANT
WIRE ROSEN-J .035X260CM (WIRE) ×1 IMPLANT

## 2017-07-14 NOTE — Progress Notes (Signed)
Social Work Patient ID: [name] Petersen, female   DOB: 01/19/1927, 84 y.o.   MRN: [id number]    [name] Petersen  Social Worker  General Practice  Patient Care Conference  Signed  Date of Service:  07/14/2017 12:08 PM          Signed          [] Hide copied text  [] Hover for details   Inpatient RehabilitationTeam Conference and Plan of Care Update Date: 07/13/2017   Time: 2:50 PM      Patient Name: [name] Petersen      Medical Record Number: [id number]  Date of Birth: Sep 05, 1926 Sex: Female         Room/Bed: 4M07C/4M07C-01 Payor Info: Payor: MEDICARE / Plan: MEDICARE PART A AND B / Product Type: *No Product type* /     Admitting Diagnosis: L BKA   Admit Date/Time:  07/10/2017  4:44 PM Admission Comments: No comment available    Primary Diagnosis:  <principal problem not specified> Principal Problem: <principal problem not specified>       Patient Active Problem List    Diagnosis Date Noted  . Atherosclerotic PVD with ulceration (HCC) 07/14/2017  . Pressure injury of right heel, stage 3 (HCC)    . Sleep disturbance    . Poor nutrition    . Hypokalemia    . Drug-induced constipation    . Essential hypertension    . Stage 3 chronic kidney disease (HCC)    . Postoperative pain    . Diabetes mellitus type 2 in obese (HCC)    . S/P below knee amputation, left (HCC) 07/10/2017  . PAD (peripheral artery disease) (HCC) 07/06/2017  . Atherosclerosis of native arteries of the extremities with ulceration (HCC) 06/16/2017      Expected Discharge Date: Expected Discharge Date: 07/26/17   Team Members Present: Physician leading conference: Dr. Maryla Morrow Social Worker Present: Amada Jupiter, LCSW Nurse Present: Chrissie Noa, RN PT Present: Grier Rocher, PT;Karen Donaworth, PT;Rosita Dechalus, PTA OT Present: Rosalio Loud, OT SLP Present: Jackalyn Lombard, SLP PPS Coordinator present : Tora Duck, RN, CRRN       Current Status/Progress Goal Weekly Team Focus  Medical     Decreased functional mobility secondary to left BKA secondary to peripheral vascular disease 07/06/2017  Improve mobility, safety, transfers, pain, BP/DM, consitpation, hypokalemia, sleep  See above   Bowel/Bladder     Incotinent of Bladder and Bowel, LBM 3/26   Continence of Bladder /Bowel  Assess for urinary and bowel issues QS, adminster laxative prophylacticas orderedwithi2-3 days    Swallow/Nutrition/ Hydration               ADL's     max-total assist bathing/dressing at bed level, max-total assist slide board transfers, +2 for toileting   min assist overall  ADL retraining, transfers, activity tolerance/endurance, pt/family education   Mobility     maxA SB transfer, maxA sit to stand from elevated mat, mod/maxA bed mobility with use of features, minA w/c mobility   S bed, min assist all transfer, S w/c; no gait  bed mobility, sitting balance, SB transfers, endurance, d/c planning   Communication               Safety/Cognition/ Behavioral Observations   Alert oriented cooperative, able to make her needs know no reported falls or injuries  No falls or injuries while in  Rehab  Assess and educate patient, families on safety protocol and policy and monitor for safety  concerns QS and prn   Pain     Tramadol Q12 hrs admiistered for surgical wound left BKA and generalized pain rate pain 8/10  < 3  Assess and address pain QS and reassess effectiveness of medication or alternate methods of treatment   Skin     S/P surgical wound to Left stump area,      Assess skin and surgical area QS and document outcomes      Rehab Goals Patient on target to meet rehab goals: Yes *See Care Plan and progress notes for long and short-term goals.      Barriers to Discharge   Current Status/Progress Possible Resolutions Date Resolved   Physician     Medical stability;Weight bearing restrictions     See above  Therapies, optimize pain meds, follow labs, otpimize DM/BP, bowel, sleep meds, supplement K+        Nursing                 PT                    OT Incontinence               SLP            SW              Discharge Planning/Teaching Needs:  home with family to coordinate 24/7 assistance      Team Discussion:  MD addressing bowel issues/ nausea.  Currently mod-max assist with PT and ot and goals being set up withfor supervision - min assist overall.  Anticipate mostly w/c level goals.  Revisions to Treatment Plan:  None    Continued Need for Acute Rehabilitation Level of Care: The patient requires daily medical management by a physician with specialized training in physical medicine and rehabilitation for the following conditions: Daily direction of a multidisciplinary physical rehabilitation program to ensure safe treatment while eliciting the highest outcome that is of practical value to the patient.: Yes Daily medical management of patient stability for increased activity during participation in an intensive rehabilitation regime.: Yes Daily analysis of laboratory values and/or radiology reports with any subsequent need for medication adjustment of medical intervention for : Urological problems;Other;Renal problems;Diabetes problems;Blood pressure problems;Post surgical problems;Nutritional problems   [name] Petersen 07/14/2017, 4:15 PM

## 2017-07-14 NOTE — Progress Notes (Signed)
Patient ID: [name] Petersen, female   DOB: 03/01/1927, 84 y.o.   MRN: [id number] Resting this morning. And for right leg arteriogram today with Dr. Edilia Boickson.  Again discussed with the patient and her daughter present May [facility] after [facility]ogram and then back to rehab.  Also discussed with Dr. Allena KatzPatel

## 2017-07-14 NOTE — Interval H&P Note (Signed)
History and Physical Interval Note:  07/14/2017 12:23 PM  [name] Petersen  has presented today for surgery, with the diagnosis of claudication  The various methods of treatment have been discussed with the patient and family. After consideration of risks, benefits and other options for treatment, the patient has consented to  Procedure(s): LOWER EXTREMITY ANGIOGRAPHY (N/A) as a surgical intervention .  The patient's history has been reviewed, patient examined, no change in status, stable for surgery.  I have reviewed the patient's chart and labs.  Questions were answered to the patient's satisfaction.     Waverly Ferrarihristopher Zarielle Cea

## 2017-07-14 NOTE — Progress Notes (Signed)
[facility] PHYSICAL MEDICINE & REHABILITATION     PROGRESS NOTE  Subjective/Complaints:  Patient seen lying in bed this morning. Daughter at bedside. She states she slept fairly overnight. She states she had some pain yesterday. Discussed with vascular surgery yesterday and today regarding arteriogram. Also discussed with nursing, patient may return to rehabilitation after procedure.  ROS: denies CP, SOB, nausea, vomiting, diarrhea.  Objective: Vital Signs: Blood pressure (!) 154/67, pulse 79, temperature 98.2 F (36.8 C), temperature source Oral, resp. rate 18, height 5\' 2"  (1.575 m), weight 76 kg (167 lb 8.8 oz), SpO2 97 %. Dg Abd 1 View  Result Date: 07/12/2017 CLINICAL DATA:  No bowel movement since amputation of the leg 1 weeks ago. There is abdominal distension. EXAM: ABDOMEN - 1 VIEW COMPARISON:  Lumbar spine series of June 10, 2017 FINDINGS: The stool burden is increased throughout the colon. There is no evidence of a small bowel obstruction or fecal impaction. There is a small amount of gas in the rectum. There is chronic levocurvature centered in the mid lumbar spine. There are degenerative changes of both hips. IMPRESSION: Increased colonic stool burden diffusely consistent with constipation. There is no evidence of ileus, obstruction, or fecal impaction. Electronically Signed   By: David  SwazilandJordan M.D.   On: 07/12/2017 09:14   Recent Labs    07/11/17 0928  WBC 8.2  HGB 13.3  HCT 38.6  PLT 223   Recent Labs    07/11/17 0928  NA 137  K 3.3*  CL 101  GLUCOSE 138*  BUN 28*  CREATININE 1.24*  CALCIUM 9.0   CBG (last 3)  Recent Labs    07/11/17 2124  GLUCAP 116*    Wt Readings from Last 3 Encounters:  07/11/17 76 kg (167 lb 8.8 oz)  07/06/17 76.2 kg (168 lb)  06/16/17 76.2 kg (168 lb)    Physical Exam:  BP (!) 154/67 (BP Location: Left Arm)   Pulse 79   Temp 98.2 F (36.8 C) (Oral)   Resp 18   Ht 5\' 2"  (1.575 m)   Wt 76 kg (167 lb 8.8 oz)   SpO2 97%    BMI 30.65 kg/m  Gen.: well-developed. Well-nourished. + Distressed HENT: Normocephalic. Atraumatic. Eyes: EOMI. No discharge.  Cardiovascular: RRR. No JVD. Respiratory: Clear. unlabored.  GI: Bowel sounds are normal. She exhibits no distension.  Musculoskeletal: Left stump edema and extremely tender to palpation with minimal touch, even well proximal to incision site Neurological.   Alert and oriented Follows simple commands Motor: Bilateral upper extremities: 4+/5 proximal to distal Right lower extremity: Hip flexion, knee extension 4-4+/5, ankle dorsiflexion 5/5  Left lower extremity: Hip flexion 4/5 (some pain inhibition, stable) Skin: Incision with staples C/D/I Right heel ulcer Psych: Flat.  Assessment/Plan: 1. Functional deficits secondary to left BKA which require 3+ hours per day of interdisciplinary therapy in a comprehensive inpatient rehab setting. Physiatrist is providing close team supervision and 24 hour management of active medical problems listed below. Physiatrist and rehab team continue to assess barriers to discharge/monitor patient progress toward functional and medical goals.  Function:  Bathing Bathing position   Position: Bed(LB at bed level, UB seated EOB)  Bathing parts Body parts bathed by patient: Right arm, Left arm, Chest, Abdomen, Front perineal area, Right upper leg, Left upper leg Body parts bathed by helper: Buttocks, Right lower leg, Back  Bathing assist Assist Level: Touching or steadying assistance(Pt > 75%)(Min assist)      Upper Body Dressing/Undressing Upper  body dressing   What is the patient wearing?: Pull over shirt/dress     Pull over shirt/dress - Perfomed by patient: Thread/unthread right sleeve, Thread/unthread left sleeve, Put head through opening, Pull shirt over trunk          Upper body assist Assist Level: Touching or steadying assistance(Pt > 75%)      Lower Body Dressing/Undressing Lower body dressing   What is the  patient wearing?: Pants, Non-skid slipper socks       Pants- Performed by helper: Thread/unthread right pants leg, Thread/unthread left pants leg, Pull pants up/down   Non-skid slipper socks- Performed by helper: Don/doff right sock                  Lower body assist Assist for lower body dressing: (Total assist)      Toileting Toileting Toileting activity did not occur: No continent bowel/bladder event   Toileting steps completed by helper: Adjust clothing prior to toileting, Performs perineal hygiene, Adjust clothing after toileting    Toileting assist Assist level: Two helpers   Transfers Chair/bed transfer Chair/bed transfer activity did not occur: Safety/medical concerns(pain, N and V) Chair/bed transfer method: Lateral scoot Chair/bed transfer assist level: Moderate assist (Pt 50 - 74%/lift or lower) Chair/bed transfer assistive device: Sliding board     Locomotion Ambulation Ambulation activity did not occur: Safety/medical concerns(pain, N and V)         Wheelchair Wheelchair activity did not occur: Safety/medical concerns(pain, N and V)        Cognition Comprehension Comprehension assist level: Follows basic conversation/direction with no assist  Expression Expression assist level: Expresses basic needs/ideas: With no assist  Social Interaction Social Interaction assist level: Interacts appropriately 75 - 89% of the time - Needs redirection for appropriate language or to initiate interaction.  Problem Solving Problem solving assist level: Solves basic 50 - 74% of the time/requires cueing 25 - 49% of the time  Memory Memory assist level: Recognizes or recalls 50 - 74% of the time/requires cueing 25 - 49% of the time    Medical Problem List and Plan: 1.  Decreased functional mobility secondary to left BKA secondary to peripheral vascular disease 07/06/2017   Continue CIR   Plantar arteriogram of right lower extremity per Vascular 2.  DVT  Prophylaxis/Anticoagulation: Subcutaneous heparin. Monitor for any bleeding episodes 3. Pain Management: Percocet changed to tramadol by Vascular   Patient will need a significant amount of encouragement related to poor coping mechanisms 4. Mood: Provide emotional support 5. Neuropsych: This patient is ?fully capable of making decisions on her own behalf. 6. Skin/Wound Care: Routine skin checks 7. Fluids/Electrolytes/Nutrition: Routine I/O's    Limited nutritional intake Remeron 7.5 started on 3/27, Should also improve with bowel movements   Encourage supplements 8.  CKD stage III.  Creatinine baseline 1.30-1.59.     Creatinine 1.24 on 3/25   Encourage fluids   Continue to monitor 9.  Hypertension.  Norvasc 10 mg daily, HCTZ 12.5 mg twice daily.      Monitor with increased mobility   Cozaar 50 started on 3/26   Remains elevated, will consider further medication after procedure 10.  History of CVA.  Continue Plavix. 11.  Diet controlled diabetes mellitus.  Hemoglobin A1c 6.0.      Will intermittently monitor with liberalized diet by vascular 12. Obesity   Body mass index is 30.65 kg/m.   Diet and exercise education   Encourage weight loss to increase endurance and promote overall  health 13. Hypokalemia   K+ 3.3 on 3/25   Labs ordered for tomorrow   Supplemented x2 days 14. Drug induced constipation   KUB reviewed, suggesting constipation   Bowel reg increased on 3/26 15. Sleep disturbance   Remeron initiated on 3/27   Melatonin 1.5mg  started on 3/27  LOS (Days) 4 A FACE TO FACE EVALUATION WAS PERFORMED  Jerzy Crotteau Karis Juba 07/14/2017 8:35 AM

## 2017-07-14 NOTE — Op Note (Signed)
   PATIENT: [name] Petersen      MRN: [id number] DOB: 03/06/1927    DATE OF PROCEDURE: 07/14/2017  INDICATIONS:    [name] Flavorsauline C Donalson is a 84 y.o. female who presents with a wound on her right heel.   PROCEDURE:    1.  Ultrasound-guided access to left common femoral artery 2.  Aortogram with bilateral iliac arteriogram 3.  Selective catheterization of the right external iliac artery with right lower extremity runoff 4.  Closure of left common femoral artery with Mynx device 5.  Conscious sedation  SURGEON: Di KindleChristopher S. Edilia Boickson, MD, FACS  ANESTHESIA: Local with sedation  EBL: Minimal  TECHNIQUE: The patient was brought to the peripheral vascular lab and was sedated. The period of conscious sedation was 45 minutes.  During that time period, I was present face-to-face 100% of the time.  The patient was administered 1 mg of Versed and 25 mcg of fentanyl. The patient's heart rate, blood pressure, and oxygen saturation were monitored by the nurse continuously during the procedure.  The left groin was prepped and draped in usual sterile fashion.  Under ultrasound guidance, after the skin was anesthetized, the left common femoral artery was cannulated with a micropuncture needle and a micropuncture sheath introduced over the wire.  This was exchanged for a 5 [place]French sheath over a Rosen wire.  The pigtail catheter was positioned at the L1 vertebral body and flush aortogram obtained.  The cath was positioned above the aortic bifurcation and an oblique iliac projection was obtained.  Next the pigtail catheter was exchanged for a crossover cath which was positioned into the right common iliac artery.  The Bentson wire was advanced down into the common femoral artery and the crossover catheter exchanged for a straight catheter.  Selective right external iliac arteriogram was obtained with right lower extremity runoff.  At the completion the left common femoral artery was closed using the Mynx device.  There  was some slight hematoma present therefore we held pressure for an additional 15 minutes.  FINDINGS:   1.  There are single renal arteries bilaterally with some mild right renal artery stenosis and approximately 60% left renal artery stenosis. 2.  The infrarenal aorta is widely patent. 3.  On the right side the common iliac artery has mild diffuse disease.  The hypogastric artery and external iliac artery on the right are patent.  The right common femoral, superficial femoral, deep femoral arteries are patent.  There is mild diffuse disease of the superficial femoral artery with moderate disease in the distal superficial femoral artery and popliteal artery.  There is single-vessel runoff on the right via the anterior tibial artery which has mild diffuse disease.  The peroneal artery reconstitutes distally.  The tibial peroneal trunk, proximal peroneal, and posterior tibial arteries are occluded.  Waverly Ferrarihristopher Satomi Buda, MD, FACS [facility]  DATE OF DICTATION:   07/14/2017

## 2017-07-14 NOTE — Progress Notes (Signed)
Pt. Was transfer to cath lab for procedure,when transfer pt. To the cath lab department, RN was informed by admissions that the pt. Will go to a different unit post cath lab.Charge nurse has been notified.

## 2017-07-14 NOTE — Progress Notes (Signed)
Physical Therapy Session Note  Patient Details  Name: [name] Petersen MRN: [id number] Date of Birth: 04/01/1927   PT Amount of Missed Time (min): 45 Minutes PT Missed Treatment Reason: Unavailable (Comment)   Pt off unit for procedure. Will follow up as able if pt cleared to return to inpatient rehab.   Karolee StampsGray, [name] Petersen  [name] Petersen, PT, DPT  07/14/2017, 1:12 PM

## 2017-07-14 NOTE — H&P (View-Only) (Signed)
Patient ID: [name] Petersen, female   DOB: 04/01/1927, 84 y.o.   MRN: [id number] Resting this morning. And for right leg arteriogram today with Dr. Dickson.  Again discussed with the patient and her daughter present May need to be transferred back to [facility] inpatient overnight after arteriogram and then back to rehab.  Also discussed with Dr. Patel 

## 2017-07-14 NOTE — Progress Notes (Signed)
Occupational Therapy Session Note  Patient Details  Name: [name] Petersen MRN: [id number] Date of Birth: 05/08/1926  Today's Date: 07/14/2017 OT Individual Time: 1100-1140 OT Individual Time Calculation (min): 40 min  Missed 20 mins   Short Term Goals: Week 1:  OT Short Term Goal 1 (Week 1): Pt will complete bathing with mod assist OT Short Term Goal 2 (Week 1): Pt will complete LB dressing with max assist of 1 caregiver OT Short Term Goal 3 (Week 1): Pt will complete toilet transfer with max assist of 1 caregiver OT Short Term Goal 4 (Week 1): Pt will remain OOB for 1 hour to increase activity tolerance/endurance  Skilled Therapeutic Interventions/Progress Updates:  Treatment session with focus on bed mobility and positioning.  Pt received supine in bed requiring increased time to fully arouse.  [name] present and report that she did not sleep well overnight.  Engaged in bed mobility as pt to be taken off unit for procedure shortly, therefore engaged in therapy at bed level.  Pt able to complete bed mobility with rolling Rt and Lt with min assist.  Educated on need to maintain ROM of LLE.  Engaged in knee extension and hip abduction/adduction at bed level with max cues for technique as pt tends to keep knee bent requiring tactile cues to promote knee extension.  Provided daughters with "First Steps" publication and directed them towards pertinent information.  Discussed hands out given to other daughter yesterday.  Rn arrived to cath pt during session and prep pt for procedure.  Pt missed 20 mins of therapy session due to prep for procedure and then transport off unit.  Therapy Documentation Precautions:  Precautions Precautions: Fall Restrictions Weight Bearing Restrictions: Yes LLE Weight Bearing: Non weight bearing General: General OT Amount of Missed Time: 20 Minutes Vital Signs: Therapy Vitals Temp: 98.3 F (36.8 C) Temp Source: Oral Pulse Rate: 77 Resp: 17 BP: (!)  178/73 Patient Position (if appropriate): Lying Oxygen Therapy SpO2: 99 % O2 Device: Room Air Pain:   Pt with no c/o pain  See Function Navigator for Current Functional Status.   Therapy/Group: Individual Therapy  Rosalio LoudHOXIE, Stephanie Littman 07/14/2017, 12:15 PM

## 2017-07-14 NOTE — Plan of Care (Signed)
  Problem: Education: Goal: Knowledge of General Education information will improve Outcome: Progressing   Problem: Elimination: Goal: Will not experience complications related to bowel motility Outcome: Not Progressing

## 2017-07-14 NOTE — Patient Care Conference (Signed)
Inpatient RehabilitationTeam Conference and Plan of Care Update Date: 07/13/2017   Time: 2:50 PM    Patient Name: [name] Petersen      Medical Record Number: [id number]  Date of Birth: April 30, 1926 Sex: Female         Room/Bed: 4M07C/4M07C-01 Payor Info: Payor: MEDICARE / Plan: MEDICARE PART A AND B / Product Type: *No Product type* /    Admitting Diagnosis: L BKA   Admit Date/Time:  07/10/2017  4:44 PM Admission Comments: No comment available   Primary Diagnosis:  <principal problem not specified> Principal Problem: <principal problem not specified>  Patient Active Problem List   Diagnosis Date Noted  . Atherosclerotic PVD with ulceration (HCC) 07/14/2017  . Pressure injury of right heel, stage 3 (HCC)   . Sleep disturbance   . Poor nutrition   . Hypokalemia   . Drug-induced constipation   . Essential hypertension   . Stage 3 chronic kidney disease (HCC)   . Postoperative pain   . Diabetes mellitus type 2 in obese (HCC)   . S/P below knee amputation, left (HCC) 07/10/2017  . PAD (peripheral artery disease) (HCC) 07/06/2017  . Atherosclerosis of native arteries of the extremities with ulceration (HCC) 06/16/2017    Expected Discharge Date: Expected Discharge Date: 07/26/17  Team Members Present: Physician leading conference: Dr. Maryla Morrow Social Worker Present: Amada Jupiter, LCSW Nurse Present: Chrissie Noa, RN PT Present: Grier Rocher, PT;Karen Donaworth, PT;Rosita Dechalus, PTA OT Present: Rosalio Loud, OT SLP Present: Jackalyn Lombard, SLP PPS Coordinator present : Tora Duck, RN, CRRN     Current Status/Progress Goal Weekly Team Focus  Medical   Decreased functional mobility secondary to left BKA secondary to peripheral vascular disease 07/06/2017  Improve mobility, safety, transfers, pain, BP/DM, consitpation, hypokalemia, sleep  See above   Bowel/Bladder   Incotinent of Bladder and Bowel, LBM 3/26   Continence of Bladder /Bowel  Assess for urinary and bowel issues QS,  adminster laxative prophylacticas orderedwithi2-3 days    Swallow/Nutrition/ Hydration             ADL's   max-total assist bathing/dressing at bed level, max-total assist slide board transfers, +2 for toileting   min assist overall  ADL retraining, transfers, activity tolerance/endurance, pt/family education   Mobility   maxA SB transfer, maxA sit to stand from elevated mat, mod/maxA bed mobility with use of features, minA w/c mobility   S bed, min assist all transfer, S w/c; no gait  bed mobility, sitting balance, SB transfers, endurance, d/c planning   Communication             Safety/Cognition/ Behavioral Observations  Alert oriented cooperative, able to make her needs know no reported falls or injuries  No falls or injuries while in  Rehab  Assess and educate patient, families on safety protocol and policy and monitor for safety concerns QS and prn   Pain   Tramadol Q12 hrs admiistered for surgical wound left BKA and generalized pain rate pain 8/10  < 3  Assess and address pain QS and reassess effectiveness of medication or alternate methods of treatment   Skin   S/P surgical wound to Left stump area,      Assess skin and surgical area QS and document outcomes     Rehab Goals Patient on target to meet rehab goals: Yes *See Care Plan and progress notes for long and short-term goals.     Barriers to Discharge  Current Status/Progress Possible Resolutions Date Resolved  Physician    Medical stability;Weight bearing restrictions     See above  Therapies, optimize pain meds, follow labs, otpimize DM/BP, bowel, sleep meds, supplement K+      Nursing                  PT                    OT Incontinence                SLP                SW                Discharge Planning/Teaching Needs:  home with family to coordinate 24/7 assistance      Team Discussion:  MD addressing bowel issues/ nausea.  Currently mod-max assist with PT and ot and goals being set up withfor  supervision - min assist overall.  Anticipate mostly w/c level goals.  Revisions to Treatment Plan:  None    Continued Need for Acute Rehabilitation Level of Care: The patient requires daily medical management by a physician with specialized training in physical medicine and rehabilitation for the following conditions: Daily direction of a multidisciplinary physical rehabilitation program to ensure safe treatment while eliciting the highest outcome that is of practical value to the patient.: Yes Daily medical management of patient stability for increased activity during participation in an intensive rehabilitation regime.: Yes Daily analysis of laboratory values and/or radiology reports with any subsequent need for medication adjustment of medical intervention for : Urological problems;Other;Renal problems;Diabetes problems;Blood pressure problems;Post surgical problems;Nutritional problems  Anina Schnake 07/14/2017, 4:15 PM

## 2017-07-14 NOTE — Progress Notes (Signed)
Physical Therapy Session Note  Patient Details  Name: DEANZA UPPERMAN MRN: [id number] Date of Birth: [birth year]  Today's Date: 07/14/2017 PT Individual Time: 0905-1000 PT Individual Time Calculation (min): 55 min   Short Term Goals: Week 1:  PT Short Term Goal 1 (Week 1): pt will roll L >< R with mod assist PT Short Term Goal 2 (Week 1): pt will move supine> sit with mod assist PT Short Term Goal 3 (Week 1): pt will transfer w/c>< bed wiht assist of 1 person PT Short Term Goal 4 (Week 1): pt will propel w/c x 50' with min assist PT Short Term Goal 5 (Week 1): pt will state 3 components of basic transfer  Skilled Therapeutic Interventions/Progress Updates:   Pt received supine in bed and agreeable to PT. Supine>sit transfer with min assist at trunk and moderate cues for use of bed rails and safety with LE management.   Sitting balance EOB with supervision assist from PT while LLE re-wrapped. Intermittent L lateral LOB with delay respose to corrcet. PT instructed pt in lower body dressing with Lateral lean technique and mod assist to pull pants to waist.   SB transfer to [facility] with min assist from PT. Moderate verbal cues for proper set up, use of BUE and RLE as well as anterior weight shift.   WC mobility instructed by PT x 115 with supervision assist and cues for improved stroke length to minimize energy expenditure.   Sit<>stand transfer training with min-mod assist from PT in parallel bars x 5 sustained standing tolerance 30sec progressing to 1 min. Pt required to block the R knee on first 2 attempts, tactile cues for knee extension on last 3 attempts.   PT instructed pt in initiation of gait training in parallel bars. Pt able to take 2 steps with mod assist and moderate multimodal cues from PT.   BUE/BLE strengthening to perform press up in WC x 10 with 3 second hold. Min assist from PT.   Patient returned to room and left sitting in [facility] with call bell in reach and all needs met.            Therapy Documentation Precautions:  Precautions Precautions: Fall Restrictions Weight Bearing Restrictions: Yes LLE Weight Bearing: Non weight bearing Pain: Pain Assessment Pain Scale: 0-10 Pain Score: 4  Pain Type: Surgical pain Pain Location: Leg Pain Orientation: Left Pain Descriptors / Indicators: Aching Pain Frequency: Intermittent Pain Onset: Gradual Patients Stated Pain Goal: 2 Pain Intervention(s): Medication (See eMAR)   See Function Navigator for Current Functional Status.   Therapy/Group: Individual Therapy  Lorie Phenix 07/14/2017, 9:59 AM

## 2017-07-15 ENCOUNTER — Encounter (HOSPITAL_COMMUNITY): Payer: Self-pay | Admitting: Vascular Surgery

## 2017-07-15 ENCOUNTER — Other Ambulatory Visit: Payer: Self-pay

## 2017-07-15 ENCOUNTER — Inpatient Hospital Stay (HOSPITAL_COMMUNITY): Payer: Medicare Other | Admitting: Occupational Therapy

## 2017-07-15 ENCOUNTER — Inpatient Hospital Stay (HOSPITAL_COMMUNITY): Payer: Medicare Other

## 2017-07-15 ENCOUNTER — Inpatient Hospital Stay (HOSPITAL_REHABILITATION)
Admission: RE | Admit: 2017-07-15 | Discharge: 2017-07-29 | Disposition: A | Discharge status: Home / Self Care | Payer: Medicare Other | Source: Intra-hospital | Attending: Physical Medicine & Rehabilitation | Admitting: Physical Medicine & Rehabilitation

## 2017-07-15 DIAGNOSIS — I70209 Unspecified atherosclerosis of native arteries of extremities, unspecified extremity: Secondary | ICD-10-CM

## 2017-07-15 DIAGNOSIS — I739 Peripheral vascular disease, unspecified: Secondary | ICD-10-CM | POA: Diagnosis present

## 2017-07-15 DIAGNOSIS — E1169 Type 2 diabetes mellitus with other specified complication: Secondary | ICD-10-CM | POA: Diagnosis present

## 2017-07-15 DIAGNOSIS — S88112S Complete traumatic amputation at level between knee and ankle, left lower leg, sequela: Secondary | ICD-10-CM

## 2017-07-15 DIAGNOSIS — E119 Type 2 diabetes mellitus without complications: Secondary | ICD-10-CM | POA: Diagnosis present

## 2017-07-15 DIAGNOSIS — N183 Chronic kidney disease, stage 3 unspecified: Secondary | ICD-10-CM | POA: Diagnosis present

## 2017-07-15 DIAGNOSIS — K5901 Slow transit constipation: Secondary | ICD-10-CM

## 2017-07-15 DIAGNOSIS — G546 Phantom limb syndrome with pain: Secondary | ICD-10-CM

## 2017-07-15 DIAGNOSIS — N390 Urinary tract infection, site not specified: Secondary | ICD-10-CM

## 2017-07-15 DIAGNOSIS — I10 Essential (primary) hypertension: Secondary | ICD-10-CM | POA: Diagnosis present

## 2017-07-15 DIAGNOSIS — E669 Obesity, unspecified: Secondary | ICD-10-CM

## 2017-07-15 DIAGNOSIS — S88112A Complete traumatic amputation at level between knee and ankle, left lower leg, initial encounter: Secondary | ICD-10-CM | POA: Diagnosis present

## 2017-07-15 DIAGNOSIS — R339 Retention of urine, unspecified: Secondary | ICD-10-CM

## 2017-07-15 HISTORY — DX: Unspecified atherosclerosis of native arteries of extremities, unspecified extremity: I70.209

## 2017-07-15 LAB — CREATININE, SERUM
Creatinine, Ser: 1.33 mg/dL — ABNORMAL HIGH (ref 0.44–1.00)
GFR calc Af Amer: 40 mL/min — ABNORMAL LOW (ref >60)
GFR calc non Af Amer: 35 mL/min — ABNORMAL LOW (ref >60)

## 2017-07-15 LAB — URINALYSIS, COMPLETE (UACMP) WITH MICROSCOPIC
Bacteria, UA: NONE SEEN
Bilirubin Urine: NEGATIVE
Glucose, UA: NEGATIVE mg/dL
Ketones, ur: NEGATIVE mg/dL
Nitrite: NEGATIVE
Protein, ur: 100 mg/dL — AB
Specific Gravity, Urine: 1.019 (ref 1.005–1.030)
pH: 8 (ref 5.0–8.0)

## 2017-07-15 LAB — CBC
HCT: 34.8 % — ABNORMAL LOW (ref 36.0–46.0)
Hemoglobin: 11 g/dL — ABNORMAL LOW (ref 12.0–15.0)
MCH: 28 pg (ref 26.0–34.0)
MCHC: 31.6 g/dL (ref 30.0–36.0)
MCV: 88.5 fL (ref 78.0–100.0)
Platelets: 268 10*3/uL (ref 150–400)
RBC: 3.93 MIL/uL (ref 3.87–5.11)
RDW: 13.9 % (ref 11.5–15.5)
WBC: 17.7 10*3/uL — ABNORMAL HIGH (ref 4.0–10.5)

## 2017-07-15 MED ORDER — AMLODIPINE BESYLATE 10 MG PO TABS
10.0000 mg | ORAL_TABLET | Freq: Every day | ORAL | Status: DC
  Administered 2017-07-16 – 2017-07-29 (×14): 10 mg via ORAL
  Filled 2017-07-15 (×14): qty 1

## 2017-07-15 MED ORDER — ACETAMINOPHEN 325 MG PO TABS: 325.0000 mg | ORAL_TABLET | Freq: Four times a day (QID) | ORAL | Status: DC | PRN

## 2017-07-15 MED ORDER — SENNOSIDES-DOCUSATE SODIUM 8.6-50 MG PO TABS
2.0000 | ORAL_TABLET | Freq: Every day | ORAL | Status: DC
  Administered 2017-07-15 – 2017-07-28 (×9): 2 via ORAL
  Filled 2017-07-15 (×12): qty 2

## 2017-07-15 MED ORDER — PROCHLORPERAZINE MALEATE 5 MG PO TABS: 5.0000 mg | ORAL_TABLET | Freq: Four times a day (QID) | ORAL | Status: DC | PRN

## 2017-07-15 MED ORDER — FLEET ENEMA 7-19 GM/118ML RE ENEM: 1.0000 | ENEMA | Freq: Once | RECTAL | Status: DC | PRN

## 2017-07-15 MED ORDER — MIRTAZAPINE 15 MG PO TABS
7.5000 mg | ORAL_TABLET | Freq: Every day | ORAL | Status: DC
  Administered 2017-07-15 – 2017-07-26 (×12): 7.5 mg via ORAL
  Filled 2017-07-15 (×13): qty 1

## 2017-07-15 MED ORDER — HEPARIN SODIUM (PORCINE) 5000 UNIT/ML IJ SOLN
5000.0000 [IU] | Freq: Three times a day (TID) | INTRAMUSCULAR | Status: DC
  Administered 2017-07-15 – 2017-07-19 (×11): 5000 [IU] via SUBCUTANEOUS
  Filled 2017-07-15 (×11): qty 1

## 2017-07-15 MED ORDER — POLYETHYLENE GLYCOL 3350 17 G PO PACK
17.0000 g | PACK | Freq: Every day | ORAL | Status: DC | PRN
  Filled 2017-07-15: qty 1

## 2017-07-15 MED ORDER — LOSARTAN POTASSIUM 50 MG PO TABS
50.0000 mg | ORAL_TABLET | Freq: Every day | ORAL | Status: DC
  Administered 2017-07-15 – 2017-07-29 (×15): 50 mg via ORAL
  Filled 2017-07-15 (×15): qty 1

## 2017-07-15 MED ORDER — DIPHENHYDRAMINE HCL 12.5 MG/5ML PO ELIX: 12.5000 mg | ORAL_SOLUTION | Freq: Four times a day (QID) | ORAL | Status: DC | PRN

## 2017-07-15 MED ORDER — ACETAMINOPHEN 325 MG PO TABS
325.0000 mg | ORAL_TABLET | ORAL | Status: DC | PRN
  Administered 2017-07-17 – 2017-07-28 (×4): 650 mg via ORAL
  Filled 2017-07-15 (×4): qty 2

## 2017-07-15 MED ORDER — GUAIFENESIN-DM 100-10 MG/5ML PO SYRP: 5.0000 mL | ORAL_SOLUTION | Freq: Four times a day (QID) | ORAL | Status: DC | PRN

## 2017-07-15 MED ORDER — ALUM & MAG HYDROXIDE-SIMETH 200-200-20 MG/5ML PO SUSP: 30.0000 mL | ORAL | Status: DC | PRN

## 2017-07-15 MED ORDER — ADULT MULTIVITAMIN W/MINERALS CH
1.0000 | ORAL_TABLET | Freq: Every day | ORAL | Status: DC
  Administered 2017-07-16: 1 via ORAL
  Filled 2017-07-15 (×2): qty 1

## 2017-07-15 MED ORDER — BISACODYL 10 MG RE SUPP
10.0000 mg | Freq: Every day | RECTAL | Status: DC | PRN
  Administered 2017-07-16 – 2017-07-19 (×2): 10 mg via RECTAL
  Filled 2017-07-15 (×2): qty 1

## 2017-07-15 MED ORDER — METHOCARBAMOL 500 MG PO TABS
500.0000 mg | ORAL_TABLET | Freq: Four times a day (QID) | ORAL | Status: DC | PRN
  Administered 2017-07-15 – 2017-07-27 (×12): 500 mg via ORAL
  Filled 2017-07-15 (×14): qty 1

## 2017-07-15 MED ORDER — TRAMADOL HCL 50 MG PO TABS
50.0000 mg | ORAL_TABLET | Freq: Three times a day (TID) | ORAL | Status: DC | PRN
  Administered 2017-07-16 (×2): 50 mg via ORAL
  Administered 2017-07-17 – 2017-07-19 (×5): 100 mg via ORAL
  Administered 2017-07-19: 50 mg via ORAL
  Administered 2017-07-20 – 2017-07-21 (×4): 100 mg via ORAL
  Administered 2017-07-22: 50 mg via ORAL
  Administered 2017-07-22 – 2017-07-23 (×2): 100 mg via ORAL
  Administered 2017-07-23 – 2017-07-24 (×2): 50 mg via ORAL
  Administered 2017-07-25 – 2017-07-28 (×5): 100 mg via ORAL
  Filled 2017-07-15 (×6): qty 2
  Filled 2017-07-15 (×3): qty 1
  Filled 2017-07-15 (×3): qty 2
  Filled 2017-07-15 (×2): qty 1
  Filled 2017-07-15: qty 2
  Filled 2017-07-15: qty 1
  Filled 2017-07-15: qty 2
  Filled 2017-07-15: qty 1
  Filled 2017-07-15 (×2): qty 2
  Filled 2017-07-15: qty 1
  Filled 2017-07-15: qty 2
  Filled 2017-07-15: qty 1
  Filled 2017-07-15: qty 2

## 2017-07-15 MED ORDER — PROCHLORPERAZINE 25 MG RE SUPP: 12.5000 mg | Freq: Four times a day (QID) | RECTAL | Status: DC | PRN

## 2017-07-15 MED ORDER — PROCHLORPERAZINE EDISYLATE 5 MG/ML IJ SOLN: 5.0000 mg | Freq: Four times a day (QID) | INTRAMUSCULAR | Status: DC | PRN

## 2017-07-15 MED ORDER — TRAZODONE HCL 50 MG PO TABS
25.0000 mg | ORAL_TABLET | Freq: Every evening | ORAL | Status: DC | PRN
  Administered 2017-07-15 – 2017-07-25 (×4): 50 mg via ORAL
  Filled 2017-07-15 (×4): qty 1

## 2017-07-15 MED ORDER — TRAMADOL HCL 50 MG PO TABS
100.0000 mg | ORAL_TABLET | Freq: Three times a day (TID) | ORAL | Status: DC | PRN
  Administered 2017-07-15: 14:00:00 100 mg via ORAL
  Filled 2017-07-15: qty 2

## 2017-07-15 MED ORDER — OXYCODONE-ACETAMINOPHEN 5-325 MG PO TABS
1.0000 | ORAL_TABLET | Freq: Four times a day (QID) | ORAL | Status: DC | PRN
Start: 2017-07-15 — End: 2017-07-29
  Administered 2017-07-15 – 2017-07-29 (×32): 1 via ORAL
  Filled 2017-07-15 (×32): qty 1

## 2017-07-15 MED ORDER — TRAMADOL HCL 50 MG PO TABS: 100.0000 mg | ORAL_TABLET | Freq: Three times a day (TID) | ORAL | Status: DC | PRN

## 2017-07-15 MED ORDER — CLOPIDOGREL BISULFATE 75 MG PO TABS
75.0000 mg | ORAL_TABLET | Freq: Every day | ORAL | Status: DC
  Administered 2017-07-16 – 2017-07-29 (×14): 75 mg via ORAL
  Filled 2017-07-15 (×14): qty 1

## 2017-07-15 MED FILL — Lidocaine HCl Local Inj 1%: INTRAMUSCULAR | Qty: 20 | Status: AC

## 2017-07-15 MED FILL — Heparin Sodium (Porcine) 2 Unit/ML in Sodium Chloride 0.9%: INTRAMUSCULAR | Qty: 1000 | Status: AC

## 2017-07-15 NOTE — Progress Notes (Signed)
Pt's daughter is verbally aggressive & complaining that nobody was giving water to the pt the whole night. Pt's daughter was told that pt was given ginger ale  by another RN  last night then after 11pm  I  brought 2 cups of water which is visible at  bedside table. As per pt stated " I don't want to drink because it makes me pee all the time". Advised & encouraged pt to drink a lot of fluids to avoid dehydration. Peri care done multiple times ; bed pads changed. Silk gown changed to cotton gown as per pt request. Given another cup of water & orange juice.

## 2017-07-15 NOTE — Progress Notes (Signed)
Rehab admissions - Patient went off of inpatient rehab yesterday for arteriogram.  Patient stayed on a telemetry post cath recovery unit over night for monitoring.  She has been cleared for re-admit to acute inpatient rehab.  Bed available and will admit to inpatient rehab today.  This is an interrupted stay.  Call me for questions.  #[phone]

## 2017-07-15 NOTE — Discharge Summary (Signed)
Discharge summary job # [id number]

## 2017-07-15 NOTE — Discharge Summary (Signed)
Discharge Summary    [name] Petersen 25-Sep-1926 84 y.o. female  [id number]  Admission Date: 07/14/2017  Discharge Date: 07/15/17  Physician: Chuck Hint, *  Admission Diagnosis: Atherosclerotic PVD with ulceration (HCC) [I70.209, L98.499]   HPI:   This is a 84 y.o. female who is  Resting this morning. And for right leg arteriogram today with Dr. Edilia Bo.  Again discussed with the patient and her daughter present May need to be transferred back to [facility] inpatient overnight after arteriogram and then back to rehab.  Also discussed with Dr. Allena Katz           The pt was to undergo arteriogram to look at RLE runoff due to new right heel ulcer.  Hospital Course:  The patient was admitted to the hospital and taken to the [facility] lab on 07/14/2017 and underwent: 1.  Ultrasound-guided access to left common femoral artery 2.  Aortogram with bilateral iliac arteriogram 3.  Selective catheterization of the right external iliac artery with right lower extremity runoff 4.  Closure of left common femoral artery with Mynx device 5.  Conscious sedation  Findings: 1.  There are single renal arteries bilaterally with some mild right renal artery stenosis and approximately 60% left renal artery stenosis. 2.  The infrarenal aorta is widely patent. 3.  On the right side the common iliac artery has mild diffuse disease.  The hypogastric artery and external iliac artery on the right are patent.  The right common femoral, superficial femoral, deep femoral arteries are patent.  There is mild diffuse disease of the superficial femoral artery with moderate disease in the distal superficial femoral artery and popliteal artery.  There is single-vessel runoff on the right via the anterior tibial artery which has mild diffuse disease.  The peroneal artery reconstitutes distally.  The tibial peroneal trunk, proximal peroneal, and posterior tibial arteries are occluded   The pt tolerated the procedure  well and was transported to the PACU in good condition.   By the next morning she was doing well.  Her left BKA stump is healing.  There is moderate ecchymosis in the left groin.  Heel wound is essentially unchanged from earlier in the week.  Encouraged pt and family to keep Pravalon boot on at all times when not ambulating to keep pressure off of her heel.  Also encouraged pt to eat a regular diet to promote heeling.    The remainder of the hospital course consisted of increasing mobilization and increasing intake of solids without difficulty.  CBC    Component Value Date/Time   WBC 8.2 07/11/2017 0928   RBC 4.45 07/11/2017 0928   HGB 12.2 07/14/2017 1251   HCT 36.0 07/14/2017 1251   PLT 223 07/11/2017 0928   MCV 86.7 07/11/2017 0928   MCH 29.9 07/11/2017 0928   MCHC 34.5 07/11/2017 0928   RDW 14.0 07/11/2017 0928   LYMPHSABS 1.0 07/11/2017 0928   MONOABS 1.2 (H) 07/11/2017 0928   EOSABS 0.3 07/11/2017 0928   BASOSABS 0.0 07/11/2017 0928    BMET    Component Value Date/Time   NA 137 07/14/2017 1251   K 3.7 07/14/2017 1251   CL 96 (L) 07/14/2017 1251   CO2 25 07/11/2017 0928   GLUCOSE 94 07/14/2017 1251   BUN 18 07/14/2017 1251   CREATININE 1.00 07/14/2017 1251   CALCIUM 9.0 07/11/2017 0928   GFRNONAA 38 (L) 07/11/2017 0928   GFRAA 44 (L) 07/11/2017 0928        Discharge  Diagnosis:  Atherosclerotic PVD with ulceration (HCC) [I70.209, L98.499]  Secondary Diagnosis: Patient Active Problem List   Diagnosis Date Noted  . Atherosclerotic PVD with ulceration (HCC) 07/14/2017  . Pressure injury of right heel, stage 3 (HCC)   . Sleep disturbance   . Poor nutrition   . Hypokalemia   . Drug-induced constipation   . Essential hypertension   . Stage 3 chronic kidney disease (HCC)   . Postoperative pain   . Diabetes mellitus type 2 in obese (HCC)   . S/P below knee amputation, left (HCC) 07/10/2017  . PAD (peripheral artery disease) (HCC) 07/06/2017  . Atherosclerosis  of native arteries of the extremities with ulceration (HCC) 06/16/2017   Past Medical History:  Diagnosis Date  . Arthritis    hands  . Atherosclerotic PVD with ulceration (HCC) 07/15/2017   RIGHT HEEL  . Complication of anesthesia    lost eyesight for a few days after surgery in the 1950's  . Constipation   . Diabetes mellitus without complication (HCC)    no medications   . Hypertension   . Macular degeneration, bilateral    "one eye wet; one eye dry; she gets shots" (07/07/2017)  . PAD (peripheral artery disease) (HCC)   . Pneumonia   . Stroke [facility](HCC)    some trouble swallowing     Allergies as of 07/15/2017      Reactions   Scopace [scopolamine] Other (See Comments)   Hallucinations      Medication List    STOP taking these medications   oxyCODONE-acetaminophen 5-325 MG tablet Commonly known as:  PERCOCET/ROXICET     TAKE these medications   acetaminophen 325 MG tablet Commonly known as:  TYLENOL Take 325-650 mg by mouth every 6 (six) hours as needed for moderate pain or headache.   amLODipine 10 MG tablet Commonly known as:  NORVASC Take 10 mg by mouth daily.   beta carotene w/minerals tablet Take 1 tablet by mouth daily.   clopidogrel 75 MG tablet Commonly known as:  PLAVIX Take 75 mg by mouth daily.   traMADol 50 MG tablet Commonly known as:  ULTRAM Take 2 tablets (100 mg total) by mouth every 8 (eight) hours as needed for moderate pain.       Prescriptions given: none  Instructions: 1.  Pravalon boot to right foot at all times when not ambulating 2.  Kerlix and ace wrap to left BKA daily.  3.  Please place pt on a regular diet.  Disposition: CIR  Patient's condition: is Good  Follow up: 1. Dr. Arbie CookeyEarly on 08/09/17   Doreatha MassedSamantha Jylian Pappalardo, PA-C [facility] [phone] 07/15/2017  10:39 AM

## 2017-07-15 NOTE — Progress Notes (Signed)
[facility] PHYSICAL MEDICINE & REHABILITATION     PROGRESS NOTE  Subjective/Complaints:  Pt sleepy. Was up a good bit the night before. Pain levels under fair control. Grandaughter at bedside.   ROS: Patient denies fever, rash, sore throat, blurred vision, nausea, vomiting, diarrhea, cough, shortness of breath or chest pain, joint or back pain, headache, or mood change.   Objective: Vital Signs: Blood pressure (!) 154/69, pulse 100, temperature 98.5 F (36.9 C), temperature source Oral, resp. rate 16, SpO2 100 %. No results found. Recent Labs    07/14/17 1251  HGB 12.2  HCT 36.0   Recent Labs    07/14/17 1251  NA 137  K 3.7  CL 96*  GLUCOSE 94  BUN 18  CREATININE 1.00   CBG (last 3)  Recent Labs    07/14/17 1435  GLUCAP 85    Wt Readings from Last 3 Encounters:  07/15/17 71.3 kg (157 lb 3 oz)  07/11/17 76 kg (167 lb 8.8 oz)  07/06/17 76.2 kg (168 lb)    Physical Exam:  BP (!) 154/69 (BP Location: Right Arm)   Pulse 100   Temp 98.5 F (36.9 C) (Oral)   Resp 16   SpO2 100%  Constitutional: No distress . Vital signs reviewed. Eyes closed HEENT: EOMI, oral membranes moist Cardiovascular: RRR without murmur. No JVD    Respiratory: CTA Bilaterally without wheezes or rales. Normal effort    GI: BS +, non-tender, non-distended  Musculoskeletal: Left stump edema. Left BKA and right foot sensitive to touch. Neurological.   Slow to arouse Follows simple commands Motor: Bilateral upper extremities: 4+/5 proximal to distal Right lower extremity: Hip flexion, knee extension 4-4+/5, ankle dorsiflexion 5/5  Left lower extremity: Hip flexion 3-4/5 Skin: Incision with staples C/D/I Right heel ulcer just behind lateral malleolus,  stage II, about 2-3cm in diameter Psych: Flat but cooperates when awake.   Assessment/Plan: 1. Functional deficits secondary to left BKA which require 3+ hours per day of interdisciplinary therapy in a comprehensive inpatient rehab  setting. Physiatrist is providing close team supervision and 24 hour management of active medical problems listed below. Physiatrist and rehab team continue to assess barriers to discharge/monitor patient progress toward functional and medical goals.  Function:  Bathing Bathing position      Bathing parts      Bathing assist        Upper Body Dressing/Undressing Upper body dressing                    Upper body assist        Lower Body Dressing/Undressing Lower body dressing                                  Lower body assist        Toileting Toileting          Toileting assist     Transfers Chair/bed transfer             Locomotion Ambulation           Wheelchair          Cognition Comprehension    Expression    Social Interaction    Problem Solving    Memory      Medical Problem List and Plan: 1.  Decreased functional mobility secondary to left BKA secondary to peripheral vascular disease 07/06/2017. Pt s/p arteriogram, transferred off unit post-procedure.  Now back to rehab.    -resuming therapies, interrupted stay 2.  DVT Prophylaxis/Anticoagulation: Subcutaneous heparin. Monitor for any bleeding episodes 3. Pain Management: using tramadol given poor opioid tolerance.  4. Mood: Provide emotional support 5. Neuropsych: This patient is not consistently capable of making decisions on her own behalf. 6. Skin/Wound Care: Routine skin checks 7. Fluids/Electrolytes/Nutrition: Routine I/O's    Resume remeron for sleep/appetite 8.  CKD stage III.  Creatinine baseline 1.30-1.59.     Creatinine 1.24 on 3/25   Encourage fluids   Continue to monitor 9.  Hypertension.  Norvasc 10 mg daily, HCTZ 12.5 mg twice daily.      Monitor with increased mobility   Cozaar 50 started on 3/26 but not continued?---resume today   Titrate regimen further as needed.  10.  History of CVA.  Continue Plavix. 11.  Diet controlled diabetes mellitus.   Hemoglobin A1c 6.0.      -monitor CBG's 12. Obesity   There is no height or weight on file to calculate BMI.   Diet and exercise education   Encourage weight loss to increase endurance and promote overall health 13. Hypokalemia   K+ 3.3 on 3/25   Labs ordered for tomorrow   Supplemented x2 days 14. Drug induced constipation   KUB reviewed, suggesting constipation   Bowel reg increased on 3/26 15. Sleep disturbance   Remeron initiated on 3/27   Melatonin 1.5mg  started on 3/27  LOS (Days) 0 A FACE TO FACE EVALUATION WAS PERFORMED  Ranelle Oyster 07/15/2017 4:15 PM

## 2017-07-15 NOTE — Discharge Instructions (Signed)
Please place pt on REGULAR DIET.  Thanks!  Please keep Pravalon boot on right foot at all times when not ambulating to help prevent right heel ulcer from worsening.  Thanks!

## 2017-07-15 NOTE — Progress Notes (Signed)
Pt arrived to approx 1530 with family. Pt was transferred to cath lab yesterday and kept over night for observation. Pt in room 4MW07.

## 2017-07-15 NOTE — Progress Notes (Addendum)
  Progress Note    07/15/2017 10:43 AM   Subjective:  No complaints; pt;s family upset that boot is not always on the pt.  Also would like regular diet.   Vitals:   07/15/17 0403 07/15/17 0700  BP: (!) 166/71 (!) 170/74  Pulse: 77 90  Resp: (!) 26 (!) 34  Temp: 98.2 F (36.8 C) 97.8 F (36.6 C)  SpO2: 99% 99%    Physical Exam: Incisions:  Left BKA stump is healing with staples in tact.  Left groin with moderate ecchymosis. Right heel blister ~ quarter size and unchanged from earlier in the week.   CBC    Component Value Date/Time   WBC 8.2 07/11/2017 0928   RBC 4.45 07/11/2017 0928   HGB 12.2 07/14/2017 1251   HCT 36.0 07/14/2017 1251   PLT 223 07/11/2017 0928   MCV 86.7 07/11/2017 0928   MCH 29.9 07/11/2017 0928   MCHC 34.5 07/11/2017 0928   RDW 14.0 07/11/2017 0928   LYMPHSABS 1.0 07/11/2017 0928   MONOABS 1.2 (H) 07/11/2017 0928   EOSABS 0.3 07/11/2017 0928   BASOSABS 0.0 07/11/2017 0928    BMET    Component Value Date/Time   NA 137 07/14/2017 1251   K 3.7 07/14/2017 1251   CL 96 (L) 07/14/2017 1251   CO2 25 07/11/2017 0928   GLUCOSE 94 07/14/2017 1251   BUN 18 07/14/2017 1251   CREATININE 1.00 07/14/2017 1251   CALCIUM 9.0 07/11/2017 0928   GFRNONAA 38 (L) 07/11/2017 0928   GFRAA 44 (L) 07/11/2017 0928    INR No results found for: INR   Intake/Output Summary (Last 24 hours) at 07/15/2017 1043 Last data filed at 07/15/2017 0830 Gross per 24 hour  Intake 480 ml  Output -  Net 480 ml     Assessment/Plan:  84 y.o. female is s/p left below knee amputation  And arteriogram to evaluate RLE runoff for right heel blister  -pt resting comfortably-stressed to pt and family that she must wear the pravalon boot at all times except when ambulating.   -pain has improved in left BKA and tolerating dressing change much better -there is moderate ecchymosis in the left groin that is not unexpected. -changed diet to regular diet to promote healing. -okay to  transfer back to CIR today-please place on regular diet.   Doreatha MassedSamantha Rhyne, PA-C [facility] [phone] 07/15/2017 10:43 AM

## 2017-07-16 ENCOUNTER — Inpatient Hospital Stay (HOSPITAL_COMMUNITY): Payer: Medicare Other | Admitting: Physical Therapy

## 2017-07-16 ENCOUNTER — Inpatient Hospital Stay (HOSPITAL_COMMUNITY): Payer: Medicare Other

## 2017-07-16 ENCOUNTER — Encounter (HOSPITAL_COMMUNITY): Payer: Self-pay

## 2017-07-16 DIAGNOSIS — S88119D Complete traumatic amputation at level between knee and ankle, unspecified lower leg, subsequent encounter: Secondary | ICD-10-CM

## 2017-07-16 LAB — COMPREHENSIVE METABOLIC PANEL
ALT: 43 U/L (ref 14–54)
AST: 42 U/L — ABNORMAL HIGH (ref 15–41)
Albumin: 2.5 g/dL — ABNORMAL LOW (ref 3.5–5.0)
Alkaline Phosphatase: 98 U/L (ref 38–126)
Anion gap: 10 (ref 5–15)
BUN: 22 mg/dL — ABNORMAL HIGH (ref 6–20)
CO2: 27 mmol/L (ref 22–32)
Calcium: 9.1 mg/dL (ref 8.9–10.3)
Chloride: 98 mmol/L — ABNORMAL LOW (ref 101–111)
Creatinine, Ser: 1.37 mg/dL — ABNORMAL HIGH (ref 0.44–1.00)
GFR calc Af Amer: 39 mL/min — ABNORMAL LOW (ref >60)
GFR calc non Af Amer: 33 mL/min — ABNORMAL LOW (ref >60)
Glucose, Bld: 113 mg/dL — ABNORMAL HIGH (ref 65–99)
Potassium: 4.3 mmol/L (ref 3.5–5.1)
Sodium: 135 mmol/L (ref 135–145)
Total Bilirubin: 1 mg/dL (ref 0.3–1.2)
Total Protein: 5.4 g/dL — ABNORMAL LOW (ref 6.5–8.1)

## 2017-07-16 LAB — CBC WITH DIFFERENTIAL/PLATELET
Basophils Absolute: 0 10*3/uL (ref 0.0–0.1)
Basophils Relative: 0 %
Eosinophils Absolute: 0.2 10*3/uL (ref 0.0–0.7)
Eosinophils Relative: 1 %
HCT: 34.6 % — ABNORMAL LOW (ref 36.0–46.0)
Hemoglobin: 11.2 g/dL — ABNORMAL LOW (ref 12.0–15.0)
Lymphocytes Relative: 13 %
Lymphs Abs: 2.3 10*3/uL (ref 0.7–4.0)
MCH: 29.2 pg (ref 26.0–34.0)
MCHC: 32.4 g/dL (ref 30.0–36.0)
MCV: 90.1 fL (ref 78.0–100.0)
Monocytes Absolute: 1.1 10*3/uL — ABNORMAL HIGH (ref 0.1–1.0)
Monocytes Relative: 6 %
Neutro Abs: 14.3 10*3/uL — ABNORMAL HIGH (ref 1.7–7.7)
Neutrophils Relative %: 80 %
Platelets: 278 10*3/uL (ref 150–400)
RBC: 3.84 MIL/uL — ABNORMAL LOW (ref 3.87–5.11)
RDW: 14.2 % (ref 11.5–15.5)
WBC: 17.9 10*3/uL — ABNORMAL HIGH (ref 4.0–10.5)

## 2017-07-16 MED ORDER — CEPHALEXIN 250 MG PO CAPS
250.0000 mg | ORAL_CAPSULE | Freq: Three times a day (TID) | ORAL | Status: DC
  Administered 2017-07-16 – 2017-07-19 (×11): 250 mg via ORAL
  Filled 2017-07-16 (×11): qty 1

## 2017-07-16 NOTE — Evaluation (Addendum)
Occupational Therapy Assessment and Plan Re-Evaluation d/t interrupted stay  Patient Details  Name: [name] Petersen MRN: [id number] Date of Birth: 1926-06-14  OT Diagnosis: acute pain, cognitive deficits and muscle weakness (generalized) Rehab Potential: Rehab Potential (ACUTE ONLY): Good ELOS: 10-14   Today's Date: 07/16/2017 OT Individual Time: 1100-1200 and 1415-1445 OT Individual Time Calculation (min): 60 min   And 30 min  Problem List:  Patient Active Problem List   Diagnosis Date Noted  . Unilateral complete BKA, left, initial encounter ([place]) 07/15/2017  . Atherosclerotic PVD with ulceration ([place]) 07/14/2017  . Pressure injury of right heel, stage 3 ([place])   . Sleep disturbance   . Poor nutrition   . Hypokalemia   . Drug-induced constipation   . Essential hypertension   . Stage 3 chronic kidney disease ([place])   . Postoperative pain   . Diabetes mellitus type 2 in obese ([place])   . S/P below knee amputation, left ([place]) 07/10/2017  . PAD (peripheral artery disease) ([place]) 07/06/2017  . Atherosclerosis of native arteries of the extremities with ulceration ([place]) 06/16/2017    Past Medical History:  Past Medical History:  Diagnosis Date  . Arthritis    hands  . Atherosclerotic PVD with ulceration ([place]) 07/15/2017   RIGHT HEEL  . Complication of anesthesia    lost eyesight for a few days after surgery in the 1950's  . Constipation   . Diabetes mellitus without complication (HCC)    no medications   . Hypertension   . Macular degeneration, bilateral    "one eye wet; one eye dry; she gets shots" (07/07/2017)  . PAD (peripheral artery disease) ([place])   . Pneumonia   . Stroke [facility])    some trouble swallowing   Past Surgical History:  Past Surgical History:  Procedure Laterality Date  . ABDOMINAL AORTOGRAM W/LOWER EXTREMITY N/A 06/16/2017   Procedure: ABDOMINAL AORTOGRAM W/LOWER EXTREMITY;  Surgeon: Conrad Roscoe, MD;  Location: [place] [facility];  Service: Cardiovascular;   Laterality: N/A;  lt extermity  . ABDOMINAL HYSTERECTOMY    . AMPUTATION Left 07/06/2017   Procedure: AMPUTATION BELOW KNEE LEFT;  Surgeon: Rosetta Posner, MD;  Location: [place];  Service: Vascular;  Laterality: Left;  . CATARACT EXTRACTION W/ INTRAOCULAR LENS  IMPLANT, BILATERAL    . DILATION AND CURETTAGE OF UTERUS    . LOWER EXTREMITY ANGIOGRAPHY N/A 07/14/2017   Procedure: LOWER EXTREMITY ANGIOGRAPHY;  Surgeon: Angelia Mould, MD;  Location: [place] [facility];  Service: Cardiovascular;  Laterality: N/A;    Assessment & Plan Clinical Impression:   [name] Petersen is a 84 y.o. right-handed female with history of CVA maintained on Plavix, hypertension, diet-controlled diabetes mellitus,CKD stage III.  Per chart review patient lives alone.  She was independent up until January and then began using a walker.  One level home.  She does not drive.  She has 3 daughters who can assist as needed.  Presented 07/06/2017 with critical limb ischemia left lower extremity with unreconstructable distal tibial disease.  No change with conservative care.  Underwent left BKA 07/06/2017 per Dr. [name].  Hospital course pain management.  Monitoring of creatinine 1.97 from baseline 1.30-1.59 and Cozaar was held.  Subcutaneous heparin for DVT prophylaxis.  Physical and occupational therapy evaluations completed with recommendations of physical medicine rehab consult.  Patient was admitted for a comprehensive rehab program  Patient currently requires min- max with basic self-care skills secondary to muscle weakness, decreased cardiorespiratoy endurance, decreased attention, decreased  problem solving, decreased safety awareness, decreased memory and delayed processing and decreased sitting balance, decreased standing balance and decreased balance strategies.  Prior to hospitalization, patient could complete BADLs with modified independent .  Patient will benefit from skilled intervention to decrease level of assist with  basic self-care skills and increase independence with basic self-care skills prior to discharge home with care partner.  Anticipate patient will require 24 hour supervision and minimal physical assistance and follow up home health.  OT - End of Session Activity Tolerance: Tolerates 30+ min activity with multiple rests Endurance Deficit: Yes Endurance Deficit Description: poor tolerance to sitting up due to pain, N and V OT Assessment Rehab Potential (ACUTE ONLY): Good OT Barriers to Discharge: Incontinence OT Patient demonstrates impairments in the following area(s): Balance;Cognition;Endurance;Motor;Pain;Safety;Skin Integrity OT Basic ADL's Functional Problem(s): Grooming;Bathing;Dressing;Toileting OT Advanced ADL's Functional Problem(s): Simple Meal Preparation OT Transfers Functional Problem(s): Toilet;Tub/Shower OT Plan OT Intensity: Minimum of 1-2 x/day, 45 to 90 minutes OT Frequency: 5 out of 7 days OT Duration/Estimated Length of Stay: 10-14 OT Treatment/Interventions: Balance/vestibular training;Cognitive remediation/compensation;Discharge planning;Disease mangement/prevention;DME/adaptive equipment instruction;Functional mobility training;Pain management;Patient/family education;Psychosocial support;Self Care/advanced ADL retraining;Skin care/wound managment;Splinting/orthotics;Therapeutic Activities;Therapeutic Exercise;UE/LE Strength taining/ROM;Wheelchair propulsion/positioning OT Basic Self-Care Anticipated Outcome(s): MIN A OT Toileting Anticipated Outcome(s): MIN A OT Bathroom Transfers Anticipated Outcome(s): MIN A OT Recommendation Patient destination: Home Follow Up Recommendations: Home health OT;24 hour supervision/assistance Equipment Recommended: 3 in 1 bedside comode   Skilled Therapeutic Intervention 1:1. Pt received seated in w/c already dressed and ready to go. Pt declines bathing/dressing toileting this session wanting to work on mobility/UE strength. Pt  completes 3x30 ball toss activity with focus on chest pass and overhead pass for tricep strength required for functional transfers with 1.5# wrist weights. OT instructs pt on w/c push up technique 3x10 reps with pt originally not able to clear buttocks, but with demonstration cueing and stabilization of R knee pt able to fully clear buttocks and straighten arms for 2 second isometric hold. Pt propels w/c back to room with min A for steering and VC for propulsion technique. Exited session with pt seated in w/c set up to eat meal and all needs met.   Session 2: Pt and granddaughter present. Granddaughter requesting pt be taken to toilet to attempt to void bladder. Pt completes supine>sitting with touching A and sit to stand in steady with MOD lifting A. Pt transfers onto commode with total A in stedy and OT completes all clothing mangement. Pt unable to void bladder despite increased time. Pt requesting to return to bed after washing hands and combing hair at sink with supervision. Pt slide board transfer w/c>EOB with MOD A and VC for head/hips relationship. Exited session with pt seated in bed resting with exit alarm on and family in room.   OT Evaluation Precautions/Restrictions  Precautions Precautions: Fall Restrictions Weight Bearing Restrictions: No LLE Weight Bearing: Non weight bearing General Chart Reviewed: Yes Vital Signs Therapy Vitals BP: 128/69 Pain Pain Assessment Pain Scale: 0-10 Pain Score: 6  Pain Type: Acute pain Pain Location: Leg Pain Orientation: Left Pain Descriptors / Indicators: Aching Pain Frequency: Intermittent Pain Onset: On-going Patients Stated Pain Goal: 2 Pain Intervention(s): Medication (See eMAR) Home Living/Prior Functioning Home Living Family/patient expects to be discharged to:: Private residence Living Arrangements: Alone Available Help at Discharge: Family, Available 24 hours/day Type of Home: Other(Comment) Home Access: Stairs to  enter [employer] of Steps: 2 Home Layout: One level Bathroom Shower/Tub: Gaffer, Curtain Bathroom Toilet: Standard  Lives With:  Alone IADL History Homemaking Responsibilities: Yes Meal Prep Responsibility: Primary Laundry Responsibility: Primary Cleaning Responsibility: Primary Bill Paying/Finance Responsibility: Primary Shopping Responsibility: No Prior Function Level of Independence: Independent with basic ADLs, Requires assistive device for independence  Able to Take Stairs?: Yes Driving: No Vocation: Retired Comments: Has been using RW since January; mod indep with this and ADLs ADL   Vision Baseline Vision/History: Macular Degeneration Patient Visual Report: No change from baseline Vision Assessment?: Vision impaired- to be further tested in functional context Perception  Perception: Within Functional Limits Praxis Praxis: Intact Cognition Overall Cognitive Status: Within Functional Limits for tasks assessed Arousal/Alertness: Awake/alert Orientation Level: Person;Place;Situation Person: Oriented Place: Oriented Situation: Oriented Year: 2019 Month: March Day of Week: Correct Memory: Appears intact Immediate Memory Recall: Sock;Blue;Bed Memory Recall: Blue;Sock Memory Recall Sock: Without Cue Memory Recall Blue: Without Cue Awareness: Appears intact Problem Solving: Appears intact Safety/Judgment: Appears intact Sensation Sensation Light Touch: Appears Intact Proprioception: Appears Intact Coordination Gross Motor Movements are Fluid and Coordinated: No Fine Motor Movements are Fluid and Coordinated: No Heel Shin Test: NT Motor  Motor Motor: Within Functional Limits Motor - Skilled Clinical Observations: slow movements x 4 extremities Mobility  Bed Mobility Bed Mobility: Rolling Right;Rolling Left;Supine to Sit;Sit to Supine Rolling Right: 5: Supervision Rolling Left: 5: Supervision Supine to Sit: 3: Mod assist Supine to Sit  Details: Verbal cues for safe use of DME/AE;Verbal cues for precautions/safety Sit to Supine: 4: Min assist Sit to Supine - Details: Verbal cues for precautions/safety;Verbal cues for safe use of DME/AE Transfers Sit to Stand: 3: Mod assist Sit to Stand Details: Verbal cues for technique;Verbal cues for precautions/safety;Verbal cues for safe use of DME/AE Sit to Stand Details (indicate cue type and reason): in parallel bars.   Trunk/Postural Assessment  Cervical Assessment Cervical Assessment: Within Functional Limits Thoracic Assessment Thoracic Assessment: Within Functional Limits Lumbar Assessment Lumbar Assessment: Within Functional Limits Postural Control Postural Control: Deficits on evaluation Righting Reactions: LOB backwards with delayed reactions  Balance Static Sitting Balance Static Sitting - Level of Assistance: 5: Stand by assistance Dynamic Sitting Balance Dynamic Sitting - Level of Assistance: 4: Min assist Sitting balance - Comments: Reliant on BUEs to maintain seated balance and min-maxA (suspect due to lethargy) Static Standing Balance Static Standing - Level of Assistance: 4: Min assist(in parallel bars. ) Dynamic Standing Balance Dynamic Standing - Level of Assistance: Not tested (comment) Extremity/Trunk Assessment RUE Assessment RUE Assessment: Within Functional Limits LUE Assessment LUE Assessment: Within Functional Limits   See Function Navigator for Current Functional Status.   Refer to Care Plan for Long Term Goals  Recommendations for other services: Therapeutic Recreation  Pet therapy   Discharge Criteria: Patient will be discharged from OT if patient refuses treatment 3 consecutive times without medical reason, if treatment goals not met, if there is a change in medical status, if patient makes no progress towards goals or if patient is discharged from hospital.  The above assessment, treatment plan, treatment alternatives and goals were  discussed and mutually agreed upon: by patient and by family  Tonny Branch 07/16/2017, 12:54 PM

## 2017-07-16 NOTE — Progress Notes (Signed)
Physical Therapy Assessment and Plan:  Re-evaluation due to interrupted stay   Patient Details  Name: [name] Petersen MRN: [id number] Date of Birth: Mar 09, 1927  PT Diagnosis: Difficulty walking, Muscle weakness and Pain in L residual limb Rehab Potential: Good ELOS: 14-17 from initial evaluation   Today's Date: 07/16/2017 PT Individual Time: 0800-0900 PT Individual Time Calculation (min): 60 min    Problem List:  Patient Active Problem List   Diagnosis Date Noted  . Unilateral complete BKA, left, initial encounter ([place]) 07/15/2017  . Atherosclerotic PVD with ulceration ([place]) 07/14/2017  . Pressure injury of right heel, stage 3 ([place])   . Sleep disturbance   . Poor nutrition   . Hypokalemia   . Drug-induced constipation   . Essential hypertension   . Stage 3 chronic kidney disease ([place])   . Postoperative pain   . Diabetes mellitus type 2 in obese ([place])   . S/P below knee amputation, left ([place]) 07/10/2017  . PAD (peripheral artery disease) ([place]) 07/06/2017  . Atherosclerosis of native arteries of the extremities with ulceration ([place]) 06/16/2017    Past Medical History:  Past Medical History:  Diagnosis Date  . Arthritis    hands  . Atherosclerotic PVD with ulceration ([place]) 07/15/2017   RIGHT HEEL  . Complication of anesthesia    lost eyesight for a few days after surgery in the 1950's  . Constipation   . Diabetes mellitus without complication (HCC)    no medications   . Hypertension   . Macular degeneration, bilateral    "one eye wet; one eye dry; she gets shots" (07/07/2017)  . PAD (peripheral artery disease) ([place])   . Pneumonia   . Stroke [facility])    some trouble swallowing   Past Surgical History:  Past Surgical History:  Procedure Laterality Date  . ABDOMINAL AORTOGRAM W/LOWER EXTREMITY N/A 06/16/2017   Procedure: ABDOMINAL AORTOGRAM W/LOWER EXTREMITY;  Surgeon: [name] Whitney, MD;  Location: [place] [facility];  Service: Cardiovascular;  Laterality: N/A;  lt  extermity  . ABDOMINAL HYSTERECTOMY    . AMPUTATION Left 07/06/2017   Procedure: AMPUTATION BELOW KNEE LEFT;  Surgeon: [name] Posner, MD;  Location: [place];  Service: Vascular;  Laterality: Left;  . CATARACT EXTRACTION W/ INTRAOCULAR LENS  IMPLANT, BILATERAL    . DILATION AND CURETTAGE OF UTERUS    . LOWER EXTREMITY ANGIOGRAPHY N/A 07/14/2017   Procedure: LOWER EXTREMITY ANGIOGRAPHY;  Surgeon: [name] Mould, MD;  Location: [place] [facility];  Service: Cardiovascular;  Laterality: N/A;    Assessment & Plan Clinical Impression: Patient is a 84 y.o. With history of CVA maintained on Plavix, hypertension, diet-controlled diabetes mellitus,CKD stage III. Who was admitted on 07/06/2017 with critical limb ischemia left lower extremity and underwent  left BKA by [name] Petersen. Hospital course significant issues with pain control, acute on chronic renal failure as well as deficits in mobility and self care tasks. She was admitted to CIR on 07/10/17. Patient went off of inpatient rehab 3/28 for arteriogram.  Patient stayed on a telemetry post cath recovery unit over night for monitoring.  She has been cleared for re-admit to acute inpatient rehab.  Patient transferred to CIR on 07/15/2017 .   Patient currently requires mod with mobility secondary to muscle weakness and muscle joint tightness, decreased cardiorespiratoy endurance and decreased sitting balance, decreased standing balance and decreased balance strategies.  Prior to hospitalization, patient was modified independent  with mobility and lived with Alone in a Other(Comment)(Condo) home.  Home access is 2Stairs to enter(planning to have ramp built).  Patient will benefit from skilled PT intervention to maximize safe functional mobility, minimize fall risk and decrease caregiver burden for planned discharge home with 24 hour assist.  Anticipate patient will benefit from follow up [facility] at discharge.  PT - End of Session Activity Tolerance: Tolerates <  10 min activity with changes in vital signs Endurance Deficit: Yes Endurance Deficit Description: poor tolerance to sitting up due to pain, N and V PT Assessment Rehab Potential (ACUTE/IP ONLY): Good PT Barriers to Discharge: Inaccessible home environment;Decreased caregiver support;Home environment access/layout PT Patient demonstrates impairments in the following area(s): Balance;Behavior;Edema;Endurance;Motor;Nutrition;Pain;Skin Integrity;Safety PT Transfers Functional Problem(s): Bed Mobility;Bed to Chair;Car;Furniture;Floor PT Locomotion Functional Problem(s): Ambulation;Wheelchair Mobility;Stairs PT Plan PT Intensity: Minimum of 1-2 x/day ,45 to 90 minutes PT Frequency: 5 out of 7 days PT Duration Estimated Length of Stay: 14-17 from initial evaluation PT Treatment/Interventions: Financial risk analyst;Neuromuscular re-education;Psychosocial support;UE/LE Strength taining/ROM;Wheelchair propulsion/positioning;Balance/vestibular training;Discharge planning;Pain management;Skin care/wound management;Therapeutic Activities;UE/LE Coordination activities;Cognitive remediation/compensation;Functional mobility training;Patient/family education;Splinting/orthotics;Therapeutic Exercise PT Transfers Anticipated Outcome(s): Min-supervision assist transfers with LRAD  PT Locomotion Anticipated Outcome(s): Supervision assist at [facility] level.  PT Recommendation Recommendations for Other Services: Therapeutic Recreation consult;Other (comment)(aromatherapy ) Therapeutic Recreation Interventions: Stress management;Kitchen group Follow Up Recommendations: Home health PT Patient destination: Home Equipment Recommended: Wheelchair (measurements);Wheelchair cushion (measurements) Equipment Details: amputee pad and possibly w/c back  Skilled Therapeutic Intervention PT instructed patient in PT re-Evaluation following interrupted stay and initiated treatment intervention; PT  educated patient in [place], rehab potential, rehab goals, and discharge recommendations. Pt demonstrated significant improvement since initial evaluation with min-Mod bed mobility, min-mod assist Transfers with SB, as well as min assist WC mobility up to 17f;  see below for details. Patient returned to room and [facility] and all needs met.     PT Evaluation Precautions/Restrictions   General   Vital Signs Pain Pain Assessment Pain Scale: 0-10 Pain Score: 6  Pain Type: Surgical pain Pain Location: Leg Pain Orientation: Left Pain Descriptors / Indicators: Aching Pain Frequency: Intermittent Pain Onset: Gradual Patients Stated Pain Goal: 2 Pain Intervention(s): Medication (See eMAR) Home Living/Prior Functioning Home Living Available Help at Discharge: Family;Available 24 hours/day Type of Home: Other(Comment)(Condo) Home Access: Stairs to enter(planning to have ramp built) Entrance Stairs-Number of Steps: 2 Home Layout: One level Bathroom Shower/Tub: Walk-in shower;Curtain(has grab bars and shower seat) Bathroom Toilet: Standard  Lives With: Alone Prior Function Level of Independence: Independent with basic ADLs;Requires assistive device for independence  Able to Take Stairs?: Yes Driving: No Vocation: Retired Comments: Has been using RW since January; mod indep with this and ADLs Vision/Perception  Perception Perception: Within Functional Limits Praxis Praxis: Intact  Cognition Orientation Level: Oriented X4;Oriented to person;Oriented to place;Oriented to time Memory: Appears intact Awareness: Appears intact Problem Solving: Appears intact Safety/Judgment: Appears intact Sensation Sensation Light Touch: Appears Intact Proprioception: Appears Intact Coordination Gross Motor Movements are Fluid and Coordinated: No Fine Motor Movements are Fluid and Coordinated: No Heel Shin Test: NT Motor  Motor Motor: Within Functional  Limits Motor - Skilled Clinical Observations: slow movements x 4 extremities  Mobility Bed Mobility Bed Mobility: Rolling Right;Rolling Left;Supine to Sit;Sit to Supine Rolling Right: 5: Supervision Rolling Left: 5: Supervision Supine to Sit: 3: Mod assist Supine to Sit Details: Verbal cues for safe use of DME/AE;Verbal cues for precautions/safety Sit to Supine: 4: Min assist Sit to Supine - Details: Verbal cues for precautions/safety;Verbal cues  for safe use of DME/AE Transfers Transfers: Yes Sit to Stand: 3: Mod assist Sit to Stand Details: Verbal cues for technique;Verbal cues for precautions/safety;Verbal cues for safe use of DME/AE Sit to Stand Details (indicate cue type and reason): in parallel bars.  Lateral/Scoot Transfers: 4: Min assist;3: Mod assist Lateral/Scoot Transfer Details: Verbal cues for technique;Verbal cues for precautions/safety;Verbal cues for safe use of DME/AE;Manual facilitation for weight shifting;Manual facilitation for placement Lateral/Scoot Transfer Details (indicate cue type and reason): compelted with SB Min assist to the R. Mod assist to the L.  Locomotion  Ambulation Ambulation: No Gait Gait: No Stairs / Additional Locomotion Stairs: No Architect: Yes Wheelchair Assistance: 4: Advertising account executive Details: Verbal cues for safe use of DME/AE;Verbal cues for precautions/safety;Verbal cues for technique;Manual facilitation for Horticulturist, commercial: Both upper extremities Wheelchair Parts Management: Needs assistance Distance: 180f  Trunk/Postural Assessment  Cervical Assessment Cervical Assessment: Within Functional Limits Thoracic Assessment Thoracic Assessment: Within Functional Limits Lumbar Assessment Lumbar Assessment: Within Functional Limits Postural Control Postural Control: Deficits on evaluation Righting Reactions: LOB backwards with delayed reactions  Balance Static Sitting  Balance Static Sitting - Level of Assistance: 5: Stand by assistance Dynamic Sitting Balance Dynamic Sitting - Level of Assistance: 4: Min assist Static Standing Balance Static Standing - Level of Assistance: 4: Min assist(in parallel bars. ) Extremity Assessment      RLE Assessment RLE Assessment: Exceptions to [facility] CenterRLE Strength RLE Overall Strength Comments: grossly 4-/5 to 4/5 proximal to distal LLE Assessment LLE Assessment: Exceptions to [facility] HospitalLLE Strength LLE Overall Strength Comments: Hip grossly 4-/5. 3/5 knee   See Function Navigator for Current Functional Status.   Refer to Care Plan for Long Term Goals  Recommendations for other services: Therapeutic Recreation  Stress management  Discharge Criteria: Patient will be discharged from PT if patient refuses treatment 3 consecutive times without medical reason, if treatment goals not met, if there is a change in medical status, if patient makes no progress towards goals or if patient is discharged from hospital.  The above assessment, treatment plan, treatment alternatives and goals were discussed and mutually agreed upon: by patient and by family  ALorie Phenix3/30/2019, 9:30 AM

## 2017-07-16 NOTE — Progress Notes (Signed)
[place] PHYSICAL MEDICINE & REHABILITATION     PROGRESS NOTE  Subjective/Complaints:   No issues overnite except constipation  ROS: Patient denies fever, rash, sore throat, blurred vision, nausea, vomiting, diarrhea, cough, shortness of breath or chest pain, joint or back pain, headache, or mood change.   Objective: Vital Signs: Blood pressure 138/81, pulse (!) 102, temperature (!) 97.4 F (36.3 C), temperature source Oral, resp. rate 18, height 5\' 2"  (1.575 m), weight 71.3 kg (157 lb 3 oz), SpO2 100 %. No results found. Recent Labs    07/15/17 1630 07/16/17 0619  WBC 17.7* PENDING  HGB 11.0* 11.2*  HCT 34.8* 34.6*  PLT 268 PENDING   Recent Labs    07/14/17 1251 07/15/17 1630  NA 137  --   K 3.7  --   CL 96*  --   GLUCOSE 94  --   BUN 18  --   CREATININE 1.00 1.33*   CBG (last 3)  Recent Labs    07/14/17 1435  GLUCAP 85    Wt Readings from Last 3 Encounters:  07/15/17 71.3 kg (157 lb 3 oz)  07/15/17 71.3 kg (157 lb 3 oz)  07/11/17 76 kg (167 lb 8.8 oz)    Physical Exam:  BP 138/81 (BP Location: Right Arm)   Pulse (!) 102   Temp (!) 97.4 F (36.3 C) (Oral)   Resp 18   Ht 5\' 2"  (1.575 m)   Wt 71.3 kg (157 lb 3 oz)   SpO2 100%   BMI 28.75 kg/m  Constitutional: No distress . Vital signs reviewed. Eyes closed HEENT: EOMI, oral membranes moist Cardiovascular: RRR without murmur. No JVD    Respiratory: CTA Bilaterally without wheezes or rales. Normal effort    GI: BS +, non-tender, non-distended  Musculoskeletal: Left stump edema. Left BKA and right foot sensitive to touch. Neurological.   Slow to arouse Follows simple commands Motor: Bilateral upper extremities: 4+/5 proximal to distal Right lower extremity: Hip flexion, knee extension 4-4+/5, ankle dorsiflexion 5/5  Left lower extremity: Hip flexion 3-4/5 Skin: Incision with staples C/D/I Right heel ulcer just behind lateral malleolus,  stage II, about 2-3cm in diameter Psych: Flat but cooperates  when awake.   Assessment/Plan: 1. Functional deficits secondary to left BKA which require 3+ hours per day of interdisciplinary therapy in a comprehensive inpatient rehab setting. Physiatrist is providing close team supervision and 24 hour management of active medical problems listed below. Physiatrist and rehab team continue to assess barriers to discharge/monitor patient progress toward functional and medical goals.  Function:  Bathing Bathing position      Bathing parts      Bathing assist        Upper Body Dressing/Undressing Upper body dressing                    Upper body assist        Lower Body Dressing/Undressing Lower body dressing                                  Lower body assist        Toileting Toileting Toileting activity did not occur: No continent bowel/bladder event        Toileting assist     Transfers Chair/bed Optician, dispensingtransfer             Locomotion Ambulation           Wheelchair  Cognition Comprehension Comprehension assist level: Follows basic conversation/direction with no assist  Expression Expression assist level: Expresses basic needs/ideas: With extra time/assistive device  Social Interaction Social Interaction assist level: Interacts appropriately 75 - 89% of the time - Needs redirection for appropriate language or to initiate interaction.  Problem Solving Problem solving assist level: Solves basic 50 - 74% of the time/requires cueing 25 - 49% of the time  Memory Memory assist level: Recognizes or recalls 50 - 74% of the time/requires cueing 25 - 49% of the time    Medical Problem List and Plan: 1.  Decreased functional mobility secondary to left BKA secondary to peripheral vascular disease 07/06/2017. Pt s/p arteriogram, transferred off unit post-procedure. Now back to rehab.    -PT, OT re evals today 2.  DVT Prophylaxis/Anticoagulation: Subcutaneous heparin. Monitor for any bleeding episodes 3. Pain  Management: using tramadol given poor opioid tolerance.  4. Mood: Provide emotional support 5. Neuropsych: This patient is not consistently capable of making decisions on her own behalf. 6. Skin/Wound Care: Routine skin checks 7. Fluids/Electrolytes/Nutrition: Routine I/O's    Resume remeron for sleep/appetite 8.  CKD stage III.  Creatinine baseline 1.30-1.59.     Creatinine 1.24 on 3/25   Encourage fluids   Continue to monitor 9.  Hypertension.  Norvasc 10 mg daily, (HCTZ 12.5 mg twice daily on HOLD. )     Monitor with increased mobility   Cozaar 50 started on 3/26 but not continued?---resume today    Vitals:   07/16/17 0100 07/16/17 0139  BP: (!) 148/60 138/81  Pulse: 87 (!) 102  Resp: 17 18  Temp: 98 F (36.7 C) (!) 97.4 F (36.3 C)  SpO2: 93% 100%  Cont to monitor 3/30   10.  History of CVA.  Continue Plavix. 11.  Diet controlled diabetes mellitus.  Hemoglobin A1c 6.0.      -monitor CBG's 12. Obesity   Body mass index is 28.75 kg/m.   Diet and exercise education   Encourage weight loss to increase endurance and promote overall health 13. Hypokalemia   K+ 3.3 on 3/25   Labs ordered for tomorrow   Supplemented x2 days 14. Drug induced constipation   KUB reviewed, suggesting constipation   Family notes combo of 2 Senna S and prune juice work well 15. Sleep disturbance- slept very well yesterday   Remeron initiated on 3/27   Melatonin 1.5mg  started on 3/27  LOS (Days) 1 A FACE TO FACE EVALUATION WAS PERFORMED  Victorino Sparrow Zeev Deakins 07/16/2017 8:00 AM

## 2017-07-16 NOTE — Progress Notes (Signed)
Physical Therapy Session Note  Patient Details  Name: [name] Petersen MRN: [id number] Date of Birth: 12-Jan-1927  Today's Date: 07/16/2017 PT Individual Time: 1540-1620 PT Individual Time Calculation (min): 40 min   Short Term Goals: Week 1:  PT Short Term Goal 1 (Week 1): pt will roll L >< R with mod assist PT Short Term Goal 2 (Week 1): pt will move supine> sit with mod assist PT Short Term Goal 3 (Week 1): pt will transfer w/c>< bed wiht assist of 1 person PT Short Term Goal 4 (Week 1): pt will propel w/c x 50' with min assist PT Short Term Goal 5 (Week 1): pt will state 3 components of basic transfer  Skilled Therapeutic Interventions/Progress Updates:   Pt received supine in bed and agreeable to PT. Supine>sit transfer with supervision assist heavy use of bed rails.   SB transfer to the L with Mod assist from PT with moderate cues for safety and anterior weight shift.   WC mobility 122f +1560fwith supervision assist min cues for proper use of BUE to maintain straight path.   Sit<>stand in parallel bars with min assist x 4. Min cues for proper UE use, pt allowed to pull on bars.   PT initiated gait training with mod assist in parallel bars x 2 ft . Max cues from PT for imrpoved use of BUE to prevent excessive WB through R heel.   Press up to lift gluteal region off seat. X 8 with 3 second hold. PT blocked R knee to improve safety.   [facility] returned to [facility] and [facility] and all needs met.         Therapy Documentation Precautions:  Precautions Precautions: Fall Restrictions Weight Bearing Restrictions: No LLE Weight Bearing: Non weight bearing Pain: Pain Assessment Pain Scale: 0-10 Pain Score: 0-No pain   See Function Navigator for Current Functional Status.   Therapy/Group: Individual Therapy  AuLorie Phenix/30/2019, 4:11 PM

## 2017-07-17 ENCOUNTER — Inpatient Hospital Stay (HOSPITAL_COMMUNITY): Payer: Medicare Other

## 2017-07-17 MED ORDER — ADULT MULTIVITAMIN LIQUID CH
15.0000 mL | Freq: Every day | ORAL | Status: DC
  Administered 2017-07-17 – 2017-07-29 (×13): 15 mL via ORAL
  Filled 2017-07-17 (×13): qty 15

## 2017-07-17 NOTE — Progress Notes (Signed)
Occupational Therapy Session Note  Patient Details  Name: [name] Petersen MRN: [id number] Date of Birth: 05/24/1926  Today's Date: 07/17/2017 OT Individual Time: 1345-1430 OT Individual Time Calculation (min): 45 min    Short Term Goals: Week 1:  OT Short Term Goal 1 (Week 1): Pt will complete bathing with mod assist OT Short Term Goal 2 (Week 1): Pt will complete LB dressing with max assist of 1 caregiver OT Short Term Goal 3 (Week 1): Pt will complete toilet transfer with MOD assist of 1 caregiver  Skilled Therapeutic Interventions/Progress Updates:     1:1. Daughter present throughout session and requesting pt attempt to void bladder. Pt squat pivot transfer EOB>w/c>BSC with grab bar with MOD A and VC for sequencing and hand placement. Pt stands for clothing management total A. Pt given increased time and requesting cup of warm water (dipped fingers in yesterday to "inspire" herself to pee) however pt pours cup of warm water over stomach/peri area and onto floor and pants. Despite increased time pt unable to void. OT educated pt that pouring cup of water on front is safety risk as it spills onto floor. PT verbalized understanding. Pt completes stedy transfer back to w/c with touching A for sit to stand. Pt completes UB dressing at sink with set up and dons pants with VC to thread RLE in first and A to advance pants past hips in standing with MOD A and knee block. Pt able to don new sock with increased time and VC for crossing RLE over LLE. Exited session with pt seatedin w/c call light in reach and daughter present  Therapy Documentation Precautions:  Precautions Precautions: Fall Restrictions Weight Bearing Restrictions: No LLE Weight Bearing: Non weight bearing  See Function Navigator for Current Functional Status.   Therapy/Group: Individual Therapy  [name] Petersen 07/17/2017, 2:27 PM

## 2017-07-17 NOTE — Progress Notes (Signed)
[place] PHYSICAL MEDICINE & REHABILITATION     PROGRESS NOTE  Subjective/Complaints:   Poor sleep, housekeepingcleaning the floors, also requiring cath x1 lst noc, bowels moved DDIscussed reducing pain meds, pt doesn't remember what occurred yesterday family is filling in the blanks  ROS: Patient denies fever, rash, sore throat, blurred vision, nausea, vomiting, diarrhea, cough, shortness of breath or chest pain, joint or back pain, headache, or mood change.   Objective: Vital Signs: Blood pressure (!) 145/67, pulse 81, temperature 98.1 F (36.7 C), temperature source Oral, resp. rate 18, height _0  (1.575 m), weight 71.3 kg (157 lb 3 oz), SpO2 97 %. No results found. Recent Labs    07/15/17 1630 07/16/17 0619  WBC 17.7* 17.9*  HGB 11.0* 11.2*  HCT 34.8* 34.6*  PLT 268 278   Recent Labs    07/14/17 1251 07/15/17 1630 07/16/17 0619  NA 137  --  135  K 3.7  --  4.3  CL 96*  --  98*  GLUCOSE 94  --  113*  BUN 18  --  22*  CREATININE 1.00 1.33* 1.37*  CALCIUM  --   --  9.1   CBG (last 3)  Recent Labs    07/14/17 1435  GLUCAP 85    Wt Readings from Last 3 Encounters:  07/15/17 71.3 kg (157 lb 3 oz)  07/15/17 71.3 kg (157 lb 3 oz)  07/11/17 76 kg (167 lb 8.8 oz)    Physical Exam:  BP (!) 145/67 (BP Location: Right Arm)   Pulse 81   Temp 98.1 F (36.7 C) (Oral)   Resp 18   Ht _1  (1.575 m)   Wt 71.3 kg (157 lb 3 oz)   SpO2 97%   BMI 28.75 kg/m  Constitutional: No distress . Vital signs reviewed. Eyes closed HEENT: EOMI, oral membranes moist Cardiovascular: RRR without murmur. No JVD    Respiratory: CTA Bilaterally without wheezes or rales. Normal effort    GI: BS +, non-tender, non-distended  Musculoskeletal: Left stump edema. Left BKA and right foot sensitive to touch. Neurological.   Slow to arouse Follows simple commands Motor: Bilateral upper extremities: 4+/5 proximal to distal Right lower extremity: Hip flexion, knee extension 4-4+/5, ankle  dorsiflexion 5/5  Left lower extremity: Hip flexion 3-4/5 Skin: Incision with staples C/D/I Right heel ulcer just behind lateral malleolus,  stage II, about 2-3cm in diameter Psych: Flat but cooperates when awake.   Assessment/Plan: 1. Functional deficits secondary to left BKA which require 3+ hours per day of interdisciplinary therapy in a comprehensive inpatient rehab setting. Physiatrist is providing close team supervision and 24 hour management of active medical problems listed below. Physiatrist and rehab team continue to assess barriers to discharge/monitor patient progress toward functional and medical goals.  Function:  Bathing Bathing position Bathing activity did not occur: Refused    Bathing parts      Bathing assist        Upper Body Dressing/Undressing Upper body dressing Upper body dressing/undressing activity did not occur: Refused                  Upper body assist        Lower Body Dressing/Undressing Lower body dressing Lower body dressing/undressing activity did not occur: Refused                                Lower body assist  Toileting Toileting Toileting activity did not occur: No continent bowel/bladder event   Toileting steps completed by helper: Adjust clothing prior to toileting, Performs perineal hygiene, Adjust clothing after toileting    Toileting assist Assist level: Two helpers   Transfers Chair/bed transfer   Chair/bed transfer method: Lateral scoot Chair/bed transfer assist level: Moderate assist (Pt 50 - 74%/lift or lower) Chair/bed transfer assistive device: Armrests, Sliding board     Locomotion Ambulation Ambulation activity did not occur: Safety/medical concerns         Wheelchair   Type: Manual Max wheelchair distance: 114f  Assist Level: Touching or steadying assistance (Pt > 75%)  Cognition Comprehension Comprehension assist level: Follows basic conversation/direction with no assist   Expression Expression assist level: Expresses basic needs/ideas: With no assist  Social Interaction Social Interaction assist level: Interacts appropriately 90% of the time - Needs monitoring or encouragement for participation or interaction.  Problem Solving Problem solving assist level: Solves basic problems with no assist  Memory Memory assist level: Recognizes or recalls 90% of the time/requires cueing < 10% of the time    Medical Problem List and Plan: 1.  Decreased functional mobility secondary to left BKA secondary to peripheral vascular disease 07/06/2017. Pt s/p arteriogram, transferred off unit post-procedure. Now back to rehab.    -PT, OT CIR level 2.  DVT Prophylaxis/Anticoagulation: Subcutaneous heparin. Monitor for any bleeding episodes 3. Pain Management: using tramadol given poor opioid tolerance.  4. Mood: Provide emotional support 5. Neuropsych: This patient is not consistently capable of making decisions on her own behalf. 6. Skin/Wound Care: Routine skin checks 7. Fluids/Electrolytes/Nutrition: Routine I/O's    Resume remeron for sleep/appetite 8.  CKD stage III.  Creatinine baseline 1.30-1.59.     Creatinine 1.24 on 3/25   Encourage fluids   Continue to monitor 9.  Hypertension.  Norvasc 10 mg daily, (HCTZ 12.5 mg twice daily on HOLD. )     Monitor with increased mobility   Cozaar 50 mg qd    Vitals:   07/16/17 1535 07/17/17 0321  BP: (!) 130/57 (!) 145/67  Pulse: 80 81  Resp: 19 18  Temp: 98.5 F (36.9 C) 98.1 F (36.7 C)  SpO2: 98% 97%  Cont to monitor 3/31  10.  History of CVA.  Continue Plavix. 11.  Diet controlled diabetes mellitus.  Hemoglobin A1c 6.0.      No need for CBG  12. Obesity   Body mass index is 28.75 kg/m.   Diet and exercise education   Encourage weight loss to increase endurance and promote overall health 13. Hypokalemia   K+ 3.3 on 3/25, improved to 4.3 on 3/30   Labs ordered for tomorrow   Supplemented x2 days 14. Drug  induced constipation- had BM yesterday      Family notes combo of 2 Senna S and prune juice work well 15. Sleep disturbance- slept very well yesterday   Remeron initiated on 3/27   Melatonin 1.568mstarted on 3/27 16.  UTI will cont keflex until cx result available LOS (Days) 2 A FACE TO FACE EVALUATION WAS PERFORMED  AnCharlett Blake/31/2019 8:04 AM

## 2017-07-17 NOTE — Plan of Care (Signed)
  Problem: RH BLADDER ELIMINATION Goal: RH STG MANAGE BLADDER WITH ASSISTANCE Description STG Manage Bladder With Assistance with mod assist  Outcome: Not Progressing Goal: RH STG MANAGE BLADDER WITH EQUIPMENT WITH ASSISTANCE Description STG Manage Bladder With Equipment With Assistance with mod assist  Outcome: Not Progressing   Requires max assist with In/Out cath

## 2017-07-18 ENCOUNTER — Inpatient Hospital Stay (HOSPITAL_COMMUNITY): Payer: Medicare Other | Admitting: Occupational Therapy

## 2017-07-18 ENCOUNTER — Inpatient Hospital Stay (HOSPITAL_COMMUNITY): Payer: Medicare Other | Admitting: Physical Therapy

## 2017-07-18 DIAGNOSIS — N390 Urinary tract infection, site not specified: Secondary | ICD-10-CM

## 2017-07-18 DIAGNOSIS — G479 Sleep disorder, unspecified: Secondary | ICD-10-CM

## 2017-07-18 MED ORDER — GABAPENTIN 100 MG PO CAPS
100.0000 mg | ORAL_CAPSULE | Freq: Three times a day (TID) | ORAL | Status: DC
  Administered 2017-07-18 – 2017-07-29 (×33): 100 mg via ORAL
  Filled 2017-07-18 (×33): qty 1

## 2017-07-18 NOTE — H&P (Signed)
Please see previous H&P.  Patient is an interrupted stay, briefly on another unit s/p arteriogram.

## 2017-07-18 NOTE — IPOC Note (Signed)
Please see previous IPOC.  Patient is an interrupted stay, briefly on another unit s/p arteriogram.

## 2017-07-18 NOTE — Progress Notes (Signed)
Physical Therapy Session Note  Patient Details  Name: [name] Petersen MRN: [id number] Date of Birth: [birth year]  Today's Date: 07/18/2017 PT Individual Time: 0712-1975 and 1330-1445  PT Individual Time Calculation (min): 69 min and 75 min Short Term Goals: Week 1:  PT Short Term Goal 1 (Week 1): pt will roll L >< R with mod assist PT Short Term Goal 2 (Week 1): pt will move supine> sit with mod assist PT Short Term Goal 3 (Week 1): pt will transfer w/c>< bed wiht assist of 1 person PT Short Term Goal 4 (Week 1): pt will propel w/c x 50' with min assist PT Short Term Goal 5 (Week 1): pt will state 3 components of basic transfer  Skilled Therapeutic Interventions/Progress Updates: Tx1: Pt presented in w/c agreeable to therapy. Nsg arrived to assess pain due to current change in meds, per pt 6/10 at residual limb feeling like "throbbing" Pt indicating feeling extremely sleepy associating with pain meds. Pt transported to day room and performed UBE x 5 min for endurance. Pt able to perform with x 1 break however performed at slow pace. Pt transported to rehab gym and participated in UE/LE seated therex. Performed chest press, shoulder flexion, forward/backward circles, bicep curls, tricep extension with 2# dowel x 10 bilaterally.Performed SB transfer to mat to participate in LE therex, pt continues to require mod cues for increasing anterior lean and improving head/hips relationship. Performed RLE LAQ and hip flexion with 1.5# cuff, and LLE knee extension x 10. Pt required frequent breaks due to fatigue and lethargy. Pt propelled w/c 130f with minA for significant R drift requiring mod verbal cues for correction. Pt returned to room and performed SB transfer to bed minA with cues for lateral push vs upward push on SB. Pt was able to pull self up with [facility] Pt positioned to comfort in bed and left with bed alarm on, needs met, and dgt present.   Tx2: Pt presented in bed agreeable to  therapy. Pt performed supine to sit minA with use of bed rail. Performed SB transfer to L with minA and max verbal cues for head/hips. Pt transferred to rehab gym and performed sit to stand in parallel bars with pt pulling self up with bars x 4. Attempted to increase standing tolerance with pt performed hip flexion ext x 5-10. Pt c/o increased LLE pain when performing hip extension. Performed standing outside of parallel bars with cues for pt to push from w/c. Pt participated in stacking cups in standing x 2 for reaching and use of single UE with not LOB noted. Performed SB transfer to mat minA and performed SLR with LLE and sidelying on R hip ext and hip abd. Supine to sit from flat surface modA due to decreased truncal support. Pt transported back to room and performed SB transfer to bed minA. Use of features to return to supine. Daughter (Santiago Glad) present and provided w/c measurement sheet. Discussed with dgt home set up and dgt advised ramp being installed soon. Pt remained in bed at end of session with bed alarm on, call bell within reach and needs met.      Therapy Documentation Precautions:  Precautions Precautions: Fall Restrictions Weight Bearing Restrictions: No LLE Weight Bearing: Non weight bearing General:   Vital Signs: Therapy Vitals Pulse Rate: 81 Resp: 18 BP: (!) 130/56 Patient Position (if appropriate): Lying Oxygen Therapy SpO2: 97 % O2 Device: Room Air Pain: Pain Assessment Pain Scale: 0-10 Pain Score: 6  See Function Navigator for Current Functional Status.   Therapy/Group: Individual Therapy  [name] Petersen  [name] Petersen, PTA  07/18/2017, 12:05 PM

## 2017-07-18 NOTE — Progress Notes (Signed)
Occupational Therapy Session Note  Patient Details  Name: [name] Petersen MRN: [id number] Date of Birth: 04/02/1927  Today's Date: 07/18/2017 OT Individual Time: 7829-56210830-0930 OT Individual Time Calculation (min): 60 min    Short Term Goals: Week 1:  OT Short Term Goal 1 (Week 1): Pt will complete bathing with mod assist OT Short Term Goal 2 (Week 1): Pt will complete LB dressing with max assist of 1 caregiver OT Short Term Goal 3 (Week 1): Pt will complete toilet transfer with MOD assist of 1 caregiver  Skilled Therapeutic Interventions/Progress Updates:    Treatment session with focus on activity tolerance, transfers, and BUE strengthening.  Pt received supine in bed with RN present changing dressing on Rt heel.  Educated on limb wrapping with therapist re-wrapping Lt residual limb in figure 8 pattern to ensure proper fit for shaping.  Pt declined bathing/dressing stating that she had just put on her outfit last night - agreeable to bathing/dressing tomorrow AM.  Pt completed bed mobility with min assist to increase weight shift at Lt hip for improved sitting balance at EOB.  Engaged in 1 set of 10 chest presses and overhead presses with 2# medicine ball, pt reports difficulty with overhead press therefore modified and completed 2 more set of 10 chest presses with medicine ball.  Slide board transfer bed > w/c with mod assist and max cues for weight shift and hand placement to increase weight shifting and safety.  Pt engaged in grooming tasks seated at sink with setup to obtain items.  Engaged in 2 sets of 10 chest presses, bicep curls, and lateral rows with 2# dowel rod seated in w/c for back support and improved positioning of LLE.  Pt left upright in w/c with daughter present.  Therapy Documentation Precautions:  Precautions Precautions: Fall Restrictions Weight Bearing Restrictions: No LLE Weight Bearing: Non weight bearing General:   Vital Signs: Therapy Vitals Pulse Rate: 81 Resp:  18 BP: (!) 130/56 Patient Position (if appropriate): Lying Oxygen Therapy SpO2: 97 % O2 Device: Room Air Pain: Pain Assessment Pain Scale: 0-10 Pain Score: 7  Pain Type: Acute pain Pain Location: Leg Pain Orientation: Left Pain Descriptors / Indicators: Aching Pain Frequency: Intermittent Pain Onset: On-going Patients Stated Pain Goal: 0 Pain Intervention(s): Medication (See eMAR)  See Function Navigator for Current Functional Status.   Therapy/Group: Individual Therapy  Rosalio LoudHOXIE, Adaleigh Warf 07/18/2017, 9:46 AM

## 2017-07-18 NOTE — Discharge Summary (Signed)
NAMCarlos Petersen:  Mattera, Cleopha                 ACCOUNT NO.:  [id number]  MEDICAL RECORD NO.:  [id number]  LOCATION:                                 FACILITY:  PHYSICIAN:  Maryla MorrowAnkit Patel, MD        DATE OF BIRTH:  June 07, 1926  DATE OF ADMISSION:  07/10/2017 DATE OF DISCHARGE:  07/14/2017                              DISCHARGE SUMMARY   Interrupted stay.  DISCHARGE DIAGNOSES: 1. Left below-knee amputation secondary to peripheral vascular     disease, July 06, 2017.  Subcutaneous heparin for deep vein     thrombosis prophylaxis.  Pain management. 2. Chronic kidney disease stage 3. 3. Hypertension. 4. History of cerebrovascular accident. 5. Diet-controlled diabetes mellitus. 6. Hypokalemia. 7. Drug-induced constipation. 8. Sleep disturbance.  HISTORY OF PRESENT ILLNESS:  This is an 84 year old right-handed female with history of CVA, on Plavix, hypertension, CKD stage 3, independent. Prior to admission, she had been using a walker.  Presented for left BKA, July 06, 2017, per Dr. Arbie CookeyEarly due to un-reconstructible distal tibial disease.  HOSPITAL COURSE:  Pain management.  Monitoring of creatinine 1.97 from baseline of 1.30 to 1.59.  Her Cozaar was held.  Subcutaneous heparin for DVT prophylaxis.  The patient was admitted for a comprehensive rehab program.  PAST MEDICAL HISTORY:  See discharge diagnoses.  SOCIAL HISTORY:  Lives alone.  Was using a walker.  Functional status upon admission to Rehab Services was mod to max assist for mobility.  PHYSICAL EXAMINATION:  VITAL SIGNS:  Blood pressure 154/69, pulse 100, temperature 98, and respirations 16. GENERAL:  EOM is intact. NECK:  Supple.  Nontender.  No JVD. CARDIAC:  Rate controlled. ABDOMEN:  Soft and nontender.  Good bowel sounds.  Amputation site was dressed appropriately, tender.  The patient lethargic but arousable.  REHABILITATION HOSPITAL COURSE:  The patient was admitted to Inpatient Rehab Services.  Therapy was initiated on a  3-hour daily basis consisting of physical therapy, occupational therapy, and rehabilitation nursing.  Following issues were addressed during the patient's rehabilitation stay.  __________ Ms Chenevert's left BKA secondary to peripheral vascular disease remained stable.  She had initially been discharged overnight for arteriogram per Vascular Surgery.  [facility]  Pain management with the use of tramadol, noted poor opioid tolerance, subcutaneous heparin for DVT prophylaxis, CKD stage 3, creatinine 1.24.  Blood pressure is controlled and monitored. History of CVA, maintained on Plavix.  Hemoglobin A1c, diet-controlled diabetes mellitus.  Hypokalemia with supplement added.  Drug-induced constipation resolved with laxative assistance.  The patient was receiving weekly collaborative interdisciplinary team conferences, limited due to recent arteriogram.  She was readmitted back to rehab services for ongoing therapies.  Medication regimen was resumed.     Mariam Dollaraniel Karima Carrell, P.A.   ______________________________ Maryla MorrowAnkit Patel, MD    DA/MEDQ  D:  07/15/2017  T:  07/16/2017  Job:  [id number]

## 2017-07-18 NOTE — Progress Notes (Signed)
[facility] PHYSICAL MEDICINE & REHABILITATION     PROGRESS NOTE  Subjective/Complaints:  Pt seen sitting up in bed this AM.  She slept better overnight. Daughter at bedside needs emotional reassurance.   ROS: Denies CP, SOB, N/V/D.   Objective: Vital Signs: Blood pressure (!) 130/56, pulse 81, temperature 98.2 F (36.8 C), temperature source Oral, resp. rate 18, height 5\' 2"  (1.575 m), weight 71.3 kg (157 lb 3 oz), SpO2 97 %. No results found. Recent Labs    07/15/17 1630 07/16/17 0619  WBC 17.7* 17.9*  HGB 11.0* 11.2*  HCT 34.8* 34.6*  PLT 268 278   Recent Labs    07/15/17 1630 07/16/17 0619  NA  --  135  K  --  4.3  CL  --  98*  GLUCOSE  --  113*  BUN  --  22*  CREATININE 1.33* 1.37*  CALCIUM  --  9.1   CBG (last 3)  No results for input(s): GLUCAP in the last 72 hours.  Wt Readings from Last 3 Encounters:  07/15/17 71.3 kg (157 lb 3 oz)  07/15/17 71.3 kg (157 lb 3 oz)  07/11/17 76 kg (167 lb 8.8 oz)    Physical Exam:  BP (!) 130/56 (BP Location: Left Arm)   Pulse 81   Temp 98.2 F (36.8 C) (Oral)   Resp 18   Ht 5\' 2"  (1.575 m)   Wt 71.3 kg (157 lb 3 oz)   SpO2 97%   BMI 28.75 kg/m  Constitutional: No distress . Vital signs reviewed.  HENT: Normocephalic, atraumatic. Eyes: EOMI, No discharge. Cardiovascular: RRR. No JVD    Respiratory: CTA Bilaterally. Normal effort    GI: BS +, Non-distended  Musculoskeletal: Left stump edema. Left BKA and right foot sensitive to touch. Neurological.   Alert Follows simple commands Motor: Bilateral upper extremities: 4+/5 proximal to distal Right lower extremity: Hip flexion, knee extension 4-4+/5, ankle dorsiflexion 5/5  Left lower extremity: Hip flexion 3-4/5 Skin: Incision with staples C/D/I Right heel ulcer just behind lateral malleolus Psych: Flat.  Assessment/Plan: 1. Functional deficits secondary to left BKA which require 3+ hours per day of interdisciplinary therapy in a comprehensive inpatient rehab  setting. Physiatrist is providing close team supervision and 24 hour management of active medical problems listed below. Physiatrist and rehab team continue to assess barriers to discharge/monitor patient progress toward functional and medical goals.  Function:  Bathing Bathing position Bathing activity did not occur: Refused    Bathing parts      Bathing assist        Upper Body Dressing/Undressing Upper body dressing Upper body dressing/undressing activity did not occur: Refused What is the patient wearing?: Pull over shirt/dress     Pull over shirt/dress - Perfomed by patient: Thread/unthread right sleeve, Thread/unthread left sleeve, Put head through opening, Pull shirt over trunk          Upper body assist        Lower Body Dressing/Undressing Lower body dressing Lower body dressing/undressing activity did not occur: Refused What is the patient wearing?: Non-skid slipper socks, Pants     Pants- Performed by patient: Thread/unthread right pants leg, Thread/unthread left pants leg Pants- Performed by helper: Pull pants up/down Non-skid slipper socks- Performed by patient: Don/doff right sock                    Lower body assist        Toileting Toileting Toileting activity did not occur: No  continent bowel/bladder event   Toileting steps completed by helper: Adjust clothing prior to toileting, Performs perineal hygiene, Adjust clothing after toileting    Toileting assist Assist level: Two helpers   Transfers Chair/bed transfer   Chair/bed transfer method: Lateral scoot Chair/bed transfer assist level: Moderate assist (Pt 50 - 74%/lift or lower) Chair/bed transfer assistive device: Armrests, Sliding board     Locomotion Ambulation Ambulation activity did not occur: Safety/medical concerns         Wheelchair   Type: Manual Max wheelchair distance: 14000ft  Assist Level: Touching or steadying assistance (Pt > 75%)  Cognition Comprehension  Comprehension assist level: Follows basic conversation/direction with no assist  Expression Expression assist level: Expresses basic needs/ideas: With no assist  Social Interaction Social Interaction assist level: Interacts appropriately 90% of the time - Needs monitoring or encouragement for participation or interaction.  Problem Solving Problem solving assist level: Solves basic problems with no assist  Memory Memory assist level: Recognizes or recalls 90% of the time/requires cueing < 10% of the time    Medical Problem List and Plan: 1.  Decreased functional mobility secondary to left BKA secondary to peripheral vascular disease 07/06/2017. Pt s/p arteriogram, transferred off unit post-procedure. Now back to rehab.    Cont CIR 2.  DVT Prophylaxis/Anticoagulation: Subcutaneous heparin. Monitor for any bleeding episodes 3. Pain Management: using tramadol given poor opioid tolerance.  4. Mood: Provide emotional support 5. Neuropsych: This patient is not consistently capable of making decisions on her own behalf. 6. Skin/Wound Care: Routine skin checks 7. Fluids/Electrolytes/Nutrition: Routine I/O's    Resumed remeron for sleep/appetite 8.  CKD stage III.  Creatinine baseline 1.30-1.59.     Creatinine 1.37 on 3/30   Encourage fluids   Continue to monitor 9.  Hypertension.  Norvasc 10 mg daily, (HCTZ 12.5 mg twice daily on HOLD. )     Monitor with increased mobility   Cozaar 50 mg qd    Vitals:   07/18/17 0235 07/18/17 0817  BP: (!) 149/73 (!) 130/56  Pulse: 82 81  Resp: 18 18  Temp: 98.2 F (36.8 C)   SpO2: 98% 97%    Labile on 4/1 10.  History of CVA.  Continue Plavix. 11.  Diet controlled diabetes mellitus.  Hemoglobin A1c 6.0.      No need for CBGs 12. Obesity   Body mass index is 28.75 kg/m.   Diet and exercise education   Encourage weight loss to increase endurance and promote overall health 13. Hypokalemia   K+ 4.3 on 3/30   Supplemented x2 days 14. Drug induced  constipation   Improving   Family notes combo of 2 Senna S and prune juice work well 15. Sleep disturbance   Remeron initiated on 3/27   Melatonin 1.5mg  started on 3/27 16.  Acute lower UTI    Cont empiric keflex, cx sensitivities pending  LOS (Days) 3 A FACE TO FACE EVALUATION WAS PERFORMED  Connie Lasater Karis Jubanil Marguerite Barba 07/18/2017 9:12 AM

## 2017-07-18 NOTE — Progress Notes (Signed)
Patient ID: [name] Petersen, female   DOB: 10/21/1926, 84 y.o.   MRN: [id number] Sitting up in her wheelchair.  Daughter is present. I removed her BKA dressing which is healing.  Bruising is resolving. Her right heel blister is continuing to demarcate with the darkness now.  The skin is intact.  Is approximately 3 cm in diameter.  Continue physical therapy and continue to keep pressure off right heel while she is in bed

## 2017-07-18 NOTE — Plan of Care (Signed)
  Problem: RH PAIN MANAGEMENT Goal: RH STG PAIN MANAGED AT OR BELOW PT'S PAIN GOAL Description Pt rate <=3/10  Outcome: Progressing   Problem: RH PAIN MANAGEMENT Goal: Pain level will decrease with appropriate interventions Description Pain will be <= 3/10  Outcome: Progressing  New pain regimen Gabapentin starts @ 07/18/17 @ 1400.

## 2017-07-18 NOTE — Progress Notes (Signed)
Granddaughter, Shanda BumpsJessica was able to perform clean technique in and out cath. Shanda BumpsJessica would like more hands on. Shanda BumpsJessica states, "I'm here most weekends because of my work schedule."   Clinical research associateWriter, daughter, and granddaughter went over plan of care with pain regimen.

## 2017-07-19 ENCOUNTER — Inpatient Hospital Stay (HOSPITAL_COMMUNITY): Payer: Medicare Other | Admitting: Occupational Therapy

## 2017-07-19 ENCOUNTER — Inpatient Hospital Stay (HOSPITAL_COMMUNITY): Payer: Medicare Other | Admitting: Physical Therapy

## 2017-07-19 ENCOUNTER — Encounter (HOSPITAL_COMMUNITY): Payer: Self-pay | Admitting: *Deleted

## 2017-07-19 DIAGNOSIS — K5901 Slow transit constipation: Secondary | ICD-10-CM

## 2017-07-19 LAB — URINE CULTURE: Culture: >=100000 — AB

## 2017-07-19 LAB — BASIC METABOLIC PANEL
Anion gap: 11 (ref 5–15)
BUN: 18 mg/dL (ref 6–20)
CO2: 25 mmol/L (ref 22–32)
Calcium: 8.8 mg/dL — ABNORMAL LOW (ref 8.9–10.3)
Chloride: 100 mmol/L — ABNORMAL LOW (ref 101–111)
Creatinine, Ser: 1.17 mg/dL — ABNORMAL HIGH (ref 0.44–1.00)
GFR calc Af Amer: 47 mL/min — ABNORMAL LOW (ref >60)
GFR calc non Af Amer: 40 mL/min — ABNORMAL LOW (ref >60)
Glucose, Bld: 92 mg/dL (ref 65–99)
Potassium: 4.4 mmol/L (ref 3.5–5.1)
Sodium: 136 mmol/L (ref 135–145)

## 2017-07-19 MED ORDER — POLYETHYLENE GLYCOL 3350 17 G PO PACK
17.0000 g | PACK | Freq: Two times a day (BID) | ORAL | Status: DC
  Administered 2017-07-19 – 2017-07-22 (×6): 17 g via ORAL
  Filled 2017-07-19 (×17): qty 1

## 2017-07-19 MED ORDER — ENOXAPARIN SODIUM 30 MG/0.3ML ~~LOC~~ SOLN
30.0000 mg | SUBCUTANEOUS | Status: DC
  Administered 2017-07-19 – 2017-07-29 (×11): 30 mg via SUBCUTANEOUS
  Filled 2017-07-19 (×11): qty 0.3

## 2017-07-19 MED ORDER — LEVOFLOXACIN 250 MG PO TABS
250.0000 mg | ORAL_TABLET | Freq: Every day | ORAL | Status: AC
  Administered 2017-07-19 – 2017-07-21 (×3): 250 mg via ORAL
  Filled 2017-07-19 (×3): qty 1

## 2017-07-19 NOTE — Progress Notes (Signed)
Physical Therapy Session Note  Patient Details  Name: [name] Petersen MRN: [id number] Date of Birth: Sep 03, 1926  Today's Date: 07/19/2017 PT Individual Time: 1100-1200 PT Individual Time Calculation (min): 60 min   Short Term Goals: Week 1:  PT Short Term Goal 1 (Week 1): pt will roll L >< R with mod assist PT Short Term Goal 2 (Week 1): pt will move supine> sit with mod assist PT Short Term Goal 3 (Week 1): pt will transfer w/c>< bed wiht assist of 1 person PT Short Term Goal 4 (Week 1): pt will propel w/c x 50' with min assist PT Short Term Goal 5 (Week 1): pt will state 3 components of basic transfer  Skilled Therapeutic Interventions/Progress Updates: PT presented in w/c a with dgt present agreeable to therapy. Pt propelled w/c to rehab gym with min guard with verbal cues for increased use of RUE to maintain straight trajectory. Performed SB transfer to mat minA due to decreased head/hip relationship. Provided pt edu on importance of maintaining knee extension due to pt's constant posturing of slight knee flexion. Pt participated in residual limb knee extension, passive stretch of residual limb laying flat on mat, SLR x 10, L SAQ, and bridges x 10 ea. Performed supine to sit from flat mat with modA due to pt attempting to perform sit up vs rolling to side. Performed sit to/from stand x3 with modA, cues for hand placement. With each attempt encouraged pt to maintain standing tolerance for increased time. Pt able to maintain standing for up to 1 min. Pt returned to w/c via SB with pt able to place SB with minA and performed transfer with min guard. Pt transported back to room and remained in w/c with lunch tray set up and needs met.      Therapy Documentation Precautions:  Precautions Precautions: Fall Restrictions Weight Bearing Restrictions: No LLE Weight Bearing: Non weight bearing  See Function Navigator for Current Functional Status.   Therapy/Group: Individual Therapy  [name]  Orvile Corona, PTA  07/19/2017, 12:09 PM

## 2017-07-19 NOTE — Progress Notes (Signed)
Patient arrived to unit for admission, appeared alert and oriented with no complaints of pain.

## 2017-07-19 NOTE — Progress Notes (Signed)
[facility] PHYSICAL MEDICINE & REHABILITATION     PROGRESS NOTE  Subjective/Complaints:  Pt seen lying in bed this AM.  She better overnight.  She requests a laxative.  Daughter has questions about oral DVT prophylaxis.   ROS: Denies CP, SOB, N/V/D.   Objective: Vital Signs: Blood pressure (!) 155/69, pulse 78, temperature 97.8 F (36.6 C), temperature source Oral, resp. rate 16, height 5\' 2"  (1.575 m), weight 71.3 kg (157 lb 3 oz), SpO2 95 %. No results found. No results for input(s): WBC, HGB, HCT, PLT in the last 72 hours. Recent Labs    07/19/17 0700  NA 136  K 4.4  CL 100*  GLUCOSE 92  BUN 18  CREATININE 1.17*  CALCIUM 8.8*   CBG (last 3)  No results for input(s): GLUCAP in the last 72 hours.  Wt Readings from Last 3 Encounters:  07/15/17 71.3 kg (157 lb 3 oz)  07/15/17 71.3 kg (157 lb 3 oz)  07/11/17 76 kg (167 lb 8.8 oz)    Physical Exam:  BP (!) 155/69 (BP Location: Left Arm)   Pulse 78   Temp 97.8 F (36.6 C) (Oral)   Resp 16   Ht 5\' 2"  (1.575 m)   Wt 71.3 kg (157 lb 3 oz)   SpO2 95%   BMI 28.75 kg/m  Constitutional: No distress . Vital signs reviewed.  HENT: Normocephalic, atraumatic. Eyes: EOMI, No discharge. Cardiovascular: RRR. No JVD    Respiratory: CTA Bilaterally. Normal effort    GI: BS +, Non-distended  Musculoskeletal: Left stump edema. Left BKA and right foot sensitive to touch. Neurological.   Alert Follows simple commands Motor: Bilateral upper extremities: 4+/5 proximal to distal Right lower extremity: Hip flexion, knee extension 4-4+/5, ankle dorsiflexion 5/5  Left lower extremity: Hip flexion 4--4/5  Skin: Incision with dressing C/D/I Right heel ulcer just behind lateral malleolus with necrosis Psych: Flat.  Assessment/Plan: 1. Functional deficits secondary to left BKA which require 3+ hours per day of interdisciplinary therapy in a comprehensive inpatient rehab setting. Physiatrist is providing close team supervision and 24  hour management of active medical problems listed below. Physiatrist and rehab team continue to assess barriers to discharge/monitor patient progress toward functional and medical goals.  Function:  Bathing Bathing position Bathing activity did not occur: Refused    Bathing parts      Bathing assist        Upper Body Dressing/Undressing Upper body dressing Upper body dressing/undressing activity did not occur: Refused What is the patient wearing?: Pull over shirt/dress     Pull over shirt/dress - Perfomed by patient: Thread/unthread right sleeve, Thread/unthread left sleeve, Put head through opening, Pull shirt over trunk          Upper body assist        Lower Body Dressing/Undressing Lower body dressing Lower body dressing/undressing activity did not occur: Refused What is the patient wearing?: Non-skid slipper socks, Pants     Pants- Performed by patient: Thread/unthread right pants leg, Thread/unthread left pants leg Pants- Performed by helper: Pull pants up/down Non-skid slipper socks- Performed by patient: Don/doff right sock                    Lower body assist        Toileting Toileting Toileting activity did not occur: No continent bowel/bladder event   Toileting steps completed by helper: Adjust clothing prior to toileting, Performs perineal hygiene, Adjust clothing after toileting    Toileting assist  Assist level: Two helpers   Transfers Chair/bed transfer   Chair/bed transfer method: Lateral scoot Chair/bed transfer assist level: Moderate assist (Pt 50 - 74%/lift or lower) Chair/bed transfer assistive device: Armrests, Sliding board     Locomotion Ambulation Ambulation activity did not occur: Safety/medical concerns         Wheelchair   Type: Manual Max wheelchair distance: 173ft  Assist Level: Touching or steadying assistance (Pt > 75%)  Cognition Comprehension Comprehension assist level: Follows basic conversation/direction with no  assist  Expression Expression assist level: Expresses basic needs/ideas: With no assist  Social Interaction Social Interaction assist level: Interacts appropriately 90% of the time - Needs monitoring or encouragement for participation or interaction.  Problem Solving Problem solving assist level: Solves basic problems with no assist  Memory Memory assist level: Recognizes or recalls 90% of the time/requires cueing < 10% of the time    Medical Problem List and Plan: 1.  Decreased functional mobility secondary to left BKA secondary to peripheral vascular disease 07/06/2017. Pt s/p arteriogram, transferred off unit post-procedure. Now back to rehab.    Cont CIR 2.  DVT Prophylaxis/Anticoagulation: Subcutaneous heparin, changed to Lovenox on 4/2.  Monitor for any bleeding episodes 3. Pain Management: using tramadol given poor opioid tolerance.    Gabapentin 100 TID started on 4/1 4. Mood: Provide emotional support 5. Neuropsych: This patient is not consistently capable of making decisions on her own behalf. 6. Skin/Wound Care: Routine skin checks   Appreciate Vascular recs, no changes at present  7. Fluids/Electrolytes/Nutrition: Routine I/O's    Resumed remeron for sleep/appetite 8.  CKD stage III.  Creatinine baseline 1.30-1.59.     Creatinine 1.17 on 4/2   Encourage fluids   Continue to monitor 9.  Hypertension.  Norvasc 10 mg daily, (HCTZ 12.5 mg twice daily on HOLD. )     Monitor with increased mobility   Cozaar 50 mg qd    Vitals:   07/18/17 1511 07/19/17 0232  BP: 129/61 (!) 155/69  Pulse: 80 78  Resp: 17 16  Temp: 98.2 F (36.8 C) 97.8 F (36.6 C)  SpO2: 98% 95%    Labile on 4/2 10.  History of CVA.  Continue Plavix. 11.  Diet controlled diabetes mellitus.  Hemoglobin A1c 6.0.      No need for CBGs 12. Obesity   Body mass index is 28.75 kg/m.   Diet and exercise education   Encourage weight loss to increase endurance and promote overall health 13. Hypokalemia:  Resolved after supplementation 14. Drug induced constipation   Improving   Family notes combo of 2 Senna S and prune juice work well   Increased reg on 4/2 15. Sleep disturbance   Remeron initiated on 3/27   Melatonin 1.5mg  started on 3/27 16.  Acute lower UTI    Ucx with Proteus and Pseudomonas  Will discuss with pharmacy   Cont Keflex, will consider addition/change of abx  LOS (Days) 4 A FACE TO FACE EVALUATION WAS PERFORMED  [name] Petersen Juba 07/19/2017 8:22 AM

## 2017-07-19 NOTE — Progress Notes (Signed)
Physical Therapy Session Note  Patient Details  Name: [name] Petersen MRN: [id number] Date of Birth: 28-Jun-1926  Today's Date: 07/19/2017 PT Individual Time: 4967-5916 PT Individual Time Calculation (min): 30 min   Short Term Goals: Week 1:  PT Short Term Goal 1 (Week 1): pt will roll L >< R with mod assist PT Short Term Goal 2 (Week 1): pt will move supine> sit with mod assist PT Short Term Goal 3 (Week 1): pt will transfer w/c>< bed wiht assist of 1 person PT Short Term Goal 4 (Week 1): pt will propel w/c x 50' with min assist PT Short Term Goal 5 (Week 1): pt will state 3 components of basic transfer  Skilled Therapeutic Interventions/Progress Updates: Pt presented in bed with nsg present agreeable to therapy. Pt performed supine to sit with use of bed rail with increased time and min guard. Pt required min verbal cues for correct placement of SB and performed SB to L with min assist and increased time. Pt transported to day room and participated in UBE x 5 min for endurance, total with x 4 brief rest breaks. Pt propelled w/c x 130f with supervision and decreased speed. Pt returned to room and performed SB transfer to bed in same manner as prior. Pt positioned to comfort and left in bed with dgt present and needs met.      Therapy Documentation Precautions:  Precautions Precautions: Fall Restrictions Weight Bearing Restrictions: No LLE Weight Bearing: Non weight bearing General:   Vital Signs: Therapy Vitals Temp: 97.9 F (36.6 C) Temp Source: Oral Pulse Rate: 78 Resp: 18 BP: 131/62 Patient Position (if appropriate): Lying Oxygen Therapy SpO2: 96 % O2 Device: Room Air Pain: Pain Assessment Pain Scale: 0-10 Pain Score: 5  Pain Type: Surgical pain Pain Location: Leg Pain Orientation: Left Pain Descriptors / Indicators: Aching Pain Frequency: Intermittent Pain Onset: On-going Patients Stated Pain Goal: 2 Pain Intervention(s): Medication (See  eMAR);Repositioned Multiple Pain Sites: No   See Function Navigator for Current Functional Status.   Therapy/Group: Individual Therapy  Nuh Lipton  Cristan Scherzer, PTA  07/19/2017, 4:08 PM

## 2017-07-19 NOTE — Progress Notes (Signed)
Pharmacy Antibiotic Note  [name] Petersen is a 84 y.o. female admitted on 07/15/2017 with UTI.  Pharmacy has been consulted for Levaquin dosing.  Plan: Discontinue Keflex Start Levaquin 250 mg PO q 24 hours Monitor clinical progress, cultures/sensitivities, renal function, abx plan  Height: 5\' 2"  (157.5 cm) Weight: 157 lb 3 oz (71.3 kg) IBW/kg (Calculated) : 50.1  Temp (24hrs), Avg:98 F (36.7 C), Min:97.8 F (36.6 C), Max:98.2 F (36.8 C)  Recent Labs  Lab 07/14/17 1251 07/15/17 1630 07/16/17 0619 07/19/17 0700  WBC  --  17.7* 17.9*  --   CREATININE 1.00 1.33* 1.37* 1.17*    Estimated Creatinine Clearance: 30.7 mL/min (A) (by C-G formula based on SCr of 1.17 mg/dL (H)).    Allergies  Allergen Reactions  . Scopace [Scopolamine] Other (See Comments)    Hallucinations    Antimicrobials this admission: 3/30 Keflex >> 4/2 4/2 Levaquin >>   Microbiology results: 3/29 UCx: >=100,000 COLONIES/mL PSEUDOMONAS AERUGINOSA  >=100,000 COLONIES/mL PROTEUS MIRABILIS    Thank you for allowing us to participate in this patients care.  Signe Coltonya C Arah Aro, PharmD Clinical phone for 07/19/2017 from 7a-3:30p: [phone] If after 3:30p, please call main pharmacy at: [phone] 07/19/2017 12:13 PM

## 2017-07-19 NOTE — Progress Notes (Signed)
Occupational Therapy Session Note  Patient Details  Name: [name] Petersen MRN: [id number] Date of Birth: 05/26/1926  Today's Date: 07/19/2017 OT Individual Time: 1005-1100 and 1300-1345 OT Individual Time Calculation (min): 55 min and 45 min   Short Term Goals: Week 1:  OT Short Term Goal 1 (Week 1): Pt will complete bathing with mod assist OT Short Term Goal 2 (Week 1): Pt will complete LB dressing with max assist of 1 caregiver OT Short Term Goal 3 (Week 1): Pt will complete toilet transfer with MOD assist of 1 caregiver  Skilled Therapeutic Interventions/Progress Updates:    1) Treatment session with focus on self-care retraining and management of residual limb.  Pt received supine in bed willing to engage in bathing/dressing.  Completed LB bathing at bed level with rolling.  Pt required assist to thread BLE but able to pull pants over hips with rolling.  Engaged in UB bathing/dressing seated at EOB with min guard for sitting balance.  Slide board transfer to Rt with assist to place board and then mod assist transfer.  Max cues for head/hips relationship as pt continues to attempt to lean and push backwards during transfers.  Grooming tasks completed in sitting at sink with setup for items.  Therapist re-wrapped residual limb in figure 8 pattern.  Educated on importance of knee extension to maintain ROM and in preparation for prosthesis.  Pt left upright in w/c with daughter present.  2) Treatment session with focus on transfers and sit <> stand.  Pt received seated on standard toilet with nurse tech present and pt's daughter concerned about pt safety with toilet transfer.  Pt required Max assist sit > stand from low toilet and +2 for safety for stand pivot transfer off toilet to w/c.  Discussed current recommendation for toilet transfers is to drop arm BSC with slide board, therefore completed transfer with slide board to drop arm BSC with pt with mod assist while family observed.  Engaged in sit  > stand x3 at sink with use of mirror to promote upright standing posture.  Min-mod assist for sit > stand with therapist blocking Rt knee for stability and safety.  Returned to bed at end of session, pt reporting increased fatigue, slide board with mod assist as pt tending to lean backward due to fatigue.  Pt left positioned semi-reclined in bed with all needs in reach.  Therapy Documentation Precautions:  Precautions Precautions: Fall Restrictions Weight Bearing Restrictions: No LLE Weight Bearing: Non weight bearing Pain:  Pt with no c/o pain  See Function Navigator for Current Functional Status.   Therapy/Group: Individual Therapy  Rosalio LoudHOXIE, Yasmine Kilbourne 07/19/2017, 12:44 PM

## 2017-07-19 NOTE — Progress Notes (Signed)
[name] Petersen is returning to CIR as an interrupted stay.  Observation 07/14/17 through 07/15/17 past cath lab.  Please see encounter [phone] for H&P, PAPE, IPOC note and initial evaluations and plans of care.  Merge of accounts will occur past discharge.  Patient previously received Data Collection Information Summary.

## 2017-07-20 ENCOUNTER — Inpatient Hospital Stay (HOSPITAL_COMMUNITY): Payer: Medicare Other | Admitting: Physical Therapy

## 2017-07-20 ENCOUNTER — Inpatient Hospital Stay (HOSPITAL_COMMUNITY): Payer: Medicare Other | Admitting: Occupational Therapy

## 2017-07-20 DIAGNOSIS — S88112S Complete traumatic amputation at level between knee and ankle, left lower leg, sequela: Secondary | ICD-10-CM

## 2017-07-20 DIAGNOSIS — G546 Phantom limb syndrome with pain: Secondary | ICD-10-CM

## 2017-07-20 NOTE — Progress Notes (Signed)
[facility]     PROGRESS NOTE  Subjective/Complaints:  Patient seen lying in bed this morning. She states she slept better overnight. Daughter at bedside notes patient had a small bowel movement yesterday. Patient wants to have another bowel movement.  ROS: Denies CP, SOB, N/V/D.   Objective: Vital Signs: Blood pressure (!) 150/74, pulse 80, temperature 98 F (36.7 C), temperature source Oral, resp. rate 18, height 5\' 2"  (1.575 m), weight 71.3 kg (157 lb 3 oz), SpO2 98 %. No results found. No results for input(s): WBC, HGB, HCT, PLT in the last 72 hours. Recent Labs    07/19/17 0700  NA 136  K 4.4  CL 100*  GLUCOSE 92  BUN 18  CREATININE 1.17*  CALCIUM 8.8*   CBG (last 3)  No results for input(s): GLUCAP in the last 72 hours.  Wt Readings from Last 3 Encounters:  07/15/17 71.3 kg (157 lb 3 oz)  07/15/17 71.3 kg (157 lb 3 oz)  07/11/17 76 kg (167 lb 8.8 oz)    Physical Exam:  BP (!) 150/74 (BP Location: Left Arm)   Pulse 80   Temp 98 F (36.7 C) (Oral)   Resp 18   Ht 5\' 2"  (1.575 m)   Wt 71.3 kg (157 lb 3 oz)   SpO2 98%   BMI 28.75 kg/m  Constitutional: No distress . Vital signs reviewed.  HENT: Normocephalic, atraumatic. Eyes: EOMI, No discharge. Cardiovascular: RRR. No JVD    Respiratory: CTA Bilaterally. Normal effort    GI: BS +, Non-distended  Musculoskeletal: Left stump edema. Left BKA and right foot sensitive to touch. Neurological.   Alert Follows simple commands Motor:  Right lower extremity: Hip flexion, knee extension 4-4+/5, ankle dorsiflexion 5/5  Left lower extremity: Hip flexion 4--4/5 (stable) Skin: Incision with dressing C/D/I Right heel ulcer just behind lateral malleolus with necrosis, dressed Psych: Flat.  Assessment/Plan: 1. Functional deficits secondary to left BKA which require 3+ hours per day of interdisciplinary therapy in a comprehensive inpatient rehab setting. Physiatrist is providing close  team supervision and 24 hour management of active medical problems listed below. Physiatrist and rehab team continue to assess barriers to discharge/monitor patient progress toward functional and medical goals.  Function:  Bathing Bathing position Bathing activity did not occur: Refused Position: Bed(LB at bed level, UB seated EOB)  Bathing parts Body parts bathed by patient: Right arm, Left arm, Chest, Abdomen, Front perineal area, Right upper leg, Left upper leg Body parts bathed by helper: Buttocks, Right lower leg, Back  Bathing assist Assist Level: Touching or steadying assistance(Pt > 75%)      Upper Body Dressing/Undressing Upper body dressing Upper body dressing/undressing activity did not occur: Refused What is the patient wearing?: Pull over shirt/dress     Pull over shirt/dress - Perfomed by patient: Thread/unthread right sleeve, Thread/unthread left sleeve, Put head through opening, Pull shirt over trunk          Upper body assist Assist Level: Touching or steadying assistance(Pt > 75%)      Lower Body Dressing/Undressing Lower body dressing Lower body dressing/undressing activity did not occur: Refused What is the patient wearing?: Pants, Non-skid slipper socks     Pants- Performed by patient: Thread/unthread left pants leg, Pull pants up/down Pants- Performed by helper: Thread/unthread right pants leg Non-skid slipper socks- Performed by patient: Don/doff right sock Non-skid slipper socks- Performed by helper: Don/doff right sock  Lower body assist Assist for lower body dressing: (Mod assist)      Toileting Toileting Toileting activity did not occur: No continent bowel/bladder event   Toileting steps completed by helper: Adjust clothing prior to toileting, Performs perineal hygiene, Adjust clothing after toileting    Toileting assist Assist level: Two helpers   Transfers Chair/bed transfer   Chair/bed transfer method: Lateral  scoot Chair/bed transfer assist level: Moderate assist (Pt 50 - 74%/lift or lower) Chair/bed transfer assistive device: Armrests, Sliding board     Locomotion Ambulation Ambulation activity did not occur: Safety/medical concerns         Wheelchair   Type: Manual Max wheelchair distance: 16500ft  Assist Level: Touching or steadying assistance (Pt > 75%)  Cognition Comprehension Comprehension assist level: Follows basic conversation/direction with no assist  Expression Expression assist level: Expresses basic needs/ideas: With no assist  Social Interaction Social Interaction assist level: Interacts appropriately 75 - 89% of the time - Needs redirection for appropriate language or to initiate interaction.  Problem Solving Problem solving assist level: Solves basic problems with no assist  Memory Memory assist level: Recognizes or recalls 75 - 89% of the time/requires cueing 10 - 24% of the time    Medical Problem List and Plan: 1.  Decreased functional mobility secondary to left BKA secondary to peripheral vascular disease 07/06/2017. Pt s/p arteriogram, transferred off unit post-procedure. Now back to rehab.    Cont CIR 2.  DVT Prophylaxis/Anticoagulation: Subcutaneous heparin, changed to Lovenox on 4/2.  Monitor for any bleeding episodes 3. Pain Management: using tramadol given poor opioid tolerance.    Gabapentin 100 TID started on 4/1 4. Mood: Provide emotional support 5. Neuropsych: This patient is not consistently capable of making decisions on her own behalf. 6. Skin/Wound Care: Routine skin checks   Appreciate Vascular recs, no changes at present  7. Fluids/Electrolytes/Nutrition: Routine I/O's    Resumed remeron for sleep/appetite 8.  CKD stage III.  Creatinine baseline 1.30-1.59.     Creatinine 1.17 on 4/2   Encourage fluids   Continue to monitor 9.  Hypertension.  Norvasc 10 mg daily, (HCTZ 12.5 mg twice daily on HOLD. )     Monitor with increased mobility   Cozaar 50  mg qd    Vitals:   07/19/17 1500 07/20/17 0159  BP: 131/62 (!) 150/74  Pulse: 78 80  Resp: 18 18  Temp: 97.9 F (36.6 C) 98 F (36.7 C)  SpO2: 96% 98%    Slightly labile on 4/3 10.  History of CVA.  Continue Plavix. 11.  Diet controlled diabetes mellitus.  Hemoglobin A1c 6.0.      No need for CBGs 12. Obesity   Body mass index is 28.75 kg/m.   Diet and exercise education   Encourage weight loss to increase endurance and promote overall health 13. Hypokalemia: Resolved after supplementation 14. Drug induced constipation   Improving   Family notes combo of 2 Senna S and prune juice work   Increased reg on 4/2 with some improvement, will consider further decrease again tomorrow 15. Sleep disturbance   Remeron initiated on 3/27   Melatonin 1.5mg  started on 3/27 16.  Acute lower UTI    Ucx with Proteus and Pseudomonas  Will discuss with pharmacy   Keflex changed to Levaquin on 4/2-4/5  LOS (Days) 5 A FACE TO FACE EVALUATION WAS PERFORMED  Mansoor Hillyard Karis Jubanil Elver Stadler 07/20/2017 8:21 AM

## 2017-07-20 NOTE — Progress Notes (Signed)
Occupational Therapy Session Note  Patient Details  Name: [name] Petersen MRN: [id number] Date of Birth: 12/09/1926  Today's Date: 07/20/2017 OT Individual Time: 1010-1105 OT Individual Time Calculation (min): 55 min    Short Term Goals: Week 1:  OT Short Term Goal 1 (Week 1): Pt will complete bathing with mod assist OT Short Term Goal 2 (Week 1): Pt will complete LB dressing with max assist of 1 caregiver OT Short Term Goal 3 (Week 1): Pt will complete toilet transfer with MOD assist of 1 caregiver  Skilled Therapeutic Interventions/Progress Updates:    Treatment session with focus on BUE strengthening and dynamic standing balance during self-care tasks.  Pt received in bed being I/O cathed by nursing staff therefore missed initial 10 mins.  Pt declined bathing.  Donned pants at sit > stand level from EOB.  Min guard bed mobility to come to EOB followed by min-mod assist for sit > stand.  Pt able to pull pants over Lt hip but unable to release UE hold on bed rail to attempt to pull pants up on Rt, therapist providing blocking at RLE for stability in standing.  Slide board transfer to Lt with therapist positioning slide board and mod cues for weight shift and hand placement during transfer.  Engaged in grooming tasks and UB dressing seated at sink.  Engaged in BUE strengthening with 2# dowel rod completing 3 sets of 10 chest presses and bicep curls and 2 sets of 10 overhead presses with ball while wearing 1.5# wrist weights.  Pt propelled w/c 75' back to room for BUE strengthening and endurance.  Passed off to PT.   Therapy Documentation Precautions:  Precautions Precautions: Fall Restrictions Weight Bearing Restrictions: Yes LLE Weight Bearing: Non weight bearing Pain:  Pt with c/o pain in Lt residual limb.  RN premedicated  See Function Navigator for Current Functional Status.   Therapy/Group: Individual Therapy  Rosalio LoudHOXIE, Adrine Hayworth 07/20/2017, 12:47 PM

## 2017-07-20 NOTE — Progress Notes (Signed)
.  Patient ID: [name] Petersen, female   DOB: 08/19/1926, 84 y.o.   MRN: [id number] Comfortable.  Up in chair.  Right heel with intact blister and full-thickness skin loss apparent.  Continue to keep pressure off this.  Left BKA healing with bruising resolving.  Continue with therapy

## 2017-07-20 NOTE — Progress Notes (Signed)
Physical Therapy Session Note  Patient Details  Name: [name] Petersen MRN: [id number] Date of Birth: [birth year]  Today's Date: 07/20/2017 PT Individual Time: 1300-1415 PT Individual Time Calculation (min): 75 min   Short Term Goals: Week 1:  PT Short Term Goal 1 (Week 1): pt will roll L >< R with mod assist PT Short Term Goal 2 (Week 1): pt will move supine> sit with mod assist PT Short Term Goal 3 (Week 1): pt will transfer w/c>< bed wiht assist of 1 person PT Short Term Goal 4 (Week 1): pt will propel w/c x 50' with min assist PT Short Term Goal 5 (Week 1): pt will state 3 components of basic transfer  Skilled Therapeutic Interventions/Progress Updates: Pt presented in w/c with family present agreeable to therapy. Pt practiced lateral scoot with bed rail w/c bed with min A x 2. Discussed with family set up for transfers (bathroom and bed at home). Family adv plan on getting hospital bed for transfers and anticipate possibly setting up wall rail for toilet transfer. Pt propelled w/c to rehab gym supervision and performed blocked practice squat pivot transfers x 6 to from mat with pt verbalizing steps prior to each transfer. Participated in sit to/from stands x 5 from lowered mat x 5 initially modA fading to min. Squat pivot back to w/c minA and participated in setting up amputee pad and locking/unlocking breaks on w/c. Performed w/c obstacle course weaving through cones with min cues for negotiation. Propelled back to room and performed squat pivot to bed minA. Pt left in bed at end of session with call bell within reach and needs met.      Therapy Documentation Precautions:  Precautions Precautions: Fall Restrictions Weight Bearing Restrictions: Yes LLE Weight Bearing: Non weight bearing  See Function Navigator for Current Functional Status.   Therapy/Group: Individual Therapy  Kasarah Sitts  Gracin Mcpartland, PTA  07/20/2017, 3:21 PM

## 2017-07-20 NOTE — Patient Care Conference (Signed)
Inpatient RehabilitationTeam Conference and Plan of Care Update Date: 07/20/2017   Time: 2:30 PM    Patient Name: [name] Petersen      Medical Record Number: [id number]  Date of Birth: 10/31/1926 Sex: Female         Room/Bed: 4M07C/4M07C-01 Payor Info: Payor: MEDICARE / Plan: MEDICARE PART A AND B / Product Type: *No Product type* /    Admitting Diagnosis: l bka  Admit Date/Time:  07/15/2017  3:57 PM Admission Comments: No comment available   Primary Diagnosis:  PAD (peripheral artery disease) (HCC) Principal Problem: PAD (peripheral artery disease) (HCC)  Patient Active Problem List   Diagnosis Date Noted  . Phantom limb pain (HCC)   . Unilateral complete BKA, left, sequela (HCC)   . Slow transit constipation   . Acute lower UTI   . Unilateral complete BKA, left, initial encounter (HCC) 07/15/2017  . Atherosclerotic PVD with ulceration (HCC) 07/14/2017  . Pressure injury of right heel, stage 3 (HCC)   . Sleep disturbance   . Poor nutrition   . Hypokalemia   . Drug-induced constipation   . Essential hypertension   . Stage 3 chronic kidney disease (HCC)   . Postoperative pain   . Diabetes mellitus type 2 in obese (HCC)   . S/P below knee amputation, left (HCC) 07/10/2017  . PAD (peripheral artery disease) (HCC) 07/06/2017  . Atherosclerosis of native arteries of the extremities with ulceration (HCC) 06/16/2017    Expected Discharge Date: Expected Discharge Date: 07/26/17  Team Members Present: Physician leading conference: Dr. Maryla MorrowAnkit Petersen Social Worker Present: [name] JupiterLucy Ailis Rigaud, LCSW Nurse Present: [name] ArnoldStacey Jennings, RN PT Present: [name] Petersen, PT;[name] Petersen, PTA OT Present: [name] Petersen, OT SLP Present: [name] Petersen, SLP PPS Coordinator present : [name] DuckMarie Noel, RN, CRRN     Current Status/Progress Goal Weekly Team Focus  Medical   Decreased functional mobility secondary to left BKA secondary to peripheral vascular disease 07/06/2017. Pt s/p arteriogram, transferred off unit  post-procedure.   Improve mobility, safety, transfers, neuropathic pain, wounds, constipation, UTI  See above   Bowel/Bladder   incontinent at times, LBM 3/30, a smear on 07/19/17 after a suppository, miralax & 2 senna S, in & out cath q 8hrs if no void/scan, no void last night cathed for 350ml  continence & ability to void  bowel & bladder program, cath schedule   Swallow/Nutrition/ Hydration             ADL's   min assist bathing, steady assist UB dressing, mod assist LB dressing and transfers with slide board  min assist overall  ADL retraining, transfers, activity tolerance/endurance, pt/family education   Mobility   min assist to min guard SB transfer, min-modA squat pivot, supervision w/c mobility, maxA step in parallel bars  Min assist transfers. Mod assist gait for short distances. Supervision assist WC mobility.   transfers, standing tolerance, pre-gait/gait   Communication             Safety/Cognition/ Behavioral Observations            Pain   c/o pain scale 9/10 this morning, last had percocet 2143, has tramadol q 8prn, tylenol q 4prn & percocet q 6 prn, scheduled neurontin TID  pain scale <3  continue to assess & treat as needed   Skin   surgical wound to the left stump, extensive bruising to bil groin, left flank, hip, thigh, abdomen, large blood blister to the right lateral heel with foam covering  no new areas  of skin break down  assess q shift    Rehab Goals Patient on target to meet rehab goals: Yes *See Care Plan and progress notes for long and short-term goals.     Barriers to Discharge  Current Status/Progress Possible Resolutions Date Resolved   Physician    Medical stability;Weight bearing restrictions     See above  Therapies, follow labs, otpimize DM/BP/bowel meds, supplement K+, abx for UTI, Vascular recs      Nursing  Weight bearing restrictions;Wound Care               PT                    OT                  SLP                SW                 Discharge Planning/Teaching Needs:  home with family to coordinate 24/7 assistance vs SNF  Teaching to be planned prior to d/c.   Team Discussion:  Returning CIR pt;  C/o neuropathic pain;  Vascular continues to follow;  CKD stable and watching BP.  UTI - on abx and family remains concerned about urinary retention.  MD aware and will add meds if needed.  Currently min/ mod assist with sl board transfers with min assist goals @ w/c.  May be able to amb very short distance with mod assist.  Pt very fearful of any movements and daughters are hovering.  SW to follow up on d/c plan.  Revisions to Treatment Plan:  None    Continued Need for Acute Rehabilitation Level of Care: The patient requires daily medical management by a physician with specialized training in physical medicine and rehabilitation for the following conditions: Daily direction of a multidisciplinary physical rehabilitation program to ensure safe treatment while eliciting the highest outcome that is of practical value to the patient.: Yes Daily medical management of patient stability for increased activity during participation in an intensive rehabilitation regime.: Yes Daily analysis of laboratory values and/or radiology reports with any subsequent need for medication adjustment of medical intervention for : Urological problems;Other;Renal problems;Diabetes problems;Blood pressure problems;Post surgical problems;Wound care problems  [name] Petersen 07/20/2017, 4:16 PM

## 2017-07-20 NOTE — Progress Notes (Signed)
Social Work Patient ID: [name] Petersen, female   DOB: 05-Apr-1927, 84 y.o.   MRN: [id number]     [name]  Social Worker  General Practice  Patient Care Conference  Signed  Date of Service:  07/20/2017  4:15 PM          Signed          [] Hide copied text  [] Hover for details   Inpatient RehabilitationTeam Conference and Plan of Care Update Date: 07/20/2017   Time: 2:30 PM      Patient Name: [name] Petersen      Medical Record Number: [id number]  Date of Birth: September 17, 1926 Sex: Female         Room/Bed: 4M07C/4M07C-01 Payor Info: Payor: MEDICARE / Plan: MEDICARE PART A AND B / Product Type: *No Product type* /     Admitting Diagnosis: l bka  Admit Date/Time:  07/15/2017  3:57 PM Admission Comments: No comment available    Primary Diagnosis:  PAD (peripheral artery disease) (HCC) Principal Problem: PAD (peripheral artery disease) (HCC)       Patient Active Problem List    Diagnosis Date Noted  . Phantom limb pain (HCC)    . Unilateral complete BKA, left, sequela (HCC)    . Slow transit constipation    . Acute lower UTI    . Unilateral complete BKA, left, initial encounter (HCC) 07/15/2017  . Atherosclerotic PVD with ulceration (HCC) 07/14/2017  . Pressure injury of right heel, stage 3 (HCC)    . Sleep disturbance    . Poor nutrition    . Hypokalemia    . Drug-induced constipation    . Essential hypertension    . Stage 3 chronic kidney disease (HCC)    . Postoperative pain    . Diabetes mellitus type 2 in obese (HCC)    . S/P below knee amputation, left (HCC) 07/10/2017  . PAD (peripheral artery disease) (HCC) 07/06/2017  . Atherosclerosis of native arteries of the extremities with ulceration (HCC) 06/16/2017      Expected Discharge Date: Expected Discharge Date: 07/26/17   Team Members Present: Physician leading conference: Dr. Maryla Morrow Social Worker Present: Amada Jupiter, LCSW Nurse Present: Kennyth Arnold, RN PT Present: Grier Rocher,  PT;Rosita Dechalus, PTA OT Present: Rosalio Loud, OT SLP Present: Jackalyn Lombard, SLP PPS Coordinator present : Tora Duck, RN, CRRN       Current Status/Progress Goal Weekly Team Focus  Medical     Decreased functional mobility secondary to left BKA secondary to peripheral vascular disease 07/06/2017. Pt s/p arteriogram, transferred off unit post-procedure.   Improve mobility, safety, transfers, neuropathic pain, wounds, constipation, UTI  See above   Bowel/Bladder     incontinent at times, LBM 3/30, a smear on 07/19/17 after a suppository, miralax & 2 senna S, in & out cath q 8hrs if no void/scan, no void last night cathed for  continence & ability to void  bowel & bladder program, cath schedule   Swallow/Nutrition/ Hydration               ADL's     min assist bathing, steady assist UB dressing, mod assist LB dressing and transfers with slide board  min assist overall  ADL retraining, transfers, activity tolerance/endurance, pt/family education   Mobility     min assist to min guard SB transfer, min-modA squat pivot, supervision w/c mobility, maxA step in parallel bars  Min assist transfers. Mod assist gait for short distances. Supervision assist  WC mobility.   transfers, standing tolerance, pre-gait/gait   Communication               Safety/Cognition/ Behavioral Observations             Pain     c/o pain scale 9/10 this morning, last had percocet 2143, has tramadol q 8prn, tylenol q 4prn & percocet q 6 prn, scheduled neurontin TID  pain scale <3  continue to assess & treat as needed   Skin     surgical wound to the left stump, extensive bruising to bil groin, left flank, hip, thigh, abdomen, large blood blister to the right lateral heel with foam covering  no new areas of skin break down  assess q shift     Rehab Goals Patient on target to meet rehab goals: Yes *See Care Plan and progress notes for long and short-term goals.      Barriers to Discharge   Current Status/Progress  Possible Resolutions Date Resolved   Physician     Medical stability;Weight bearing restrictions     See above  Therapies, follow labs, otpimize DM/BP/bowel meds, supplement K+, abx for UTI, Vascular recs      Nursing   Weight bearing restrictions;Wound Care             PT                    OT                 SLP            SW              Discharge Planning/Teaching Needs:  home with family to coordinate 24/7 assistance vs SNF  Teaching to be planned prior to d/c.   Team Discussion:  Returning CIR pt;  C/o neuropathic pain;  Vascular continues to follow;  CKD stable and watching BP.  UTI - on abx and family remains concerned about urinary retention.  MD aware and will add meds if needed.  Currently min/ mod assist with sl board transfers with min assist goals @ w/c.  May be able to amb very short distance with mod assist.  Pt very fearful of any movements and daughters are hovering.  SW to follow up on d/c plan.  Revisions to Treatment Plan:  None    Continued Need for Acute Rehabilitation Level of Care: The patient requires daily medical management by a physician with specialized training in physical medicine and rehabilitation for the following conditions: Daily direction of a multidisciplinary physical rehabilitation program to ensure safe treatment while eliciting the highest outcome that is of practical value to the patient.: Yes Daily medical management of patient stability for increased activity during participation in an intensive rehabilitation regime.: Yes Daily analysis of laboratory values and/or radiology reports with any subsequent need for medication adjustment of medical intervention for : Urological problems;Other;Renal problems;Diabetes problems;Blood pressure problems;Post surgical problems;Wound care problems   Idali Lafever 07/20/2017, 4:16 PM                  Anselm Pancoast, LCSW  Social Worker  General Practice  Patient Care Conference  Signed  Date of  Service:  07/14/2017 12:08 PM          Signed          [] Hide copied text  [] Hover for details   Inpatient RehabilitationTeam Conference and Plan of Care Update Date: 07/13/2017  Time: 2:50 PM      Patient Name: [name] Petersen      Medical Record Number: [id number]  Date of Birth: 09/08/1926 Sex: Female         Room/Bed: 4M07C/4M07C-01 Payor Info: Payor: MEDICARE / Plan: MEDICARE PART A AND B / Product Type: *No Product type* /     Admitting Diagnosis: L BKA   Admit Date/Time:  07/10/2017  4:44 PM Admission Comments: No comment available    Primary Diagnosis:  <principal problem not specified> Principal Problem: <principal problem not specified>       Patient Active Problem List    Diagnosis Date Noted  . Atherosclerotic PVD with ulceration (HCC) 07/14/2017  . Pressure injury of right heel, stage 3 (HCC)    . Sleep disturbance    . Poor nutrition    . Hypokalemia    . Drug-induced constipation    . Essential hypertension    . Stage 3 chronic kidney disease (HCC)    . Postoperative pain    . Diabetes mellitus type 2 in obese (HCC)    . S/P below knee amputation, left (HCC) 07/10/2017  . PAD (peripheral artery disease) (HCC) 07/06/2017  . Atherosclerosis of native arteries of the extremities with ulceration (HCC) 06/16/2017      Expected Discharge Date: Expected Discharge Date: 07/26/17   Team Members Present: Physician leading conference: Dr. Maryla MorrowAnkit Patel Social Worker Present: Amada JupiterLucy Kalvyn Desa, LCSW Nurse Present: Chrissie NoaMelanie Barnes, RN PT Present: Grier RocherAustin Tucker, PT;Karen Donaworth, PT;Rosita Dechalus, PTA OT Present: Rosalio LoudSarah Hoxie, OT SLP Present: Jackalyn LombardNicole Page, SLP PPS Coordinator present : Tora DuckMarie Noel, RN, CRRN       Current Status/Progress Goal Weekly Team Focus  Medical     Decreased functional mobility secondary to left BKA secondary to peripheral vascular disease 07/06/2017  Improve mobility, safety, transfers, pain, BP/DM, consitpation, hypokalemia, sleep  See  above   Bowel/Bladder     Incotinent of Bladder and Bowel, LBM 3/26   Continence of Bladder /Bowel  Assess for urinary and bowel issues QS, adminster laxative prophylacticas orderedwithi2-3 days    Swallow/Nutrition/ Hydration               ADL's     max-total assist bathing/dressing at bed level, max-total assist slide board transfers, +2 for toileting   min assist overall  ADL retraining, transfers, activity tolerance/endurance, pt/family education   Mobility     maxA SB transfer, maxA sit to stand from elevated mat, mod/maxA bed mobility with use of features, minA w/c mobility   S bed, min assist all transfer, S w/c; no gait  bed mobility, sitting balance, SB transfers, endurance, d/c planning   Communication               Safety/Cognition/ Behavioral Observations   Alert oriented cooperative, able to make her needs know no reported falls or injuries  No falls or injuries while in  Rehab  Assess and educate patient, families on safety protocol and policy and monitor for safety concerns QS and prn   Pain     Tramadol Q12 hrs admiistered for surgical wound left BKA and generalized pain rate pain 8/10  < 3  Assess and address pain QS and reassess effectiveness of medication or alternate methods of treatment   Skin     S/P surgical wound to Left stump area,      Assess skin and surgical area QS and document outcomes      Rehab Goals Patient on target to  meet rehab goals: Yes *See Care Plan and progress notes for long and short-term goals.      Barriers to Discharge   Current Status/Progress Possible Resolutions Date Resolved   Physician     Medical stability;Weight bearing restrictions     See above  Therapies, optimize pain meds, follow labs, otpimize DM/BP, bowel, sleep meds, supplement K+      Nursing                 PT                    OT Incontinence               SLP            SW              Discharge Planning/Teaching Needs:  home with family to coordinate 24/7  assistance      Team Discussion:  MD addressing bowel issues/ nausea.  Currently mod-max assist with PT and ot and goals being set up withfor supervision - min assist overall.  Anticipate mostly w/c level goals.  Revisions to Treatment Plan:  None    Continued Need for Acute Rehabilitation Level of Care: The patient requires daily medical management by a physician with specialized training in physical medicine and rehabilitation for the following conditions: Daily direction of a multidisciplinary physical rehabilitation program to ensure safe treatment while eliciting the highest outcome that is of practical value to the patient.: Yes Daily medical management of patient stability for increased activity during participation in an intensive rehabilitation regime.: Yes Daily analysis of laboratory values and/or radiology reports with any subsequent need for medication adjustment of medical intervention for : Urological problems;Other;Renal problems;Diabetes problems;Blood pressure problems;Post surgical problems;Nutritional problems   Milah Recht 07/14/2017, 4:15 PM

## 2017-07-20 NOTE — Progress Notes (Signed)
Physical Therapy Session Note  Patient Details  Name: [name] Petersen MRN: [id number] Date of Birth: 12-Feb-1927  Today's Date: 07/20/2017 PT Individual Time: 1105-1205   60 min   Short Term Goals: Week 1:  PT Short Term Goal 1 (Week 1): pt will roll L >< R with mod assist PT Short Term Goal 2 (Week 1): pt will move supine> sit with mod assist PT Short Term Goal 3 (Week 1): pt will transfer w/c>< bed wiht assist of 1 person PT Short Term Goal 4 (Week 1): pt will propel w/c x 50' with min assist PT Short Term Goal 5 (Week 1): pt will state 3 components of basic transfer  Skilled Therapeutic Interventions/Progress Updates:   Pt received sitting in WC and agreeable to PT  WC mobility instructed by PT with supervision assist x 188f. Min cues from PT [facility] well as improved turning technique  SB transfer to Mat table in rehab gym with min assist from PT. Moderate cues for proper anterior weight shift as well as proper weight shift to prevent anterior sliding off board.   Sit<>supine with supervision assist and min-mod cues for safety and technique. Supine<>prone with supervision assist and min cues for technique and safety awareness for LLE positioning.  Supine therex instructed by PT SAQ, SLR, hip adduciton/abuiction. Isometric adductor squeezes, bridges,   All completed 2x 12 with min-mod cues from PT for proper speed of movement to improve strengthening aspect of exercises.  Prone hip extension x 10 with AAROM  And 30 sec hold  On least 2 reps for each LE to incresed ROM.    [facility] returned to [facility] and [facility] and all needs met.        Therapy Documentation Precautions:  Precautions Precautions: Fall Restrictions Weight Bearing Restrictions: Yes LLE Weight Bearing: Non weight bearing   Pain: Pain Assessment Pain Scale: 0-10 Pain Score: 9  Pain Type: Surgical pain Pain Location: Leg Pain Orientation: Left Pain  Descriptors / Indicators: Aching Pain Frequency: Intermittent Pain Onset: On-going Pain Intervention(s): Medication (See eMAR)   See Function Navigator for Current Functional Status.   Therapy/Group: Individual Therapy  ALorie Phenix4/06/2017, 8:09 AM

## 2017-07-21 ENCOUNTER — Inpatient Hospital Stay (HOSPITAL_COMMUNITY): Payer: Medicare Other | Admitting: Occupational Therapy

## 2017-07-21 ENCOUNTER — Inpatient Hospital Stay (HOSPITAL_COMMUNITY): Payer: Medicare Other | Admitting: Physical Therapy

## 2017-07-21 NOTE — Progress Notes (Signed)
Social Work Patient ID: [name] Petersen, female   DOB: [birth year], 84 y.o.   MRN: [id number]   Met with pt and two daughters this afternoon to review team conference.  Reported that targeted d/c date still 4/9 and min assist w/c goals overall.  Daughters immediately became upset with this date and feel it is "way too soon! She's not even going to the bathroom!"  Very focused on urinary retention issues.  Attempted to explain that MD hopeful issues may resolve as UTI is treated and will consider additional meds if needed.  Also, attempted to explain that, if needed, we could education on I/O caths - they became upset with this suggestion as well.  They are both asking about getting a urology consult - will alert MD/PA to this request.  I also stressed that, other than the bladder care, pt is expected to need hands on min assist 24/7 support at home.  Discussed that, if family cannot provide this, then they will need to look at private duty care or SNF placement.  Daughter insisting that "we have 60 days here under Medicare" so I provided education on meeting LOC need to remain on CIR vs Medicare coverage of SNF.   I believe that this was understood, however, will likely need reinforcement.  I think that each team member may need to explain the reasoning behind targeted d/c date and goals and MD/PA to address (again) plans for bladder.  Will follow up again with team and pt/family tomorrow.  Shigeo Baugh, LCSW

## 2017-07-21 NOTE — Progress Notes (Signed)
[facility]     PROGRESS NOTE  Subjective/Complaints:  Patient seen sitting up in bed eating breakfast this morning. Daughter at bedside. Patient states she slept well overnight. She had a bowel movement yesterday per daughter. Daughter has questions about urinary retention. Patient wants to ensure we continue her bowel meds.  ROS: Denies CP, SOB, N/V/D.   Objective: Vital Signs: Blood pressure (!) 150/65, pulse 80, temperature 98.4 F (36.9 C), temperature source Oral, resp. rate 18, height 5\' 2"  (1.575 m), weight 71.3 kg (157 lb 3 oz), SpO2 100 %. No results found. No results for input(s): WBC, HGB, HCT, PLT in the last 72 hours. Recent Labs    07/19/17 0700  NA 136  K 4.4  CL 100*  GLUCOSE 92  BUN 18  CREATININE 1.17*  CALCIUM 8.8*   CBG (last 3)  No results for input(s): GLUCAP in the last 72 hours.  Wt Readings from Last 3 Encounters:  07/15/17 71.3 kg (157 lb 3 oz)  07/15/17 71.3 kg (157 lb 3 oz)  07/11/17 76 kg (167 lb 8.8 oz)    Physical Exam:  BP (!) 150/65 (BP Location: Left Arm)   Pulse 80   Temp 98.4 F (36.9 C) (Oral)   Resp 18   Ht 5\' 2"  (1.575 m)   Wt 71.3 kg (157 lb 3 oz)   SpO2 100%   BMI 28.75 kg/m  Constitutional: No distress . Vital signs reviewed.  HENT: Normocephalic, atraumatic. Eyes: EOMI, No discharge. Cardiovascular: RRR. No JVD    Respiratory: CTA Bilaterally. Normal effort    GI: BS +, Non-distended  Musculoskeletal: Left stump edema. Left BKA and right foot sensitive to touch. Neurological.   Alert Follows simple commands Motor:  Right lower extremity: Hip flexion, knee extension 4-4+/5, ankle dorsiflexion 5/5  Left lower extremity: Hip flexion 4/5  Skin: BKA with dressing C/D/I Right heel ulcer just behind lateral malleolus with necrosis, dressed Psych: Flat.  Assessment/Plan: 1. Functional deficits secondary to left BKA which require 3+ hours per day of interdisciplinary therapy in a  comprehensive inpatient rehab setting. Physiatrist is providing close team supervision and 24 hour management of active medical problems listed below. Physiatrist and rehab team continue to assess barriers to discharge/monitor patient progress toward functional and medical goals.  Function:  Bathing Bathing position Bathing activity did not occur: Refused Position: Bed(LB at bed level, UB seated EOB)  Bathing parts Body parts bathed by patient: Right arm, Left arm, Chest, Abdomen, Front perineal area, Right upper leg, Left upper leg Body parts bathed by helper: Buttocks, Right lower leg, Back  Bathing assist Assist Level: Touching or steadying assistance(Pt > 75%)      Upper Body Dressing/Undressing Upper body dressing Upper body dressing/undressing activity did not occur: Refused What is the patient wearing?: Pull over shirt/dress     Pull over shirt/dress - Perfomed by patient: Thread/unthread right sleeve, Thread/unthread left sleeve, Put head through opening, Pull shirt over trunk          Upper body assist Assist Level: Touching or steadying assistance(Pt > 75%)      Lower Body Dressing/Undressing Lower body dressing Lower body dressing/undressing activity did not occur: Refused What is the patient wearing?: Pants, Non-skid slipper socks     Pants- Performed by patient: Thread/unthread left pants leg, Pull pants up/down Pants- Performed by helper: Thread/unthread right pants leg Non-skid slipper socks- Performed by patient: Don/doff right sock Non-skid slipper socks- Performed by helper: Don/doff right  sock                  Lower body assist Assist for lower body dressing: (Mod assist)      Toileting Toileting Toileting activity did not occur: No continent bowel/bladder event   Toileting steps completed by helper: Adjust clothing prior to toileting, Performs perineal hygiene, Adjust clothing after toileting    Toileting assist Assist level: Two helpers    Transfers Chair/bed transfer   Chair/bed transfer method: Squat pivot Chair/bed transfer assist level: Moderate assist (Pt 50 - 74%/lift or lower) Chair/bed transfer assistive device: Armrests, Sliding board     Locomotion Ambulation Ambulation activity did not occur: Safety/medical concerns         Wheelchair   Type: Manual Max wheelchair distance: 151ft  Assist Level: Touching or steadying assistance (Pt > 75%)  Cognition Comprehension Comprehension assist level: Follows basic conversation/direction with no assist  Expression Expression assist level: Expresses basic needs/ideas: With no assist  Social Interaction Social Interaction assist level: Interacts appropriately 75 - 89% of the time - Needs redirection for appropriate language or to initiate interaction.  Problem Solving Problem solving assist level: Solves basic problems with no assist  Memory Memory assist level: Recognizes or recalls 75 - 89% of the time/requires cueing 10 - 24% of the time    Medical Problem List and Plan: 1.  Decreased functional mobility secondary to left BKA secondary to peripheral vascular disease 07/06/2017. Pt s/p arteriogram, transferred off unit post-procedure. Now back to rehab.    Cont CIR 2.  DVT Prophylaxis/Anticoagulation: Subcutaneous heparin, changed to Lovenox on 4/2.  Monitor for any bleeding episodes 3. Pain Management: using tramadol given poor opioid tolerance.    Gabapentin 100 TID started on 4/1 4. Mood: Provide emotional support 5. Neuropsych: This patient is not consistently capable of making decisions on her own behalf. 6. Skin/Wound Care: Routine skin checks   Appreciate Vascular recs, no changes at present  7. Fluids/Electrolytes/Nutrition: Routine I/O's    Resumed remeron for sleep/appetite 8.  CKD stage III.  Creatinine baseline 1.30-1.59.     Creatinine 1.17 on 4/2  Labs ordered for tomorrow   Encourage fluids   Continue to monitor 9.  Hypertension.  Norvasc 10 mg  daily, (HCTZ 12.5 mg twice daily on HOLD. )     Monitor with increased mobility   Cozaar 50 mg qd    Vitals:   07/20/17 1500 07/21/17 0246  BP: 140/66 (!) 150/65  Pulse: 80 80  Resp: 16 18  Temp: 98 F (36.7 C) 98.4 F (36.9 C)  SpO2: 100% 100%    Elevated on 4/4, will consider resuming HCTZ after labs tomorrow 10.  History of CVA.  Continue Plavix. 11.  Diet controlled diabetes mellitus.  Hemoglobin A1c 6.0.      No need for CBGs 12. Obesity   Body mass index is 28.75 kg/m.   Diet and exercise education   Encourage weight loss to increase endurance and promote overall health 13. Hypokalemia: Resolved after supplementation 14. Drug induced constipation   Improving   Family notes combo of 2 Senna S and prune juice work   Increased reg on 4/2 with improvement 15. Sleep disturbance   Remeron initiated on 3/27   Melatonin 1.5mg  started on 3/27 16.  Acute lower UTI    Ucx with Proteus and Pseudomonas  Will discuss with pharmacy   Keflex changed to Levaquin on 4/2-4/5  Labs ordered for tomorrow  LOS (Days) 6 A FACE  TO FACE EVALUATION WAS PERFORMED  Shahla Betsill Karis Jubanil Eleonora Peeler 07/21/2017 8:56 AM

## 2017-07-21 NOTE — Progress Notes (Signed)
Occupational Therapy Weekly Progress Note  Patient Details  Name: [name] Petersen MRN: [id number] Date of Birth: 08-26-1926  Beginning of progress report period: July 16, 2017 End of progress report period: July 21, 2017  Today's Date: 07/21/2017 OT Individual Time: 1105-1200 and 1502-1550 OT Individual Time Calculation (min): 55 min and 48 min   Patient has met 3 of 3 short term goals.  Pt is making steady progress towards goals.  Pt evaluated on 3/25 and then re-evaluated on 3/30 due to interrupted stay with arteriogram on RLE.  Pt has demonstrated improvement with bed mobility and decreased pain during self-care tasks and mobility.  Pt has progressed from +2 assist with transfers to min assist squat pivot and min-mod with slide board.  Pt is completing sit > stand with min assist and mod assist for dynamic standing balance.  Pt continues to require max encouragement as pt does not see the progress she is making.  Have started education with pt's daughters and granddaughters, they will require additional hands on family education prior to d/c home.  Patient continues to demonstrate the following deficits: muscle weakness, decreased cardiorespiratoy endurance, decreased attention, decreased problem solving, decreased safety awareness, decreased memory and delayed processing and decreased sitting balance, decreased standing balance and decreased balance strategies and therefore will continue to benefit from skilled OT intervention to enhance overall performance with BADL and Reduce care partner burden.  Patient progressing toward long term goals..  Continue plan of care.  OT Short Term Goals Week 1:  OT Short Term Goal 1 (Week 1): Pt will complete bathing with mod assist OT Short Term Goal 1 - Progress (Week 1): Met OT Short Term Goal 2 (Week 1): Pt will complete LB dressing with max assist of 1 caregiver OT Short Term Goal 2 - Progress (Week 1): Met OT Short Term Goal 3 (Week 1): Pt will  complete toilet transfer with MOD assist of 1 caregiver OT Short Term Goal 3 - Progress (Week 1): Met Week 2:  OT Short Term Goal 1 (Week 2): STG = LTGs due to remaining LOS  Skilled Therapeutic Interventions/Progress Updates:   1) Treatment session with focus on transfers and problem solving d/c plan.  Pt received upright in BSC attempting to void, with minimal output.  Transferred to EOB with mod assist squat pivot.  Engaged in discussion regarding squat pivot vs slide board transfers requesting input from pt on her personal preference of transfer technique.  Discussed need to continue to address both as pt may fluctuate and various situations may require one over the other.  Discussed pt goals to be as independent as possible and what that means for d/c with family assist and long term care policy.  Engaged in sit > stand x7 with focus on anterior weight shift and dynamic standing balance for LB hygiene and clothing management.  Pt able to pull pants over hips in standing with min assist for standing balance.  Pt reports dressing at sit > stand would be too much for her daughters, therefore discussed bathing/dressing at bed level to decrease burden of care and work towards increased standing with HHOT/PT and any hired assist.  Plan to further address during future sessions.  Squat pivot transfer bed <> w/c min assist with improved anterior weight shift.  Pt's daughter present throughout session, deferring questions to pt for the most part.  Pt left semi-reclined in bed to allow for bladder scan and cath.  2) Treatment session with focus on sit <>  stand and toilet transfers.  Two of patient's daughters present and pt's granddaughter during session.  Engaged in squat pivot transfers x3 w/c <> drop arm BSC with mod assist to Lt and min assist to Rt.  Discussed recommendation for wide drop arm BSC to allow for placement of slide board as needed as well as to provide surface area for ease of transfers and  clothing management.  Engaged in blocked practice of sit <> stand with therapist progressing to hands on training with all 3 family members present.  Discussed use of gait belt which each felt more confident during standing using gait belt for stability/safety.  Each family member assisted pt with sit > stand x2 with min assist.  Educated on body mechanics as well as recommendation for pt to count to ensure they are in sync with movement. Patient and family members all expressed increased confidence with the physical aspect of assisting as therapist explained and each demonstrated min assist for sit > stand.    Pt's family expressing concerns with d/c date mostly due to medical concerns with pt not voiding on own.  Family members will require additional hands on therapy for transfers, bed mobility, and self-care tasks.  Therapy Documentation Precautions:  Precautions Precautions: Fall Restrictions Weight Bearing Restrictions: Yes LLE Weight Bearing: Non weight bearing Pain:  Pt with no c/o pain  See Function Navigator for Current Functional Status.   Therapy/Group: Individual Therapy  Simonne Come 07/21/2017, 7:56 AM

## 2017-07-21 NOTE — Progress Notes (Signed)
Physical Therapy Session Note  Patient Details  Name: [name] Petersen MRN: [id number] Date of Birth: 10/08/1926  Today's Date: 07/21/2017 PT Individual Time: 1330-1400 PT Individual Time Calculation (min): 30 min   Short Term Goals: Week 1:  PT Short Term Goal 1 (Week 1): pt will roll L >< R with mod assist PT Short Term Goal 2 (Week 1): pt will move supine> sit with mod assist PT Short Term Goal 3 (Week 1): pt will transfer w/c>< bed wiht assist of 1 person PT Short Term Goal 4 (Week 1): pt will propel w/c x 50' with min assist PT Short Term Goal 5 (Week 1): pt will state 3 components of basic transfer  Skilled Therapeutic Interventions/Progress Updates:    Pt semi-reclined in bed, agreeable to participate in therapy session. Pt reports 6/10 pain in L hip region, RN provides pain medication prior to start of therapy session. Semi-reclined to sitting EOB with SBA. Sliding board transfer bed to w/c with mod assist. Sit to stand x 5 reps from w/c to RW with mod assist for weight shift, v/c for safety and sequencing of transfer. Standing balance with RW and mod assist for weight shift forwards as pt tends to have posterior lean in standing. Manual w/c propulsion x 150 ft with SBA. Pt left seated in w/c in room with needs in reach and ice pack to L hip.  Therapy Documentation Precautions:  Precautions Precautions: Fall Restrictions Weight Bearing Restrictions: Yes LLE Weight Bearing: Non weight bearing  See Function Navigator for Current Functional Status.   Therapy/Group: Individual Therapy  Peter Congoaylor Desi Carby, PT, DPT  07/21/2017, 4:52 PM

## 2017-07-21 NOTE — Progress Notes (Signed)
Physical Therapy Session Note  Patient Details  Name: [name] Petersen MRN: [id number] Date of Birth: 09/01/1926  Today's Date: 07/21/2017 PT Individual Time: 1608-1705 PT Individual Time Calculation (min): 57 min   Short Term Goals: Week 1:  PT Short Term Goal 1 (Week 1): pt will roll L >< R with mod assist PT Short Term Goal 2 (Week 1): pt will move supine> sit with mod assist PT Short Term Goal 3 (Week 1): pt will transfer w/c>< bed wiht assist of 1 person PT Short Term Goal 4 (Week 1): pt will propel w/c x 50' with min assist PT Short Term Goal 5 (Week 1): pt will state 3 components of basic transfer  Skilled Therapeutic Interventions/Progress Updates:   Pt received sitting in WC and agreeable to PT  PT instructed pt in WC mobility x 150ft with supervision assist and min cues for doorway management.    Arm bike 3 min forward, 2 min backward. Level 4.5. 2 min rest break between bouts with min cues for increased speed to improve endurance aspect of exercises.  Press ups from WC using BUE on arm rests, 3 sec hold at the top. X 10 with supervision-min assist.    Gait training with RW x 3 ft and mod assist from PT. Max cues for improve push through BUE to allow advancement of the RLE. Pt able to take 2 adequate steps and 4-5 short "hop steps.  Blocked practice squat pivot transfer with Min-mod assist from PT. Moderate cues from PT for improve UE placement increased anterior weight shift, and increased gluteal clearance from Seat.   Patient returned to room and left sitting in WC with call bell in reach and all needs met.        Therapy Documentation Precautions:  Precautions Precautions: Fall Restrictions Weight Bearing Restrictions: Yes LLE Weight Bearing: Non weight bearing Vital Signs: Therapy Vitals Temp: 98.9 F (37.2 C) Temp Source: Oral Pulse Rate: 82 Resp: 18 BP: 140/74 Patient Position (if appropriate): Lying Oxygen Therapy SpO2: 99 % O2 Device: Room  Air Pain: Pain Assessment Pain Score: 0/10    See Function Navigator for Current Functional Status.   Therapy/Group: Individual Therapy   E  07/21/2017, 5:05 PM  

## 2017-07-22 ENCOUNTER — Inpatient Hospital Stay (HOSPITAL_COMMUNITY): Payer: Medicare Other | Admitting: Occupational Therapy

## 2017-07-22 ENCOUNTER — Inpatient Hospital Stay (HOSPITAL_COMMUNITY): Payer: Medicare Other | Admitting: Physical Therapy

## 2017-07-22 LAB — CBC WITH DIFFERENTIAL/PLATELET
Basophils Absolute: 0 10*3/uL (ref 0.0–0.1)
Basophils Relative: 0 %
Eosinophils Absolute: 0.3 10*3/uL (ref 0.0–0.7)
Eosinophils Relative: 3 %
HCT: 35.3 % — ABNORMAL LOW (ref 36.0–46.0)
Hemoglobin: 11 g/dL — ABNORMAL LOW (ref 12.0–15.0)
Lymphocytes Relative: 12 %
Lymphs Abs: 1.2 10*3/uL (ref 0.7–4.0)
MCH: 28.9 pg (ref 26.0–34.0)
MCHC: 31.2 g/dL (ref 30.0–36.0)
MCV: 92.9 fL (ref 78.0–100.0)
Monocytes Absolute: 0.9 10*3/uL (ref 0.1–1.0)
Monocytes Relative: 10 %
Neutro Abs: 7.2 10*3/uL (ref 1.7–7.7)
Neutrophils Relative %: 75 %
Platelets: 479 10*3/uL — ABNORMAL HIGH (ref 150–400)
RBC: 3.8 MIL/uL — ABNORMAL LOW (ref 3.87–5.11)
RDW: 14.8 % (ref 11.5–15.5)
WBC: 9.6 10*3/uL (ref 4.0–10.5)

## 2017-07-22 LAB — BASIC METABOLIC PANEL
Anion gap: 9 (ref 5–15)
BUN: 22 mg/dL — ABNORMAL HIGH (ref 6–20)
CO2: 24 mmol/L (ref 22–32)
Calcium: 8.9 mg/dL (ref 8.9–10.3)
Chloride: 101 mmol/L (ref 101–111)
Creatinine, Ser: 1.48 mg/dL — ABNORMAL HIGH (ref 0.44–1.00)
GFR calc Af Amer: 35 mL/min — ABNORMAL LOW (ref >60)
GFR calc non Af Amer: 30 mL/min — ABNORMAL LOW (ref >60)
Glucose, Bld: 139 mg/dL — ABNORMAL HIGH (ref 65–99)
Potassium: 4.3 mmol/L (ref 3.5–5.1)
Sodium: 134 mmol/L — ABNORMAL LOW (ref 135–145)

## 2017-07-22 NOTE — Progress Notes (Signed)
Occupational Therapy Session Note  Patient Details  Name: [name] Petersen MRN: [id number] Date of Birth: Jul 10, 1926  Today's Date: 07/22/2017 OT Individual Time: 1115-1200 OT Individual Time Calculation (min): 45 min    Short Term Goals: Week 1:  OT Short Term Goal 1 (Week 1): Pt will complete bathing with mod assist OT Short Term Goal 1 - Progress (Week 1): Met OT Short Term Goal 2 (Week 1): Pt will complete LB dressing with max assist of 1 caregiver OT Short Term Goal 2 - Progress (Week 1): Met OT Short Term Goal 3 (Week 1): Pt will complete toilet transfer with MOD assist of 1 caregiver OT Short Term Goal 3 - Progress (Week 1): Met Week 2:  OT Short Term Goal 1 (Week 2): STG = LTGs due to remaining LOS  Skilled Therapeutic Interventions/Progress Updates:    1:1 Focus on transfer training including w/c<>wide BCS, sit to stands from [facility] with simulating clothing management, w/c <>recliner in ADL apartment, and tub bench <>w/c in bathroom. Recommended tub bench for transfers into shower stall at home instead of hopping over ledge. Pt able to perform all transfers multiple times with min to mod A and standing balance with RW with min A. Pt needs encouragement and extra time.  Pt still with purple blister (unopened) on her right planter surface of the foot. Discussed with nursing and PA about getting a PRAFO to use during standing for more protection- PA put in order for one.   Therapy Documentation Precautions:  Precautions Precautions: Fall Restrictions Weight Bearing Restrictions: Yes LLE Weight Bearing: Non weight bearing Pain:  no c/o pain   See Function Navigator for Current Functional Status.   Therapy/Group: Individual Therapy  Willeen Cass [facility] 07/22/2017, 2:40 PM

## 2017-07-22 NOTE — Progress Notes (Signed)
Physical Therapy Weekly Progress Note  Patient Details  Name: [name] Petersen MRN: [id number] Date of Birth: January 03, 1927  Beginning of progress report period: July 16, 2017 End of progress report period: July 22, 2017  Today's Date: 07/22/2017 PT Individual Time: 0900-0930 PT Individual Time Calculation (min): 30 min   Patient has met 5 of 5 short term goals.  Pt is making steady progress towards long term goals. Currently requiring min assist for SB and Squat pivot transfers. Supervision assist bed mobility, and WC mobility. Pt's anxiety and fear of falling are he biggest limiting factors preventing increased progress.   Patient continues to demonstrate the following deficits muscle weakness and muscle joint tightness, decreased cardiorespiratoy endurance, decreased problem solving and delayed processing and decreased sitting balance, decreased standing balance and decreased balance strategies and therefore will continue to benefit from skilled PT intervention to increase functional independence with mobility.  Patient progressing toward long term goals..  Continue plan of care.  PT Short Term Goals Week 1:  PT Short Term Goal 1 (Week 1): pt will roll L >< R with mod assist PT Short Term Goal 1 - Progress (Week 1): Met PT Short Term Goal 2 (Week 1): pt will move supine> sit with mod assist PT Short Term Goal 2 - Progress (Week 1): Met PT Short Term Goal 3 (Week 1): pt will transfer w/c>< bed wiht assist of 1 person PT Short Term Goal 3 - Progress (Week 1): Met PT Short Term Goal 4 (Week 1): pt will propel w/c x 50' with min assist PT Short Term Goal 4 - Progress (Week 1): Met PT Short Term Goal 5 (Week 1): pt will state 3 components of basic transfer PT Short Term Goal 5 - Progress (Week 1): Met Week 2:  PT Short Term Goal 1 (Week 2): STG=LTG due to ELOS  Skilled Therapeutic Interventions/Progress Updates:  Session 1.   Pt received supine in bed and agreeable to PT. Supine>sit  transfer with supervision assist and cues to prevent WB through R heel and LLE.   Stand pivot transfer with min assist from PT and moderate cues for technique and safety.   PT educated pt on limb wrapping with demonstration.   WC mobility with supervision assist x 156f to rehab gym and to navigate dthrough 6 cones, 5 ft apart. Min cues through dynamic WC mobility and attention to obstacles throughout.   [facility] and all needs met.    Session 2.   Pt received supine in bed and agreeable to PT. Supine>sit transfer with supervision assist and min cues from PT.   [facility]. Min verbal cues for proper UE placement and increased clearance from seat.   WC mobility x [id number] supervision assist from PT as well as min cues to navigate WC through crowded hall.   Blocked practice squat pivot transfer x 8 R and L with min assist overall fading to Contact guard assist from PT. Moderate cues for sequencing throughout with emphasis on increased height from seat to improve safety and ease of transfers.   Sit<>stand in parallel bars x 10 with supervision assist for RLE strengthening.   Pt returned to room and performed squat pivot transfer to bed x2 with PT and Daughter. Moderate cues for daughter on how to instruct pt through transfer to allow pt to perform more of transfer and require less assist. Sit>supine completed  with supervision assist, and pt left supine in bed with call bell in reach and all needs met.          Therapy Documentation Precautions:  Precautions Precautions: Fall Restrictions Weight Bearing Restrictions: Yes LLE Weight Bearing: Non weight bearing   Pain: denies      See Function Navigator for Current Functional Status.  Therapy/Group: Individual Therapy  Lorie Phenix 07/22/2017, 9:39 AM

## 2017-07-22 NOTE — Progress Notes (Signed)
Occupational Therapy Session Note  Patient Details  Name: [name] Petersen MRN: [id number] Date of Birth: 03/16/1927  Today's Date: 07/22/2017 OT Individual Time: 1005-1100 OT Individual Time Calculation (min): 55 min    Short Term Goals: Week 2:  OT Short Term Goal 1 (Week 2): STG = LTGs due to remaining LOS  Skilled Therapeutic Interventions/Progress Updates:    Treatment session with focus on squat pivot transfers and d/c planning.  Pt received upright in w/c with daughter present and TR present.  Pt continues to report concerns about transfers bed <> w/c and transfers to toilet.  Reiterated recommendations from yesterday for wide drop arm BSC to allow for increased surface area for transfers with and without slide board.  Therapist obtained wide drop arm BSC and completed transfers x3 w/c <> drop arm BSC with min assist.  Pt continues to express concerns that she requires too much assistance and that her daughters will not be able to lift her.  Explained recommendation for him assist but no lifting assistance.  Engaged in transfers w/c <> bed x3 with min assist to Rt and Lt.  Pt demonstrating improved sequencing and initiation when pt counts to 3 to initiate transfer.  Pt reports feeling better about transfers with increased practice, however still voices concern about being a burden and too much care for her daughters.  Pt left upright in w/c with daughter present.  Therapy Documentation Precautions:  Precautions Precautions: Fall Restrictions Weight Bearing Restrictions: Yes LLE Weight Bearing: Non weight bearing Pain:  Pt reports pain in LLE, however did not c/o pain during session.  See Function Navigator for Current Functional Status.   Therapy/Group: Individual Therapy  Rosalio LoudHOXIE, Erich Kochan 07/22/2017, 12:56 PM

## 2017-07-22 NOTE — Progress Notes (Signed)
Recreational Therapy Session Note  Patient Details  Name: [name] Petersen MRN: [id number] Date of Birth: 06/28/1926 Today's Date: 07/22/2017  Pain: c/o intermittent LLE pain, premedicated Skilled Therapeutic Interventions/Progress Updates: Full TR eval deferred as pt is discharging home 4/9.  Me with pt and daughter to discuss leisure interests, activity analysis with potential modifications, community pursuits and energy conservation techniques.  Both stated understanding. Davonta Stroot 07/22/2017, 2:14 PM

## 2017-07-22 NOTE — Progress Notes (Signed)
[place] PHYSICAL MEDICINE & REHABILITATION     PROGRESS NOTE  Subjective/Complaints:  Still having pain in stump.  Also concerned about bruising around left inguinal area  ROS: Patient denies fever, rash, sore throat, blurred vision, nausea, vomiting, diarrhea, cough, shortness of breath or chest pain,   back pain, headache, or mood change.   Objective: Vital Signs: Blood pressure (!) 143/75, pulse 86, temperature 98.4 F (36.9 C), temperature source Oral, resp. rate 17, height 5\' 2"  (1.575 m), weight 71.3 kg (157 lb 3 oz), SpO2 97 %. No results found. No results for input(s): WBC, HGB, HCT, PLT in the last 72 hours. No results for input(s): NA, K, CL, GLUCOSE, BUN, CREATININE, CALCIUM in the last 72 hours.  Invalid input(s): CO CBG (last 3)  No results for input(s): GLUCAP in the last 72 hours.  Wt Readings from Last 3 Encounters:  07/15/17 71.3 kg (157 lb 3 oz)  07/15/17 71.3 kg (157 lb 3 oz)  07/11/17 76 kg (167 lb 8.8 oz)    Physical Exam:  BP (!) 143/75 (BP Location: Left Arm)   Pulse 86   Temp 98.4 F (36.9 C) (Oral)   Resp 17   Ht 5\' 2"  (1.575 m)   Wt 71.3 kg (157 lb 3 oz)   SpO2 97%   BMI 28.75 kg/m  Constitutional: No distress . Vital signs reviewed. HEENT: EOMI, oral membranes moist Cardiovascular: RRR without murmur. No JVD    Respiratory: CTA Bilaterally without wheezes or rales. Normal effort    GI: BS +, non-tender, non-distended  Musculoskeletal: Left stump edema. Left BKA and right foot sensitive to touch. Neurological.   Alert Follows simple commands Motor:  Right lower extremity: Hip flexion, knee extension 4-4+/5, ankle dorsiflexion 5/5  Left lower extremity: Hip flexion 4/5 --motor exam stable Skin: BKA with dressing C/D/I.  Large area of bruising around left lower quadrant of abdomen into upper thigh, subacute in appearance Right heel ulcer just behind lateral malleolus with necrosis--stable Psych: Flat.  Assessment/Plan: 1. Functional  deficits secondary to left BKA which require 3+ hours per day of interdisciplinary therapy in a comprehensive inpatient rehab setting. Physiatrist is providing close team supervision and 24 hour management of active medical problems listed below. Physiatrist and rehab team continue to assess barriers to discharge/monitor patient progress toward functional and medical goals.  Function:  Bathing Bathing position Bathing activity did not occur: Refused Position: Bed(LB at bed level, UB seated EOB)  Bathing parts Body parts bathed by patient: Right arm, Left arm, Chest, Abdomen, Front perineal area, Right upper leg, Left upper leg Body parts bathed by helper: Buttocks, Right lower leg, Back  Bathing assist Assist Level: Touching or steadying assistance(Pt > 75%)      Upper Body Dressing/Undressing Upper body dressing Upper body dressing/undressing activity did not occur: Refused What is the patient wearing?: Pull over shirt/dress     Pull over shirt/dress - Perfomed by patient: Thread/unthread right sleeve, Thread/unthread left sleeve, Put head through opening, Pull shirt over trunk          Upper body assist Assist Level: Touching or steadying assistance(Pt > 75%)      Lower Body Dressing/Undressing Lower body dressing Lower body dressing/undressing activity did not occur: Refused What is the patient wearing?: Pants, Non-skid slipper socks     Pants- Performed by patient: Thread/unthread left pants leg, Pull pants up/down Pants- Performed by helper: Thread/unthread right pants leg Non-skid slipper socks- Performed by patient: Don/doff right sock Non-skid slipper  socks- Performed by helper: Don/doff right sock                  Lower body assist Assist for lower body dressing: (Mod assist)      Toileting Toileting Toileting activity did not occur: No continent bowel/bladder event Toileting steps completed by patient: Adjust clothing prior to toileting, Adjust clothing  after toileting Toileting steps completed by helper: Performs perineal hygiene    Toileting assist Assist level: Two helpers   Transfers Chair/bed transfer   Chair/bed transfer method: Squat pivot Chair/bed transfer assist level: Touching or steadying assistance (Pt > 75%) Chair/bed transfer assistive device: Armrests     Locomotion Ambulation Ambulation activity did not occur: Safety/medical concerns   Max distance: 67ft Assist level: Moderate assist (Pt 50 - 74%)   Wheelchair   Type: Manual Max wheelchair distance: 138ft Assist Level: Supervision or verbal cues  Cognition Comprehension Comprehension assist level: Follows basic conversation/direction with no assist  Expression Expression assist level: Expresses basic needs/ideas: With no assist  Social Interaction Social Interaction assist level: Interacts appropriately 75 - 89% of the time - Needs redirection for appropriate language or to initiate interaction.  Problem Solving Problem solving assist level: Solves basic problems with no assist  Memory Memory assist level: Recognizes or recalls 75 - 89% of the time/requires cueing 10 - 24% of the time    Medical Problem List and Plan: 1.  Decreased functional mobility secondary to left BKA secondary to peripheral vascular disease 07/06/2017. Pt s/p arteriogram, transferred off unit post-procedure. Now back to rehab.    Cont CIR, PT, OT 2.  DVT Prophylaxis/Anticoagulation: Subcutaneous heparin, changed to Lovenox on 4/2.    -Left lower quadrant hematoma appears stable.      3. Pain Management: Continue tramadol given poor opioid tolerance.    Gabapentin 100 TID started on 4/1 4. Mood: Provide emotional support 5. Neuropsych: This patient is not consistently capable of making decisions on her own behalf. 6. Skin/Wound Care: Routine skin checks   Appreciate Vascular recs, no changes at present  7. Fluids/Electrolytes/Nutrition: Routine I/O's    Resumed remeron for  sleep/appetite 8.  CKD stage III.  Creatinine baseline 1.30-1.59.     Creatinine 1.17 on 4/2  Labs pending for today   Encourage fluids   Continue to monitor 9.  Hypertension.  Norvasc 10 mg daily, (HCTZ 12.5 mg twice daily on HOLD. )     Reasonable control at present   Cozaar 50 mg qd    Vitals:   07/21/17 1333 07/22/17 0532  BP: 140/74 (!) 143/75  Pulse: 82 86  Resp: 18 17  Temp: 98.9 F (37.2 C) 98.4 F (36.9 C)  SpO2: 99% 97%      10.  History of CVA.  Continue Plavix. 11.  Diet controlled diabetes mellitus.  Hemoglobin A1c 6.0.      No need for CBGs 12. Obesity   Body mass index is 28.75 kg/m.   Diet and exercise education   Encourage weight loss to increase endurance and promote overall health 13. Hypokalemia: Resolved after supplementation 14. Drug induced constipation   Improving   Family notes combo of 2 Senna S and prune juice work   Increased reg on 4/2.  Last bowel movement was 4/3 15. Sleep disturbance   Remeron initiated on 3/27   Melatonin 1.5mg  started on 3/27 16.  Acute lower UTI    Ucx with Proteus and Pseudomonas  Keflex changed to Levaquin which completes today  Labs pending today  LOS (Days) 7 A FACE TO FACE EVALUATION WAS PERFORMED  Ranelle Oyster 07/22/2017 9:38 AM

## 2017-07-22 NOTE — Progress Notes (Signed)
Orthopedic Tech Progress Note Patient Details:  Alfonse Flavorsauline C Tolley 08/02/1926 [id number]  Patient ID: Alfonse FlavorsPauline C Krakowski, female   DOB: 09/28/1926, 84 y.o.   MRN: [id number]   Nikki DomCrawford, Chandrika Sandles 07/22/2017, 2:35 PM Called in hanger brace order; spoke with Marcelino DusterMichelle

## 2017-07-23 ENCOUNTER — Inpatient Hospital Stay (HOSPITAL_COMMUNITY): Payer: Medicare Other | Admitting: Physical Therapy

## 2017-07-23 MED ORDER — POLYVINYL ALCOHOL 1.4 % OP SOLN
1.0000 [drp] | OPHTHALMIC | Status: DC | PRN
  Administered 2017-07-24 – 2017-07-28 (×3): 1 [drp] via OPHTHALMIC
  Filled 2017-07-23: qty 15

## 2017-07-23 NOTE — Progress Notes (Signed)
[name] Petersen is a 84 y.o. female 11/21/1926 [id number]  Subjective: Reports dryness of eyes bilaterally.  Daughter at side notes patient concerned over missing follow-up appointment for glaucoma since being hospitalized.  No vision changes or pain.  Reports sleeping well.  Objective: Vital signs in last 24 hours: Temp:  [98.2 F (36.8 C)-99.1 F (37.3 C)] 98.2 F (36.8 C) (04/06 0404) Pulse Rate:  [78-88] 78 (04/06 0404) Resp:  [17] 17 (04/05 1537) BP: (142-184)/(68-73) 184/73 (04/06 0404) SpO2:  [99 %-100 %] 99 % (04/06 0404) Weight change:  Last BM Date: 07/22/17  Intake/Output from previous day: 04/05 0701 - 04/06 0700 In: 480 [P.O.:480] Out: 1200 [Urine:1200]  Physical Exam General: No apparent distress but mildly anxious.  Sitting in bedside chair, daughter also present in room    Lungs: Normal effort. Lungs clear to auscultation, no crackles or wheezes. Cardiovascular: Regular rate and rhythm, no edema Wounds: L BKA dressing clean, dry, intact.  Lab Results: BMET    Component Value Date/Time   NA 134 (L) 07/22/2017 0853   K 4.3 07/22/2017 0853   CL 101 07/22/2017 0853   CO2 24 07/22/2017 0853   GLUCOSE 139 (H) 07/22/2017 0853   BUN 22 (H) 07/22/2017 0853   CREATININE 1.48 (H) 07/22/2017 0853   CALCIUM 8.9 07/22/2017 0853   GFRNONAA 30 (L) 07/22/2017 0853   GFRAA 35 (L) 07/22/2017 0853   CBC    Component Value Date/Time   WBC 9.6 07/22/2017 0853   RBC 3.80 (L) 07/22/2017 0853   HGB 11.0 (L) 07/22/2017 0853   HCT 35.3 (L) 07/22/2017 0853   PLT 479 (H) 07/22/2017 0853   MCV 92.9 07/22/2017 0853   MCH 28.9 07/22/2017 0853   MCHC 31.2 07/22/2017 0853   RDW 14.8 07/22/2017 0853   LYMPHSABS 1.2 07/22/2017 0853   MONOABS 0.9 07/22/2017 0853   EOSABS 0.3 07/22/2017 0853   BASOSABS 0.0 07/22/2017 0853   CBG's (last 3):  No results for input(s): GLUCAP in the last 72 hours. LFT's Lab Results  Component Value Date   ALT 43 07/16/2017   AST 42 (H)  07/16/2017   ALKPHOS 98 07/16/2017   BILITOT 1.0 07/16/2017    Studies/Results: No results found.  Medications:  I have reviewed the patient's current medications. Scheduled Medications: . amLODipine  10 mg Oral Daily  . clopidogrel  75 mg Oral Daily  . enoxaparin (LOVENOX) injection  30 mg Subcutaneous Q24H  . gabapentin  100 mg Oral TID  . losartan  50 mg Oral Daily  . mirtazapine  7.5 mg Oral QHS  . multivitamin  15 mL Oral Daily  . polyethylene glycol  17 g Oral BID  . senna-docusate  2 tablet Oral QHS   PRN Medications: acetaminophen, alum & mag hydroxide-simeth, bisacodyl, diphenhydrAMINE, guaiFENesin-dextromethorphan, methocarbamol, oxyCODONE-acetaminophen, polyvinyl alcohol, prochlorperazine **OR** prochlorperazine **OR** prochlorperazine, sodium phosphate, traMADol, traZODone  Assessment/Plan: Principal Problem:   Unilateral complete BKA, left, sequela (HCC) Active Problems:   PAD (peripheral artery disease) (HCC)   Essential hypertension   Stage 3 chronic kidney disease (HCC)   Diabetes mellitus type 2 in obese (HCC)   Acute lower UTI   Slow transit constipation   Phantom limb pain (HCC)   Length of stay, days: 8  1. left BKA March 20 related to peripheral artery disease.  Continue ongoing CIR PT and OT therapies as ordered.  Continue dressing changes as ongoing. 2.  Type 2 diabetes, controlled prior to admission.  Continue insulin and sliding  scale as ongoing.  CBGs well controlled. 3.  UTI with Proteus and Pseudomonas, status post antibiotic therapy.  Afebrile and asymptomatic.  Monitor for recurrence of symptoms as needed 4. Hypertension.  BP well controlled on current therapy.  No changes recommended. #5.  Dry eyes.  No injection, periorbital swelling or symptoms of pain reported.  Will add natural tears for sensation and dryness and monitor as needed  Mariaeduarda Defranco A. Felicity Coyer, MD 07/23/2017, 1:17 PM

## 2017-07-23 NOTE — Progress Notes (Signed)
Physical Therapy Session Note  Patient Details  Name: [name] Petersen MRN: [id number] Date of Birth: [birth year]  Today's Date: 07/23/2017 PT Individual Time: 1000-1057 PT Individual Time Calculation (min): 57 min   Short Term Goals: Week 1:  PT Short Term Goal 1 (Week 1): pt will roll L >< R with mod assist PT Short Term Goal 1 - Progress (Week 1): Met PT Short Term Goal 2 (Week 1): pt will move supine> sit with mod assist PT Short Term Goal 2 - Progress (Week 1): Met PT Short Term Goal 3 (Week 1): pt will transfer w/c>< bed wiht assist of 1 person PT Short Term Goal 3 - Progress (Week 1): Met PT Short Term Goal 4 (Week 1): pt will propel w/c x 50' with min assist PT Short Term Goal 4 - Progress (Week 1): Met PT Short Term Goal 5 (Week 1): pt will state 3 components of basic transfer PT Short Term Goal 5 - Progress (Week 1): Met  Skilled Therapeutic Interventions/Progress Updates:  Pt was seen bedside in the am with daughter at bedside. Pt transferred supine to edge of bed with head of bed elevated, side rail and min guard with verbal cues. Pt performed multiple transfers bed to chair, chair to bed with and without sliding board with c/s to min guard with verbal cues. Pt performed car transfers with sliding board and c/s to min guard with verbal cues. Performed family training for bed to chair, chair to bed transfers with/without sliding board and car transfers with sliding board. Pt's daughter performed transfers with c/s to min guard requiring occasional verbal cues from therapist for technique and safety. Pt returned to room following treatment and left sitting up in w/c with family at bedside.   Therapy Documentation Precautions:  Precautions Precautions: Fall Restrictions Weight Bearing Restrictions: Yes LLE Weight Bearing: Non weight bearing General:   Vital Signs:  Pain: Pt c/o 7/10 pain L BKA, medicated prior to treatment.   See Function Navigator for Current Functional  Status.   Therapy/Group: Individual Therapy  Dub Amis 07/23/2017, 12:25 PM

## 2017-07-24 NOTE — Progress Notes (Signed)
[name] Petersen is a 84 y.o. female 02/11/1927 [id number]  Subjective: Feels tired. Pain present but controlled. No eye problems reported today  Objective: Vital signs in last 24 hours: Temp:  [98.4 F (36.9 C)-98.5 F (36.9 C)] 98.4 F (36.9 C) (04/07 0700) Pulse Rate:  [73-78] 78 (04/07 0700) Resp:  [17] 17 (04/07 0700) BP: (114-158)/(50-78) 158/78 (04/07 0700) SpO2:  [97 %-98 %] 97 % (04/07 0700) Weight change:  Last BM Date: 07/23/17  Intake/Output from previous day: 04/06 0701 - 04/07 0700 In: 390 [P.O.:390] Out: 1895 [Urine:1895]  Physical Exam General: No apparent distress    Lungs: Normal effort. Lungs clear to auscultation, no crackles or wheezes. Cardiovascular: Regular rate and rhythm, no edema Wounds:  L BKA dressing clean, dry, intact.  Lab Results: BMET    Component Value Date/Time   NA 134 (L) 07/22/2017 0853   K 4.3 07/22/2017 0853   CL 101 07/22/2017 0853   CO2 24 07/22/2017 0853   GLUCOSE 139 (H) 07/22/2017 0853   BUN 22 (H) 07/22/2017 0853   CREATININE 1.48 (H) 07/22/2017 0853   CALCIUM 8.9 07/22/2017 0853   GFRNONAA 30 (L) 07/22/2017 0853   GFRAA 35 (L) 07/22/2017 0853   CBC    Component Value Date/Time   WBC 9.6 07/22/2017 0853   RBC 3.80 (L) 07/22/2017 0853   HGB 11.0 (L) 07/22/2017 0853   HCT 35.3 (L) 07/22/2017 0853   PLT 479 (H) 07/22/2017 0853   MCV 92.9 07/22/2017 0853   MCH 28.9 07/22/2017 0853   MCHC 31.2 07/22/2017 0853   RDW 14.8 07/22/2017 0853   LYMPHSABS 1.2 07/22/2017 0853   MONOABS 0.9 07/22/2017 0853   EOSABS 0.3 07/22/2017 0853   BASOSABS 0.0 07/22/2017 0853   CBG's (last 3):  No results for input(s): GLUCAP in the last 72 hours. LFT's Lab Results  Component Value Date   ALT 43 07/16/2017   AST 42 (H) 07/16/2017   ALKPHOS 98 07/16/2017   BILITOT 1.0 07/16/2017    Studies/Results: No results found.  Medications:  I have reviewed the patient's current medications. Scheduled Medications: . amLODipine  10  mg Oral Daily  . clopidogrel  75 mg Oral Daily  . enoxaparin (LOVENOX) injection  30 mg Subcutaneous Q24H  . gabapentin  100 mg Oral TID  . losartan  50 mg Oral Daily  . mirtazapine  7.5 mg Oral QHS  . multivitamin  15 mL Oral Daily  . polyethylene glycol  17 g Oral BID  . senna-docusate  2 tablet Oral QHS   PRN Medications: acetaminophen, alum & mag hydroxide-simeth, bisacodyl, diphenhydrAMINE, guaiFENesin-dextromethorphan, methocarbamol, oxyCODONE-acetaminophen, polyvinyl alcohol, prochlorperazine **OR** prochlorperazine **OR** prochlorperazine, sodium phosphate, traMADol, traZODone  Assessment/Plan: Principal Problem:   Unilateral complete BKA, left, sequela (HCC) Active Problems:   PAD (peripheral artery disease) (HCC)   Essential hypertension   Stage 3 chronic kidney disease (HCC)   Diabetes mellitus type 2 in obese (HCC)   Acute lower UTI   Slow transit constipation   Phantom limb pain (HCC)   Length of stay, days: 9  See problems as listed 4/6 - no changes Continue CIR and ongoing mgmt of med co-morbidities  [name] Kolodziejski A. Felicity CoyerLeschber, MD 07/24/2017, 12:02 PM

## 2017-07-25 ENCOUNTER — Inpatient Hospital Stay (HOSPITAL_COMMUNITY): Payer: Medicare Other | Admitting: Occupational Therapy

## 2017-07-25 ENCOUNTER — Inpatient Hospital Stay (HOSPITAL_COMMUNITY): Payer: Medicare Other

## 2017-07-25 DIAGNOSIS — R339 Retention of urine, unspecified: Secondary | ICD-10-CM

## 2017-07-25 MED ORDER — HYDROCHLOROTHIAZIDE 12.5 MG PO CAPS
12.5000 mg | ORAL_CAPSULE | Freq: Two times a day (BID) | ORAL | Status: DC
  Administered 2017-07-25 – 2017-07-29 (×9): 12.5 mg via ORAL
  Filled 2017-07-25 (×8): qty 1

## 2017-07-25 MED ORDER — BETHANECHOL CHLORIDE 25 MG PO TABS
25.0000 mg | ORAL_TABLET | Freq: Three times a day (TID) | ORAL | Status: DC
  Administered 2017-07-25 – 2017-07-27 (×7): 25 mg via ORAL
  Filled 2017-07-25 (×6): qty 1

## 2017-07-25 NOTE — Progress Notes (Signed)
[name] Petersen PHYSICAL MEDICINE & REHABILITATION     PROGRESS NOTE  Subjective/Complaints:  Pt still not voiding. Denies any pain. Does feel some fullness. I/O caths from 250-600 cc. Family concerned.   ROS: Patient denies fever, rash, sore throat, blurred vision, nausea, vomiting, diarrhea, cough, shortness of breath or chest pain, joint or back pain, headache, or mood change.     Objective: Vital Signs: Blood pressure (!) 145/65, pulse 78, temperature 98.2 F (36.8 C), temperature source Oral, resp. rate 18, height 5\' 2"  (1.575 m), weight 71.3 kg (157 lb 3 oz), SpO2 95 %. No results found. No results for input(s): WBC, HGB, HCT, PLT in the last 72 hours. No results for input(s): NA, K, CL, GLUCOSE, BUN, CREATININE, CALCIUM in the last 72 hours.  Invalid input(s): CO CBG (last 3)  No results for input(s): GLUCAP in the last 72 hours.  Wt Readings from Last 3 Encounters:  07/15/17 71.3 kg (157 lb 3 oz)  07/15/17 71.3 kg (157 lb 3 oz)  07/11/17 76 kg (167 lb 8.8 oz)    Physical Exam:  BP (!) 145/65 (BP Location: Left Arm)   Pulse 78   Temp 98.2 F (36.8 C) (Oral)   Resp 18   Ht 5\' 2"  (1.575 m)   Wt 71.3 kg (157 lb 3 oz)   SpO2 95%   BMI 28.75 kg/m  Constitutional: No distress . Vital signs reviewed. HEENT: EOMI, oral membranes moist Cardiovascular: RRR without murmur. No JVD    Respiratory: CTA Bilaterally without wheezes or rales. Normal effort    GI: BS +, non-tender, non-distended  Musculoskeletal: Stump less sensitive.  Able to extend left knee with pressure upon patella to neutral. Neurological.   Alert Follows simple commands Motor:  Right lower extremity: Hip flexion, knee extension 4-4+/5, ankle dorsiflexion 5/5  Left lower extremity: Hip flexion 4/5 --motor exam stable Skin: left BK incision clean and intact with staples. 1.5" blood blister/sore right heel.  hematoma abdomen into upper thigh stable in appearance   Psych: Cooperative.  Assessment/Plan: 1.  Functional deficits secondary to left BKA which require 3+ hours per day of interdisciplinary therapy in a comprehensive inpatient rehab setting. Physiatrist is providing close team supervision and 24 hour management of active medical problems listed below. Physiatrist and rehab team continue to assess barriers to discharge/monitor patient progress toward functional and medical goals.  Function:  Bathing Bathing position Bathing activity did not occur: Refused Position: Bed(LB at bed level, UB seated EOB)  Bathing parts Body parts bathed by patient: Right arm, Left arm, Chest, Abdomen, Front perineal area, Right upper leg, Left upper leg Body parts bathed by helper: Buttocks, Right lower leg, Back  Bathing assist Assist Level: Touching or steadying assistance(Pt > 75%)      Upper Body Dressing/Undressing Upper body dressing Upper body dressing/undressing activity did not occur: Refused What is the patient wearing?: Pull over shirt/dress     Pull over shirt/dress - Perfomed by patient: Thread/unthread right sleeve, Thread/unthread left sleeve, Put head through opening, Pull shirt over trunk          Upper body assist Assist Level: Touching or steadying assistance(Pt > 75%)      Lower Body Dressing/Undressing Lower body dressing Lower body dressing/undressing activity did not occur: Refused What is the patient wearing?: Pants, Non-skid slipper socks     Pants- Performed by patient: Thread/unthread left pants leg, Pull pants up/down Pants- Performed by helper: Thread/unthread right pants leg Non-skid slipper socks- Performed by patient:  Don/doff right sock Non-skid slipper socks- Performed by helper: Don/doff right sock                  Lower body assist Assist for lower body dressing: (Mod assist)      Toileting Toileting Toileting activity did not occur: No continent bowel/bladder event Toileting steps completed by patient: Adjust clothing prior to toileting, Adjust  clothing after toileting Toileting steps completed by helper: Adjust clothing prior to toileting, Performs perineal hygiene, Adjust clothing after toileting    Toileting assist Assist level: Touching or steadying assistance (Pt.75%)   Transfers Chair/bed transfer   Chair/bed transfer method: Squat pivot, Lateral scoot Chair/bed transfer assist level: Touching or steadying assistance (Pt > 75%) Chair/bed transfer assistive device: Armrests, Sliding board     Locomotion Ambulation Ambulation activity did not occur: Safety/medical concerns   Max distance: 81ft Assist level: Moderate assist (Pt 50 - 74%)   Wheelchair   Type: Manual Max wheelchair distance: 163ft Assist Level: Supervision or verbal cues  Cognition Comprehension Comprehension assist level: Follows basic conversation/direction with no assist  Expression Expression assist level: Expresses basic needs/ideas: With no assist  Social Interaction Social Interaction assist level: Interacts appropriately 90% of the time - Needs monitoring or encouragement for participation or interaction.  Problem Solving Problem solving assist level: Solves basic problems with no assist  Memory Memory assist level: Recognizes or recalls 90% of the time/requires cueing < 10% of the time    Medical Problem List and Plan: 1.  Decreased functional mobility secondary to left BKA secondary to peripheral vascular disease 07/06/2017. Pt s/p arteriogram, transferred off unit post-procedure. Now back to rehab.    Cont CIR, PT, OT   -Finalizing discharge planning.  Family stating they will not bring her home if she is not emptying her bladder 2.  DVT Prophylaxis/Anticoagulation: Continue Lovenox    -Left lower quadrant hematoma appears stable to improved.      3. Pain Management: Continue tramadol given poor opioid tolerance.    Gabapentin 100 TID started on 4/1 with improvement 4. Mood: Provide emotional support 5. Neuropsych: This patient is not  consistently capable of making decisions on her own behalf. 6. Skin/Wound Care: Routine skin checks   Wounds all appear stable   -All wounds  visualized and redressed 7. Fluids/Electrolytes/Nutrition: Routine I/O's    Resumed remeron for sleep/appetite 8.  CKD stage III.  Creatinine baseline 1.30-1.59.     Creatinine 1.48 on 4/5      Encourage fluids   Continue to monitor 9.  Hypertension.  Norvasc 10 mg daily, (HCTZ 12.5 mg twice daily on HOLD. )     Reasonable control at present   Cozaar 50 mg qd   Stable at present Vitals:   07/24/17 1359 07/25/17 0543  BP: 132/64 (!) 145/65  Pulse: 80 78  Resp:  18  Temp: 98.4 F (36.9 C) 98.2 F (36.8 C)  SpO2: 98% 95%      10.  History of CVA.  Continue Plavix. 11.  Diet controlled diabetes mellitus.  Hemoglobin A1c 6.0.      No need for CBGs 12. Obesity   Body mass index is 28.75 kg/m.   Diet and exercise education   Encourage weight loss to increase endurance and promote overall health 13. Hypokalemia: Resolved after supplementation 14. Drug induced constipation   Improving   Family notes combo of 2 Senna S and prune juice work   Increased reg on 4/2.  Last bowel movement was  4/3 15. Sleep disturbance   Remeron initiated on 3/27   Melatonin 1.5mg  started on 3/27 16. UTI/urine retention   Ucx with Proteus and Pseudomonas  Levaquin completed on Friday.  White count down to 9.6  Still having significant retention.   -Recheck urinalysis and culture  -Begin trial of Urecholine 25 mg 3 times daily  -Out of bed to void, double voids and bladder massage  LOS (Days) 10 A FACE TO FACE EVALUATION WAS PERFORMED  Ranelle OysterZachary T Talia Hoheisel 07/25/2017 9:19 AM

## 2017-07-25 NOTE — Progress Notes (Signed)
Occupational Therapy Session Note  Patient Details  Name: [name] Petersen MRN: [id number] Date of Birth: 07/06/1926  Today's Date: 07/25/2017 OT Individual Time: 1300-1345 OT Individual Time Calculation (min): 45 min    Short Term Goals: Week 2:  OT Short Term Goal 1 (Week 2): STG = LTGs due to remaining LOS  Skilled Therapeutic Interventions/Progress Updates:    Pt seen this session for family education with her daughter and granddaughter focusing on hands on training for transfers, toileting, sit to stand with RW.  Demonstration with repeat demonstration with grandaughter for sliding board use w/c >< bed.  Reviewed with pt the need for a slight forward lean, leaning in opposite direction of hip movement direction and reviewed with granddaughter board placement, techniques for skin protection if pt was not dressed.  At the end of 2nd transfer, pt stated she had just had a bowel movement in her brief. Pt taken into bathroom with BSC over toilet.  Therapist transferred pt with stand pivot to toilet using grab bar with min A and then practiced a sit to stand to RW to have pants removed.  Due to incontinence did not have pt try to remove her pants. Her granddtr then practiced sit to stand with pt and maintaining hand contact to provide steadying A while assisting pt with cleansing.  Her dtr then practiced the same maneuver to pull pants over hips.   They both did well but needed cues to always maintain hands on contact as pt is not ready to stand with RW with S only.  Dtr then assisted pt with stand pivot transfer back to w/c. Pt needed frequent cues during session for hand placement. She did not appear fearful with transfers.  Pt in w/c with dtr in the room with her.   Therapy Documentation Precautions:  Precautions Precautions: Fall Restrictions Weight Bearing Restrictions: Yes LLE Weight Bearing: Non weight bearing   Pain: Pain Assessment Pain Score: 5  Pain Intervention(s): Medication (See  eMAR)    ADL:    See Function Navigator for Current Functional Status.   Therapy/Group: Individual Therapy  Koron Godeaux 07/25/2017, 12:19 PM

## 2017-07-25 NOTE — Progress Notes (Signed)
Patient voided in toilet three times today with the last time leaving a residual of 420cc. Family requested patient not to be In & out cathed, but to wait and give her the opportunity to void in the toilet again. Passed this information along the the night RN.

## 2017-07-25 NOTE — Progress Notes (Signed)
Patient ID: [name] Petersen, female   DOB: 09/21/1926, 84 y.o.   MRN: [id number] Comfortable.  Working with physical therapist. Left below-knee amputation healing.  Does have one area in the central incision with small 1 cm blistering.  No evidence of other breakdown Right heel unchanged with 2-3 cm blister which appears to be full-thickness pressure wound.  Doing a very good job of keeping pressure off of this.  Following with you

## 2017-07-25 NOTE — Plan of Care (Signed)
Patient unable to urinate on her own

## 2017-07-25 NOTE — Progress Notes (Signed)
Multiple attempts have been made to educate patient and her family how to I&O cath, but both patient and her family keep refusing. The family said because they are not going to take the patient home unless she is able to urinate on her own. This RN educated them about the important of learning how to do I&O cath because at some point in time they might have to do if she is unable to urinate by discharge date, but the daughter insisted that their mom was urinating before coming to the hospital so there is no way they can take her home if she is no urinating. The daughter is also requesting for the MD to give her mother pills that will help her urinate.

## 2017-07-25 NOTE — Progress Notes (Addendum)
Physical Therapy Note  Patient Details  Name: [name] Petersen MRN: [id number] Date of Birth: 12/15/1926 Today's Date: 07/25/2017  1105-1205, 60 min individual tx Pain: 5/10 residual limb, premedicated  Pt c/o that her vision is especially blurry today.  She stated that MD and PA are aware.  She typically gets her R eye injected for macular degeneration q 6 weeks, and feels it may be time for another tx.  PT urged pt/family to call her opthalmologist  to follow up on this.  tx focused on family ed with dtr Angelique Blonderenise for basic and car transfers with slide board, and w/c parts management.  Angelique BlonderDenise return demonstrated these skills safely. Pt initiated transfers after set-up, appropriately.  Pt performed dynamic sitting balance activity with L foot lightly on floor, reaching within BOS in all directions with either hand, supervison.  Pt propelled w/c x 150' with supervision, locked and unlocked brakes with supervision.  Bed mobility on firm mat with supervision, cues for best technique.  PT educated pt and daughter on desensitization techniques for residual limb.  Pt is reluctant to touch distal end of limb, even with ACE wrapping on it.  Pt stated that she was going to wait until it was "healed up" before trying desensitization. PT assured her that she could begin light desensitization touching and tapping, now.   PT added (2) 4# weights to front-R/L of w/c frame for safety.   Pt left resting in w/cwith all needs within reach, and dtr AlanreedDenise with her; awaiting lunch.  Pt is not d/c'ing tomorrow.   See function navigator for current status.  Nakiah Osgood 07/25/2017, 4:38 PM

## 2017-07-25 NOTE — Progress Notes (Signed)
Occupational Therapy Session Note  Patient Details  Name: Alfonse Flavorsauline C Eskin MRN: [id number] Date of Birth: 05/15/1926  Today's Date: 07/25/2017 OT Individual Time: 1000-1100 and 1500-1530 OT Individual Time Calculation (min): 60 min and 30 min   Short Term Goals: Week 2:  OT Short Term Goal 1 (Week 2): STG = LTGs due to remaining LOS  Skilled Therapeutic Interventions/Progress Updates:    1) Treatment session with focus on functional transfers and hands on education with pt's daughter, Angelique BlonderDenise.  Pt received upright in w/c already dressed for the day.  When asked, pt reports that her daughters assisted her in getting dressed as she is unable to complete on her own.  Engaged in further discussion regarding goals and therapist observation of pt ability to thread pant legs and pull pants over hips seated EOB and at bed level.  Encouraged pt to complete LB dressing with therapist during session tomorrow - with pt stating she would.  Therapist wrapped residual limb in figure 8 pattern, encouraging daughter to observe technique and reiterated handouts provided at beginning of stay.  Engaged in blocked practice with transfers w/c <> bed and bed <> drop arm BSC.  Pt requested to utilize slide board as she feels she is more independent.  Therapist provided min cues for setup of slide board and had daughter place slide board and provide min assist for all transfers.  Pt demonstrating improved initiation and motivation with transfers this session with use of slide board.  2) Treatment session with focus on education with pt's daughter, Angelique BlonderDenise, regarding limb wrapping.  Pt received supine in bed reporting fatigue but willing to engage in activity seated at EOB.  Therapist demonstrated limb wrapping in figure 8 pattern with pt's daughter observing.  Pt's daughter wrapped limb 3-4 times with mod faded to min cues for proper technique and not leaving any holes or gaps.  Encouraged pt to give feedback to her daughters when  they wrap residual limb regarding fit, also encouraged pt to utilize inspection mirror to take some ownership of her recovery even though she will not be doing the wrapping.  Pt returned to bed with supervision and left semi-reclined with all needs in reach.  Therapy Documentation Precautions:  Precautions Precautions: Fall Restrictions Weight Bearing Restrictions: Yes LLE Weight Bearing: Non weight bearing Pain: Pain Assessment Pain Score: 5  Pain Intervention(s): Medication (See eMAR)  See Function Navigator for Current Functional Status.   Therapy/Group: Individual Therapy  Rosalio LoudHOXIE, Benjiman Sedgwick 07/25/2017, 12:12 PM

## 2017-07-26 ENCOUNTER — Inpatient Hospital Stay (HOSPITAL_COMMUNITY): Payer: Medicare Other | Admitting: Occupational Therapy

## 2017-07-26 ENCOUNTER — Inpatient Hospital Stay (HOSPITAL_COMMUNITY): Payer: Medicare Other | Admitting: Physical Therapy

## 2017-07-26 DIAGNOSIS — R339 Retention of urine, unspecified: Secondary | ICD-10-CM

## 2017-07-26 LAB — BASIC METABOLIC PANEL
Anion gap: 8 (ref 5–15)
BUN: 19 mg/dL (ref 6–20)
CO2: 26 mmol/L (ref 22–32)
Calcium: 9.1 mg/dL (ref 8.9–10.3)
Chloride: 104 mmol/L (ref 101–111)
Creatinine, Ser: 1.29 mg/dL — ABNORMAL HIGH (ref 0.44–1.00)
GFR calc Af Amer: 42 mL/min — ABNORMAL LOW (ref >60)
GFR calc non Af Amer: 36 mL/min — ABNORMAL LOW (ref >60)
Glucose, Bld: 102 mg/dL — ABNORMAL HIGH (ref 65–99)
Potassium: 4.6 mmol/L (ref 3.5–5.1)
Sodium: 138 mmol/L (ref 135–145)

## 2017-07-26 LAB — URINALYSIS, COMPLETE (UACMP) WITH MICROSCOPIC
Bacteria, UA: NONE SEEN
Bilirubin Urine: NEGATIVE
Glucose, UA: NEGATIVE mg/dL
Hgb urine dipstick: NEGATIVE
Ketones, ur: NEGATIVE mg/dL
Leukocytes, UA: NEGATIVE
Nitrite: NEGATIVE
Protein, ur: NEGATIVE mg/dL
RBC / HPF: NONE SEEN RBC/hpf (ref 0–5)
Specific Gravity, Urine: 1.009 (ref 1.005–1.030)
pH: 6 (ref 5.0–8.0)

## 2017-07-26 NOTE — Progress Notes (Signed)
Orthopedic Tech Progress Note Patient Details:  [name] Petersen 08/30/1926 [id number]  Patient ID: [name] FlavorsPauline C Petersen, female   DOB: 12/17/1926, 84 y.o.   MRN: [id number]   [name] DomCrawford, [name] Petersen 07/26/2017, 9:31 AM Called in bio-tech brace order; spoke with [name] Petersen

## 2017-07-26 NOTE — Progress Notes (Signed)
Physical Therapy Session Note  Patient Details  Name: [name] Petersen MRN: [id number] Date of Birth: 1926-08-27  Today's Date: 07/26/2017 PT Individual Time: 0820-0930 PT Individual Time Calculation (min): 70 min   Short Term Goals: Week 2:  PT Short Term Goal 1 (Week 2): STG=LTG due to ELOS  Skilled Therapeutic Interventions/Progress Updates:   Missed 20 min of skilled PT at beginning of session as pt and family were talking w/ PA regarding medical concerns. Pt in supine and agreeable to therapy, no c/o pain. Session focused family education of functional transfers in anticipation of d/c home, residual limb care, and global strengthening. Transferred to EOB and assisted pt w/ rewrapping limb, educated pt on purpose for wrapping limb including edema management, protection, and limb shaping. Provided w/ handout that repeats information provided from therapist, wrapped and rewrapped limb x2 to demonstrate technique to daughter who was present. Transferred to w/c via squat pivot w/ min guard and manual assist to block R foot per pt's request for safety. Pt brushed teeth and hair w/ supervision and self-propelled w/c around unit w/ supervision using BUEs. Worked on global strengthening, exercises listed below. Ended session in w/c and in care of daughter, all needs met.   Strengthening exercises: -L quad set 2x10 -L knee bends 2x10 -L isometric hip extension 2x10 -UE 3# dowel rod push 2x5 -3# dowel bicep curl 2x10  Therapy Documentation Precautions:  Precautions Precautions: Fall Restrictions Weight Bearing Restrictions: Yes LLE Weight Bearing: Non weight bearing Vital Signs:   See Function Navigator for Current Functional Status.   Therapy/Group: Individual Therapy  Maleke Feria K Arnette 07/26/2017, 9:32 AM

## 2017-07-26 NOTE — Progress Notes (Signed)
Occupational Therapy Session Note  Patient Details  Name: [name] Petersen MRN: [id number] Date of Birth: 06/22/1926  Today's Date: 07/26/2017 OT Individual Time: 1000-1100 and 1500-1530 OT Individual Time Calculation (min): 60 min and 30 min   Short Term Goals: Week 2:  OT Short Term Goal 1 (Week 2): STG = LTGs due to remaining LOS  Skilled Therapeutic Interventions/Progress Updates:    1) Treatment session with focus on completing ADL retraining with increased independence.  Pt received seated upright in w/c with SWK present speaking with pt and daughter, [name].  Engaged in bathing/dressing with focus on increased independence with pt requesting to bathe at bed level instead of sit > stand as that "will be easier".  Pt's daughter excused herself to make phone calls and therefore was not present for much education during session. Completed squat pivot transfer w/c > bed with min assist, therapist blocking Rt foot per pt request. Pt completed bathing at bed level with setup for items, completing perineal area and buttocks rolling Rt and Lt.  Pt able to thread BLE into pants this session and pull pants over hips rolling Rt and Lt.  Completed UB bathing/dressing seated EOB with setup assist for items.  Utilized slide board for transfer back to w/c with min assist, pt reports she will transfer via slide board and/or squat pivot depending on caregiver assist and pt strength. Applied dycem to the bottom of PRAFO to ensure non-slip during transfers.  Pt left upright in w/c with daughter present.  2) Treatment session with focus on modifying PRAFO to ensure non-slip during transfers.  Pt received in sidelying in bed reporting pain in LLE and awaiting pain meds.  Pt's daughter reports dycem came off of PRAFO.  Therapist reapplied dycem to decrease slippage during transfers as pt very fearful when upright.  Repositioned foot pad on bottom of PRAFO to provide increased friction while maintaining offloading  position.  Discussed safety with transfers with increased traction/friction on carpet.  Pt and daughter pleased with modification of PRAFO.  Therapy Documentation Precautions:  Precautions Precautions: Fall Restrictions Weight Bearing Restrictions: Yes LLE Weight Bearing: Non weight bearing Pain:   Pt with no c/o pain  See Function Navigator for Current Functional Status.   Therapy/Group: Individual Therapy  Rosalio LoudHOXIE, Keller Bounds 07/26/2017, 12:48 PM

## 2017-07-26 NOTE — Progress Notes (Signed)
Social Work Patient ID: [name] FlavorsPauline C Petersen, female   DOB: 01/03/1927, 84 y.o.   MRN: [id number]   Alerted by PA that MD feels pt will be ready for d/c tomorrow as she has begun emptying bladder.  Discussed this with pt and daughter who immediately disagree and feel they should be allowed to stay "at least til the end of the week... We don't even have a ramp and the bathroom's not done...".  Attempted to explain that a ramp has been recommended since admission and that bathroom renovations are not a reason for continued hospitalization as we will order a bsc.  Daughter continues to state, "we have 60 days".  Reminded her that I discussed this with her last week and her 60 day hospital coverage under Medicare is solely based on medical necessity.  I have placed order for DME and HH.  Daughter asking about the type of hospital bed pt will get and is not happy with the description of a metal frame, semi-electric bed style.  Insists pt's  long-term policy will cover a better style of bed.  Have also attempted to confirm that family is able to provide the needed 24/7 care for pt.  Daughter reports she has spoken with 3 agencies and they have not yet confirmed their ability to staff her mother's case.  Recommended that there are several agencies in the community but daughter insists she is waiting to hear from one in particular.    I have alerted Dr. Allena KatzPatel to pt/ family wishes to be allowed to stay until the end of the week to complete home modifications. Pt and daughter very insistent and feel she has the right to do so despite my attempts to educate on Medicare guidelines. Will await MD input on these issues.  Audra Bellard, LCSW

## 2017-07-26 NOTE — Progress Notes (Signed)
Occupational Therapy Note  Patient Details  Name: [name] Petersen MRN: [id number] Date of Birth: 11/28/1926  Today's Date: 07/26/2017 OT Individual Time: 1345-1415 OT Individual Time Calculation (min): 30 min   No c/o pain    Pt received sitting on BSC in room receiving A from nurse tech. Pt's daughter present so used this as an opportunity for her dtr to practice sit >< stands with RW.   Pt completed toileting and used lateral lean to fully cleanse herself.   Pt had improved recall of hand placement with fully pushing up with B hands and then reaching for RW with steadying A. In standing, pt stood with contact guard as pants were managed over her hips.  Daughter practiced the sit to stands 2x after demonstration 2x.  Discussed problem of R foot boot slipping. Recommended placing small piece of carpet on wood or tile surfaces for more traction.  Suggested they try transfers on carpet during the next session.  Linnell Swords 07/26/2017, 2:58 PM

## 2017-07-26 NOTE — Progress Notes (Addendum)
[place] PHYSICAL MEDICINE & REHABILITATION     PROGRESS NOTE  Subjective/Complaints:  Pt seen lying in bed this AM.  She states she slept well overnight.  She states she has not had a BM, however, daughter states pt did.  Her d/c was held due to urinary retention, despite the fact that this was discussed with the patient and family.  Now family stating their house is not ready.   ROS: Denies CP, SOB, N/V/D   Objective: Vital Signs: Blood pressure (!) 154/79, pulse 75, temperature 98.6 F (37 C), temperature source Oral, resp. rate 16, height 5\' 2"  (1.575 m), weight 71.3 kg (157 lb 3 oz), SpO2 97 %. No results found. No results for input(s): WBC, HGB, HCT, PLT in the last 72 hours. Recent Labs    07/26/17 0731  NA 138  K 4.6  CL 104  GLUCOSE 102*  BUN 19  CREATININE 1.29*  CALCIUM 9.1   CBG (last 3)  No results for input(s): GLUCAP in the last 72 hours.  Wt Readings from Last 3 Encounters:  07/15/17 71.3 kg (157 lb 3 oz)  07/15/17 71.3 kg (157 lb 3 oz)  07/11/17 76 kg (167 lb 8.8 oz)    Physical Exam:  BP (!) 154/79 (BP Location: Right Arm)   Pulse 75   Temp 98.6 F (37 C) (Oral)   Resp 16   Ht 5\' 2"  (1.575 m)   Wt 71.3 kg (157 lb 3 oz)   SpO2 97%   BMI 28.75 kg/m  Constitutional: No distress . Vital signs reviewed. HEENT: EOMI, oral membranes moist Cardiovascular: RRR. No JVD    Respiratory: CTA Bilaterally. Normal effort    GI: BS +, non-distended  Musculoskeletal: Stump less sensitive.   Neurological.   Alert Follows simple commands Motor:  Right lower extremity: Hip flexion, knee extension 4-4+/5, ankle dorsiflexion 5/5  Left lower extremity: Hip flexion 4/5 (unchanged) Skin: left BK incision c/d/i.   Psych: Cooperative.  Assessment/Plan: 1. Functional deficits secondary to left BKA which require 3+ hours per day of interdisciplinary therapy in a comprehensive inpatient rehab setting. Physiatrist is providing close team supervision and 24 hour  management of active medical problems listed below. Physiatrist and rehab team continue to assess barriers to discharge/monitor patient progress toward functional and medical goals.  Function:  Bathing Bathing position Bathing activity did not occur: Refused Position: Bed(LB at bed level, UB seated EOB)  Bathing parts Body parts bathed by patient: Right arm, Left arm, Chest, Abdomen, Front perineal area, Right upper leg, Left upper leg Body parts bathed by helper: Buttocks, Right lower leg, Back  Bathing assist Assist Level: Touching or steadying assistance(Pt > 75%)      Upper Body Dressing/Undressing Upper body dressing Upper body dressing/undressing activity did not occur: Refused What is the patient wearing?: Pull over shirt/dress     Pull over shirt/dress - Perfomed by patient: Thread/unthread right sleeve, Thread/unthread left sleeve, Put head through opening, Pull shirt over trunk          Upper body assist Assist Level: Set up(per pt report)      Lower Body Dressing/Undressing Lower body dressing Lower body dressing/undressing activity did not occur: Refused What is the patient wearing?: Pants     Pants- Performed by patient: Thread/unthread left pants leg, Pull pants up/down Pants- Performed by helper: Thread/unthread right pants leg, Thread/unthread left pants leg, Pull pants up/down(per pt report) Non-skid slipper socks- Performed by patient: Don/doff right sock Non-skid slipper socks- Performed  by helper: Don/doff right sock                  Lower body assist Assist for lower body dressing: (total assist)      Toileting Toileting Toileting activity did not occur: No continent bowel/bladder event Toileting steps completed by patient: Adjust clothing prior to toileting, Adjust clothing after toileting Toileting steps completed by helper: Adjust clothing prior to toileting, Performs perineal hygiene, Adjust clothing after toileting    Toileting assist  Assist level: Touching or steadying assistance (Pt.75%)   Transfers Chair/bed transfer   Chair/bed transfer method: Lateral scoot Chair/bed transfer assist level: Touching or steadying assistance (Pt > 75%) Chair/bed transfer assistive device: Sliding board     Locomotion Ambulation Ambulation activity did not occur: Safety/medical concerns(blister L heel)   Max distance: 533ft Assist level: Moderate assist (Pt 50 - 74%)   Wheelchair   Type: Manual Max wheelchair distance: 150 Assist Level: Supervision or verbal cues  Cognition Comprehension Comprehension assist level: Follows basic conversation/direction with no assist  Expression Expression assist level: Expresses basic needs/ideas: With no assist  Social Interaction Social Interaction assist level: Interacts appropriately 90% of the time - Needs monitoring or encouragement for participation or interaction.  Problem Solving Problem solving assist level: Solves basic problems with no assist  Memory Memory assist level: Recognizes or recalls 90% of the time/requires cueing < 10% of the time    Medical Problem List and Plan: 1.  Decreased functional mobility secondary to left BKA secondary to peripheral vascular disease 07/06/2017. Pt s/p arteriogram, transferred off unit post-procedure. Now back to rehab.    Cont CIR   -Finalizing discharge planning.  Family stating they will not bring her home if she is not emptying her bladder.  Now stating house is not ready.  Plan for d/c tomorrow.   Notes reviewed, discussed with physician, PA, and nursing.    Stump shinker ordered. 2.  DVT Prophylaxis/Anticoagulation: Continue Lovenox   3. Pain Management: Continue tramadol given poor opioid tolerance.    Gabapentin 100 TID started on 4/1 with improvement 4. Mood: Provide emotional support 5. Neuropsych: This patient is not consistently capable of making decisions on her own behalf. 6. Skin/Wound Care: Routine skin checks   Wounds all appear  stable   Appreciate Vasc recs 7. Fluids/Electrolytes/Nutrition: Routine I/O's    Resumed remeron for sleep/appetite 8.  CKD stage III.  Creatinine baseline 1.30-1.59.     Creatinine 1.29 on 4/9   Encourage fluids   Continue to monitor 9.  Hypertension.  Norvasc 10 mg daily   HCTZ 12.5 mg twice daily started on 4/8   Reasonable control at present   Cozaar 50 mg qd   Labile on 4/9 Vitals:   07/25/17 1416 07/26/17 0433  BP: 124/66 (!) 154/79  Pulse: 88 75  Resp: 16 16  Temp: 98 F (36.7 C) 98.6 F (37 C)  SpO2: 97% 97%      10.  History of CVA.  Continue Plavix. 11.  Diet controlled diabetes mellitus.  Hemoglobin A1c 6.0.      No need for CBGs 12. Obesity   Body mass index is 28.75 kg/m.   Diet and exercise education   Encourage weight loss to increase endurance and promote overall health 13. Hypokalemia: Resolved after supplementation 14. Drug induced constipation   Improving   Family notes combo of 2 Senna S and prune juice work   Increased reg on 4/2.   15. Sleep disturbance  Remeron initiated on 3/27   Melatonin 1.5mg  started on 3/27 16. UTI/urine retention   Ucx with Proteus and Pseudomonas  Levaquin completed on Friday.  White count down to 9.6 on 4/9  Still having significant retention.   -Recheck urinalysis unremarkable, urine culture pending  -Cont Urecholine 25 mg 3 times daily  -Out of bed to void, double voids and bladder massage  LOS (Days) 11 A FACE TO FACE EVALUATION WAS PERFORMED  Kwali Wrinkle Karis Juba 07/26/2017 8:43 AM

## 2017-07-27 ENCOUNTER — Inpatient Hospital Stay (HOSPITAL_COMMUNITY): Payer: Medicare Other | Admitting: Physical Therapy

## 2017-07-27 ENCOUNTER — Inpatient Hospital Stay (HOSPITAL_COMMUNITY): Payer: Medicare Other | Admitting: Occupational Therapy

## 2017-07-27 LAB — URINE CULTURE

## 2017-07-27 MED ORDER — BETHANECHOL CHLORIDE 25 MG PO TABS
50.0000 mg | ORAL_TABLET | Freq: Three times a day (TID) | ORAL | Status: DC
  Administered 2017-07-27 – 2017-07-29 (×6): 50 mg via ORAL
  Filled 2017-07-27 (×6): qty 2

## 2017-07-27 NOTE — Progress Notes (Signed)
Occupational Therapy Note  Patient Details  Name: Alfonse Flavorsauline C Jabbour MRN: [id number] Date of Birth: 05/24/1926  Today's Date: 07/27/2017 OT Individual Time: 0900-0930 OT Individual Time Calculation (min): 30 min   No c/o pain  Pt seen this session for family education with one of her daughters . This daughter stated she was nervous about helping her mom as she has not had the opportunity to do any of the transfers.  Demonstrated to daughter (while nursing providing meds to pt) how to position and place a board for a slide board transfer from bed >< chair.   When nursing finished, pt taken into bathroom for therapist to demonstrate with [facility] using grab bar.  Pt will have the same set up at home and can do the transfer with min A.  Demonstrated with pt how to perform sit to stand to RW with min A for clothing management. Pt is not able to stand and release one hand yet for clothing management. Pt toileted B and B and then used a lateral lean to cleanse self independently.  Her daughter repeat demonstrated sit to stand for pulling pants up and a stand pivot back to w/c.   Pt resting in w/c until her next session with daughter in the room with her.  Issabella Rix 07/27/2017, 10:33 AM

## 2017-07-27 NOTE — Progress Notes (Signed)
Physical Therapy Session Note  Patient Details  Name: [name] Petersen MRN: [id number] Date of Birth: 11-05-1926  Today's Date: 07/27/2017 PT Individual Time: 5625 - 1430 AND 1510-1600   45 and 50 min   Short Term Goals: Week 2:  PT Short Term Goal 1 (Week 2): STG=LTG due to ELOS  Skilled Therapeutic Interventions/Progress Updates:   Pt received supine in bed and agreeable to PT. Supine>sit transfer without  assist or cues   Squat pivot to [facility] with min assist and min cues for set up from PT including proper LE placement.   WC mobility instructed by PT with supervision assist and min cues for turn management. Pt reports  That new WC is harder to manage than hospital chair, and if too hight to transfer into. PT adjusted Pt WC to Hemi height to allow improve safety with Sb and squat pivot transfers.   Stand pivot with RW min assist and moderate cues and AD management and LE movement.   BLE therex.  SAQ, BLE Hip abduction BLE Reciprocal marches. BLE Ankle DF on the RLE.  All completed x 15 with 3 second hold at end range. Mi ncues for proper speed to improve strengthening aspect of movements.   Patient returned to room and left sitting in [facility] with call bell in reach and all needs met.     Session 2.   Pt received sitting in WC and agreeable to PT  WC mobility 2 x 136f with supervision assist. PT applied tband to bil wheel rims to improve friction and control of WC in turns. significant improvement in WC control with tbands on rims.   Blocked practice transfer training SB x 6 R and L and squat pivot x 4 R and L. CGA for SB transfer and min assist for squat pivot. Min cues for set up, safety and technique from PT   BUE therex to focus of triceps, middle and lower traps.  Ball taps with 4 lb bar weight, low row, middle row, rope shuttle ( 3 x 1 min). 2 x 15 all other therex with min-mod cues for proper technique and decreased compensation through shoulders and biceps.   [facility]  [facility] from PT and SB set up.   [facility] to [facility] and [facility] and all needs met.             Therapy Documentation Precautions:  Precautions Precautions: Fall Restrictions Weight Bearing Restrictions: Yes LLE Weight Bearing: Non weight bearing Vital Signs: Therapy Vitals Temp: 98.7 F (37.1 C) Temp Source: Oral Pulse Rate: 92 Resp: 18 BP: 135/69 Patient Position (if appropriate): Sitting Oxygen Therapy SpO2: 98 % O2 Device: Room Air Pain: Denies.   See Function Navigator for Current Functional Status.   Therapy/Group: Individual Therapy  ALorie Phenix4/01/2018, 3:26 PM

## 2017-07-27 NOTE — Progress Notes (Signed)
[facility] PHYSICAL MEDICINE & REHABILITATION     PROGRESS NOTE  Subjective/Complaints:  Patient seen lying in bed this morning. She states she slept well overnight. She still confused about having bowel movements thinking that she has not had any. However, daughter states patient is having bowel movements.  ROS: denies CP, SOB, N/V/D   Objective: Vital Signs: Blood pressure (!) 160/82, pulse 78, temperature 98.1 F (36.7 C), temperature source Oral, resp. rate 18, height 5\' 2"  (1.575 m), weight 71.3 kg (157 lb 3 oz), SpO2 96 %. No results found. No results for input(s): WBC, HGB, HCT, PLT in the last 72 hours. Recent Labs    07/26/17 0731  NA 138  K 4.6  CL 104  GLUCOSE 102*  BUN 19  CREATININE 1.29*  CALCIUM 9.1   CBG (last 3)  No results for input(s): GLUCAP in the last 72 hours.  Wt Readings from Last 3 Encounters:  07/15/17 71.3 kg (157 lb 3 oz)  07/15/17 71.3 kg (157 lb 3 oz)  07/11/17 76 kg (167 lb 8.8 oz)    Physical Exam:  BP (!) 160/82 (BP Location: Left Arm)   Pulse 78   Temp 98.1 F (36.7 C) (Oral)   Resp 18   Ht 5\' 2"  (1.575 m)   Wt 71.3 kg (157 lb 3 oz)   SpO2 96%   BMI 28.75 kg/m  Constitutional: No distress . Vital signs reviewed. HEENT: EOMI, oral membranes moist Cardiovascular: RRR. No JVD    Respiratory: CTA Bilaterally.  Normal effort    GI: BS +, non-distended  Musculoskeletal: Stump less sensitive.   Neurological.   Aler tand oriented 3 Follows simple commands Motor:  Right lower extremity: Hip flexion, knee extension 4+/5, ankle dorsiflexion 5/5  Left lower extremity: Hip flexion 4+/5  Skin: left BKA ith dressing C/D/I  Psych: Cooperative.  Assessment/Plan: 1. Functional deficits secondary to left BKA which require 3+ hours per day of interdisciplinary therapy in a comprehensive inpatient rehab setting. Physiatrist is providing close team supervision and 24 hour management of active medical problems listed below. Physiatrist and  rehab team continue to assess barriers to discharge/monitor patient progress toward functional and medical goals.  Function:  Bathing Bathing position Bathing activity did not occur: Refused Position: Bed  Bathing parts Body parts bathed by patient: Right arm, Left arm, Chest, Abdomen, Front perineal area, Right upper leg, Left upper leg, Buttocks Body parts bathed by helper: Buttocks, Right lower leg, Back  Bathing assist Assist Level: Supervision or verbal cues, Set up   Set up : To obtain items  Upper Body Dressing/Undressing Upper body dressing Upper body dressing/undressing activity did not occur: Refused What is the patient wearing?: Pull over shirt/dress     Pull over shirt/dress - Perfomed by patient: Thread/unthread right sleeve, Thread/unthread left sleeve, Put head through opening, Pull shirt over trunk          Upper body assist Assist Level: Set up      Lower Body Dressing/Undressing Lower body dressing Lower body dressing/undressing activity did not occur: Refused What is the patient wearing?: Pants     Pants- Performed by patient: Thread/unthread left pants leg, Pull pants up/down, Thread/unthread right pants leg Pants- Performed by helper: Thread/unthread right pants leg, Thread/unthread left pants leg, Pull pants up/down(per pt report) Non-skid slipper socks- Performed by patient: Don/doff right sock Non-skid slipper socks- Performed by helper: Don/doff right sock  Lower body assist Assist for lower body dressing: Set up   Set up : To obtain clothing/put away  Toileting Toileting Toileting activity did not occur: No continent bowel/bladder event Toileting steps completed by patient: Performs perineal hygiene, Adjust clothing prior to toileting Toileting steps completed by helper: Adjust clothing after toileting    Toileting assist Assist level: Touching or steadying assistance (Pt.75%)   Transfers Chair/bed transfer   Chair/bed  transfer method: Squat pivot Chair/bed transfer assist level: Touching or steadying assistance (Pt > 75%) Chair/bed transfer assistive device: Sliding board     Locomotion Ambulation Ambulation activity did not occur: Safety/medical concerns(blister L heel)   Max distance: 713ft Assist level: Moderate assist (Pt 50 - 74%)   Wheelchair   Type: Manual Max wheelchair distance: 150 Assist Level: Supervision or verbal cues  Cognition Comprehension Comprehension assist level: Follows complex conversation/direction with no assist  Expression Expression assist level: Expresses complex ideas: With no assist  Social Interaction Social Interaction assist level: Interacts appropriately 90% of the time - Needs monitoring or encouragement for participation or interaction.  Problem Solving Problem solving assist level: Solves basic problems with no assist  Memory Memory assist level: Recognizes or recalls 90% of the time/requires cueing < 10% of the time    Medical Problem List and Plan: 1.  Decreased functional mobility secondary to left BKA secondary to peripheral vascular disease 07/06/2017. Pt s/p arteriogram, transferred off unit post-procedure. Now back to rehab.    Cont CIR   Initially family was not willing to be discharged due to urinary retention, now that has improved, family states that patient's bathroom is being remodeled and she does not have caregiver support. They state that they spoke to Medicare and they are a lot to stay for 60 days in IRF (please see social worker note as well).   Stump shinker ordered. 2.  DVT Prophylaxis/Anticoagulation: Continue Lovenox   3. Pain Management: Continue tramadol given poor opioid tolerance.    Gabapentin 100 TID started on 4/1 with improvement 4. Mood: Provide emotional support 5. Neuropsych: This patient is not consistently capable of making decisions on her own behalf. 6. Skin/Wound Care: Routine skin checks   Wounds all appear stable    Appreciate Vasc recs 7. Fluids/Electrolytes/Nutrition: Routine I/O's    Resumed remeron for sleep/appetite 8.  CKD stage III.  Creatinine baseline 1.30-1.59.     Creatinine 1.29 on 4/9   Encourage fluids   Continue to monitor 9.  Hypertension.  Norvasc 10 mg daily   HCTZ 12.5 mg twice daily started on 4/8   Reasonable control at present   Cozaar 50 mg qd   ? Trending up on 4/10 Vitals:   07/26/17 1313 07/27/17 0304  BP: (!) 144/66 (!) 160/82  Pulse: 85 78  Resp: 16 18  Temp: 98.6 F (37 C) 98.1 F (36.7 C)  SpO2: 95% 96%      10.  History of CVA.  Continue Plavix. 11.  Diet controlled diabetes mellitus.  Hemoglobin A1c 6.0.      No need for CBGs 12. Obesity   Body mass index is 28.75 kg/m.   Diet and exercise education   Encourage weight loss to increase endurance and promote overall health 13. Hypokalemia: Resolved after supplementation 14. Drug induced constipation   Improving   Family notes combo of 2 Senna S and prune juice work 15. Sleep disturbance   Remeron initiated on 3/27   Melatonin 1.5mg  started on 3/27 16. UTI/urine retention  Ucx with Proteus and Pseudomonas  Levaquin completed on Friday.  White count down to 9.6 on 4/9  Still having significant retention.   -Recheck urinalysis unremarkable, urine culture suggesting multiple species  -Cont Urecholine 25 mg 3 times daily, increased to 50 on 4/10  -Out of bed to void, double voids and bladder massage  LOS (Days) 12 A FACE TO FACE EVALUATION WAS PERFORMED  Earl Zellmer Karis Juba 07/27/2017 8:19 AM

## 2017-07-27 NOTE — Progress Notes (Signed)
Occupational Therapy Session Note  Patient Details  Name: [name] Petersen MRN: [id number] Date of Birth: 02/28/1927  Today's Date: 07/27/2017 OT Individual Time: [id number] OT Individual Time Calculation (min): 38 min    Short Term Goals: Week 2:  OT Short Term Goal 1 (Week 2): STG = LTGs due to remaining LOS  Skilled Therapeutic Interventions/Progress Updates:    Upon entering the room, pt seated on drop arm commode chair with NT and pt's daughters present in room. Pt having BM this session. Pt able to perform hygiene with supervision and set up to obtain materials. Stand pivot with mod A back to bed to change LB clothing with focus on lateral leans to pull pants over B hips with increased time and min A. Slide board transfer from bed > wheelchair with steady assistance. Pt seated in wheelchair for grooming tasks at supervision/set up level. Pt remained in wheelchair at end of session with family present in room.  Therapy Documentation Precautions:  Precautions Precautions: Fall Restrictions Weight Bearing Restrictions: Yes LLE Weight Bearing: Non weight bearing  See Function Navigator for Current Functional Status.   Therapy/Group: Individual Therapy  Alen BleacherBradsher, Jonluke Cobbins P 07/27/2017, 8:50 AM

## 2017-07-27 NOTE — Progress Notes (Signed)
Occupational Therapy Session Note  Patient Details  Name: [name] Petersen MRN: [id number] Date of Birth: 09/01/1926  Today's Date: 07/27/2017 OT Individual Time: 1005-1100 OT Individual Time Calculation (min): 55 min    Short Term Goals: Week 2:  OT Short Term Goal 1 (Week 2): STG = LTGs due to remaining LOS  Skilled Therapeutic Interventions/Progress Updates:    Treatment session with focus on hands on family education with pt and pt's daughter, Elita Quickam.  Pt reports concerns with transferring to toilet in bathroom at home, expressing desire to have daughter practice transfers to drop arm BSC.  Educated on squat pivot and slide board transfers as pt will most likely use both at home.  Pt's daughter completed all transfers throughout session progressing from requiring min cues to supervision with placement of slide board.  Pt able to direct care to ensure proper placement of slide board.  Pam provided min assist to min guard during slide board transfers and min assist with squat pivot transfers.  Pt continues to express concerns about Rt foot sliding during transfers, educated on improved placement of RLE to decrease risk of sliding and to allow daughter to block LE as needed.  Pt reports having voided urine during transfer training, therefore pt doffed pants utilizing lateral leans on BSC and then standing with RW and min guard to allow daughter to assist with hygiene and pulling pants over hips.  Min cues for hand placement during sit > stand to increase safety.  Pt transferred back bed via squat pivot with min assist from daughter and returned to supine in bed.  Pt left with all needs in reach and daughter present.  Both report that they feel more confident with transfers and standing for hygiene post toileting.  Therapy Documentation Precautions:  Precautions Precautions: Fall Restrictions Weight Bearing Restrictions: Yes LLE Weight Bearing: Non weight bearing Pain:  Pt with no c/o pain this  session.  See Function Navigator for Current Functional Status.   Therapy/Group: Individual Therapy  Rosalio LoudHOXIE, Anahla Bevis 07/27/2017, 12:51 PM

## 2017-07-28 ENCOUNTER — Inpatient Hospital Stay (HOSPITAL_COMMUNITY): Payer: Medicare Other | Admitting: Physical Therapy

## 2017-07-28 ENCOUNTER — Inpatient Hospital Stay (HOSPITAL_COMMUNITY): Payer: Medicare Other | Admitting: Occupational Therapy

## 2017-07-28 ENCOUNTER — Inpatient Hospital Stay (HOSPITAL_COMMUNITY): Payer: Medicare Other

## 2017-07-28 NOTE — Discharge Summary (Signed)
Discharge summary job # [id number]

## 2017-07-28 NOTE — Progress Notes (Signed)
Physical Therapy Discharge Summary  Patient Details  Name: [name] Petersen MRN: [id number] Date of Birth: [birth year]  Today's Date: 07/28/2017 PT Individual Time: 6761-9509 AND 805-905 PT Individual Time Calculation (min): 30 min AND 7mn   Patient has met 7 of 7 long term goals due to improved activity tolerance, improved balance, improved postural control, increased strength, increased range of motion, decreased pain and ability to compensate for deficits.  Patient to discharge at a wheelchair level Supervision.   Patient's care partner is independent to provide the necessary physical assistance at discharge.  Reasons goals not met: All PT goals met   Recommendation:  Patient will benefit from ongoing skilled PT services in home health setting to continue to advance safe functional mobility, address ongoing impairments in transfers, balance, strength, safety awareness,  WC mobility, and minimize fall risk.  Equipment: WC, SB  Reasons for discharge: treatment goals met and discharge from hospital  Patient/family agrees with progress made and goals achieved: Yes   Session 1:  Pt received sitting in WC and agreeable to PT. PT instructed pt in Grad day assessment to measure progress toward goals. See below for details. [facility] returned to room and [facility] in reach and all needs met.      SPima  Pt received supine in bed and agreeable to PT. Supine>sit transfer with supervision assist and min cues for technique. [facility] min assist and min cues for proper UE placement and anterior weight shift.   WC mobility instructed by PT x [id number] supervision assist and min cues for doorway management and WC parts management.   Sit<>stand x 3 with standing tolerance 3 x 1 minute at parallel bars. Min cues from PT for posture and LE positioning to improve safety.   Pt returned to room and performed squat pivot transfer to bed with supervision  assist. Sit>supine completed without assist, and pt left supine in bed with call bell in reach and all needs met.      PT Discharge Precautions/Restrictions Precautions Precautions: Fall Restrictions Weight Bearing Restrictions: Yes LLE Weight Bearing: Non weight bearing Pain Pain Assessment Pain Scale: 0-10 Pain Score: 0-No pain Pain Type: Surgical pain Pain Location: Leg Pain Orientation: Left Pain Descriptors / Indicators: Aching Pain Frequency: Constant Pain Onset: On-going Patients Stated Pain Goal: 2 Pain Intervention(s): Medication (See eMAR) Vision/Perception  Vision - Assessment Additional Comments: Pt reports Macular Degeneration in Lt eye and reports worsening of blurriness in "good" Rt eye Perception Perception: Within Functional Limits Praxis Praxis: Intact  Cognition Overall Cognitive Status: Within Functional Limits for tasks assessed Arousal/Alertness: Awake/alert Orientation Level: Oriented X4 Memory: Impaired Memory Impairment: Decreased recall of new information Awareness: Appears intact Problem Solving: Appears intact Safety/Judgment: Appears intact Sensation Sensation Light Touch: Appears Intact Proprioception: Appears Intact Coordination Gross Motor Movements are Fluid and Coordinated: Yes Fine Motor Movements are Fluid and Coordinated: Yes Motor  Motor Motor: Within Functional Limits Motor - Discharge Observations: decreased strength  Mobility Bed Mobility Bed Mobility: Rolling Right;Rolling Left;Sit to Supine;Supine to Sit Rolling Right: 6: Modified independent (Device/Increase time) Rolling Left: 6: Modified independent (Device/Increase time) Supine to Sit: 5: Supervision Sit to Supine: 6: Modified independent (Device/Increase time) Transfers Transfers: Yes Sit to Stand: 4: Min asTeacher, English as a foreign languageransfers: 4: Min assist Lateral/Scoot Transfers: 5: Supervision(SB set up. ) Locomotion  Ambulation Ambulation: No( Transfers only  per MD) Stairs / Additional Locomotion Stairs: No WhArchitectYes Wheelchair  Assistance: 5: Careers information officer: Both upper extremities Wheelchair Parts Management: Supervision/cueing  Trunk/Postural Assessment  Cervical Assessment Cervical Assessment: Within Functional Limits Thoracic Assessment Thoracic Assessment: Within Functional Limits Lumbar Assessment Lumbar Assessment: Within Functional Limits Postural Control Postural Control: Within Functional Limits Protective Responses: Improved righting reactions in sitting  Balance Static Sitting Balance Static Sitting - Level of Assistance: 6: Modified independent (Device/Increase time) Dynamic Sitting Balance Dynamic Sitting - Level of Assistance: 5: Stand by assistance Sitting balance - Comments: supervision assist with reaching outside BOS Static Standing Balance Static Standing - Level of Assistance: 5: Stand by assistance Dynamic Standing Balance Dynamic Standing - Level of Assistance: 4: Min assist Extremity Assessment  RUE Assessment RUE Assessment: Within Functional Limits LUE Assessment LUE Assessment: Exceptions to WFL(shoulder flexion limited to 80* before pain, due to pt report of "straining" it while pulling up in hospital bed) RLE Assessment RLE Assessment: Within Functional Limits RLE Strength RLE Overall Strength Comments: grossly 5/5 hip flex, knee extension, ankle DF LLE Strength LLE Overall Strength Comments: grossly 4/5 hip flexion/extension/adduction/abduction. 3/5 kne flexion/extension   See Function Navigator for Current Functional Status.  Lorie Phenix 07/28/2017, 1:33 PM

## 2017-07-28 NOTE — Progress Notes (Signed)
Occupational Therapy Discharge Summary  Patient Details  Name: [name] Petersen MRN: [id number] Date of Birth: [birth year]  Patient has met 6 of 7 long term goals due to improved activity tolerance, improved balance, postural control, ability to compensate for deficits and improved awareness.  Patient to discharge at [facility] Assist level.  Patient's care partner is independent to provide the necessary physical assistance at discharge.  Patient's daughters Langley Gauss and Jeannene Patella have completed hands on education with transfers and toileting during OT sessions and have demonstrated safety with each.  Pt's daughter Langley Gauss observed bathing at shower level - recommend HHOT present for initial shower at home; pt able to complete bathing and dressing at bed level with setup.    Reasons goals not met: Pt currently mod assist with toileting as pt is safest standing with RW with min guard while family assists with clothing management/hygiene.  Pt can complete hygiene in sitting and doffing clothing with lateral leans but prefers to stand to pull pants back up therefore requiring increased assist.  Recommendation:  Patient will benefit from ongoing skilled OT services in home health setting to continue to advance functional skills in the area of BADL and Reduce care partner burden.  Equipment: hospital bed, wide drop arm BSC  Reasons for discharge: treatment goals met and discharge from hospital  Patient/family agrees with progress made and goals achieved: Yes  OT Discharge Precautions/Restrictions  Precautions Precautions: Fall Restrictions Weight Bearing Restrictions: Yes LLE Weight Bearing: Non weight bearing Pain Pain Assessment Pain Scale: 0-10 Pain Score: 0-No pain Pain Type: Surgical pain Pain Location: Leg Pain Orientation: Left Pain Descriptors / Indicators: Aching Pain Frequency: Constant Pain Onset: On-going Patients Stated Pain Goal: 2 Pain Intervention(s): Medication (See eMAR) ADL  See Function Navigator Vision Baseline Vision/History: Macular Degeneration Patient Visual Report: No change from baseline Vision Assessment?: Vision impaired- to be further tested in functional context Additional Comments: Pt reports Macular Degeneration in Lt eye and reports worsening of blurriness in "good" Rt eye Perception  Perception: Within Functional Limits Praxis Praxis: Intact Cognition Overall Cognitive Status: Within Functional Limits for tasks assessed Arousal/Alertness: Awake/alert Orientation Level: Oriented X4 Memory: Impaired Memory Impairment: Decreased recall of new information Awareness: Appears intact Problem Solving: Appears intact Safety/Judgment: Appears intact Sensation Sensation Light Touch: Appears Intact Proprioception: Appears Intact Coordination Gross Motor Movements are Fluid and Coordinated: Yes Fine Motor Movements are Fluid and Coordinated: Yes Extremity/Trunk Assessment RUE Assessment RUE Assessment: Within Functional Limits LUE Assessment LUE Assessment: Exceptions to WFL(shoulder flexion limited to 80* before pain, due to pt report of "straining" it while pulling up in hospital bed)   See Function Navigator for Current Functional Status.  Simonne Come 07/28/2017, 1:01 PM

## 2017-07-28 NOTE — Progress Notes (Signed)
Occupational Therapy Session Note  Patient Details  Name: [name] Petersen MRN: [id number] Date of Birth: 08/20/1926  Today's Date: 07/28/2017 OT Individual Time: 1003-1100 and 1500-1530 OT Individual Time Calculation (min): 57 min and 30 min   Short Term Goals: Week 2:  OT Short Term Goal 1 (Week 2): STG = LTGs due to remaining LOS  Skilled Therapeutic Interventions/Progress Updates:    1) Completed ADL retraining at overall min assist level.  Pt received upright in w/c with daughter, Angelique BlonderDenise, present asking questions about shower.  Therapist reiterated pt expressing no desire to take shower therefore had not emphasized bathing at shower level and strongly encouraged pt to attempt and problem solve with HHOT when pt ready.  Pt's daughter encouraged pt to attempt shower transfer and bathing this session with pt finally willing.  Completed squat pivot transfer w/c > tub bench in room shower with min guard with use of grab bars.  Pt reports need to have BM, therefore transferred squat pivot to drop arm commode placed next to tub bench.  Min guard sit > stand to allow therapist to complete hygiene while pt maintained standing with UE support on grab bars.  Pt returned to shower min guard and completed bathing with lateral leans with supervision, therapist having cleansed buttocks post BM.  Pt completed UB dressing seated in w/c with setup assist.  Transferred to bed squat pivot with min guard and pt completed LB dressing at bed level with setup.  Pt remained supine in bed as she reports fatigue from shower.    Therapist continuing to recommend that pt take initial shower [facility] to ensure safety and problem solve appropriate setup of DME.  2) Treatment session with focus on d/c planning and therapeutic exercise.  Pt received supine in bed with daughter, Angelique BlonderDenise present.  Pt and daughter report no further questions regarding transfers and self-care tasks.  Pt asking about exercises for continued  strengthening and ROM.  Reiterated exercise HEP provided to patient and completed each exercise with pt with min cues for technique.  Encouraged pt to complete all 5 exercises 2x/day.  Reiterated massage and desensitization strategies to residual limb in preparation for prosthetic and to promote healing. Pt and daughter report understanding and no further questions.  Therapy Documentation Precautions:  Precautions Precautions: Fall Restrictions Weight Bearing Restrictions: Yes LLE Weight Bearing: Non weight bearing Pain: Pain Assessment Pain Scale: 0-10 Pain Score: 0-No pain   See Function Navigator for Current Functional Status.   Therapy/Group: Individual Therapy  Rosalio LoudHOXIE, Ashni Lonzo 07/28/2017, 12:16 PM

## 2017-07-28 NOTE — Progress Notes (Signed)
Occupational Therapy Session Note  Patient Details  Name: [name] Petersen MRN: [id number] Date of Birth: 05/05/1926  Today's Date: 07/28/2017 OT Individual Time: 1127-1202 OT Individual Time Calculation (min): 35 min    Short Term Goals: Week 2:  OT Short Term Goal 1 (Week 2): STG = LTGs due to remaining LOS  Skilled Therapeutic Interventions/Progress Updates:    Pt greeted supine in room, no c/o pain. Extensive discussion with pt re d/c planning. Brainstorming with pt re strategies to reduce R foot slippage during sit to stand transfers and stand pivot transfers, including continuing previously provided family ed re blocking foot, and the use of non-skid mats. Discussion with pt re engagement in IADL and leisure tasks to promote meaningful engagement in hobbies, etc. Pt identified gardening as a preferred leisure task, with OT problem solving with pt through fully participating in horticulture task and reaffirming importance of IADL participation for self-efficacy. Pt then completed 4x sit to stand transfers from EOB, requiring (S) to min A depending on pt fatigue. Pt very anxious to stand, insisting on R foot blocking to reduce foot slippage. Pt encouraged to remove 1 UE from RW, with pt unable to attempt d/t fear of falling. Discussion with pt and family present on continuing to practice sit to stand transfers with (S) at home to improve standing tolerance and progress to I ADL transfers. Pt left in supine with bed alarm set.   Therapy Documentation Precautions:  Precautions Precautions: Fall Restrictions Weight Bearing Restrictions: Yes LLE Weight Bearing: Non weight bearing Pain: Pain Assessment Pain Scale: 0-10 Pain Score: 0-No pain Pain Type: Surgical pain Pain Location: Leg Pain Orientation: Left Pain Descriptors / Indicators: Aching Pain Frequency: Constant Pain Onset: On-going Patients Stated Pain Goal: 2 Pain Intervention(s): Medication (See eMAR) Vision Baseline  Vision/History: Macular Degeneration Patient Visual Report: No change from baseline Vision Assessment?: Vision impaired- to be further tested in functional context Perception  Perception: Within Functional Limits Praxis Praxis: Intact  See Function Navigator for Current Functional Status.   Therapy/Group: Individual Therapy  Crissie ReeseSandra H Loral Campi 07/28/2017, 12:04 PM

## 2017-07-28 NOTE — Progress Notes (Signed)
[facility] PHYSICAL MEDICINE & REHABILITATION     PROGRESS NOTE  Subjective/Complaints:  Patient seen sitting up at the edge of her bed this morning. Daughter at bedside. Patient states she did not sleep well overnight due to pain, however daughter shows disagreement suggest that patient did not indicate that she was in pain nor that she did not sleep well. Reeducated patient regarding coping mechanisms for pain.  ROS: denies CP, SOB, N/V/D   Objective: Vital Signs: Blood pressure (!) 151/68, pulse 79, temperature 98.1 F (36.7 C), temperature source Oral, resp. rate 18, height 5\' 2"  (1.575 m), weight 71.3 kg (157 lb 3 oz), SpO2 97 %. No results found. No results for input(s): WBC, HGB, HCT, PLT in the last 72 hours. Recent Labs    07/26/17 0731  NA 138  K 4.6  CL 104  GLUCOSE 102*  BUN 19  CREATININE 1.29*  CALCIUM 9.1   CBG (last 3)  No results for input(s): GLUCAP in the last 72 hours.  Wt Readings from Last 3 Encounters:  07/15/17 71.3 kg (157 lb 3 oz)  07/15/17 71.3 kg (157 lb 3 oz)  07/11/17 76 kg (167 lb 8.8 oz)    Physical Exam:  BP (!) 151/68 (BP Location: Left Arm)   Pulse 79   Temp 98.1 F (36.7 C) (Oral)   Resp 18   Ht 5\' 2"  (1.575 m)   Wt 71.3 kg (157 lb 3 oz)   SpO2 97%   BMI 28.75 kg/m  Constitutional: No distress . Vital signs reviewed. HEENT: EOMI, oral membranes moist Cardiovascular: RRR. No JVD    Respiratory: CTA Bilaterally.  Normal effort    GI: BS +, non-distended  Musculoskeletal: Stump less sensitive.   Neurological.   Aler tand oriented 3 Follows simple commands Motor:  Right lower extremity: Hip flexion, knee extension 4+/5, ankle dorsiflexion 5/5  Left lower extremity: Hip flexion 4+/5 (stable) Skin: left BKA ith dressing C/D/I  Psych: Cooperative.  Assessment/Plan: 1. Functional deficits secondary to left BKA which require 3+ hours per day of interdisciplinary therapy in a comprehensive inpatient rehab setting. Physiatrist is  providing close team supervision and 24 hour management of active medical problems listed below. Physiatrist and rehab team continue to assess barriers to discharge/monitor patient progress toward functional and medical goals.  Function:  Bathing Bathing position Bathing activity did not occur: Refused Position: Bed  Bathing parts Body parts bathed by patient: Right arm, Left arm, Chest, Abdomen, Front perineal area, Right upper leg, Left upper leg, Buttocks Body parts bathed by helper: Buttocks, Right lower leg, Back  Bathing assist Assist Level: Supervision or verbal cues, Set up   Set up : To obtain items  Upper Body Dressing/Undressing Upper body dressing Upper body dressing/undressing activity did not occur: Refused What is the patient wearing?: Pull over shirt/dress     Pull over shirt/dress - Perfomed by patient: Thread/unthread right sleeve, Thread/unthread left sleeve, Put head through opening, Pull shirt over trunk          Upper body assist Assist Level: Set up      Lower Body Dressing/Undressing Lower body dressing Lower body dressing/undressing activity did not occur: Refused What is the patient wearing?: Pants     Pants- Performed by patient: Thread/unthread left pants leg, Pull pants up/down, Thread/unthread right pants leg Pants- Performed by helper: Thread/unthread right pants leg, Thread/unthread left pants leg, Pull pants up/down(per pt report) Non-skid slipper socks- Performed by patient: Don/doff right sock Non-skid slipper socks-  Performed by helper: Don/doff right sock                  Lower body assist Assist for lower body dressing: Set up   Set up : To obtain clothing/put away  Toileting Toileting Toileting activity did not occur: No continent bowel/bladder event Toileting steps completed by patient: Performs perineal hygiene, Adjust clothing prior to toileting, Adjust clothing after toileting Toileting steps completed by helper: Adjust  clothing after toileting    Toileting assist Assist level: Touching or steadying assistance (Pt.75%), Supervision or verbal cues   Transfers Chair/bed transfer   Chair/bed transfer method: Squat pivot Chair/bed transfer assist level: Touching or steadying assistance (Pt > 75%) Chair/bed transfer assistive device: Sliding board     Locomotion Ambulation Ambulation activity did not occur: Safety/medical concerns(blister L heel)   Max distance: 673ft Assist level: Moderate assist (Pt 50 - 74%)   Wheelchair   Type: Manual Max wheelchair distance: 150 Assist Level: Supervision or verbal cues  Cognition Comprehension Comprehension assist level: Follows complex conversation/direction with no assist  Expression Expression assist level: Expresses complex ideas: With no assist  Social Interaction Social Interaction assist level: Interacts appropriately 90% of the time - Needs monitoring or encouragement for participation or interaction.  Problem Solving Problem solving assist level: Solves basic problems with no assist  Memory Memory assist level: Recognizes or recalls 90% of the time/requires cueing < 10% of the time    Medical Problem List and Plan: 1.  Decreased functional mobility secondary to left BKA secondary to peripheral vascular disease 07/06/2017. Pt s/p arteriogram, transferred off unit post-procedure. Now back to rehab.    Cont CIR   Initially family was not willing to be discharged due to urinary retention, now that has improved, family states that patient's bathroom is being remodeled and she does not have caregiver support. They state that they spoke to Medicare and they are a lot to stay for 60 days in IRF (please see social worker note as well).   Stump shinker. 2.  DVT Prophylaxis/Anticoagulation: Continue Lovenox   3. Pain Management: Continue tramadol given poor opioid tolerance.    Gabapentin 100 TID started on 4/1 with improvement 4. Mood: Provide emotional support 5.  Neuropsych: This patient is not consistently capable of making decisions on her own behalf. 6. Skin/Wound Care: Routine skin checks   Wounds all appear stable   Appreciate Vasc recs 7. Fluids/Electrolytes/Nutrition: Routine I/O's    DC'd Remeron per daughter request on 4/11 8.  CKD stage III.  Creatinine baseline 1.30-1.59.     Creatinine 1.29 on 4/9   Encourage fluids   Continue to monitor 9.  Hypertension.  Norvasc 10 mg daily   HCTZ 12.5 mg twice daily started on 4/8   Reasonable control at present   Cozaar 50 mg qd   Slightly labile on 4/11 Vitals:   07/27/17 1431 07/28/17 0518  BP: 135/69 (!) 151/68  Pulse: 92 79  Resp: 18 18  Temp: 98.7 F (37.1 C) 98.1 F (36.7 C)  SpO2: 98% 97%      10.  History of CVA.  Continue Plavix. 11.  Diet controlled diabetes mellitus.  Hemoglobin A1c 6.0.      No need for CBGs 12. Obesity   Body mass index is 28.75 kg/m.   Diet and exercise education   Encourage weight loss to increase endurance and promote overall health 13. Hypokalemia: Resolved after supplementation 14. Drug induced constipation   Improving   Family  notes combo of 2 Senna S and prune juice work 15. Sleep disturbance   Remeron initiated on 3/27   Melatonin 1.5mg  started on 3/27 16. UTI/urine retention: Improved   Ucx with Proteus and Pseudomonas  Levaquin completed on Friday.  White count down to 9.6 on 4/9  Still having significant retention.   -Recheck urinalysis unremarkable, urine culture suggesting multiple species  -Cont Urecholine 25 mg 3 times daily, increased to 50 on 4/10  -Out of bed to void, double voids and bladder massage  LOS (Days) 13 A FACE TO FACE EVALUATION WAS PERFORMED  Landa Mullinax Karis Juba 07/28/2017 9:02 AM

## 2017-07-29 MED ORDER — LOSARTAN POTASSIUM 50 MG PO TABS: 50.0000 mg | ORAL_TABLET | Freq: Every day | ORAL | 0 refills | Status: DC

## 2017-07-29 MED ORDER — OXYCODONE-ACETAMINOPHEN 5-325 MG PO TABS: 1.0000 | ORAL_TABLET | Freq: Four times a day (QID) | ORAL | 0 refills | Status: DC | PRN

## 2017-07-29 MED ORDER — TRAMADOL HCL 50 MG PO TABS: 50.0000 mg | ORAL_TABLET | Freq: Three times a day (TID) | ORAL | 0 refills | Status: DC | PRN

## 2017-07-29 MED ORDER — METHOCARBAMOL 500 MG PO TABS: 500.0000 mg | ORAL_TABLET | Freq: Four times a day (QID) | ORAL | 0 refills | Status: DC | PRN

## 2017-07-29 MED ORDER — GABAPENTIN 100 MG PO CAPS: 100.0000 mg | ORAL_CAPSULE | Freq: Three times a day (TID) | ORAL | 0 refills | Status: DC

## 2017-07-29 MED ORDER — HYDROCHLOROTHIAZIDE 12.5 MG PO CAPS: 12.5000 mg | ORAL_CAPSULE | Freq: Two times a day (BID) | ORAL | 0 refills | Status: DC

## 2017-07-29 MED ORDER — SENNOSIDES-DOCUSATE SODIUM 8.6-50 MG PO TABS: 2.0000 | ORAL_TABLET | Freq: Every day | ORAL | 0 refills | Status: DC

## 2017-07-29 MED ORDER — ACETAMINOPHEN 325 MG PO TABS: 325.0000 mg | ORAL_TABLET | Freq: Four times a day (QID) | ORAL | Status: DC | PRN

## 2017-07-29 MED ORDER — BETHANECHOL CHLORIDE 50 MG PO TABS: 50.0000 mg | ORAL_TABLET | Freq: Three times a day (TID) | ORAL | 0 refills | Status: DC

## 2017-07-29 MED ORDER — POLYVINYL ALCOHOL 1.4 % OP SOLN: 1.0000 [drp] | OPHTHALMIC | 0 refills | Status: DC | PRN

## 2017-07-29 MED ORDER — POLYETHYLENE GLYCOL 3350 17 G PO PACK: 17.0000 g | PACK | Freq: Every day | ORAL | 0 refills | Status: DC | PRN

## 2017-07-29 MED ORDER — AMLODIPINE BESYLATE 10 MG PO TABS: 10.0000 mg | ORAL_TABLET | Freq: Every day | ORAL | 0 refills | Status: DC

## 2017-07-29 NOTE — Discharge Summary (Addendum)
NAMCarlos Petersen:  Petersen, [name]                ACCOUNT NO.:  [id number]  MEDICAL RECORD NO.:  [id number]  LOCATION:                                 FACILITY:  PHYSICIAN:  Maryla MorrowAnkit Chia Mowers, MD        DATE OF BIRTH:  1926/11/21  DATE OF ADMISSION:  07/18/2017 DATE OF DISCHARGE:  07/29/2017                              DISCHARGE SUMMARY   DISCHARGE DIAGNOSES: 1. Left below-knee amputation secondary to peripheral vascular     disease. 2. Subcutaneous Lovenox for deep venous thrombosis prophylaxis. 3. Pain management, mood. 4. Chronic kidney disease, stage 3. 5. Hypertension. 6. Urinary retention. 7. History of cerebrovascular accident. 8. Diet-controlled diabetes mellitus. 9. Sleep disturbance. 10.Drug-induced constipation.  HISTORY OF PRESENT ILLNESS:  This is an 84 year old, right-handed female with history of CVA, maintained on Plavix, hypertension, diet-controlled diabetes mellitus, CKD stage 3.  She lives alone, was independent until January, when she began using a walker.  Presented on July 06, 2017, with critical limb ischemia, left lower extremity, with non- reconstructible distal tibial disease, no change with conservative care. Underwent left BKA, July 06, 2017, per Dr. Arbie CookeyEarly.  Hospital course, pain management.  Monitoring creatinine, 1.97 from baseline 1.38 to 1.59.  Her Cozaar was held.  Subcutaneous heparin for DVT prophylaxis. The patient was admitted for comprehensive rehab program.  PAST MEDICAL HISTORY:  See discharge diagnoses.  SOCIAL HISTORY:  Lives alone, supportive family.  FUNCTIONAL STATUS:  Upon admission to rehab services, she had been using a rolling walker prior to admission.  PHYSICAL EXAMINATION:  VITAL SIGNS:  Blood pressure 140/65, pulse 65, temperature 99, respirations 18. GENERAL:  Alert female, in no acute distress. HEENT:  EOMs intact. NECK:  Supple.  Nontender.  No JVD. CARDIAC:  Rate controlled. ABDOMEN:  Soft, nontender.  Good bowel sounds. LUNGS:   Clear to auscultation.  No wheeze. EXTREMITIES:  Amputation site was dressed, appropriately tender.  REHABILITATION HOSPITAL COURSE:  The patient was admitted to Inpatient Rehab Services.  Therapies initiated on a 3-hour daily basis, consisting of physical therapy, occupational therapy, and rehabilitation nursing. The following issues were addressed during the patient's rehabilitation stay.  Pertaining to [name] Petersen's left BKA, surgical site healing nicely. She would follow up with Vascular Surgery.  She was maintained on subcutaneous Lovenox for DVT prophylaxis.  Pain management with the use of Neurontin 100 mg 3 times daily as well as oxycodone 1 tablet every 6 hours as needed for pain, also using Ultram 100 mg every 8 hours as needed pain.  CKD stage 3, creatinine stabilized, 1.29, blood pressures controlled, monitored with present regimen of Norvasc, low-dose HCTZ and Cozaar resumed.  Blood sugars overall controlled with diet.  Bouts of hypokalemia, resolved with supplement.  Drug-induced constipation, improved with laxative assistance.  During her hospital course, she was treated for a proteus, pseudomonas UTI.  Levaquin completed.  White blood cell count stable at 9.6.  She did have some noted retention.  The patient had noted, prior to hospital admission, nocturia from 4 to 9 times a night, question overflow bladder.  Followup urinalysis study negative.  She was placed on Urecholine, titrated to 50 mg 3 times daily.  Family had refused Foley catheter tube.  They are also refusing in-and-out catheterization education.  She denied any dysuria.  She had some sweating the night prior to discharge.  Medications had recently been adjusted per daughter request. Urine analysis and culture were ordered, however, family took patient to micturate and forgot to the collect the sample.  They requested home urine collection and sending it to the primay care physician for analysis. The patient  received weekly collaborative interdisciplinary team conferences to discuss estimated length of stay, family teaching, any barriers to discharge.  Squat pivot to wheelchair with minimal assistance.  Supine-to-sit transfers without assistance or cues.  Wheelchair mobility,supervision.  Stand pivot with rolling walker, minimal assist and moderate cues, management of some parts, she could propel her wheelchair up to 150 feet.  Blocked practice transfer training, squat pivot minimal cues.  Sliding board transfers back to wheelchair with minimal assistance.  Activities of daily living and homemaking.  Able to perform hygiene with supervision and setup to obtain materials.  Stand pivot with moderate assist, back to the bed to change lower body clothing. Full family teaching was completed and plan discharge to home.  DISCHARGE MEDICATIONS: 1. Norvasc 10 mg p.o. daily. 2. Urecholine 50 mg p.o. t.i.d. 3. Plavix 75 mg p.o. daily. 4. Neurontin 100 mg p.o. t.i.d. 5. HCTZ 12.5 mg p.o. b.i.d. 6. Cozaar 50 mg p.o. daily. 7. Multivitamin daily. 8. MiraLAX twice daily, hold for loose stools. 9. Senokot-S 2 tablets at bedtime. 10.Robaxin 500 mg p.o. every 6 hours as needed muscle spasms. 11.Oxycodone 1 tablet every 6 hours as needed pain. 12.Ultram 100 mg p.o. every 8 hours as needed moderate pain.  Her diet was regular.  She would follow up with Dr. Maryla Morrow at the Outpatient Rehab Service office as directed; Dr. Gretta Began, call for appointment; Dr. Kristian Covey, call for appointment; Dr. Sherril Croon, medical management.     Mariam Dollar, P.A.   ______________________________ Maryla Morrow, MD    DA/MEDQ  D:  07/28/2017  T:  07/28/2017  Job:  [id number]  cc:   Larina Earthly, M.D. Doreen Beam, MD Jorja Loa, M.D. Maryla Morrow, MD

## 2017-07-29 NOTE — Progress Notes (Signed)
Patient ID: [name] Petersen, female   DOB: 10/29/1926, 84 y.o.   MRN: [id number] Doing well overall.  For discharge to home today.  Discussed with the patient and 2 daughters present.  Left below-knee amputation healing.  She is 23 days postop and will have her staples removed today.  She does have some superficial skin loss and an eschar at the incision line.  Her right heel is continuing to demarcate with the pressor wound drying up and remains intact.  Will see in the office in 1 month for follow-up

## 2017-07-29 NOTE — Progress Notes (Addendum)
[facility] PHYSICAL MEDICINE & REHABILITATION     PROGRESS NOTE  Subjective/Complaints:  Pt seen sitting up at the edge of the bed this AM.  She states she did not sleep well overnight due to sweating, which has resolved.  Her daughter has concerns regarding fitting of PRAFO.   ROS: Denies CP, SOB, N/V/D   Objective: Vital Signs: Blood pressure (!) 153/74, pulse 71, temperature 98.5 F (36.9 C), temperature source Oral, resp. rate 17, height 5\' 2"  (1.575 m), weight 71.3 kg (157 lb 3 oz), SpO2 95 %. No results found. No results for input(s): WBC, HGB, HCT, PLT in the last 72 hours. No results for input(s): NA, K, CL, GLUCOSE, BUN, CREATININE, CALCIUM in the last 72 hours.  Invalid input(s): CO CBG (last 3)  No results for input(s): GLUCAP in the last 72 hours.  Wt Readings from Last 3 Encounters:  07/15/17 71.3 kg (157 lb 3 oz)  07/15/17 71.3 kg (157 lb 3 oz)  07/11/17 76 kg (167 lb 8.8 oz)    Physical Exam:  BP (!) 153/74   Pulse 71   Temp 98.5 F (36.9 C) (Oral)   Resp 17   Ht 5\' 2"  (1.575 m)   Wt 71.3 kg (157 lb 3 oz)   SpO2 95%   BMI 28.75 kg/m  Constitutional: No distress . Vital signs reviewed. HEENT: EOMI, oral membranes moist Cardiovascular: RRR. No JVD    Respiratory: CTA Bilaterally.  Normal effort    GI: BS +, non-distended  Musculoskeletal: Stump less sensitive.   Neurological.   Alert and oriented  Follows simple commands Motor:  Right lower extremity: Hip flexion, knee extension 4+/5, ankle dorsiflexion 5/5  Left lower extremity: Hip flexion 4+/5 (slowly improving) Skin: left BKA ith dressing C/D/I  Psych: Cooperative.  Assessment/Plan: 1. Functional deficits secondary to left BKA which require 3+ hours per day of interdisciplinary therapy in a comprehensive inpatient rehab setting. Physiatrist is providing close team supervision and 24 hour management of active medical problems listed below. Physiatrist and rehab team continue to assess barriers to  discharge/monitor patient progress toward functional and medical goals.  Function:  Bathing Bathing position Bathing activity did not occur: Refused Position: Shower  Bathing parts Body parts bathed by patient: Right arm, Left arm, Chest, Abdomen, Front perineal area, Right upper leg, Left upper leg Body parts bathed by helper: Buttocks, Back  Bathing assist Assist Level: Touching or steadying assistance(Pt > 75%)   Set up : To obtain items  Upper Body Dressing/Undressing Upper body dressing Upper body dressing/undressing activity did not occur: Refused What is the patient wearing?: Pull over shirt/dress     Pull over shirt/dress - Perfomed by patient: Thread/unthread right sleeve, Thread/unthread left sleeve, Put head through opening, Pull shirt over trunk          Upper body assist Assist Level: Set up      Lower Body Dressing/Undressing Lower body dressing Lower body dressing/undressing activity did not occur: Refused What is the patient wearing?: Pants, Non-skid slipper socks     Pants- Performed by patient: Thread/unthread left pants leg, Pull pants up/down, Thread/unthread right pants leg Pants- Performed by helper: Thread/unthread right pants leg, Thread/unthread left pants leg, Pull pants up/down(per pt report) Non-skid slipper socks- Performed by patient: Don/doff right sock Non-skid slipper socks- Performed by helper: Don/doff right sock                  Lower body assist Assist for lower body dressing: Set  up   Set up : To obtain clothing/put away  Toileting Toileting Toileting activity did not occur: No continent bowel/bladder event Toileting steps completed by patient: Adjust clothing prior to toileting, Adjust clothing after toileting Toileting steps completed by helper: Performs perineal hygiene    Toileting assist Assist level: Touching or steadying assistance (Pt.75%)   Transfers Chair/bed transfer   Chair/bed transfer method: Lateral  scoot Chair/bed transfer assist level: Supervision or verbal cues Chair/bed transfer assistive device: Armrests, Sliding board     Locomotion Ambulation Ambulation activity did not occur: Safety/medical concerns   Max distance: 26ft Assist level: Moderate assist (Pt 50 - 74%)   Wheelchair   Type: Manual Max wheelchair distance: 113ft  Assist Level: Supervision or verbal cues  Cognition Comprehension Comprehension assist level: Follows complex conversation/direction with no assist  Expression Expression assist level: Expresses complex ideas: With no assist  Social Interaction Social Interaction assist level: Interacts appropriately 90% of the time - Needs monitoring or encouragement for participation or interaction.  Problem Solving Problem solving assist level: Solves basic problems with no assist  Memory Memory assist level: Recognizes or recalls 90% of the time/requires cueing < 10% of the time    Medical Problem List and Plan: 1.  Decreased functional mobility secondary to left BKA secondary to peripheral vascular disease 07/06/2017. Pt s/p arteriogram, transferred off unit post-procedure. Now back to rehab.    D/c today  Will see patient for transitional care management in 1-2 weeks   Stump shinker.   Will request orthotist to evaluate [facility] for possible adjustments 2.  DVT Prophylaxis/Anticoagulation: Continue Lovenox   3. Pain Management: Continue tramadol given poor opioid tolerance.    Gabapentin 100 TID started on 4/1 with improvement 4. Mood: Provide emotional support 5. Neuropsych: This patient is not consistently capable of making decisions on her own behalf. 6. Skin/Wound Care: Routine skin checks   Wounds all appear stable   Appreciate Vasc recs 7. Fluids/Electrolytes/Nutrition: Routine I/O's    DC'd Remeron per daughter request on 4/11 8.  CKD stage III.  Creatinine baseline 1.30-1.59.     Creatinine 1.29 on 4/9   Encourage fluids   Continue to monitor 9.   Hypertension.  Norvasc 10 mg daily   HCTZ 12.5 mg twice daily started on 4/8   Reasonable control at present   Cozaar 50 mg qd   ?Trending up on 4/12, will need ambulatory follow up Vitals:   07/29/17 0619 07/29/17 0826  BP: (!) 156/78 (!) 153/74  Pulse: 71   Resp: 17   Temp: 98.5 F (36.9 C)   SpO2: 95%       10.  History of CVA.  Continue Plavix. 11.  Diet controlled diabetes mellitus.  Hemoglobin A1c 6.0.      No need for CBGs 12. Obesity   Body mass index is 28.75 kg/m.   Diet and exercise education   Encourage weight loss to increase endurance and promote overall health 13. Hypokalemia: Resolved after supplementation 14. Drug induced constipation   Improving   Family notes combo of 2 Senna S and prune juice work 15. Sleep disturbance   Remeron initiated on 3/27   Melatonin 1.5mg  started on 3/27 16. UTI/urine retention: Improved   Ucx with Proteus and Pseudomonas  Levaquin completed on Friday.  White count down to 9.6 on 4/9  Still having significant retention.   -Recheck urinalysis unremarkable, urine culture suggesting multiple species  -Cont Urecholine 25 mg 3 times daily, increased to 50 on  4/10  -Out of bed to void, double voids and bladder massage   Will order repeat UA/Ucx due to sweating at night, however pt's request to d/c medications may have contributed as well  >30 minutes spent in total in counseling and coordination of discharge, please see above issues  LOS (Days) 14 A FACE TO FACE EVALUATION WAS PERFORMED  Lliam Hoh Karis Jubanil Daegen Berrocal 07/29/2017 9:02 AM

## 2017-07-29 NOTE — Discharge Instructions (Signed)
Inpatient Rehab Discharge Instructions  [name] Petersen Discharge date and time: 07/29/17   Activities/Precautions/ Functional Status: Activity: activity as tolerated Diet: regular diet Wound Care: Wash area with soap and water --keep wound clean and dry Contact surgeon if you develop any problems with your incision/wound--redness, swelling, increase in pain, drainage or if you develop fever or chills.    Functional status:  ___ No restrictions     ___ Walk up steps independently _X__ 24/7 supervision/assistance   ___ Walk up steps with assistance ___ Intermittent supervision/assistance  ___ Bathe/dress independently ___ Walk with walker     _xX_ Bathe/dress with assistance ___ Walk Independently    ___ Shower independently ___ Walk with assistance    ___ Shower with assistance _X__ No alcohol     ___ Return to work/school ________    COMMUNITY REFERRALS UPON DISCHARGE:    Home Health:   PT     OT     RN                      [facility]     Phone: [phone]   Medical Equipment/Items Ordered: wheelchair, hospital bed, commode and tranfer board                                                     Agency/Supplier:  [facility] @ [phone]   GENERAL COMMUNITY RESOURCES FOR PATIENT/FAMILY:  Support Groups:  Amputee Support Group (see flyer)    Special Instructions: 1. Continue pressure relief measures   My questions have been answered and I understand these instructions. I will adhere to these goals and the provided educational materials after my discharge from the hospital.  Patient/Caregiver Signature _______________________________ Date __________  Clinician Signature _______________________________________ Date __________  Please bring this form and your medication list with you to all your follow-up doctor's appointments.

## 2017-07-29 NOTE — Progress Notes (Signed)
Prescriptions written for : Percocet 10/325 mg # 30 1 pill every 6 hours prn severe pain and Ultram 50 mg # 30 pills 1-2 every 8 hours prn moderate pain.

## 2017-07-29 NOTE — Progress Notes (Signed)
Patient/family refused to stay until a urine specimen could be obtained.  Patient discharged to home accompanied by her daughters.

## 2017-07-29 NOTE — Progress Notes (Signed)
Social Work  Discharge Note  The overall goal for the admission was met for:   Discharge location: Yes - pt returning to her home with family and private duty caregivers covering 24/7 support  Length of Stay: Yes - 15 days  Discharge activity level: Yes - minimal assist overall  Home/community participation: Yes  Services provided included: MD, RD, PT, OT, RN, TR, Pharmacy and [place]: Medicare and Private Insurance: [place]  Follow-up services arranged: Home Health: RN, PT, OT via [facility], DME: 16x16 lightweight w/c with left amp support pad, cushion, hospital bed, wide drop arm and 30" transfer board via [facility] and Patient/Family has no preference for HH/DME agencies  Comments (or additional information):  Patient/Family verbalized understanding of follow-up arrangements: Yes  Individual responsible for coordination of the follow-up plan: pt  Confirmed correct DME delivered: Madaline Lefeber 07/29/2017    Alona Danford

## 2017-07-29 NOTE — Patient Care Conference (Signed)
Inpatient RehabilitationTeam Conference and Plan of Care Update Date: 07/27/2017   Time: 2:30  PM    Patient Name: [name] Petersen      Medical Record Number: [id number]  Date of Birth: 11/04/1926 Sex: Female         Room/Bed: 4M07C/4M07C-01 Payor Info: Payor: MEDICARE / Plan: MEDICARE PART A AND B / Product Type: *No Product type* /    Admitting Diagnosis: l bka  Admit Date/Time:  07/15/2017  3:57 PM Admission Comments: No comment available   Primary Diagnosis:  Unilateral complete BKA, left, sequela (HCC) Principal Problem: Unilateral complete BKA, left, sequela [facility](HCC)  Patient Active Problem List   Diagnosis Date Noted  . Urinary retention   . Phantom limb pain (HCC)   . Unilateral complete BKA, left, sequela (HCC)   . Slow transit constipation   . Acute lower UTI   . Atherosclerotic PVD with ulceration (HCC) 07/14/2017  . Pressure injury of right heel, stage 3 (HCC)   . Sleep disturbance   . Poor nutrition   . Hypokalemia   . Drug-induced constipation   . Essential hypertension   . Stage 3 chronic kidney disease (HCC)   . Postoperative pain   . Diabetes mellitus type 2 in obese (HCC)   . S/P below knee amputation, left (HCC) 07/10/2017  . PAD (peripheral artery disease) (HCC) 07/06/2017  . Atherosclerosis of native arteries of the extremities with ulceration (HCC) 06/16/2017    Expected Discharge Date: Expected Discharge Date: 07/29/17  Team Members Present: Physician leading conference: Dr. Maryla MorrowAnkit Patel Social Worker Present: Amada JupiterLucy Ivalene Platte, LCSW Nurse Present: Kennyth ArnoldStacey Jennings, RN PT Present: Grier RocherAustin Tucker, PT OT Present: Rosalio LoudSarah Hoxie, OT SLP Present: Jackalyn LombardNicole Page, SLP PPS Coordinator present : Edson SnowballBecky Windsor, PT     Current Status/Progress Goal Weekly Team Focus  Medical   Decreased functional mobility secondary to left BKA secondary to peripheral vascular disease 07/06/2017. Pt s/p arteriogram, transferred off unit post-procedure.   Improve mobility, safety, transfers,  HTN  See above   Bowel/Bladder   LBM 07/26/2017 incontinent at times of B/B  To remain free of constipation   Assess B/B every shift and give miralax twice daily and  (2) Senna daily at bedtime    Swallow/Nutrition/ Hydration             ADL's   supervision/setup bathing and dressing at bed level, min assist transfers with slide board or squat pivot, min assist for standing balance  min assist overall  ADL retraining, toilet transfers, pt/family education   Mobility   Min assist Squat pivot transfer and SB transfers. Supervision assist WC mobility and Bed mobility.   Min assist transfers with LRAD. Supervision bed mobility and transfers  improved Safety and indepence with transfers and WC mobility.     Communication             Safety/Cognition/ Behavioral Observations            Pain   No c/o pain at this time  Pain level below 2  Assess pain every shift and as needed; Give prn meds as indicated to manage pain    Skin   Left BKA   No new areas of skin breakdown   Assess skin every shift and notify MD of any changes      Rehab Goals Patient on target to meet rehab goals: Yes *See Care Plan and progress notes for long and short-term goals.     Barriers to Discharge  Current Status/Progress  Possible Resolutions Date Resolved   Physician    Medical stability;Weight bearing restrictions     See above  Therapies, follow labs, optimize BP meds, supplement K+, Vascular recs      Nursing                  PT                    OT                  SLP                SW                Discharge Planning/Teaching Needs:  home with family to coordinate 24/7 assistance   Teaching being completed this week.   Team Discussion:  Beginning to empty bladder successfully but occ accident.  Have completed most education with daughters.  Better carryover by pt.  Tranfers ~ min assist.  Vascular cont to follow.    Revisions to Treatment Plan:  NA    Continued Need for Acute  Rehabilitation Level of Care: The patient requires daily medical management by a physician with specialized training in physical medicine and rehabilitation for the following conditions: Daily direction of a multidisciplinary physical rehabilitation program to ensure safe treatment while eliciting the highest outcome that is of practical value to the patient.: Yes Daily medical management of patient stability for increased activity during participation in an intensive rehabilitation regime.: Yes Daily analysis of laboratory values and/or radiology reports with any subsequent need for medication adjustment of medical intervention for : Urological problems;Other;Renal problems;Diabetes problems;Blood pressure problems;Post surgical problems;Wound care problems  [name] Petersen 07/29/2017, 1:21 PM

## 2017-08-02 ENCOUNTER — Telehealth: Payer: Self-pay | Admitting: Registered Nurse

## 2017-08-02 ENCOUNTER — Telehealth: Payer: Self-pay | Admitting: Vascular Surgery

## 2017-08-02 DIAGNOSIS — R339 Retention of urine, unspecified: Secondary | ICD-10-CM | POA: Diagnosis not present

## 2017-08-02 DIAGNOSIS — I129 Hypertensive chronic kidney disease with stage 1 through stage 4 chronic kidney disease, or unspecified chronic kidney disease: Secondary | ICD-10-CM | POA: Diagnosis not present

## 2017-08-02 DIAGNOSIS — Z7902 Long term (current) use of antithrombotics/antiplatelets: Secondary | ICD-10-CM | POA: Diagnosis not present

## 2017-08-02 DIAGNOSIS — Z87440 Personal history of urinary (tract) infections: Secondary | ICD-10-CM | POA: Diagnosis not present

## 2017-08-02 DIAGNOSIS — E1122 Type 2 diabetes mellitus with diabetic chronic kidney disease: Secondary | ICD-10-CM | POA: Diagnosis not present

## 2017-08-02 DIAGNOSIS — N183 Chronic kidney disease, stage 3 (moderate): Secondary | ICD-10-CM | POA: Diagnosis not present

## 2017-08-02 DIAGNOSIS — M6281 Muscle weakness (generalized): Secondary | ICD-10-CM | POA: Diagnosis not present

## 2017-08-02 DIAGNOSIS — Z89512 Acquired absence of left leg below knee: Secondary | ICD-10-CM | POA: Diagnosis not present

## 2017-08-02 DIAGNOSIS — Z4781 Encounter for orthopedic aftercare following surgical amputation: Secondary | ICD-10-CM | POA: Diagnosis not present

## 2017-08-02 DIAGNOSIS — L89610 Pressure ulcer of right heel, unstageable: Secondary | ICD-10-CM | POA: Diagnosis not present

## 2017-08-02 DIAGNOSIS — I69398 Other sequelae of cerebral infarction: Secondary | ICD-10-CM | POA: Diagnosis not present

## 2017-08-02 NOTE — Telephone Encounter (Signed)
Transitional Call Placed, no answer to home number. Placed a call to daughter Ms. Pricilla Holmucker, awaiting return call

## 2017-08-02 NOTE — Telephone Encounter (Signed)
LVM for appt 5/14 mld lttr

## 2017-08-02 NOTE — Telephone Encounter (Addendum)
Error

## 2017-08-02 NOTE — Telephone Encounter (Signed)
-----   Message from Sharee PimpleMarilyn K McChesney, RN sent at 07/29/2017  9:24 AM EDT ----- Regarding:  change appt, delay postop    ----- Message ----- From: Larina EarthlyEarly, Todd F, MD Sent: 07/29/2017   9:15 AM To: [facility] Clinical Pool, [facility] Charge Pool  Patient is being discharged from rehab today.  Has an appointment to see me apparently on the 23rd.  This can be pushed back to 1 month from now for follow-up.  She is having her staples removed today in rehab so will just need a wound check in 1 month

## 2017-08-03 ENCOUNTER — Telehealth: Payer: Self-pay

## 2017-08-03 NOTE — Telephone Encounter (Signed)
[name] Petersen called and I spoke to her, she is requesting PT for pt 2wk8. Verbal orders given per discharge note

## 2017-08-03 NOTE — Telephone Encounter (Signed)
Transitional Care call  Patient name: [name] Petersen(Knoth, Jose) DOB: (04/09/1927) 1. Are you/is patient experiencing any problems since coming home? (YES, HAS BEEN HAVING INCREASED SWEATING AND CHILLS SINCE LEFT HOSPITAL, NO FEVER) a. Are there any questions regarding any aspect of care? (NO) 2. Are there any questions regarding medications administration/dosing? (NO) a. Are meds being taken as prescribed? (YES) b. "Patient should review meds with caller to confirm"  3. Have there been any falls? (NO) 4. Has Home Health been to the house and/or have they contacted you? (YES) a. If not, have you tried to contact them? (NA) b. Can we help you contact them? (NA) 5. Are bowels and bladder emptying properly? (YES) a. Are there any unexpected incontinence issues? (NA) b. If applicable, is patient following bowel/bladder programs? (NA) 6. Any fevers, problems with breathing, unexpected pain? (NO) 7. Are there any skin problems or new areas of breakdown? (YES, RIGHT BACK HEEL, WEARING BOOT) 8. Has the patient/family member arranged specialty MD follow up (ie cardiology/neurology/renal/surgical/etc.)?  (YES) a. Can we help arrange? (NO) 9. Does the patient need any other services or support that we can help arrange? (NO) 10. Are caregivers following through as expected in assisting the patient? (YES) 11. Has the patient quit smoking, drinking alcohol, or using drugs as recommended? (NA)  Appointment date/time (08-12-2017 / 1000am), arrive time (940am) and who it is with here () [address]

## 2017-08-04 DIAGNOSIS — H538 Other visual disturbances: Secondary | ICD-10-CM | POA: Diagnosis not present

## 2017-08-04 DIAGNOSIS — J45909 Unspecified asthma, uncomplicated: Secondary | ICD-10-CM | POA: Diagnosis not present

## 2017-08-04 DIAGNOSIS — H353221 Exudative age-related macular degeneration, left eye, with active choroidal neovascularization: Secondary | ICD-10-CM | POA: Diagnosis not present

## 2017-08-04 DIAGNOSIS — Z961 Presence of intraocular lens: Secondary | ICD-10-CM | POA: Diagnosis not present

## 2017-08-04 DIAGNOSIS — H35721 Serous detachment of retinal pigment epithelium, right eye: Secondary | ICD-10-CM | POA: Diagnosis not present

## 2017-08-04 DIAGNOSIS — I10 Essential (primary) hypertension: Secondary | ICD-10-CM | POA: Diagnosis not present

## 2017-08-04 DIAGNOSIS — E7800 Pure hypercholesterolemia, unspecified: Secondary | ICD-10-CM | POA: Diagnosis not present

## 2017-08-04 DIAGNOSIS — Z89512 Acquired absence of left leg below knee: Secondary | ICD-10-CM | POA: Diagnosis not present

## 2017-08-04 DIAGNOSIS — I739 Peripheral vascular disease, unspecified: Secondary | ICD-10-CM | POA: Diagnosis not present

## 2017-08-04 DIAGNOSIS — H43813 Vitreous degeneration, bilateral: Secondary | ICD-10-CM | POA: Diagnosis not present

## 2017-08-04 DIAGNOSIS — Z09 Encounter for follow-up examination after completed treatment for conditions other than malignant neoplasm: Secondary | ICD-10-CM | POA: Diagnosis not present

## 2017-08-04 DIAGNOSIS — E1165 Type 2 diabetes mellitus with hyperglycemia: Secondary | ICD-10-CM | POA: Diagnosis not present

## 2017-08-04 DIAGNOSIS — H353211 Exudative age-related macular degeneration, right eye, with active choroidal neovascularization: Secondary | ICD-10-CM | POA: Diagnosis not present

## 2017-08-04 DIAGNOSIS — H35361 Drusen (degenerative) of macula, right eye: Secondary | ICD-10-CM | POA: Diagnosis not present

## 2017-08-04 DIAGNOSIS — Z6829 Body mass index (BMI) 29.0-29.9, adult: Secondary | ICD-10-CM | POA: Diagnosis not present

## 2017-08-05 DIAGNOSIS — I129 Hypertensive chronic kidney disease with stage 1 through stage 4 chronic kidney disease, or unspecified chronic kidney disease: Secondary | ICD-10-CM | POA: Diagnosis not present

## 2017-08-05 DIAGNOSIS — I69398 Other sequelae of cerebral infarction: Secondary | ICD-10-CM | POA: Diagnosis not present

## 2017-08-05 DIAGNOSIS — Z89512 Acquired absence of left leg below knee: Secondary | ICD-10-CM | POA: Diagnosis not present

## 2017-08-05 DIAGNOSIS — L89610 Pressure ulcer of right heel, unstageable: Secondary | ICD-10-CM | POA: Diagnosis not present

## 2017-08-05 DIAGNOSIS — Z4781 Encounter for orthopedic aftercare following surgical amputation: Secondary | ICD-10-CM | POA: Diagnosis not present

## 2017-08-05 DIAGNOSIS — M6281 Muscle weakness (generalized): Secondary | ICD-10-CM | POA: Diagnosis not present

## 2017-08-08 DIAGNOSIS — I129 Hypertensive chronic kidney disease with stage 1 through stage 4 chronic kidney disease, or unspecified chronic kidney disease: Secondary | ICD-10-CM | POA: Diagnosis not present

## 2017-08-08 DIAGNOSIS — Z89512 Acquired absence of left leg below knee: Secondary | ICD-10-CM | POA: Diagnosis not present

## 2017-08-08 DIAGNOSIS — M6281 Muscle weakness (generalized): Secondary | ICD-10-CM | POA: Diagnosis not present

## 2017-08-08 DIAGNOSIS — Z4781 Encounter for orthopedic aftercare following surgical amputation: Secondary | ICD-10-CM | POA: Diagnosis not present

## 2017-08-08 DIAGNOSIS — I69398 Other sequelae of cerebral infarction: Secondary | ICD-10-CM | POA: Diagnosis not present

## 2017-08-08 DIAGNOSIS — L89610 Pressure ulcer of right heel, unstageable: Secondary | ICD-10-CM | POA: Diagnosis not present

## 2017-08-09 ENCOUNTER — Encounter: Payer: Medicare Other | Admitting: Vascular Surgery

## 2017-08-09 DIAGNOSIS — Z89512 Acquired absence of left leg below knee: Secondary | ICD-10-CM | POA: Diagnosis not present

## 2017-08-09 DIAGNOSIS — Z4781 Encounter for orthopedic aftercare following surgical amputation: Secondary | ICD-10-CM | POA: Diagnosis not present

## 2017-08-09 DIAGNOSIS — M6281 Muscle weakness (generalized): Secondary | ICD-10-CM | POA: Diagnosis not present

## 2017-08-09 DIAGNOSIS — I69398 Other sequelae of cerebral infarction: Secondary | ICD-10-CM | POA: Diagnosis not present

## 2017-08-09 DIAGNOSIS — L89610 Pressure ulcer of right heel, unstageable: Secondary | ICD-10-CM | POA: Diagnosis not present

## 2017-08-09 DIAGNOSIS — I129 Hypertensive chronic kidney disease with stage 1 through stage 4 chronic kidney disease, or unspecified chronic kidney disease: Secondary | ICD-10-CM | POA: Diagnosis not present

## 2017-08-11 DIAGNOSIS — L89610 Pressure ulcer of right heel, unstageable: Secondary | ICD-10-CM | POA: Diagnosis not present

## 2017-08-11 DIAGNOSIS — I129 Hypertensive chronic kidney disease with stage 1 through stage 4 chronic kidney disease, or unspecified chronic kidney disease: Secondary | ICD-10-CM | POA: Diagnosis not present

## 2017-08-11 DIAGNOSIS — Z89512 Acquired absence of left leg below knee: Secondary | ICD-10-CM | POA: Diagnosis not present

## 2017-08-11 DIAGNOSIS — M6281 Muscle weakness (generalized): Secondary | ICD-10-CM | POA: Diagnosis not present

## 2017-08-11 DIAGNOSIS — Z4781 Encounter for orthopedic aftercare following surgical amputation: Secondary | ICD-10-CM | POA: Diagnosis not present

## 2017-08-11 DIAGNOSIS — I69398 Other sequelae of cerebral infarction: Secondary | ICD-10-CM | POA: Diagnosis not present

## 2017-08-12 ENCOUNTER — Encounter: Payer: Medicare Other | Attending: Physical Medicine & Rehabilitation | Admitting: Physical Medicine & Rehabilitation

## 2017-08-12 ENCOUNTER — Other Ambulatory Visit: Payer: Self-pay

## 2017-08-12 ENCOUNTER — Encounter: Payer: Self-pay | Admitting: Physical Medicine & Rehabilitation

## 2017-08-12 VITALS — BP 147/81 | HR 79

## 2017-08-12 DIAGNOSIS — I10 Essential (primary) hypertension: Secondary | ICD-10-CM | POA: Diagnosis not present

## 2017-08-12 DIAGNOSIS — Z8673 Personal history of transient ischemic attack (TIA), and cerebral infarction without residual deficits: Secondary | ICD-10-CM | POA: Diagnosis not present

## 2017-08-12 DIAGNOSIS — S88112A Complete traumatic amputation at level between knee and ankle, left lower leg, initial encounter: Secondary | ICD-10-CM | POA: Insufficient documentation

## 2017-08-12 DIAGNOSIS — I129 Hypertensive chronic kidney disease with stage 1 through stage 4 chronic kidney disease, or unspecified chronic kidney disease: Secondary | ICD-10-CM | POA: Diagnosis not present

## 2017-08-12 DIAGNOSIS — R269 Unspecified abnormalities of gait and mobility: Secondary | ICD-10-CM | POA: Insufficient documentation

## 2017-08-12 DIAGNOSIS — E1122 Type 2 diabetes mellitus with diabetic chronic kidney disease: Secondary | ICD-10-CM | POA: Diagnosis not present

## 2017-08-12 DIAGNOSIS — Z89512 Acquired absence of left leg below knee: Secondary | ICD-10-CM | POA: Insufficient documentation

## 2017-08-12 DIAGNOSIS — Z7902 Long term (current) use of antithrombotics/antiplatelets: Secondary | ICD-10-CM | POA: Diagnosis not present

## 2017-08-12 DIAGNOSIS — I739 Peripheral vascular disease, unspecified: Secondary | ICD-10-CM | POA: Diagnosis not present

## 2017-08-12 DIAGNOSIS — E1151 Type 2 diabetes mellitus with diabetic peripheral angiopathy without gangrene: Secondary | ICD-10-CM | POA: Diagnosis not present

## 2017-08-12 DIAGNOSIS — L89610 Pressure ulcer of right heel, unstageable: Secondary | ICD-10-CM | POA: Diagnosis not present

## 2017-08-12 DIAGNOSIS — X58XXXA Exposure to other specified factors, initial encounter: Secondary | ICD-10-CM | POA: Insufficient documentation

## 2017-08-12 DIAGNOSIS — N183 Chronic kidney disease, stage 3 (moderate): Secondary | ICD-10-CM | POA: Diagnosis not present

## 2017-08-12 DIAGNOSIS — S88112S Complete traumatic amputation at level between knee and ankle, left lower leg, sequela: Secondary | ICD-10-CM

## 2017-08-12 DIAGNOSIS — G8918 Other acute postprocedural pain: Secondary | ICD-10-CM

## 2017-08-12 DIAGNOSIS — R339 Retention of urine, unspecified: Secondary | ICD-10-CM | POA: Diagnosis not present

## 2017-08-12 DIAGNOSIS — H3530 Unspecified macular degeneration: Secondary | ICD-10-CM | POA: Diagnosis not present

## 2017-08-12 MED ORDER — GABAPENTIN 300 MG PO CAPS: 300.0000 mg | ORAL_CAPSULE | Freq: Three times a day (TID) | ORAL | 1 refills | Status: DC

## 2017-08-12 NOTE — Progress Notes (Addendum)
Subjective:    Patient ID: [name] Petersen, female    DOB: 05/06/1926, 84 y.o.   MRN: [id number]  HPI 84 year old, right-handed female with history of CVA, maintained on Plavix, hypertension, diet-controlled diabetes mellitus, CKD stage 3 presents for transitional care management after receiving CIR for left BKA.  DATE OF ADMISSION:  07/18/2017 DATE OF DISCHARGE:  07/29/2017  Daughters present, who supplement history. At discharge, she was instructed to follow up with Vascular, with whom she has an appointment.  She saw her PCP.  Her BP is elevated, but states it is WNL at home. Bowel movements have improved with meds. Sleep has improved.  Her bladder function has returned to normal, but she continues to take urocholine. Denies falls.  Therapies: 2/week Mobility: Wheelchair at all times. DME: Slide board, toilet seat, shower chair.  Pain Inventory Average Pain 3 Pain Right Now 3 My pain is intermittent, sharp, tingling and aching  In the last 24 hours, has pain interfered with the following? General activity n/a Relation with others n/a Enjoyment of life 7 What TIME of day is your pain at its worst? none Sleep (in general) Fair  Pain is worse with: sitting Pain improves with: rest and medication Relief from Meds: 4  Mobility ability to climb steps?  no do you drive?  no use a wheelchair needs help with transfers Do you have any goals in this area?  no  Function retired I need assistance with the following:  dressing, bathing, toileting, meal prep, household duties and shopping  Neuro/Psych bowel control problems  Prior Studies Any changes since last visit?  no  Physicians involved in your care Primary care Dr. Sherril CroonVyas   Family History  Adopted: Yes   Social History   Socioeconomic History  . Marital status: Widowed    Spouse name: Not on file  . Number of children: Not on file  . Years of education: Not on file  . Highest education level: Not on file    Occupational History  . Not on file  Social Needs  . Financial resource strain: Not on file  . Food insecurity:    Worry: Not on file    Inability: Not on file  . Transportation needs:    Medical: Not on file    Non-medical: Not on file  Tobacco Use  . Smoking status: Never Smoker  . Smokeless tobacco: Never Used  Substance and Sexual Activity  . Alcohol use: Never    Frequency: Never  . Drug use: Never  . Sexual activity: Not Currently    Comment: intercourse age 84,sexual patrners less than 5   Lifestyle  . Physical activity:    Days per week: Not on file    Minutes per session: Not on file  . Stress: Not on file  Relationships  . Social connections:    Talks on phone: Not on file    Gets together: Not on file    Attends religious service: Not on file    Active member of club or organization: Not on file    Attends meetings of clubs or organizations: Not on file    Relationship status: Not on file  Other Topics Concern  . Not on file  Social History Narrative  . Not on file   Past Surgical History:  Procedure Laterality Date  . ABDOMINAL AORTOGRAM W/LOWER EXTREMITY N/A 06/16/2017   Procedure: ABDOMINAL AORTOGRAM W/LOWER EXTREMITY;  Surgeon: Fransisco Hertzhen, Brian L, MD;  [facility];  Service:  Cardiovascular;  Laterality: N/A;  lt extermity  . ABDOMINAL HYSTERECTOMY    . AMPUTATION Left 07/06/2017   Procedure: AMPUTATION BELOW KNEE LEFT;  Surgeon: Larina Earthly, MD;  Location: [facility] OR;  Service: Vascular;  Laterality: Left;  . CATARACT EXTRACTION W/ INTRAOCULAR LENS  IMPLANT, BILATERAL    . DILATION AND CURETTAGE OF UTERUS    . LOWER EXTREMITY ANGIOGRAPHY N/A 07/14/2017   Procedure: LOWER EXTREMITY ANGIOGRAPHY;  Surgeon: Chuck Hint, MD;  Location[facility] INVASIVE CV LAB;  Service: Cardiovascular;  Laterality: N/A;   Past Medical History:  Diagnosis Date  . Arthritis    hands  . Atherosclerotic PVD with ulceration (HCC) 07/15/2017   RIGHT HEEL  .  Complication of anesthesia    lost eyesight for a few days after surgery in the 1950's  . Constipation   . Diabetes mellitus without complication (HCC)    no medications   . Hypertension   . Macular degeneration, bilateral    "one eye wet; one eye dry; she gets shots" (07/07/2017)  . PAD (peripheral artery disease) (HCC)   . Pneumonia   . Stroke [facility])    some trouble swallowing   BP (!) 147/81   Pulse 79   SpO2 95%   Opioid Risk Score:   Fall Risk Score:  `1  Depression screen PHQ 2/9  Depression screen PHQ 2/9 08/12/2017  Decreased Interest 1  Down, Depressed, Hopeless 0  PHQ - 2 Score 1  Altered sleeping 0  Tired, decreased energy 1  Change in appetite 1  Feeling bad or failure about yourself  0  Trouble concentrating 0  Moving slowly or fidgety/restless 1  Suicidal thoughts 0  PHQ-9 Score 4  Difficult doing work/chores Not difficult at all    Review of Systems  Constitutional: Negative.        Night sweats  HENT: Negative.   Eyes: Negative.   Respiratory: Negative.   Cardiovascular: Negative.   Gastrointestinal: Positive for constipation.  Endocrine: Negative.   Genitourinary: Negative.   Musculoskeletal: Negative.   Skin: Negative.   Allergic/Immunologic: Negative.   Neurological: Negative.   Hematological: Negative.   Psychiatric/Behavioral: Negative.   All other systems reviewed and are negative.     Objective:   Physical Exam Constitutional: No distress . Vital signs reviewed. HEENT: EOMI, oral membranes moist Cardiovascular: RRR. No JVD    Respiratory: CTA Bilaterally.  Normal effort.    GI: BS +, non-distended  Musculoskeletal: Stump less sensitive.   Neurological.   Alert and oriented  Follows simple commands Motor:  Right lower extremity: Hip flexion, knee extension 4+/5, ankle dorsiflexion 5/5  Left lower extremity: Hip flexion 4+/5  Skin:  Left BKA with eschar along incision  Right heel with eschar Psych: Cooperative.    Assessment  & Plan:  84 year old, right-handed female with history of CVA, maintained on Plavix, hypertension, diet-controlled diabetes mellitus, CKD stage 3 presents for transitional care management after receiving CIR for left BKA.  1. Decreased functional mobility secondary to left BKA secondary to peripheral vascular disease 07/06/2017. Pt s/p arteriogram, transferred off unit post-procedure. Now back to rehab.   Cont therapies  Follow up with Vascular  Cont stump shinker.  2. Pain Management:   Encouraged wean Tramadol, no refill provided  Encouraged desensitization, TENS  Limit Acetaminophen 2000mg /day  Will increase Gabapentin to 300 TID  3. Skin/Wound Care  Follow up with Vascular  4.  Hypertension.    Cont meds  Elevated in office, however WNL  per patient and family at home  5. Urine retention:   Improved  Trial wean Urecholine  6. Gait abnormality  Cont wheelchair for safety  Cont therapies  Meds reviewed Referrals reviewed All questions answered

## 2017-08-12 NOTE — Addendum Note (Signed)
Addended by: Angela NevinWESSLING, Payten Hobin D on: 08/12/2017 04:31 PM   Modules accepted: Orders

## 2017-08-15 DIAGNOSIS — L89610 Pressure ulcer of right heel, unstageable: Secondary | ICD-10-CM | POA: Diagnosis not present

## 2017-08-15 DIAGNOSIS — Z89512 Acquired absence of left leg below knee: Secondary | ICD-10-CM | POA: Diagnosis not present

## 2017-08-15 DIAGNOSIS — M6281 Muscle weakness (generalized): Secondary | ICD-10-CM | POA: Diagnosis not present

## 2017-08-15 DIAGNOSIS — Z4781 Encounter for orthopedic aftercare following surgical amputation: Secondary | ICD-10-CM | POA: Diagnosis not present

## 2017-08-15 DIAGNOSIS — I69398 Other sequelae of cerebral infarction: Secondary | ICD-10-CM | POA: Diagnosis not present

## 2017-08-15 DIAGNOSIS — I129 Hypertensive chronic kidney disease with stage 1 through stage 4 chronic kidney disease, or unspecified chronic kidney disease: Secondary | ICD-10-CM | POA: Diagnosis not present

## 2017-08-16 ENCOUNTER — Other Ambulatory Visit: Payer: Self-pay | Admitting: *Deleted

## 2017-08-16 DIAGNOSIS — I129 Hypertensive chronic kidney disease with stage 1 through stage 4 chronic kidney disease, or unspecified chronic kidney disease: Secondary | ICD-10-CM | POA: Diagnosis not present

## 2017-08-16 DIAGNOSIS — Z89512 Acquired absence of left leg below knee: Secondary | ICD-10-CM | POA: Diagnosis not present

## 2017-08-16 DIAGNOSIS — L89610 Pressure ulcer of right heel, unstageable: Secondary | ICD-10-CM | POA: Diagnosis not present

## 2017-08-16 DIAGNOSIS — I69398 Other sequelae of cerebral infarction: Secondary | ICD-10-CM | POA: Diagnosis not present

## 2017-08-16 DIAGNOSIS — M6281 Muscle weakness (generalized): Secondary | ICD-10-CM | POA: Diagnosis not present

## 2017-08-16 DIAGNOSIS — Z4781 Encounter for orthopedic aftercare following surgical amputation: Secondary | ICD-10-CM | POA: Diagnosis not present

## 2017-08-16 MED ORDER — TRAMADOL HCL 50 MG PO TABS: 50.0000 mg | ORAL_TABLET | Freq: Three times a day (TID) | ORAL | 0 refills | Status: DC | PRN

## 2017-08-17 ENCOUNTER — Telehealth: Payer: Self-pay

## 2017-08-17 DIAGNOSIS — M6281 Muscle weakness (generalized): Secondary | ICD-10-CM | POA: Diagnosis not present

## 2017-08-17 DIAGNOSIS — Z89512 Acquired absence of left leg below knee: Secondary | ICD-10-CM | POA: Diagnosis not present

## 2017-08-17 DIAGNOSIS — I129 Hypertensive chronic kidney disease with stage 1 through stage 4 chronic kidney disease, or unspecified chronic kidney disease: Secondary | ICD-10-CM | POA: Diagnosis not present

## 2017-08-17 DIAGNOSIS — I69398 Other sequelae of cerebral infarction: Secondary | ICD-10-CM | POA: Diagnosis not present

## 2017-08-17 DIAGNOSIS — L89610 Pressure ulcer of right heel, unstageable: Secondary | ICD-10-CM | POA: Diagnosis not present

## 2017-08-17 DIAGNOSIS — Z4781 Encounter for orthopedic aftercare following surgical amputation: Secondary | ICD-10-CM | POA: Diagnosis not present

## 2017-08-17 NOTE — Telephone Encounter (Signed)
Amy called asking for verbal order to hold on pt PT until prothesis comes in for pt because she has met requirements up til now.

## 2017-08-19 DIAGNOSIS — Z89512 Acquired absence of left leg below knee: Secondary | ICD-10-CM | POA: Diagnosis not present

## 2017-08-19 DIAGNOSIS — I129 Hypertensive chronic kidney disease with stage 1 through stage 4 chronic kidney disease, or unspecified chronic kidney disease: Secondary | ICD-10-CM | POA: Diagnosis not present

## 2017-08-19 DIAGNOSIS — Z4781 Encounter for orthopedic aftercare following surgical amputation: Secondary | ICD-10-CM | POA: Diagnosis not present

## 2017-08-19 DIAGNOSIS — I69398 Other sequelae of cerebral infarction: Secondary | ICD-10-CM | POA: Diagnosis not present

## 2017-08-19 DIAGNOSIS — M6281 Muscle weakness (generalized): Secondary | ICD-10-CM | POA: Diagnosis not present

## 2017-08-19 DIAGNOSIS — L89610 Pressure ulcer of right heel, unstageable: Secondary | ICD-10-CM | POA: Diagnosis not present

## 2017-08-22 DIAGNOSIS — L89610 Pressure ulcer of right heel, unstageable: Secondary | ICD-10-CM | POA: Diagnosis not present

## 2017-08-22 DIAGNOSIS — Z4781 Encounter for orthopedic aftercare following surgical amputation: Secondary | ICD-10-CM | POA: Diagnosis not present

## 2017-08-22 DIAGNOSIS — M6281 Muscle weakness (generalized): Secondary | ICD-10-CM | POA: Diagnosis not present

## 2017-08-22 DIAGNOSIS — Z89512 Acquired absence of left leg below knee: Secondary | ICD-10-CM | POA: Diagnosis not present

## 2017-08-22 DIAGNOSIS — I129 Hypertensive chronic kidney disease with stage 1 through stage 4 chronic kidney disease, or unspecified chronic kidney disease: Secondary | ICD-10-CM | POA: Diagnosis not present

## 2017-08-22 DIAGNOSIS — I69398 Other sequelae of cerebral infarction: Secondary | ICD-10-CM | POA: Diagnosis not present

## 2017-08-23 DIAGNOSIS — Z4781 Encounter for orthopedic aftercare following surgical amputation: Secondary | ICD-10-CM | POA: Diagnosis not present

## 2017-08-23 DIAGNOSIS — I69398 Other sequelae of cerebral infarction: Secondary | ICD-10-CM | POA: Diagnosis not present

## 2017-08-23 DIAGNOSIS — L89610 Pressure ulcer of right heel, unstageable: Secondary | ICD-10-CM | POA: Diagnosis not present

## 2017-08-23 DIAGNOSIS — Z89512 Acquired absence of left leg below knee: Secondary | ICD-10-CM | POA: Diagnosis not present

## 2017-08-23 DIAGNOSIS — M6281 Muscle weakness (generalized): Secondary | ICD-10-CM | POA: Diagnosis not present

## 2017-08-23 DIAGNOSIS — I129 Hypertensive chronic kidney disease with stage 1 through stage 4 chronic kidney disease, or unspecified chronic kidney disease: Secondary | ICD-10-CM | POA: Diagnosis not present

## 2017-08-29 DIAGNOSIS — M6281 Muscle weakness (generalized): Secondary | ICD-10-CM | POA: Diagnosis not present

## 2017-08-29 DIAGNOSIS — Z89512 Acquired absence of left leg below knee: Secondary | ICD-10-CM | POA: Diagnosis not present

## 2017-08-29 DIAGNOSIS — L89610 Pressure ulcer of right heel, unstageable: Secondary | ICD-10-CM | POA: Diagnosis not present

## 2017-08-29 DIAGNOSIS — I69398 Other sequelae of cerebral infarction: Secondary | ICD-10-CM | POA: Diagnosis not present

## 2017-08-29 DIAGNOSIS — I129 Hypertensive chronic kidney disease with stage 1 through stage 4 chronic kidney disease, or unspecified chronic kidney disease: Secondary | ICD-10-CM | POA: Diagnosis not present

## 2017-08-29 DIAGNOSIS — Z4781 Encounter for orthopedic aftercare following surgical amputation: Secondary | ICD-10-CM | POA: Diagnosis not present

## 2017-08-30 ENCOUNTER — Ambulatory Visit (INDEPENDENT_AMBULATORY_CARE_PROVIDER_SITE_OTHER): Payer: Self-pay | Admitting: Vascular Surgery

## 2017-08-30 ENCOUNTER — Encounter: Payer: Self-pay | Admitting: Vascular Surgery

## 2017-08-30 ENCOUNTER — Other Ambulatory Visit: Payer: Self-pay

## 2017-08-30 VITALS — BP 156/84 | HR 81 | Temp 97.7°F | Resp 20 | Ht 62.0 in | Wt 157.0 lb

## 2017-08-30 DIAGNOSIS — Z89511 Acquired absence of right leg below knee: Secondary | ICD-10-CM

## 2017-08-30 DIAGNOSIS — Z89512 Acquired absence of left leg below knee: Secondary | ICD-10-CM

## 2017-08-30 NOTE — Progress Notes (Signed)
   Patient name: [name] Petersen MRN: [id number] DOB: 23-Aug-1926 Sex: female  REASON FOR VISIT: Follow-up left below-knee amputation on 07/06/2017  HPI: [name] Petersen is a 84 y.o. female today for follow-up.  She had unreconstructable disease in her left foot with severe rest pain and progressive tissue loss.  She underwent below-knee amputation and then a extended stay in inpatient rehab at [facility].  He is here today with 2 of her daughters.  She looks quite good.  She is returned to her usual spirits and is joking.  She had removal of her staples prior to discharge from inpatient rehab.  She does have separating thin eschar at the incision with no evidence of difficulty healing.  Of concern she had developed a blister and eschar on her right heel.  Arteriography at the time of her admission showed diseased peroneal artery runoff only with no revascularization options.  She fortunately has healed this completely  Current Outpatient Medications  Medication Sig Dispense Refill  . acetaminophen (TYLENOL) 325 MG tablet Take 1-2 tablets (325-650 mg total) by mouth every 6 (six) hours as needed for mild pain.    Marland Kitchen amLODipine (NORVASC) 10 MG tablet Take 1 tablet (10 mg total) by mouth daily. 30 tablet 0  . beta carotene w/minerals (OCUVITE) tablet Take 1 tablet by mouth daily.    . clopidogrel (PLAVIX) 75 MG tablet Take 75 mg by mouth daily.    Marland Kitchen gabapentin (NEURONTIN) 300 MG capsule Take 1 capsule (300 mg total) by mouth 3 (three) times daily. (Patient taking differently: Take 300 mg by mouth 2 (two) times daily. ) 90 capsule 1  . losartan-hydrochlorothiazide (HYZAAR) 50-12.5 MG tablet Take 1 tablet by mouth daily.    Marland Kitchen LOVASTATIN PO Take 5 mg by mouth.    . polyethylene glycol (MIRALAX / GLYCOLAX) packet Take 17 g by mouth daily as needed. 60 each 0  . polyvinyl alcohol (LIQUIFILM TEARS) 1.4 % ophthalmic solution Place 1 drop into both eyes as needed for dry eyes.  15 mL 0  . senna-docusate (SENOKOT-S) 8.6-50 MG tablet Take 2 tablets by mouth at bedtime. 60 tablet 0   No current facility-administered medications for this visit.      PHYSICAL EXAM: Vitals:   08/30/17 0934  BP: (!) 156/84  Pulse: 81  Resp: 20  Temp: 97.7 F (36.5 C)  TempSrc: Oral  SpO2: 98%  Weight: 157 lb (71.2 kg)  Height:  (1.575 m)    GENERAL: The patient is a well-nourished female, in no acute distress. The vital signs are documented above. Left below-knee amputation healed with eschar continued to be separating from her incision line.  Right heel completely healed with no eschar present  MEDICAL ISSUES: Stable overall.  She will continue her physical therapy.  She is to meet with the prosthetists today for continued discussion regarding stump shrinking and eventual prosthetic placement.  She will see [place] again on an as-needed basis.  She knows to keep very close eye on her right foot and will notify should she develop any tissue loss.  I do not feel that she needs to wear her noncontact boot while she is in bed unless she develops a recurrent bedridden state where pressure would be of a concern   Larina Earthly, MD FACS [facility] Tel [phone] Pager [phone]

## 2017-08-31 DIAGNOSIS — Z4781 Encounter for orthopedic aftercare following surgical amputation: Secondary | ICD-10-CM | POA: Diagnosis not present

## 2017-08-31 DIAGNOSIS — Z89512 Acquired absence of left leg below knee: Secondary | ICD-10-CM | POA: Diagnosis not present

## 2017-08-31 DIAGNOSIS — L89610 Pressure ulcer of right heel, unstageable: Secondary | ICD-10-CM | POA: Diagnosis not present

## 2017-08-31 DIAGNOSIS — M6281 Muscle weakness (generalized): Secondary | ICD-10-CM | POA: Diagnosis not present

## 2017-08-31 DIAGNOSIS — I129 Hypertensive chronic kidney disease with stage 1 through stage 4 chronic kidney disease, or unspecified chronic kidney disease: Secondary | ICD-10-CM | POA: Diagnosis not present

## 2017-08-31 DIAGNOSIS — I69398 Other sequelae of cerebral infarction: Secondary | ICD-10-CM | POA: Diagnosis not present

## 2017-09-01 DIAGNOSIS — Z4781 Encounter for orthopedic aftercare following surgical amputation: Secondary | ICD-10-CM | POA: Diagnosis not present

## 2017-09-01 DIAGNOSIS — M6281 Muscle weakness (generalized): Secondary | ICD-10-CM | POA: Diagnosis not present

## 2017-09-01 DIAGNOSIS — I69398 Other sequelae of cerebral infarction: Secondary | ICD-10-CM | POA: Diagnosis not present

## 2017-09-01 DIAGNOSIS — Z89512 Acquired absence of left leg below knee: Secondary | ICD-10-CM | POA: Diagnosis not present

## 2017-09-01 DIAGNOSIS — I129 Hypertensive chronic kidney disease with stage 1 through stage 4 chronic kidney disease, or unspecified chronic kidney disease: Secondary | ICD-10-CM | POA: Diagnosis not present

## 2017-09-01 DIAGNOSIS — L89610 Pressure ulcer of right heel, unstageable: Secondary | ICD-10-CM | POA: Diagnosis not present

## 2017-09-02 DIAGNOSIS — L89610 Pressure ulcer of right heel, unstageable: Secondary | ICD-10-CM | POA: Diagnosis not present

## 2017-09-02 DIAGNOSIS — Z89512 Acquired absence of left leg below knee: Secondary | ICD-10-CM | POA: Diagnosis not present

## 2017-09-02 DIAGNOSIS — M6281 Muscle weakness (generalized): Secondary | ICD-10-CM | POA: Diagnosis not present

## 2017-09-02 DIAGNOSIS — Z4781 Encounter for orthopedic aftercare following surgical amputation: Secondary | ICD-10-CM | POA: Diagnosis not present

## 2017-09-02 DIAGNOSIS — I129 Hypertensive chronic kidney disease with stage 1 through stage 4 chronic kidney disease, or unspecified chronic kidney disease: Secondary | ICD-10-CM | POA: Diagnosis not present

## 2017-09-02 DIAGNOSIS — I69398 Other sequelae of cerebral infarction: Secondary | ICD-10-CM | POA: Diagnosis not present

## 2017-09-06 DIAGNOSIS — Z89512 Acquired absence of left leg below knee: Secondary | ICD-10-CM | POA: Diagnosis not present

## 2017-09-06 DIAGNOSIS — L89610 Pressure ulcer of right heel, unstageable: Secondary | ICD-10-CM | POA: Diagnosis not present

## 2017-09-06 DIAGNOSIS — I129 Hypertensive chronic kidney disease with stage 1 through stage 4 chronic kidney disease, or unspecified chronic kidney disease: Secondary | ICD-10-CM | POA: Diagnosis not present

## 2017-09-06 DIAGNOSIS — M6281 Muscle weakness (generalized): Secondary | ICD-10-CM | POA: Diagnosis not present

## 2017-09-06 DIAGNOSIS — I69398 Other sequelae of cerebral infarction: Secondary | ICD-10-CM | POA: Diagnosis not present

## 2017-09-06 DIAGNOSIS — Z4781 Encounter for orthopedic aftercare following surgical amputation: Secondary | ICD-10-CM | POA: Diagnosis not present

## 2017-09-07 DIAGNOSIS — M6281 Muscle weakness (generalized): Secondary | ICD-10-CM | POA: Diagnosis not present

## 2017-09-07 DIAGNOSIS — I129 Hypertensive chronic kidney disease with stage 1 through stage 4 chronic kidney disease, or unspecified chronic kidney disease: Secondary | ICD-10-CM | POA: Diagnosis not present

## 2017-09-07 DIAGNOSIS — I69398 Other sequelae of cerebral infarction: Secondary | ICD-10-CM | POA: Diagnosis not present

## 2017-09-07 DIAGNOSIS — L89610 Pressure ulcer of right heel, unstageable: Secondary | ICD-10-CM | POA: Diagnosis not present

## 2017-09-07 DIAGNOSIS — Z4781 Encounter for orthopedic aftercare following surgical amputation: Secondary | ICD-10-CM | POA: Diagnosis not present

## 2017-09-07 DIAGNOSIS — Z89512 Acquired absence of left leg below knee: Secondary | ICD-10-CM | POA: Diagnosis not present

## 2017-09-13 DIAGNOSIS — I129 Hypertensive chronic kidney disease with stage 1 through stage 4 chronic kidney disease, or unspecified chronic kidney disease: Secondary | ICD-10-CM | POA: Diagnosis not present

## 2017-09-13 DIAGNOSIS — Z89512 Acquired absence of left leg below knee: Secondary | ICD-10-CM | POA: Diagnosis not present

## 2017-09-13 DIAGNOSIS — M6281 Muscle weakness (generalized): Secondary | ICD-10-CM | POA: Diagnosis not present

## 2017-09-13 DIAGNOSIS — I69398 Other sequelae of cerebral infarction: Secondary | ICD-10-CM | POA: Diagnosis not present

## 2017-09-13 DIAGNOSIS — L89610 Pressure ulcer of right heel, unstageable: Secondary | ICD-10-CM | POA: Diagnosis not present

## 2017-09-13 DIAGNOSIS — Z4781 Encounter for orthopedic aftercare following surgical amputation: Secondary | ICD-10-CM | POA: Diagnosis not present

## 2017-09-15 DIAGNOSIS — Z89512 Acquired absence of left leg below knee: Secondary | ICD-10-CM | POA: Diagnosis not present

## 2017-09-15 DIAGNOSIS — M6281 Muscle weakness (generalized): Secondary | ICD-10-CM | POA: Diagnosis not present

## 2017-09-15 DIAGNOSIS — Z4781 Encounter for orthopedic aftercare following surgical amputation: Secondary | ICD-10-CM | POA: Diagnosis not present

## 2017-09-15 DIAGNOSIS — I129 Hypertensive chronic kidney disease with stage 1 through stage 4 chronic kidney disease, or unspecified chronic kidney disease: Secondary | ICD-10-CM | POA: Diagnosis not present

## 2017-09-15 DIAGNOSIS — L89610 Pressure ulcer of right heel, unstageable: Secondary | ICD-10-CM | POA: Diagnosis not present

## 2017-09-15 DIAGNOSIS — I69398 Other sequelae of cerebral infarction: Secondary | ICD-10-CM | POA: Diagnosis not present

## 2017-09-21 ENCOUNTER — Encounter: Payer: Medicare Other | Admitting: Physical Medicine & Rehabilitation

## 2017-09-23 ENCOUNTER — Telehealth: Payer: Self-pay | Admitting: *Deleted

## 2017-09-23 NOTE — Telephone Encounter (Signed)
[facility] PT notified

## 2017-09-23 NOTE — Telephone Encounter (Signed)
She was to follow up with me to evaluate progress.  There is no scheduled follow up appointment. We can give the verbal order to d/c, but she will need to be seen prior to reordering.  Thanks.

## 2017-09-23 NOTE — Telephone Encounter (Signed)
[facility] PT called and Mrs [name] Petersen has not gotten her prosthetic and is a way away from getting it. They have had her PT on hold for this reason. The end of the certification period is next week and they need verbal ok to d/c.  They can reopen and start care when she receives her prosthetic.

## 2017-09-28 DIAGNOSIS — M6281 Muscle weakness (generalized): Secondary | ICD-10-CM | POA: Diagnosis not present

## 2017-09-28 DIAGNOSIS — I69398 Other sequelae of cerebral infarction: Secondary | ICD-10-CM | POA: Diagnosis not present

## 2017-09-28 DIAGNOSIS — Z89512 Acquired absence of left leg below knee: Secondary | ICD-10-CM | POA: Diagnosis not present

## 2017-09-28 DIAGNOSIS — L89610 Pressure ulcer of right heel, unstageable: Secondary | ICD-10-CM | POA: Diagnosis not present

## 2017-09-28 DIAGNOSIS — Z4781 Encounter for orthopedic aftercare following surgical amputation: Secondary | ICD-10-CM | POA: Diagnosis not present

## 2017-09-28 DIAGNOSIS — I129 Hypertensive chronic kidney disease with stage 1 through stage 4 chronic kidney disease, or unspecified chronic kidney disease: Secondary | ICD-10-CM | POA: Diagnosis not present

## 2017-09-29 DIAGNOSIS — H43813 Vitreous degeneration, bilateral: Secondary | ICD-10-CM | POA: Diagnosis not present

## 2017-09-29 DIAGNOSIS — H353231 Exudative age-related macular degeneration, bilateral, with active choroidal neovascularization: Secondary | ICD-10-CM | POA: Diagnosis not present

## 2017-09-29 DIAGNOSIS — H353211 Exudative age-related macular degeneration, right eye, with active choroidal neovascularization: Secondary | ICD-10-CM | POA: Diagnosis not present

## 2017-10-10 DIAGNOSIS — Z111 Encounter for screening for respiratory tuberculosis: Secondary | ICD-10-CM | POA: Diagnosis not present

## 2017-10-10 DIAGNOSIS — Z89512 Acquired absence of left leg below knee: Secondary | ICD-10-CM | POA: Diagnosis not present

## 2017-10-10 DIAGNOSIS — I639 Cerebral infarction, unspecified: Secondary | ICD-10-CM | POA: Diagnosis not present

## 2017-10-10 DIAGNOSIS — I739 Peripheral vascular disease, unspecified: Secondary | ICD-10-CM | POA: Diagnosis not present

## 2017-10-19 DIAGNOSIS — I739 Peripheral vascular disease, unspecified: Secondary | ICD-10-CM | POA: Diagnosis not present

## 2017-10-19 DIAGNOSIS — Z7902 Long term (current) use of antithrombotics/antiplatelets: Secondary | ICD-10-CM | POA: Diagnosis not present

## 2017-10-19 DIAGNOSIS — Z79891 Long term (current) use of opiate analgesic: Secondary | ICD-10-CM | POA: Diagnosis not present

## 2017-10-19 DIAGNOSIS — G479 Sleep disorder, unspecified: Secondary | ICD-10-CM | POA: Diagnosis not present

## 2017-10-19 DIAGNOSIS — M1990 Unspecified osteoarthritis, unspecified site: Secondary | ICD-10-CM | POA: Diagnosis not present

## 2017-10-19 DIAGNOSIS — E1165 Type 2 diabetes mellitus with hyperglycemia: Secondary | ICD-10-CM | POA: Diagnosis not present

## 2017-10-19 DIAGNOSIS — I10 Essential (primary) hypertension: Secondary | ICD-10-CM | POA: Diagnosis not present

## 2017-10-19 DIAGNOSIS — Z8673 Personal history of transient ischemic attack (TIA), and cerebral infarction without residual deficits: Secondary | ICD-10-CM | POA: Diagnosis not present

## 2017-10-19 DIAGNOSIS — Z89512 Acquired absence of left leg below knee: Secondary | ICD-10-CM | POA: Diagnosis not present

## 2017-10-19 DIAGNOSIS — Z4781 Encounter for orthopedic aftercare following surgical amputation: Secondary | ICD-10-CM | POA: Diagnosis not present

## 2017-10-24 DIAGNOSIS — I739 Peripheral vascular disease, unspecified: Secondary | ICD-10-CM | POA: Diagnosis not present

## 2017-10-24 DIAGNOSIS — Z4781 Encounter for orthopedic aftercare following surgical amputation: Secondary | ICD-10-CM | POA: Diagnosis not present

## 2017-10-24 DIAGNOSIS — Z89512 Acquired absence of left leg below knee: Secondary | ICD-10-CM | POA: Diagnosis not present

## 2017-10-24 DIAGNOSIS — E1165 Type 2 diabetes mellitus with hyperglycemia: Secondary | ICD-10-CM | POA: Diagnosis not present

## 2017-10-24 DIAGNOSIS — I10 Essential (primary) hypertension: Secondary | ICD-10-CM | POA: Diagnosis not present

## 2017-10-24 DIAGNOSIS — M1990 Unspecified osteoarthritis, unspecified site: Secondary | ICD-10-CM | POA: Diagnosis not present

## 2017-10-27 DIAGNOSIS — I739 Peripheral vascular disease, unspecified: Secondary | ICD-10-CM | POA: Diagnosis not present

## 2017-10-27 DIAGNOSIS — E1165 Type 2 diabetes mellitus with hyperglycemia: Secondary | ICD-10-CM | POA: Diagnosis not present

## 2017-10-27 DIAGNOSIS — I10 Essential (primary) hypertension: Secondary | ICD-10-CM | POA: Diagnosis not present

## 2017-10-27 DIAGNOSIS — M1990 Unspecified osteoarthritis, unspecified site: Secondary | ICD-10-CM | POA: Diagnosis not present

## 2017-10-27 DIAGNOSIS — Z89512 Acquired absence of left leg below knee: Secondary | ICD-10-CM | POA: Diagnosis not present

## 2017-10-27 DIAGNOSIS — Z4781 Encounter for orthopedic aftercare following surgical amputation: Secondary | ICD-10-CM | POA: Diagnosis not present

## 2017-10-31 DIAGNOSIS — I10 Essential (primary) hypertension: Secondary | ICD-10-CM | POA: Diagnosis not present

## 2017-10-31 DIAGNOSIS — E1165 Type 2 diabetes mellitus with hyperglycemia: Secondary | ICD-10-CM | POA: Diagnosis not present

## 2017-10-31 DIAGNOSIS — Z4781 Encounter for orthopedic aftercare following surgical amputation: Secondary | ICD-10-CM | POA: Diagnosis not present

## 2017-10-31 DIAGNOSIS — Z89512 Acquired absence of left leg below knee: Secondary | ICD-10-CM | POA: Diagnosis not present

## 2017-10-31 DIAGNOSIS — M1990 Unspecified osteoarthritis, unspecified site: Secondary | ICD-10-CM | POA: Diagnosis not present

## 2017-10-31 DIAGNOSIS — I739 Peripheral vascular disease, unspecified: Secondary | ICD-10-CM | POA: Diagnosis not present

## 2017-11-01 DIAGNOSIS — E1140 Type 2 diabetes mellitus with diabetic neuropathy, unspecified: Secondary | ICD-10-CM | POA: Diagnosis not present

## 2017-11-01 DIAGNOSIS — E1151 Type 2 diabetes mellitus with diabetic peripheral angiopathy without gangrene: Secondary | ICD-10-CM | POA: Diagnosis not present

## 2017-11-03 DIAGNOSIS — Z89512 Acquired absence of left leg below knee: Secondary | ICD-10-CM | POA: Diagnosis not present

## 2017-11-03 DIAGNOSIS — Z4781 Encounter for orthopedic aftercare following surgical amputation: Secondary | ICD-10-CM | POA: Diagnosis not present

## 2017-11-03 DIAGNOSIS — I739 Peripheral vascular disease, unspecified: Secondary | ICD-10-CM | POA: Diagnosis not present

## 2017-11-03 DIAGNOSIS — M1990 Unspecified osteoarthritis, unspecified site: Secondary | ICD-10-CM | POA: Diagnosis not present

## 2017-11-03 DIAGNOSIS — I10 Essential (primary) hypertension: Secondary | ICD-10-CM | POA: Diagnosis not present

## 2017-11-03 DIAGNOSIS — E1165 Type 2 diabetes mellitus with hyperglycemia: Secondary | ICD-10-CM | POA: Diagnosis not present

## 2017-11-07 DIAGNOSIS — Z299 Encounter for prophylactic measures, unspecified: Secondary | ICD-10-CM | POA: Diagnosis not present

## 2017-11-07 DIAGNOSIS — I10 Essential (primary) hypertension: Secondary | ICD-10-CM | POA: Diagnosis not present

## 2017-11-07 DIAGNOSIS — R600 Localized edema: Secondary | ICD-10-CM | POA: Diagnosis not present

## 2017-11-07 DIAGNOSIS — I739 Peripheral vascular disease, unspecified: Secondary | ICD-10-CM | POA: Diagnosis not present

## 2017-11-07 DIAGNOSIS — E1165 Type 2 diabetes mellitus with hyperglycemia: Secondary | ICD-10-CM | POA: Diagnosis not present

## 2017-11-08 DIAGNOSIS — I739 Peripheral vascular disease, unspecified: Secondary | ICD-10-CM | POA: Diagnosis not present

## 2017-11-08 DIAGNOSIS — M1990 Unspecified osteoarthritis, unspecified site: Secondary | ICD-10-CM | POA: Diagnosis not present

## 2017-11-08 DIAGNOSIS — Z89512 Acquired absence of left leg below knee: Secondary | ICD-10-CM | POA: Diagnosis not present

## 2017-11-08 DIAGNOSIS — I10 Essential (primary) hypertension: Secondary | ICD-10-CM | POA: Diagnosis not present

## 2017-11-08 DIAGNOSIS — Z4781 Encounter for orthopedic aftercare following surgical amputation: Secondary | ICD-10-CM | POA: Diagnosis not present

## 2017-11-08 DIAGNOSIS — E1165 Type 2 diabetes mellitus with hyperglycemia: Secondary | ICD-10-CM | POA: Diagnosis not present

## 2017-11-11 DIAGNOSIS — Z89512 Acquired absence of left leg below knee: Secondary | ICD-10-CM | POA: Diagnosis not present

## 2017-11-11 DIAGNOSIS — M1990 Unspecified osteoarthritis, unspecified site: Secondary | ICD-10-CM | POA: Diagnosis not present

## 2017-11-11 DIAGNOSIS — I739 Peripheral vascular disease, unspecified: Secondary | ICD-10-CM | POA: Diagnosis not present

## 2017-11-11 DIAGNOSIS — E1165 Type 2 diabetes mellitus with hyperglycemia: Secondary | ICD-10-CM | POA: Diagnosis not present

## 2017-11-11 DIAGNOSIS — Z4781 Encounter for orthopedic aftercare following surgical amputation: Secondary | ICD-10-CM | POA: Diagnosis not present

## 2017-11-11 DIAGNOSIS — I10 Essential (primary) hypertension: Secondary | ICD-10-CM | POA: Diagnosis not present

## 2017-11-15 DIAGNOSIS — E1165 Type 2 diabetes mellitus with hyperglycemia: Secondary | ICD-10-CM | POA: Diagnosis not present

## 2017-11-15 DIAGNOSIS — I10 Essential (primary) hypertension: Secondary | ICD-10-CM | POA: Diagnosis not present

## 2017-11-15 DIAGNOSIS — I739 Peripheral vascular disease, unspecified: Secondary | ICD-10-CM | POA: Diagnosis not present

## 2017-11-15 DIAGNOSIS — Z4781 Encounter for orthopedic aftercare following surgical amputation: Secondary | ICD-10-CM | POA: Diagnosis not present

## 2017-11-15 DIAGNOSIS — Z89512 Acquired absence of left leg below knee: Secondary | ICD-10-CM | POA: Diagnosis not present

## 2017-11-15 DIAGNOSIS — M1990 Unspecified osteoarthritis, unspecified site: Secondary | ICD-10-CM | POA: Diagnosis not present

## 2017-11-16 DIAGNOSIS — Z89512 Acquired absence of left leg below knee: Secondary | ICD-10-CM | POA: Diagnosis not present

## 2017-11-16 DIAGNOSIS — M1990 Unspecified osteoarthritis, unspecified site: Secondary | ICD-10-CM | POA: Diagnosis not present

## 2017-11-16 DIAGNOSIS — E1165 Type 2 diabetes mellitus with hyperglycemia: Secondary | ICD-10-CM | POA: Diagnosis not present

## 2017-11-16 DIAGNOSIS — I739 Peripheral vascular disease, unspecified: Secondary | ICD-10-CM | POA: Diagnosis not present

## 2017-11-16 DIAGNOSIS — I10 Essential (primary) hypertension: Secondary | ICD-10-CM | POA: Diagnosis not present

## 2017-11-16 DIAGNOSIS — Z4781 Encounter for orthopedic aftercare following surgical amputation: Secondary | ICD-10-CM | POA: Diagnosis not present

## 2017-11-19 DIAGNOSIS — Z4781 Encounter for orthopedic aftercare following surgical amputation: Secondary | ICD-10-CM | POA: Diagnosis not present

## 2017-11-19 DIAGNOSIS — I739 Peripheral vascular disease, unspecified: Secondary | ICD-10-CM | POA: Diagnosis not present

## 2017-11-19 DIAGNOSIS — E1165 Type 2 diabetes mellitus with hyperglycemia: Secondary | ICD-10-CM | POA: Diagnosis not present

## 2017-11-19 DIAGNOSIS — I10 Essential (primary) hypertension: Secondary | ICD-10-CM | POA: Diagnosis not present

## 2017-11-19 DIAGNOSIS — M1990 Unspecified osteoarthritis, unspecified site: Secondary | ICD-10-CM | POA: Diagnosis not present

## 2017-11-19 DIAGNOSIS — Z89512 Acquired absence of left leg below knee: Secondary | ICD-10-CM | POA: Diagnosis not present

## 2017-11-21 DIAGNOSIS — E1165 Type 2 diabetes mellitus with hyperglycemia: Secondary | ICD-10-CM | POA: Diagnosis not present

## 2017-11-21 DIAGNOSIS — I739 Peripheral vascular disease, unspecified: Secondary | ICD-10-CM | POA: Diagnosis not present

## 2017-11-21 DIAGNOSIS — I10 Essential (primary) hypertension: Secondary | ICD-10-CM | POA: Diagnosis not present

## 2017-11-21 DIAGNOSIS — M1990 Unspecified osteoarthritis, unspecified site: Secondary | ICD-10-CM | POA: Diagnosis not present

## 2017-11-21 DIAGNOSIS — Z89512 Acquired absence of left leg below knee: Secondary | ICD-10-CM | POA: Diagnosis not present

## 2017-11-21 DIAGNOSIS — Z4781 Encounter for orthopedic aftercare following surgical amputation: Secondary | ICD-10-CM | POA: Diagnosis not present

## 2017-11-26 DIAGNOSIS — I10 Essential (primary) hypertension: Secondary | ICD-10-CM | POA: Diagnosis not present

## 2017-11-26 DIAGNOSIS — Z89512 Acquired absence of left leg below knee: Secondary | ICD-10-CM | POA: Diagnosis not present

## 2017-11-26 DIAGNOSIS — Z4781 Encounter for orthopedic aftercare following surgical amputation: Secondary | ICD-10-CM | POA: Diagnosis not present

## 2017-11-26 DIAGNOSIS — E1165 Type 2 diabetes mellitus with hyperglycemia: Secondary | ICD-10-CM | POA: Diagnosis not present

## 2017-11-26 DIAGNOSIS — M1990 Unspecified osteoarthritis, unspecified site: Secondary | ICD-10-CM | POA: Diagnosis not present

## 2017-11-26 DIAGNOSIS — I739 Peripheral vascular disease, unspecified: Secondary | ICD-10-CM | POA: Diagnosis not present

## 2017-11-29 DIAGNOSIS — Z4781 Encounter for orthopedic aftercare following surgical amputation: Secondary | ICD-10-CM | POA: Diagnosis not present

## 2017-11-29 DIAGNOSIS — E1165 Type 2 diabetes mellitus with hyperglycemia: Secondary | ICD-10-CM | POA: Diagnosis not present

## 2017-11-29 DIAGNOSIS — I739 Peripheral vascular disease, unspecified: Secondary | ICD-10-CM | POA: Diagnosis not present

## 2017-11-29 DIAGNOSIS — M1990 Unspecified osteoarthritis, unspecified site: Secondary | ICD-10-CM | POA: Diagnosis not present

## 2017-11-29 DIAGNOSIS — Z89512 Acquired absence of left leg below knee: Secondary | ICD-10-CM | POA: Diagnosis not present

## 2017-11-29 DIAGNOSIS — I10 Essential (primary) hypertension: Secondary | ICD-10-CM | POA: Diagnosis not present

## 2017-12-01 DIAGNOSIS — H353211 Exudative age-related macular degeneration, right eye, with active choroidal neovascularization: Secondary | ICD-10-CM | POA: Diagnosis not present

## 2017-12-03 DIAGNOSIS — E1165 Type 2 diabetes mellitus with hyperglycemia: Secondary | ICD-10-CM | POA: Diagnosis not present

## 2017-12-03 DIAGNOSIS — Z4781 Encounter for orthopedic aftercare following surgical amputation: Secondary | ICD-10-CM | POA: Diagnosis not present

## 2017-12-03 DIAGNOSIS — I10 Essential (primary) hypertension: Secondary | ICD-10-CM | POA: Diagnosis not present

## 2017-12-03 DIAGNOSIS — M1990 Unspecified osteoarthritis, unspecified site: Secondary | ICD-10-CM | POA: Diagnosis not present

## 2017-12-03 DIAGNOSIS — Z89512 Acquired absence of left leg below knee: Secondary | ICD-10-CM | POA: Diagnosis not present

## 2017-12-03 DIAGNOSIS — I739 Peripheral vascular disease, unspecified: Secondary | ICD-10-CM | POA: Diagnosis not present

## 2017-12-05 DIAGNOSIS — Z4781 Encounter for orthopedic aftercare following surgical amputation: Secondary | ICD-10-CM | POA: Diagnosis not present

## 2017-12-05 DIAGNOSIS — E1165 Type 2 diabetes mellitus with hyperglycemia: Secondary | ICD-10-CM | POA: Diagnosis not present

## 2017-12-05 DIAGNOSIS — M1990 Unspecified osteoarthritis, unspecified site: Secondary | ICD-10-CM | POA: Diagnosis not present

## 2017-12-05 DIAGNOSIS — I10 Essential (primary) hypertension: Secondary | ICD-10-CM | POA: Diagnosis not present

## 2017-12-05 DIAGNOSIS — Z89512 Acquired absence of left leg below knee: Secondary | ICD-10-CM | POA: Diagnosis not present

## 2017-12-05 DIAGNOSIS — I739 Peripheral vascular disease, unspecified: Secondary | ICD-10-CM | POA: Diagnosis not present

## 2017-12-08 DIAGNOSIS — N390 Urinary tract infection, site not specified: Secondary | ICD-10-CM | POA: Diagnosis not present

## 2017-12-08 DIAGNOSIS — M79671 Pain in right foot: Secondary | ICD-10-CM | POA: Diagnosis not present

## 2017-12-08 DIAGNOSIS — I10 Essential (primary) hypertension: Secondary | ICD-10-CM | POA: Diagnosis not present

## 2017-12-08 DIAGNOSIS — Z6828 Body mass index (BMI) 28.0-28.9, adult: Secondary | ICD-10-CM | POA: Diagnosis not present

## 2017-12-08 DIAGNOSIS — Z4781 Encounter for orthopedic aftercare following surgical amputation: Secondary | ICD-10-CM | POA: Diagnosis not present

## 2017-12-08 DIAGNOSIS — E1165 Type 2 diabetes mellitus with hyperglycemia: Secondary | ICD-10-CM | POA: Diagnosis not present

## 2017-12-08 DIAGNOSIS — M1990 Unspecified osteoarthritis, unspecified site: Secondary | ICD-10-CM | POA: Diagnosis not present

## 2017-12-08 DIAGNOSIS — Z299 Encounter for prophylactic measures, unspecified: Secondary | ICD-10-CM | POA: Diagnosis not present

## 2017-12-08 DIAGNOSIS — R350 Frequency of micturition: Secondary | ICD-10-CM | POA: Diagnosis not present

## 2017-12-08 DIAGNOSIS — Z89512 Acquired absence of left leg below knee: Secondary | ICD-10-CM | POA: Diagnosis not present

## 2017-12-08 DIAGNOSIS — I739 Peripheral vascular disease, unspecified: Secondary | ICD-10-CM | POA: Diagnosis not present

## 2017-12-09 DIAGNOSIS — M79671 Pain in right foot: Secondary | ICD-10-CM | POA: Diagnosis not present

## 2017-12-09 DIAGNOSIS — M79641 Pain in right hand: Secondary | ICD-10-CM | POA: Diagnosis not present

## 2017-12-13 DIAGNOSIS — Z4781 Encounter for orthopedic aftercare following surgical amputation: Secondary | ICD-10-CM | POA: Diagnosis not present

## 2017-12-13 DIAGNOSIS — I739 Peripheral vascular disease, unspecified: Secondary | ICD-10-CM | POA: Diagnosis not present

## 2017-12-13 DIAGNOSIS — I10 Essential (primary) hypertension: Secondary | ICD-10-CM | POA: Diagnosis not present

## 2017-12-13 DIAGNOSIS — E1165 Type 2 diabetes mellitus with hyperglycemia: Secondary | ICD-10-CM | POA: Diagnosis not present

## 2017-12-13 DIAGNOSIS — M1990 Unspecified osteoarthritis, unspecified site: Secondary | ICD-10-CM | POA: Diagnosis not present

## 2017-12-13 DIAGNOSIS — Z89512 Acquired absence of left leg below knee: Secondary | ICD-10-CM | POA: Diagnosis not present

## 2017-12-15 DIAGNOSIS — E1165 Type 2 diabetes mellitus with hyperglycemia: Secondary | ICD-10-CM | POA: Diagnosis not present

## 2017-12-15 DIAGNOSIS — M1990 Unspecified osteoarthritis, unspecified site: Secondary | ICD-10-CM | POA: Diagnosis not present

## 2017-12-15 DIAGNOSIS — I10 Essential (primary) hypertension: Secondary | ICD-10-CM | POA: Diagnosis not present

## 2017-12-15 DIAGNOSIS — Z89512 Acquired absence of left leg below knee: Secondary | ICD-10-CM | POA: Diagnosis not present

## 2017-12-15 DIAGNOSIS — I739 Peripheral vascular disease, unspecified: Secondary | ICD-10-CM | POA: Diagnosis not present

## 2017-12-15 DIAGNOSIS — Z4781 Encounter for orthopedic aftercare following surgical amputation: Secondary | ICD-10-CM | POA: Diagnosis not present

## 2017-12-18 DIAGNOSIS — I10 Essential (primary) hypertension: Secondary | ICD-10-CM | POA: Diagnosis not present

## 2017-12-18 DIAGNOSIS — J45909 Unspecified asthma, uncomplicated: Secondary | ICD-10-CM | POA: Diagnosis not present

## 2017-12-18 DIAGNOSIS — Z7902 Long term (current) use of antithrombotics/antiplatelets: Secondary | ICD-10-CM | POA: Diagnosis not present

## 2017-12-18 DIAGNOSIS — Z79891 Long term (current) use of opiate analgesic: Secondary | ICD-10-CM | POA: Diagnosis not present

## 2017-12-18 DIAGNOSIS — E1165 Type 2 diabetes mellitus with hyperglycemia: Secondary | ICD-10-CM | POA: Diagnosis not present

## 2017-12-18 DIAGNOSIS — Z44122 Encounter for fitting and adjustment of partial artificial left leg: Secondary | ICD-10-CM | POA: Diagnosis not present

## 2017-12-18 DIAGNOSIS — Z89512 Acquired absence of left leg below knee: Secondary | ICD-10-CM | POA: Diagnosis not present

## 2017-12-18 DIAGNOSIS — I739 Peripheral vascular disease, unspecified: Secondary | ICD-10-CM | POA: Diagnosis not present

## 2017-12-18 DIAGNOSIS — M1990 Unspecified osteoarthritis, unspecified site: Secondary | ICD-10-CM | POA: Diagnosis not present

## 2017-12-18 DIAGNOSIS — E1151 Type 2 diabetes mellitus with diabetic peripheral angiopathy without gangrene: Secondary | ICD-10-CM | POA: Diagnosis not present

## 2017-12-18 DIAGNOSIS — Z8673 Personal history of transient ischemic attack (TIA), and cerebral infarction without residual deficits: Secondary | ICD-10-CM | POA: Diagnosis not present

## 2017-12-20 DIAGNOSIS — E1151 Type 2 diabetes mellitus with diabetic peripheral angiopathy without gangrene: Secondary | ICD-10-CM | POA: Diagnosis not present

## 2017-12-20 DIAGNOSIS — Z89512 Acquired absence of left leg below knee: Secondary | ICD-10-CM | POA: Diagnosis not present

## 2017-12-20 DIAGNOSIS — E1165 Type 2 diabetes mellitus with hyperglycemia: Secondary | ICD-10-CM | POA: Diagnosis not present

## 2017-12-20 DIAGNOSIS — Z44122 Encounter for fitting and adjustment of partial artificial left leg: Secondary | ICD-10-CM | POA: Diagnosis not present

## 2017-12-20 DIAGNOSIS — I10 Essential (primary) hypertension: Secondary | ICD-10-CM | POA: Diagnosis not present

## 2017-12-20 DIAGNOSIS — I739 Peripheral vascular disease, unspecified: Secondary | ICD-10-CM | POA: Diagnosis not present

## 2017-12-22 DIAGNOSIS — E1151 Type 2 diabetes mellitus with diabetic peripheral angiopathy without gangrene: Secondary | ICD-10-CM | POA: Diagnosis not present

## 2017-12-22 DIAGNOSIS — E1165 Type 2 diabetes mellitus with hyperglycemia: Secondary | ICD-10-CM | POA: Diagnosis not present

## 2017-12-22 DIAGNOSIS — I10 Essential (primary) hypertension: Secondary | ICD-10-CM | POA: Diagnosis not present

## 2017-12-22 DIAGNOSIS — I739 Peripheral vascular disease, unspecified: Secondary | ICD-10-CM | POA: Diagnosis not present

## 2017-12-22 DIAGNOSIS — Z89512 Acquired absence of left leg below knee: Secondary | ICD-10-CM | POA: Diagnosis not present

## 2017-12-22 DIAGNOSIS — Z44122 Encounter for fitting and adjustment of partial artificial left leg: Secondary | ICD-10-CM | POA: Diagnosis not present

## 2017-12-27 DIAGNOSIS — Z89512 Acquired absence of left leg below knee: Secondary | ICD-10-CM | POA: Diagnosis not present

## 2017-12-27 DIAGNOSIS — E1151 Type 2 diabetes mellitus with diabetic peripheral angiopathy without gangrene: Secondary | ICD-10-CM | POA: Diagnosis not present

## 2017-12-27 DIAGNOSIS — E1165 Type 2 diabetes mellitus with hyperglycemia: Secondary | ICD-10-CM | POA: Diagnosis not present

## 2017-12-27 DIAGNOSIS — Z44122 Encounter for fitting and adjustment of partial artificial left leg: Secondary | ICD-10-CM | POA: Diagnosis not present

## 2017-12-27 DIAGNOSIS — I739 Peripheral vascular disease, unspecified: Secondary | ICD-10-CM | POA: Diagnosis not present

## 2017-12-27 DIAGNOSIS — I10 Essential (primary) hypertension: Secondary | ICD-10-CM | POA: Diagnosis not present

## 2017-12-30 DIAGNOSIS — Z89512 Acquired absence of left leg below knee: Secondary | ICD-10-CM | POA: Diagnosis not present

## 2017-12-30 DIAGNOSIS — E1165 Type 2 diabetes mellitus with hyperglycemia: Secondary | ICD-10-CM | POA: Diagnosis not present

## 2017-12-30 DIAGNOSIS — Z44122 Encounter for fitting and adjustment of partial artificial left leg: Secondary | ICD-10-CM | POA: Diagnosis not present

## 2017-12-30 DIAGNOSIS — I739 Peripheral vascular disease, unspecified: Secondary | ICD-10-CM | POA: Diagnosis not present

## 2017-12-30 DIAGNOSIS — I10 Essential (primary) hypertension: Secondary | ICD-10-CM | POA: Diagnosis not present

## 2017-12-30 DIAGNOSIS — E1151 Type 2 diabetes mellitus with diabetic peripheral angiopathy without gangrene: Secondary | ICD-10-CM | POA: Diagnosis not present

## 2018-01-03 DIAGNOSIS — Z44122 Encounter for fitting and adjustment of partial artificial left leg: Secondary | ICD-10-CM | POA: Diagnosis not present

## 2018-01-03 DIAGNOSIS — Z89512 Acquired absence of left leg below knee: Secondary | ICD-10-CM | POA: Diagnosis not present

## 2018-01-03 DIAGNOSIS — E1165 Type 2 diabetes mellitus with hyperglycemia: Secondary | ICD-10-CM | POA: Diagnosis not present

## 2018-01-03 DIAGNOSIS — E1151 Type 2 diabetes mellitus with diabetic peripheral angiopathy without gangrene: Secondary | ICD-10-CM | POA: Diagnosis not present

## 2018-01-03 DIAGNOSIS — I739 Peripheral vascular disease, unspecified: Secondary | ICD-10-CM | POA: Diagnosis not present

## 2018-01-03 DIAGNOSIS — I10 Essential (primary) hypertension: Secondary | ICD-10-CM | POA: Diagnosis not present

## 2018-01-04 DIAGNOSIS — Z44122 Encounter for fitting and adjustment of partial artificial left leg: Secondary | ICD-10-CM | POA: Diagnosis not present

## 2018-01-04 DIAGNOSIS — E1151 Type 2 diabetes mellitus with diabetic peripheral angiopathy without gangrene: Secondary | ICD-10-CM | POA: Diagnosis not present

## 2018-01-04 DIAGNOSIS — I739 Peripheral vascular disease, unspecified: Secondary | ICD-10-CM | POA: Diagnosis not present

## 2018-01-04 DIAGNOSIS — Z89512 Acquired absence of left leg below knee: Secondary | ICD-10-CM | POA: Diagnosis not present

## 2018-01-04 DIAGNOSIS — I10 Essential (primary) hypertension: Secondary | ICD-10-CM | POA: Diagnosis not present

## 2018-01-04 DIAGNOSIS — E1165 Type 2 diabetes mellitus with hyperglycemia: Secondary | ICD-10-CM | POA: Diagnosis not present

## 2018-01-06 DIAGNOSIS — Z89512 Acquired absence of left leg below knee: Secondary | ICD-10-CM | POA: Diagnosis not present

## 2018-01-06 DIAGNOSIS — Z44122 Encounter for fitting and adjustment of partial artificial left leg: Secondary | ICD-10-CM | POA: Diagnosis not present

## 2018-01-06 DIAGNOSIS — I739 Peripheral vascular disease, unspecified: Secondary | ICD-10-CM | POA: Diagnosis not present

## 2018-01-06 DIAGNOSIS — I10 Essential (primary) hypertension: Secondary | ICD-10-CM | POA: Diagnosis not present

## 2018-01-06 DIAGNOSIS — E1151 Type 2 diabetes mellitus with diabetic peripheral angiopathy without gangrene: Secondary | ICD-10-CM | POA: Diagnosis not present

## 2018-01-06 DIAGNOSIS — E1165 Type 2 diabetes mellitus with hyperglycemia: Secondary | ICD-10-CM | POA: Diagnosis not present

## 2018-01-07 DIAGNOSIS — Z23 Encounter for immunization: Secondary | ICD-10-CM | POA: Diagnosis not present

## 2018-01-09 DIAGNOSIS — Z44122 Encounter for fitting and adjustment of partial artificial left leg: Secondary | ICD-10-CM | POA: Diagnosis not present

## 2018-01-09 DIAGNOSIS — E1151 Type 2 diabetes mellitus with diabetic peripheral angiopathy without gangrene: Secondary | ICD-10-CM | POA: Diagnosis not present

## 2018-01-09 DIAGNOSIS — I739 Peripheral vascular disease, unspecified: Secondary | ICD-10-CM | POA: Diagnosis not present

## 2018-01-09 DIAGNOSIS — Z89512 Acquired absence of left leg below knee: Secondary | ICD-10-CM | POA: Diagnosis not present

## 2018-01-09 DIAGNOSIS — E1165 Type 2 diabetes mellitus with hyperglycemia: Secondary | ICD-10-CM | POA: Diagnosis not present

## 2018-01-09 DIAGNOSIS — I10 Essential (primary) hypertension: Secondary | ICD-10-CM | POA: Diagnosis not present

## 2018-01-10 DIAGNOSIS — Z89512 Acquired absence of left leg below knee: Secondary | ICD-10-CM | POA: Diagnosis not present

## 2018-01-10 DIAGNOSIS — E1151 Type 2 diabetes mellitus with diabetic peripheral angiopathy without gangrene: Secondary | ICD-10-CM | POA: Diagnosis not present

## 2018-01-10 DIAGNOSIS — I10 Essential (primary) hypertension: Secondary | ICD-10-CM | POA: Diagnosis not present

## 2018-01-10 DIAGNOSIS — E1165 Type 2 diabetes mellitus with hyperglycemia: Secondary | ICD-10-CM | POA: Diagnosis not present

## 2018-01-10 DIAGNOSIS — I739 Peripheral vascular disease, unspecified: Secondary | ICD-10-CM | POA: Diagnosis not present

## 2018-01-10 DIAGNOSIS — Z44122 Encounter for fitting and adjustment of partial artificial left leg: Secondary | ICD-10-CM | POA: Diagnosis not present

## 2018-01-11 DIAGNOSIS — Z23 Encounter for immunization: Secondary | ICD-10-CM | POA: Diagnosis not present

## 2018-01-13 DIAGNOSIS — E1165 Type 2 diabetes mellitus with hyperglycemia: Secondary | ICD-10-CM | POA: Diagnosis not present

## 2018-01-13 DIAGNOSIS — Z44122 Encounter for fitting and adjustment of partial artificial left leg: Secondary | ICD-10-CM | POA: Diagnosis not present

## 2018-01-13 DIAGNOSIS — E1151 Type 2 diabetes mellitus with diabetic peripheral angiopathy without gangrene: Secondary | ICD-10-CM | POA: Diagnosis not present

## 2018-01-13 DIAGNOSIS — I739 Peripheral vascular disease, unspecified: Secondary | ICD-10-CM | POA: Diagnosis not present

## 2018-01-13 DIAGNOSIS — I10 Essential (primary) hypertension: Secondary | ICD-10-CM | POA: Diagnosis not present

## 2018-01-13 DIAGNOSIS — Z89512 Acquired absence of left leg below knee: Secondary | ICD-10-CM | POA: Diagnosis not present

## 2018-01-16 DIAGNOSIS — Z44122 Encounter for fitting and adjustment of partial artificial left leg: Secondary | ICD-10-CM | POA: Diagnosis not present

## 2018-01-16 DIAGNOSIS — Z89512 Acquired absence of left leg below knee: Secondary | ICD-10-CM | POA: Diagnosis not present

## 2018-01-16 DIAGNOSIS — I739 Peripheral vascular disease, unspecified: Secondary | ICD-10-CM | POA: Diagnosis not present

## 2018-01-16 DIAGNOSIS — E1151 Type 2 diabetes mellitus with diabetic peripheral angiopathy without gangrene: Secondary | ICD-10-CM | POA: Diagnosis not present

## 2018-01-16 DIAGNOSIS — E1165 Type 2 diabetes mellitus with hyperglycemia: Secondary | ICD-10-CM | POA: Diagnosis not present

## 2018-01-16 DIAGNOSIS — I10 Essential (primary) hypertension: Secondary | ICD-10-CM | POA: Diagnosis not present

## 2018-01-17 DIAGNOSIS — Z44122 Encounter for fitting and adjustment of partial artificial left leg: Secondary | ICD-10-CM | POA: Diagnosis not present

## 2018-01-17 DIAGNOSIS — E1151 Type 2 diabetes mellitus with diabetic peripheral angiopathy without gangrene: Secondary | ICD-10-CM | POA: Diagnosis not present

## 2018-01-17 DIAGNOSIS — Z89512 Acquired absence of left leg below knee: Secondary | ICD-10-CM | POA: Diagnosis not present

## 2018-01-17 DIAGNOSIS — I739 Peripheral vascular disease, unspecified: Secondary | ICD-10-CM | POA: Diagnosis not present

## 2018-01-17 DIAGNOSIS — E1165 Type 2 diabetes mellitus with hyperglycemia: Secondary | ICD-10-CM | POA: Diagnosis not present

## 2018-01-17 DIAGNOSIS — I10 Essential (primary) hypertension: Secondary | ICD-10-CM | POA: Diagnosis not present

## 2018-01-20 DIAGNOSIS — I10 Essential (primary) hypertension: Secondary | ICD-10-CM | POA: Diagnosis not present

## 2018-01-20 DIAGNOSIS — E1165 Type 2 diabetes mellitus with hyperglycemia: Secondary | ICD-10-CM | POA: Diagnosis not present

## 2018-01-20 DIAGNOSIS — I739 Peripheral vascular disease, unspecified: Secondary | ICD-10-CM | POA: Diagnosis not present

## 2018-01-20 DIAGNOSIS — Z89512 Acquired absence of left leg below knee: Secondary | ICD-10-CM | POA: Diagnosis not present

## 2018-01-20 DIAGNOSIS — E1151 Type 2 diabetes mellitus with diabetic peripheral angiopathy without gangrene: Secondary | ICD-10-CM | POA: Diagnosis not present

## 2018-01-20 DIAGNOSIS — Z44122 Encounter for fitting and adjustment of partial artificial left leg: Secondary | ICD-10-CM | POA: Diagnosis not present

## 2018-01-23 DIAGNOSIS — Z44122 Encounter for fitting and adjustment of partial artificial left leg: Secondary | ICD-10-CM | POA: Diagnosis not present

## 2018-01-23 DIAGNOSIS — E1151 Type 2 diabetes mellitus with diabetic peripheral angiopathy without gangrene: Secondary | ICD-10-CM | POA: Diagnosis not present

## 2018-01-23 DIAGNOSIS — Z89512 Acquired absence of left leg below knee: Secondary | ICD-10-CM | POA: Diagnosis not present

## 2018-01-23 DIAGNOSIS — E1165 Type 2 diabetes mellitus with hyperglycemia: Secondary | ICD-10-CM | POA: Diagnosis not present

## 2018-01-23 DIAGNOSIS — I739 Peripheral vascular disease, unspecified: Secondary | ICD-10-CM | POA: Diagnosis not present

## 2018-01-23 DIAGNOSIS — I10 Essential (primary) hypertension: Secondary | ICD-10-CM | POA: Diagnosis not present

## 2018-01-24 DIAGNOSIS — E1165 Type 2 diabetes mellitus with hyperglycemia: Secondary | ICD-10-CM | POA: Diagnosis not present

## 2018-01-24 DIAGNOSIS — Z44122 Encounter for fitting and adjustment of partial artificial left leg: Secondary | ICD-10-CM | POA: Diagnosis not present

## 2018-01-24 DIAGNOSIS — Z89512 Acquired absence of left leg below knee: Secondary | ICD-10-CM | POA: Diagnosis not present

## 2018-01-24 DIAGNOSIS — E1151 Type 2 diabetes mellitus with diabetic peripheral angiopathy without gangrene: Secondary | ICD-10-CM | POA: Diagnosis not present

## 2018-01-24 DIAGNOSIS — I739 Peripheral vascular disease, unspecified: Secondary | ICD-10-CM | POA: Diagnosis not present

## 2018-01-24 DIAGNOSIS — I10 Essential (primary) hypertension: Secondary | ICD-10-CM | POA: Diagnosis not present

## 2018-01-26 DIAGNOSIS — I739 Peripheral vascular disease, unspecified: Secondary | ICD-10-CM | POA: Diagnosis not present

## 2018-01-26 DIAGNOSIS — Z89512 Acquired absence of left leg below knee: Secondary | ICD-10-CM | POA: Diagnosis not present

## 2018-01-26 DIAGNOSIS — Z44122 Encounter for fitting and adjustment of partial artificial left leg: Secondary | ICD-10-CM | POA: Diagnosis not present

## 2018-01-26 DIAGNOSIS — E1165 Type 2 diabetes mellitus with hyperglycemia: Secondary | ICD-10-CM | POA: Diagnosis not present

## 2018-01-26 DIAGNOSIS — I10 Essential (primary) hypertension: Secondary | ICD-10-CM | POA: Diagnosis not present

## 2018-01-26 DIAGNOSIS — H353211 Exudative age-related macular degeneration, right eye, with active choroidal neovascularization: Secondary | ICD-10-CM | POA: Diagnosis not present

## 2018-01-26 DIAGNOSIS — E1151 Type 2 diabetes mellitus with diabetic peripheral angiopathy without gangrene: Secondary | ICD-10-CM | POA: Diagnosis not present

## 2018-01-26 DIAGNOSIS — H353231 Exudative age-related macular degeneration, bilateral, with active choroidal neovascularization: Secondary | ICD-10-CM | POA: Diagnosis not present

## 2018-01-27 DIAGNOSIS — Z89512 Acquired absence of left leg below knee: Secondary | ICD-10-CM | POA: Diagnosis not present

## 2018-01-27 DIAGNOSIS — Z44122 Encounter for fitting and adjustment of partial artificial left leg: Secondary | ICD-10-CM | POA: Diagnosis not present

## 2018-01-27 DIAGNOSIS — I10 Essential (primary) hypertension: Secondary | ICD-10-CM | POA: Diagnosis not present

## 2018-01-27 DIAGNOSIS — I739 Peripheral vascular disease, unspecified: Secondary | ICD-10-CM | POA: Diagnosis not present

## 2018-01-27 DIAGNOSIS — E1151 Type 2 diabetes mellitus with diabetic peripheral angiopathy without gangrene: Secondary | ICD-10-CM | POA: Diagnosis not present

## 2018-01-27 DIAGNOSIS — E1165 Type 2 diabetes mellitus with hyperglycemia: Secondary | ICD-10-CM | POA: Diagnosis not present

## 2018-01-30 DIAGNOSIS — E1165 Type 2 diabetes mellitus with hyperglycemia: Secondary | ICD-10-CM | POA: Diagnosis not present

## 2018-01-30 DIAGNOSIS — Z89512 Acquired absence of left leg below knee: Secondary | ICD-10-CM | POA: Diagnosis not present

## 2018-01-30 DIAGNOSIS — I10 Essential (primary) hypertension: Secondary | ICD-10-CM | POA: Diagnosis not present

## 2018-01-30 DIAGNOSIS — E1151 Type 2 diabetes mellitus with diabetic peripheral angiopathy without gangrene: Secondary | ICD-10-CM | POA: Diagnosis not present

## 2018-01-30 DIAGNOSIS — I739 Peripheral vascular disease, unspecified: Secondary | ICD-10-CM | POA: Diagnosis not present

## 2018-01-30 DIAGNOSIS — Z44122 Encounter for fitting and adjustment of partial artificial left leg: Secondary | ICD-10-CM | POA: Diagnosis not present

## 2018-01-31 DIAGNOSIS — I739 Peripheral vascular disease, unspecified: Secondary | ICD-10-CM | POA: Diagnosis not present

## 2018-01-31 DIAGNOSIS — Z44122 Encounter for fitting and adjustment of partial artificial left leg: Secondary | ICD-10-CM | POA: Diagnosis not present

## 2018-01-31 DIAGNOSIS — E1151 Type 2 diabetes mellitus with diabetic peripheral angiopathy without gangrene: Secondary | ICD-10-CM | POA: Diagnosis not present

## 2018-01-31 DIAGNOSIS — Z89512 Acquired absence of left leg below knee: Secondary | ICD-10-CM | POA: Diagnosis not present

## 2018-01-31 DIAGNOSIS — I10 Essential (primary) hypertension: Secondary | ICD-10-CM | POA: Diagnosis not present

## 2018-01-31 DIAGNOSIS — E1165 Type 2 diabetes mellitus with hyperglycemia: Secondary | ICD-10-CM | POA: Diagnosis not present

## 2018-02-09 DIAGNOSIS — I10 Essential (primary) hypertension: Secondary | ICD-10-CM | POA: Diagnosis not present

## 2018-02-09 DIAGNOSIS — Z89512 Acquired absence of left leg below knee: Secondary | ICD-10-CM | POA: Diagnosis not present

## 2018-02-09 DIAGNOSIS — E1165 Type 2 diabetes mellitus with hyperglycemia: Secondary | ICD-10-CM | POA: Diagnosis not present

## 2018-02-09 DIAGNOSIS — Z44122 Encounter for fitting and adjustment of partial artificial left leg: Secondary | ICD-10-CM | POA: Diagnosis not present

## 2018-02-09 DIAGNOSIS — I739 Peripheral vascular disease, unspecified: Secondary | ICD-10-CM | POA: Diagnosis not present

## 2018-02-09 DIAGNOSIS — E1151 Type 2 diabetes mellitus with diabetic peripheral angiopathy without gangrene: Secondary | ICD-10-CM | POA: Diagnosis not present

## 2018-02-21 DIAGNOSIS — Z0000 Encounter for general adult medical examination without abnormal findings: Secondary | ICD-10-CM | POA: Diagnosis not present

## 2018-02-21 DIAGNOSIS — Z1211 Encounter for screening for malignant neoplasm of colon: Secondary | ICD-10-CM | POA: Diagnosis not present

## 2018-02-21 DIAGNOSIS — Z1331 Encounter for screening for depression: Secondary | ICD-10-CM | POA: Diagnosis not present

## 2018-02-21 DIAGNOSIS — Z7189 Other specified counseling: Secondary | ICD-10-CM | POA: Diagnosis not present

## 2018-02-21 DIAGNOSIS — E1165 Type 2 diabetes mellitus with hyperglycemia: Secondary | ICD-10-CM | POA: Diagnosis not present

## 2018-02-21 DIAGNOSIS — E7800 Pure hypercholesterolemia, unspecified: Secondary | ICD-10-CM | POA: Diagnosis not present

## 2018-02-21 DIAGNOSIS — R5383 Other fatigue: Secondary | ICD-10-CM | POA: Diagnosis not present

## 2018-02-21 DIAGNOSIS — I10 Essential (primary) hypertension: Secondary | ICD-10-CM | POA: Diagnosis not present

## 2018-02-21 DIAGNOSIS — Z299 Encounter for prophylactic measures, unspecified: Secondary | ICD-10-CM | POA: Diagnosis not present

## 2018-02-21 DIAGNOSIS — Z6828 Body mass index (BMI) 28.0-28.9, adult: Secondary | ICD-10-CM | POA: Diagnosis not present

## 2018-02-21 DIAGNOSIS — Z79899 Other long term (current) drug therapy: Secondary | ICD-10-CM | POA: Diagnosis not present

## 2018-02-21 DIAGNOSIS — Z1339 Encounter for screening examination for other mental health and behavioral disorders: Secondary | ICD-10-CM | POA: Diagnosis not present

## 2018-03-23 DIAGNOSIS — H353231 Exudative age-related macular degeneration, bilateral, with active choroidal neovascularization: Secondary | ICD-10-CM | POA: Diagnosis not present

## 2018-04-27 DIAGNOSIS — M79674 Pain in right toe(s): Secondary | ICD-10-CM | POA: Diagnosis not present

## 2018-04-27 DIAGNOSIS — B351 Tinea unguium: Secondary | ICD-10-CM | POA: Diagnosis not present

## 2018-05-09 ENCOUNTER — Encounter: Payer: Self-pay | Admitting: Vascular Surgery

## 2018-05-09 ENCOUNTER — Other Ambulatory Visit: Payer: Self-pay

## 2018-05-09 ENCOUNTER — Ambulatory Visit (INDEPENDENT_AMBULATORY_CARE_PROVIDER_SITE_OTHER): Payer: Medicare Other | Admitting: Vascular Surgery

## 2018-05-09 VITALS — BP 162/71 | HR 73 | Temp 98.2°F | Resp 18 | Ht 62.0 in

## 2018-05-09 DIAGNOSIS — I739 Peripheral vascular disease, unspecified: Secondary | ICD-10-CM | POA: Diagnosis not present

## 2018-05-09 NOTE — Progress Notes (Signed)
[facility] [place]  Patient name: [name] Petersen MRN: [id number] DOB: 07-07-1926 Sex: female  REASON FOR VISIT: Evaluation of coolness left below-knee amputation stump and follow-up arterial insufficiency right lower extremity  HPI: [name] Petersen is a 85 y.o. female here today for follow-up.  She is here today with HER-2 daughters.  She is well-known to me from presentation and February 2019 with gangrenous changes of her left great toe.  Work-up at that time revealed occlusion of her above-knee popliteal artery and complete occlusion of all tibial runoff.  She had nonhealing and eventually underwent below-knee amputation.  Was able to heal her below-knee amputation.  She then developed ulceration over her right heel.  Arteriography revealed patency of her superficial femoral and popliteal artery.  She had single-vessel runoff and eventually healed this with protection.  She presents now with a sensation of coolness and pain in her below-knee amputation stump.  She has had no difficulty with ulceration and has had no further tissue loss in her right foot.  He did progress to standing some with her left below-knee prosthesis but is done very little walking.  She reports that this does feel somewhat worse with walking in her prosthesis  Past Medical History:  Diagnosis Date  . Arthritis    hands  . Atherosclerotic PVD with ulceration ([place]) 07/15/2017   RIGHT HEEL  . Complication of anesthesia    lost eyesight for a few days after surgery in the 1950's  . Constipation   . Diabetes mellitus without complication (HCC)    no medications   . Hypertension   . Macular degeneration, bilateral    "one eye wet; one eye dry; she gets shots" (07/07/2017)  . PAD (peripheral artery disease) ([place])   . Pneumonia   . Stroke [facility])    some trouble swallowing    Family History  Adopted: Yes    SOCIAL HISTORY: Social History   Tobacco Use  .  Smoking status: Never Smoker  . Smokeless tobacco: Never Used  Substance Use Topics  . Alcohol use: Never    Frequency: Never    Allergies  Allergen Reactions  . Scopace [Scopolamine] Other (See Comments)    Hallucinations    Current Outpatient Medications  Medication Sig Dispense Refill  . acetaminophen (TYLENOL) 325 MG tablet Take 1-2 tablets (325-650 mg total) by mouth every 6 (six) hours as needed for mild pain.    Marland Kitchen amLODipine (NORVASC) 10 MG tablet Take 1 tablet (10 mg total) by mouth daily. 30 tablet 0  . beta carotene w/minerals (OCUVITE) tablet Take 1 tablet by mouth daily.    . clopidogrel (PLAVIX) 75 MG tablet Take 75 mg by mouth daily.    Marland Kitchen losartan-hydrochlorothiazide (HYZAAR) 50-12.5 MG tablet Take 1 tablet by mouth daily.    Marland Kitchen LOVASTATIN PO Take 5 mg by mouth.    . polyethylene glycol (MIRALAX / GLYCOLAX) packet Take 17 g by mouth daily as needed. 60 each 0  . polyvinyl alcohol (LIQUIFILM TEARS) 1.4 % ophthalmic solution Place 1 drop into both eyes as needed for dry eyes. 15 mL 0  . senna-docusate (SENOKOT-S) 8.6-50 MG tablet Take 2 tablets by mouth at bedtime. 60 tablet 0  . gabapentin (NEURONTIN) 300 MG capsule Take 1 capsule (300 mg total) by mouth 3 (three) times daily. (Patient taking differently: Take 300 mg by mouth 2 (two) times daily. ) 90 capsule 1   No current facility-administered medications for this visit.  REVIEW OF SYSTEMS:  '[X]'$  denotes positive finding, '[ ]'$  denotes negative finding Cardiac  Comments:  Chest pain or chest pressure:    Shortness of breath upon exertion:    Short of breath when lying flat:    Irregular heart rhythm:        Vascular    Pain in calf, thigh, or hip brought on by ambulation:    Pain in feet at night that wakes you up from your sleep:     Blood clot in your veins:    Leg swelling:           PHYSICAL EXAM: Vitals:   05/09/18 1420  BP: (!) 162/71  Pulse: 73  Resp: 18  Temp: 98.2 F (36.8 C)  TempSrc: Oral    SpO2: 98%  Height: '5\' 2"'$  (1.575 m)    GENERAL: The patient is a well-nourished female, in no acute distress. The vital signs are documented above. CARDIOVASCULAR: She does have a palpable left femoral pulse.  I do not palpate a left popliteal pulse.  I do not palpate a right popliteal pulse. PULMONARY: There is good air exchange  MUSCULOSKELETAL: There are no major deformities or cyanosis.  Healed left below-knee amputation NEUROLOGIC: No focal weakness or paresthesias are detected. SKIN: There are no ulcers or rashes noted.  She does have some coolness in her left below-knee amputation stump but no tissue loss.  Her right foot has no tissue loss or fissures PSYCHIATRIC: The patient has a normal affect.  DATA:  I reviewed her arteriogram from February and March 2019 with her left and right leg runoff.  Reviewed this with the patient and her daughters regarding her above-knee popliteal occlusion in the left  MEDICAL ISSUES: I feel that her coolness and discomfort is most likely related to some ischemia in her left below-knee amputation.  Fortunately she has had no gangrenous changes or tissue loss.  I feel that her only option would be revision to above-knee but certainly do not see any indication for this currently.  The patient and daughters were reassured and will continue to keep a close eye on her right foot and her left below-knee amputation stump.  She will see [place] again on an as-needed basis    Rosetta Posner, MD [facility] Tel [phone] Pager [phone]

## 2018-05-16 ENCOUNTER — Ambulatory Visit: Payer: Medicare Other | Admitting: Vascular Surgery

## 2018-05-25 DIAGNOSIS — H353231 Exudative age-related macular degeneration, bilateral, with active choroidal neovascularization: Secondary | ICD-10-CM | POA: Diagnosis not present

## 2018-06-26 DIAGNOSIS — Z6827 Body mass index (BMI) 27.0-27.9, adult: Secondary | ICD-10-CM | POA: Diagnosis not present

## 2018-06-26 DIAGNOSIS — Z789 Other specified health status: Secondary | ICD-10-CM | POA: Diagnosis not present

## 2018-06-26 DIAGNOSIS — E1165 Type 2 diabetes mellitus with hyperglycemia: Secondary | ICD-10-CM | POA: Diagnosis not present

## 2018-06-26 DIAGNOSIS — R5383 Other fatigue: Secondary | ICD-10-CM | POA: Diagnosis not present

## 2018-06-26 DIAGNOSIS — R300 Dysuria: Secondary | ICD-10-CM | POA: Diagnosis not present

## 2018-06-26 DIAGNOSIS — R350 Frequency of micturition: Secondary | ICD-10-CM | POA: Diagnosis not present

## 2018-06-26 DIAGNOSIS — I10 Essential (primary) hypertension: Secondary | ICD-10-CM | POA: Diagnosis not present

## 2018-06-26 DIAGNOSIS — Z299 Encounter for prophylactic measures, unspecified: Secondary | ICD-10-CM | POA: Diagnosis not present

## 2018-07-07 DIAGNOSIS — G546 Phantom limb syndrome with pain: Secondary | ICD-10-CM | POA: Diagnosis not present

## 2018-07-07 DIAGNOSIS — I10 Essential (primary) hypertension: Secondary | ICD-10-CM | POA: Diagnosis not present

## 2018-07-07 DIAGNOSIS — N390 Urinary tract infection, site not specified: Secondary | ICD-10-CM | POA: Diagnosis not present

## 2018-07-07 DIAGNOSIS — Z299 Encounter for prophylactic measures, unspecified: Secondary | ICD-10-CM | POA: Diagnosis not present

## 2018-07-07 DIAGNOSIS — I639 Cerebral infarction, unspecified: Secondary | ICD-10-CM | POA: Diagnosis not present

## 2018-07-08 ENCOUNTER — Other Ambulatory Visit: Payer: Self-pay | Admitting: Physical Medicine and Rehabilitation

## 2018-07-27 DIAGNOSIS — M79674 Pain in right toe(s): Secondary | ICD-10-CM | POA: Diagnosis not present

## 2018-07-27 DIAGNOSIS — B351 Tinea unguium: Secondary | ICD-10-CM | POA: Diagnosis not present

## 2018-09-13 DIAGNOSIS — Z03818 Encounter for observation for suspected exposure to other biological agents ruled out: Secondary | ICD-10-CM | POA: Diagnosis not present

## 2018-10-12 DIAGNOSIS — M79674 Pain in right toe(s): Secondary | ICD-10-CM | POA: Diagnosis not present

## 2018-10-12 DIAGNOSIS — B351 Tinea unguium: Secondary | ICD-10-CM | POA: Diagnosis not present

## 2018-11-30 DIAGNOSIS — H31012 Macula scars of posterior pole (postinflammatory) (post-traumatic), left eye: Secondary | ICD-10-CM | POA: Diagnosis not present

## 2018-11-30 DIAGNOSIS — H353231 Exudative age-related macular degeneration, bilateral, with active choroidal neovascularization: Secondary | ICD-10-CM | POA: Diagnosis not present

## 2018-12-20 DIAGNOSIS — M79674 Pain in right toe(s): Secondary | ICD-10-CM | POA: Diagnosis not present

## 2018-12-20 DIAGNOSIS — B351 Tinea unguium: Secondary | ICD-10-CM | POA: Diagnosis not present

## 2019-01-17 DIAGNOSIS — Z23 Encounter for immunization: Secondary | ICD-10-CM | POA: Diagnosis not present

## 2019-01-24 DIAGNOSIS — Z03818 Encounter for observation for suspected exposure to other biological agents ruled out: Secondary | ICD-10-CM | POA: Diagnosis not present

## 2019-01-31 DIAGNOSIS — Z03818 Encounter for observation for suspected exposure to other biological agents ruled out: Secondary | ICD-10-CM | POA: Diagnosis not present

## 2019-02-16 DIAGNOSIS — Z03818 Encounter for observation for suspected exposure to other biological agents ruled out: Secondary | ICD-10-CM | POA: Diagnosis not present

## 2019-02-21 DIAGNOSIS — Z03818 Encounter for observation for suspected exposure to other biological agents ruled out: Secondary | ICD-10-CM | POA: Diagnosis not present

## 2019-02-27 DIAGNOSIS — B351 Tinea unguium: Secondary | ICD-10-CM | POA: Diagnosis not present

## 2019-02-27 DIAGNOSIS — M79674 Pain in right toe(s): Secondary | ICD-10-CM | POA: Diagnosis not present

## 2019-02-28 DIAGNOSIS — Z03818 Encounter for observation for suspected exposure to other biological agents ruled out: Secondary | ICD-10-CM | POA: Diagnosis not present

## 2019-03-01 DIAGNOSIS — I10 Essential (primary) hypertension: Secondary | ICD-10-CM | POA: Diagnosis not present

## 2019-03-01 DIAGNOSIS — E2839 Other primary ovarian failure: Secondary | ICD-10-CM | POA: Diagnosis not present

## 2019-03-01 DIAGNOSIS — Z7189 Other specified counseling: Secondary | ICD-10-CM | POA: Diagnosis not present

## 2019-03-01 DIAGNOSIS — Z299 Encounter for prophylactic measures, unspecified: Secondary | ICD-10-CM | POA: Diagnosis not present

## 2019-03-01 DIAGNOSIS — Z1331 Encounter for screening for depression: Secondary | ICD-10-CM | POA: Diagnosis not present

## 2019-03-01 DIAGNOSIS — Z1339 Encounter for screening examination for other mental health and behavioral disorders: Secondary | ICD-10-CM | POA: Diagnosis not present

## 2019-03-01 DIAGNOSIS — R5383 Other fatigue: Secondary | ICD-10-CM | POA: Diagnosis not present

## 2019-03-01 DIAGNOSIS — Z0000 Encounter for general adult medical examination without abnormal findings: Secondary | ICD-10-CM | POA: Diagnosis not present

## 2019-03-01 DIAGNOSIS — Z6827 Body mass index (BMI) 27.0-27.9, adult: Secondary | ICD-10-CM | POA: Diagnosis not present

## 2019-03-01 DIAGNOSIS — N390 Urinary tract infection, site not specified: Secondary | ICD-10-CM | POA: Diagnosis not present

## 2019-03-08 DIAGNOSIS — H353231 Exudative age-related macular degeneration, bilateral, with active choroidal neovascularization: Secondary | ICD-10-CM | POA: Diagnosis not present

## 2019-03-08 DIAGNOSIS — H43813 Vitreous degeneration, bilateral: Secondary | ICD-10-CM | POA: Diagnosis not present

## 2019-03-08 DIAGNOSIS — H353221 Exudative age-related macular degeneration, left eye, with active choroidal neovascularization: Secondary | ICD-10-CM | POA: Diagnosis not present

## 2019-03-08 DIAGNOSIS — H31012 Macula scars of posterior pole (postinflammatory) (post-traumatic), left eye: Secondary | ICD-10-CM | POA: Diagnosis not present

## 2019-03-08 DIAGNOSIS — H353211 Exudative age-related macular degeneration, right eye, with active choroidal neovascularization: Secondary | ICD-10-CM | POA: Diagnosis not present

## 2019-03-08 DIAGNOSIS — Z961 Presence of intraocular lens: Secondary | ICD-10-CM | POA: Diagnosis not present

## 2019-04-17 DIAGNOSIS — Z20828 Contact with and (suspected) exposure to other viral communicable diseases: Secondary | ICD-10-CM | POA: Diagnosis not present

## 2019-04-24 DIAGNOSIS — Z23 Encounter for immunization: Secondary | ICD-10-CM | POA: Diagnosis not present

## 2019-05-09 DIAGNOSIS — Z20828 Contact with and (suspected) exposure to other viral communicable diseases: Secondary | ICD-10-CM | POA: Diagnosis not present

## 2019-05-16 DIAGNOSIS — Z03818 Encounter for observation for suspected exposure to other biological agents ruled out: Secondary | ICD-10-CM | POA: Diagnosis not present

## 2019-05-22 DIAGNOSIS — Z23 Encounter for immunization: Secondary | ICD-10-CM | POA: Diagnosis not present

## 2019-05-23 DIAGNOSIS — Z03818 Encounter for observation for suspected exposure to other biological agents ruled out: Secondary | ICD-10-CM | POA: Diagnosis not present

## 2019-05-28 DIAGNOSIS — Z03818 Encounter for observation for suspected exposure to other biological agents ruled out: Secondary | ICD-10-CM | POA: Diagnosis not present

## 2019-05-29 DIAGNOSIS — H353232 Exudative age-related macular degeneration, bilateral, with inactive choroidal neovascularization: Secondary | ICD-10-CM | POA: Diagnosis not present

## 2019-05-30 DIAGNOSIS — Z03818 Encounter for observation for suspected exposure to other biological agents ruled out: Secondary | ICD-10-CM | POA: Diagnosis not present

## 2019-06-04 DIAGNOSIS — Z03818 Encounter for observation for suspected exposure to other biological agents ruled out: Secondary | ICD-10-CM | POA: Diagnosis not present

## 2019-06-06 DIAGNOSIS — Z03818 Encounter for observation for suspected exposure to other biological agents ruled out: Secondary | ICD-10-CM | POA: Diagnosis not present

## 2019-06-11 DIAGNOSIS — Z03818 Encounter for observation for suspected exposure to other biological agents ruled out: Secondary | ICD-10-CM | POA: Diagnosis not present

## 2019-06-13 DIAGNOSIS — Z03818 Encounter for observation for suspected exposure to other biological agents ruled out: Secondary | ICD-10-CM | POA: Diagnosis not present

## 2019-06-13 DIAGNOSIS — M79674 Pain in right toe(s): Secondary | ICD-10-CM | POA: Diagnosis not present

## 2019-06-13 DIAGNOSIS — B351 Tinea unguium: Secondary | ICD-10-CM | POA: Diagnosis not present

## 2019-06-28 DIAGNOSIS — H31012 Macula scars of posterior pole (postinflammatory) (post-traumatic), left eye: Secondary | ICD-10-CM | POA: Diagnosis not present

## 2019-06-28 DIAGNOSIS — H353211 Exudative age-related macular degeneration, right eye, with active choroidal neovascularization: Secondary | ICD-10-CM | POA: Diagnosis not present

## 2019-06-28 DIAGNOSIS — Z961 Presence of intraocular lens: Secondary | ICD-10-CM | POA: Diagnosis not present

## 2019-06-28 DIAGNOSIS — H353221 Exudative age-related macular degeneration, left eye, with active choroidal neovascularization: Secondary | ICD-10-CM | POA: Diagnosis not present

## 2019-08-07 IMAGING — DX DG ABDOMEN 1V
2 series · 2 of 2 positions shown · non-contrast
Comparison: Lumbar spine series of June 10, 2017

CLINICAL DATA: No bowel movement since amputation of the leg 1
weeks ago. There is abdominal distension.

EXAM:
ABDOMEN - 1 VIEW

[abdomen kub (1 of 2)]
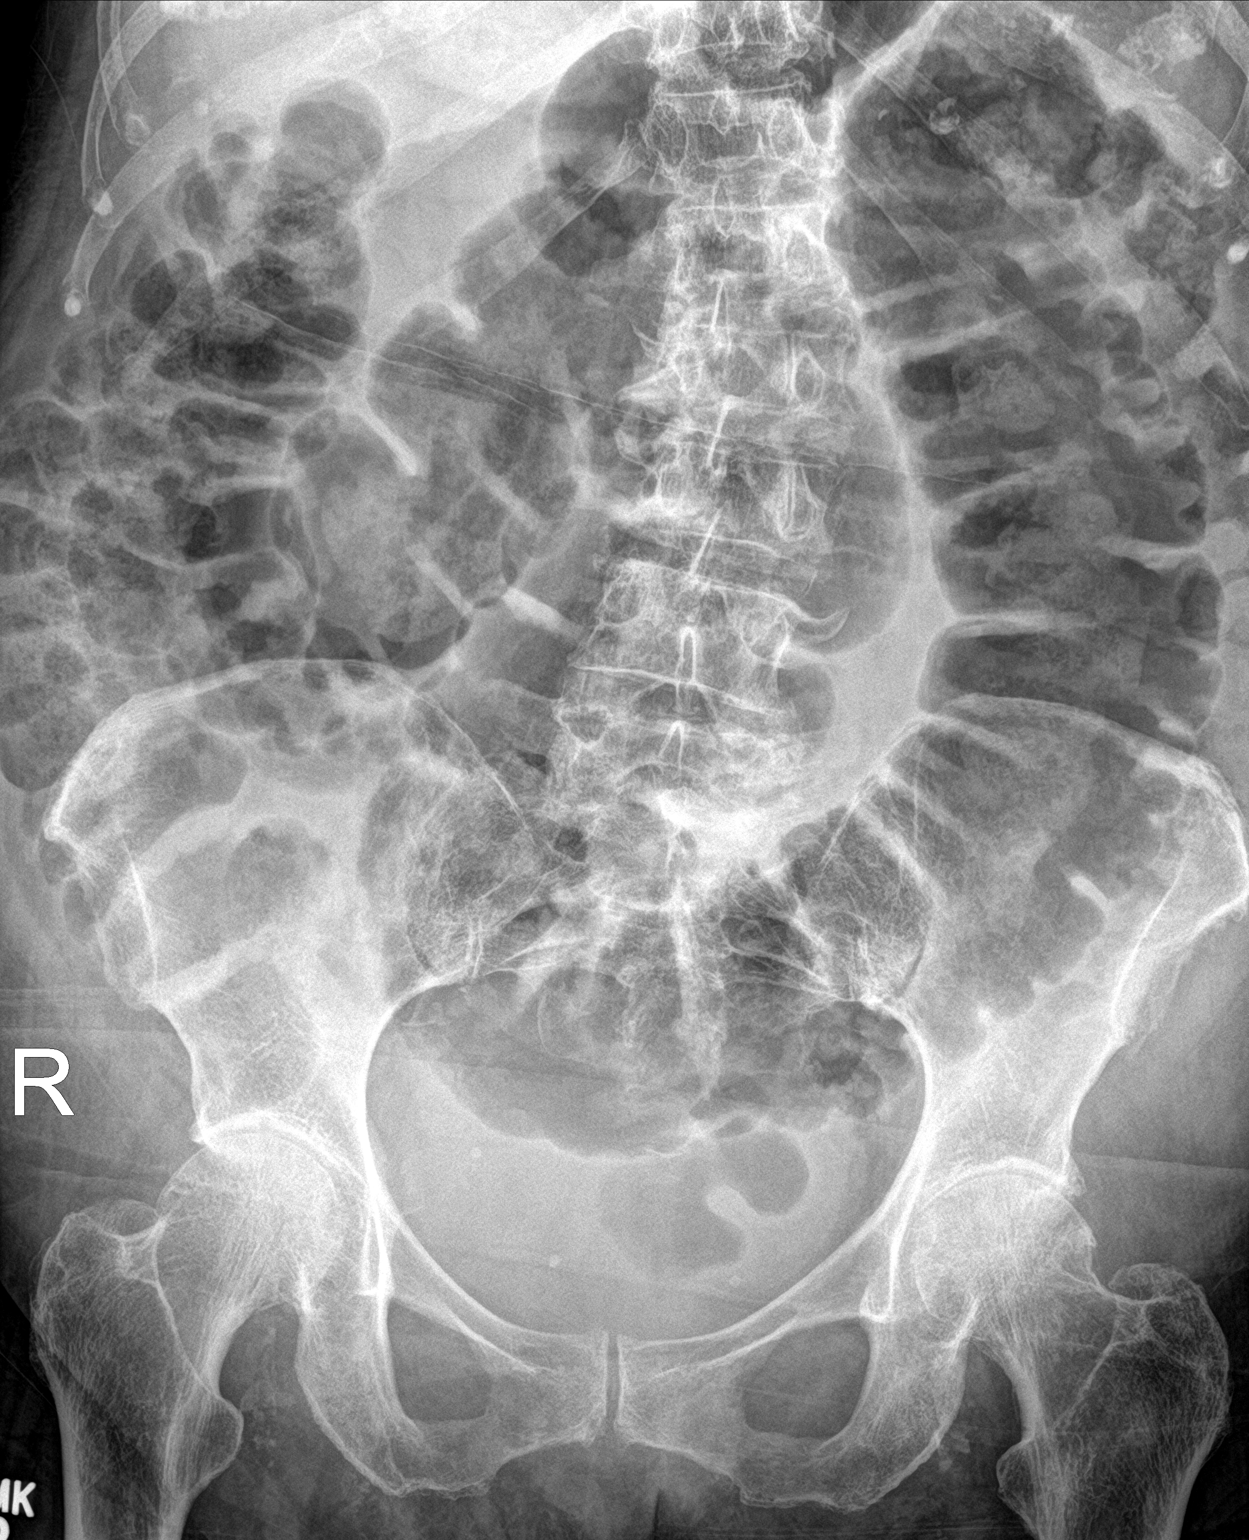

[abdomen kub (2 of 2)]
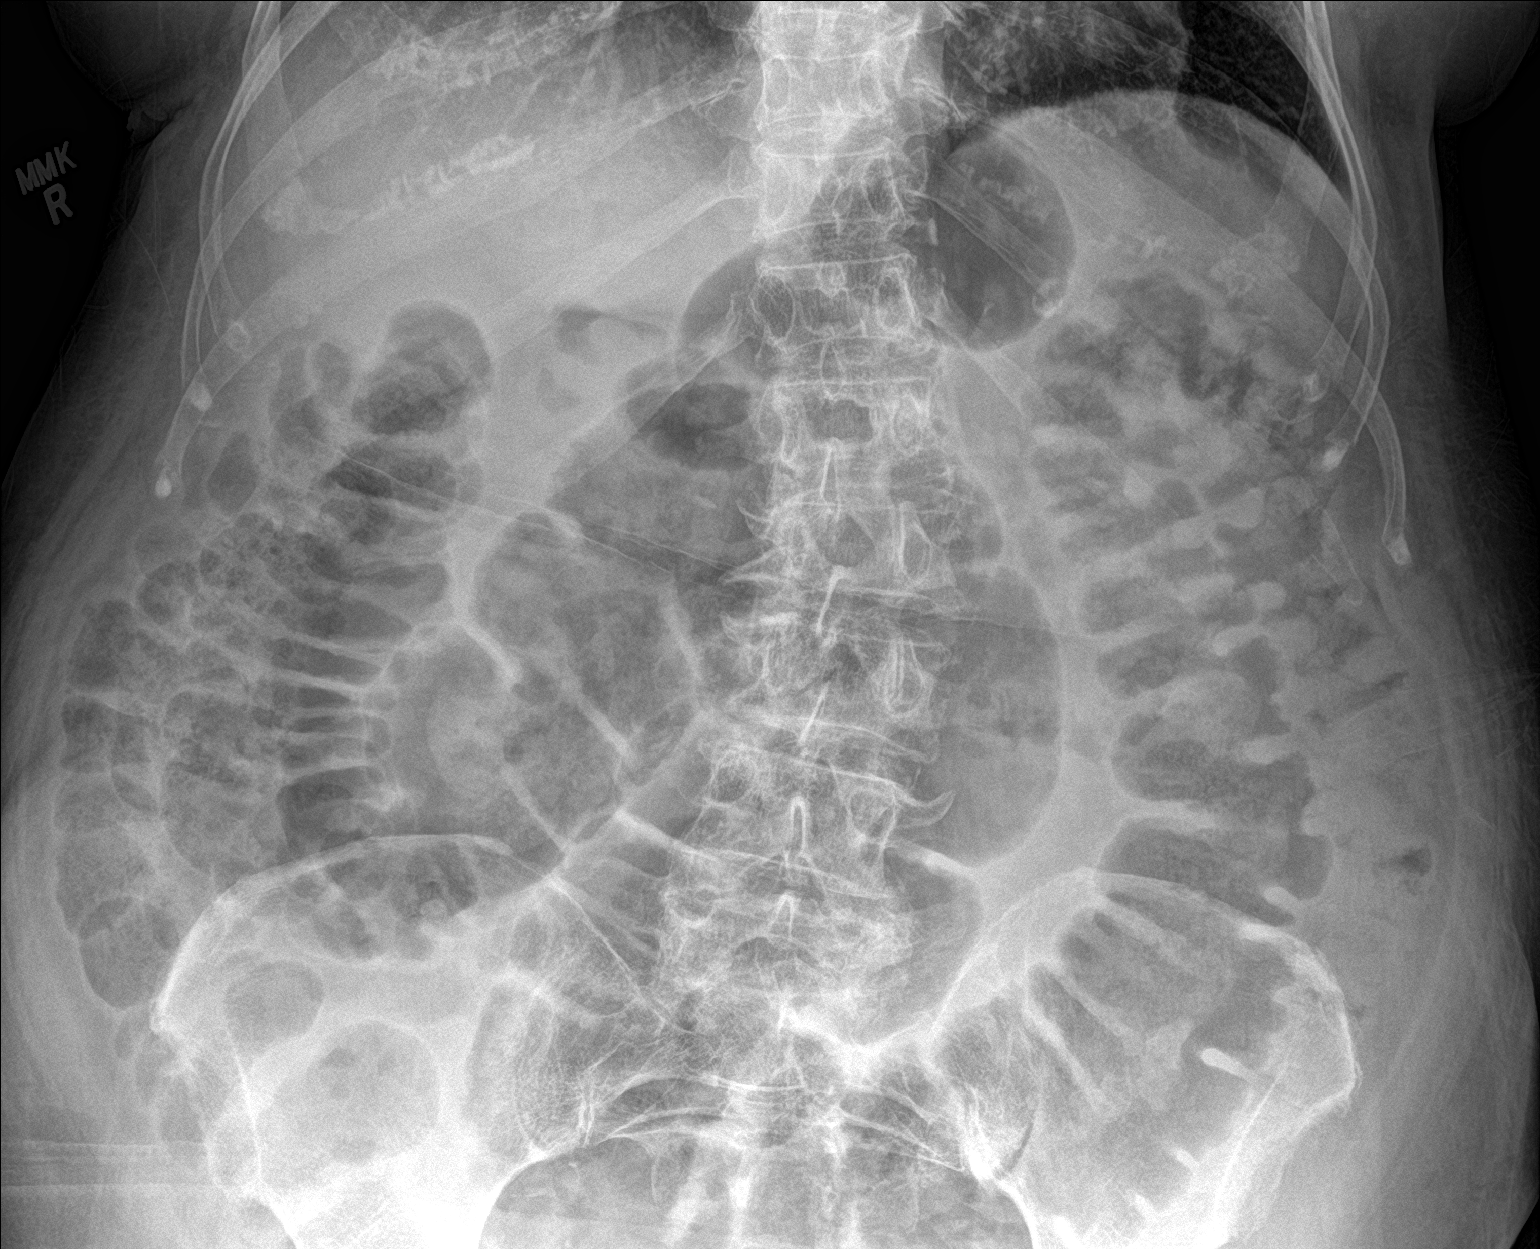

[2 of 2 positions shown; findings below may reference images not displayed]

FINDINGS: The stool burden is increased throughout the colon. There is no
evidence of a small bowel obstruction or fecal impaction. There is a
small amount of gas in the rectum. There is chronic levocurvature
centered in the mid lumbar spine. There are degenerative changes of
both hips.
IMPRESSION: Increased colonic stool burden diffusely consistent with
constipation. There is no evidence of ileus, obstruction, or fecal
impaction.

## 2019-08-21 DIAGNOSIS — M79674 Pain in right toe(s): Secondary | ICD-10-CM | POA: Diagnosis not present

## 2019-08-21 DIAGNOSIS — B351 Tinea unguium: Secondary | ICD-10-CM | POA: Diagnosis not present

## 2019-10-04 DIAGNOSIS — H31012 Macula scars of posterior pole (postinflammatory) (post-traumatic), left eye: Secondary | ICD-10-CM | POA: Diagnosis not present

## 2019-10-04 DIAGNOSIS — H353221 Exudative age-related macular degeneration, left eye, with active choroidal neovascularization: Secondary | ICD-10-CM | POA: Diagnosis not present

## 2019-10-04 DIAGNOSIS — Z961 Presence of intraocular lens: Secondary | ICD-10-CM | POA: Diagnosis not present

## 2019-10-04 DIAGNOSIS — H353211 Exudative age-related macular degeneration, right eye, with active choroidal neovascularization: Secondary | ICD-10-CM | POA: Diagnosis not present

## 2019-10-30 DIAGNOSIS — B351 Tinea unguium: Secondary | ICD-10-CM | POA: Diagnosis not present

## 2019-10-30 DIAGNOSIS — M79674 Pain in right toe(s): Secondary | ICD-10-CM | POA: Diagnosis not present

## 2019-12-27 DIAGNOSIS — Z89512 Acquired absence of left leg below knee: Secondary | ICD-10-CM | POA: Diagnosis not present

## 2019-12-27 DIAGNOSIS — Z299 Encounter for prophylactic measures, unspecified: Secondary | ICD-10-CM | POA: Diagnosis not present

## 2019-12-27 DIAGNOSIS — I739 Peripheral vascular disease, unspecified: Secondary | ICD-10-CM | POA: Diagnosis not present

## 2019-12-27 DIAGNOSIS — E1165 Type 2 diabetes mellitus with hyperglycemia: Secondary | ICD-10-CM | POA: Diagnosis not present

## 2019-12-27 DIAGNOSIS — I10 Essential (primary) hypertension: Secondary | ICD-10-CM | POA: Diagnosis not present

## 2020-01-15 DIAGNOSIS — M79674 Pain in right toe(s): Secondary | ICD-10-CM | POA: Diagnosis not present

## 2020-01-15 DIAGNOSIS — B351 Tinea unguium: Secondary | ICD-10-CM | POA: Diagnosis not present

## 2020-01-16 DIAGNOSIS — Z23 Encounter for immunization: Secondary | ICD-10-CM | POA: Diagnosis not present

## 2020-01-21 ENCOUNTER — Other Ambulatory Visit (HOSPITAL_COMMUNITY)
Admission: AD | Admit: 2020-01-21 | Discharge: 2020-01-21 | Disposition: A | Discharge status: Home / Self Care | Payer: Medicare Other | Source: Skilled Nursing Facility | Attending: Internal Medicine | Admitting: Internal Medicine

## 2020-01-21 DIAGNOSIS — R300 Dysuria: Secondary | ICD-10-CM | POA: Diagnosis not present

## 2020-01-21 LAB — URINALYSIS, COMPLETE (UACMP) WITH MICROSCOPIC
Bacteria, UA: NONE SEEN
Bilirubin Urine: NEGATIVE
Glucose, UA: NEGATIVE mg/dL
Hgb urine dipstick: NEGATIVE
Ketones, ur: NEGATIVE mg/dL
Nitrite: NEGATIVE
Protein, ur: NEGATIVE mg/dL
Specific Gravity, Urine: 1.012 (ref 1.005–1.030)
pH: 7 (ref 5.0–8.0)

## 2020-01-22 LAB — URINE CULTURE

## 2020-01-24 DIAGNOSIS — H353221 Exudative age-related macular degeneration, left eye, with active choroidal neovascularization: Secondary | ICD-10-CM | POA: Diagnosis not present

## 2020-01-24 DIAGNOSIS — H353211 Exudative age-related macular degeneration, right eye, with active choroidal neovascularization: Secondary | ICD-10-CM | POA: Diagnosis not present

## 2020-01-24 DIAGNOSIS — H31012 Macula scars of posterior pole (postinflammatory) (post-traumatic), left eye: Secondary | ICD-10-CM | POA: Diagnosis not present

## 2020-01-24 DIAGNOSIS — Z961 Presence of intraocular lens: Secondary | ICD-10-CM | POA: Diagnosis not present

## 2020-01-24 DIAGNOSIS — H26491 Other secondary cataract, right eye: Secondary | ICD-10-CM | POA: Diagnosis not present

## 2020-02-18 DIAGNOSIS — Z7189 Other specified counseling: Secondary | ICD-10-CM | POA: Diagnosis not present

## 2020-02-18 DIAGNOSIS — I10 Essential (primary) hypertension: Secondary | ICD-10-CM | POA: Diagnosis not present

## 2020-02-18 DIAGNOSIS — Z0000 Encounter for general adult medical examination without abnormal findings: Secondary | ICD-10-CM | POA: Diagnosis not present

## 2020-02-18 DIAGNOSIS — Z299 Encounter for prophylactic measures, unspecified: Secondary | ICD-10-CM | POA: Diagnosis not present

## 2020-02-18 DIAGNOSIS — Z1339 Encounter for screening examination for other mental health and behavioral disorders: Secondary | ICD-10-CM | POA: Diagnosis not present

## 2020-02-18 DIAGNOSIS — Z1331 Encounter for screening for depression: Secondary | ICD-10-CM | POA: Diagnosis not present

## 2020-02-18 DIAGNOSIS — Z79899 Other long term (current) drug therapy: Secondary | ICD-10-CM | POA: Diagnosis not present

## 2020-02-22 DIAGNOSIS — Z23 Encounter for immunization: Secondary | ICD-10-CM | POA: Diagnosis not present

## 2020-02-25 DIAGNOSIS — E1142 Type 2 diabetes mellitus with diabetic polyneuropathy: Secondary | ICD-10-CM | POA: Diagnosis not present

## 2020-02-25 DIAGNOSIS — Z7902 Long term (current) use of antithrombotics/antiplatelets: Secondary | ICD-10-CM | POA: Diagnosis not present

## 2020-02-25 DIAGNOSIS — J45909 Unspecified asthma, uncomplicated: Secondary | ICD-10-CM | POA: Diagnosis not present

## 2020-02-25 DIAGNOSIS — M160 Bilateral primary osteoarthritis of hip: Secondary | ICD-10-CM | POA: Diagnosis not present

## 2020-02-25 DIAGNOSIS — H3530 Unspecified macular degeneration: Secondary | ICD-10-CM | POA: Diagnosis not present

## 2020-02-25 DIAGNOSIS — M179 Osteoarthritis of knee, unspecified: Secondary | ICD-10-CM | POA: Diagnosis not present

## 2020-02-25 DIAGNOSIS — I129 Hypertensive chronic kidney disease with stage 1 through stage 4 chronic kidney disease, or unspecified chronic kidney disease: Secondary | ICD-10-CM | POA: Diagnosis not present

## 2020-02-25 DIAGNOSIS — Z9181 History of falling: Secondary | ICD-10-CM | POA: Diagnosis not present

## 2020-02-25 DIAGNOSIS — Z8673 Personal history of transient ischemic attack (TIA), and cerebral infarction without residual deficits: Secondary | ICD-10-CM | POA: Diagnosis not present

## 2020-02-25 DIAGNOSIS — E1122 Type 2 diabetes mellitus with diabetic chronic kidney disease: Secondary | ICD-10-CM | POA: Diagnosis not present

## 2020-02-25 DIAGNOSIS — M545 Low back pain, unspecified: Secondary | ICD-10-CM | POA: Diagnosis not present

## 2020-02-25 DIAGNOSIS — Z89512 Acquired absence of left leg below knee: Secondary | ICD-10-CM | POA: Diagnosis not present

## 2020-02-25 DIAGNOSIS — E1151 Type 2 diabetes mellitus with diabetic peripheral angiopathy without gangrene: Secondary | ICD-10-CM | POA: Diagnosis not present

## 2020-02-25 DIAGNOSIS — N183 Chronic kidney disease, stage 3 unspecified: Secondary | ICD-10-CM | POA: Diagnosis not present

## 2020-02-29 DIAGNOSIS — I129 Hypertensive chronic kidney disease with stage 1 through stage 4 chronic kidney disease, or unspecified chronic kidney disease: Secondary | ICD-10-CM | POA: Diagnosis not present

## 2020-02-29 DIAGNOSIS — E1151 Type 2 diabetes mellitus with diabetic peripheral angiopathy without gangrene: Secondary | ICD-10-CM | POA: Diagnosis not present

## 2020-02-29 DIAGNOSIS — M179 Osteoarthritis of knee, unspecified: Secondary | ICD-10-CM | POA: Diagnosis not present

## 2020-02-29 DIAGNOSIS — E1142 Type 2 diabetes mellitus with diabetic polyneuropathy: Secondary | ICD-10-CM | POA: Diagnosis not present

## 2020-02-29 DIAGNOSIS — M160 Bilateral primary osteoarthritis of hip: Secondary | ICD-10-CM | POA: Diagnosis not present

## 2020-02-29 DIAGNOSIS — E1122 Type 2 diabetes mellitus with diabetic chronic kidney disease: Secondary | ICD-10-CM | POA: Diagnosis not present

## 2020-03-04 DIAGNOSIS — I129 Hypertensive chronic kidney disease with stage 1 through stage 4 chronic kidney disease, or unspecified chronic kidney disease: Secondary | ICD-10-CM | POA: Diagnosis not present

## 2020-03-04 DIAGNOSIS — E1122 Type 2 diabetes mellitus with diabetic chronic kidney disease: Secondary | ICD-10-CM | POA: Diagnosis not present

## 2020-03-04 DIAGNOSIS — M179 Osteoarthritis of knee, unspecified: Secondary | ICD-10-CM | POA: Diagnosis not present

## 2020-03-04 DIAGNOSIS — E1151 Type 2 diabetes mellitus with diabetic peripheral angiopathy without gangrene: Secondary | ICD-10-CM | POA: Diagnosis not present

## 2020-03-04 DIAGNOSIS — E1142 Type 2 diabetes mellitus with diabetic polyneuropathy: Secondary | ICD-10-CM | POA: Diagnosis not present

## 2020-03-04 DIAGNOSIS — M160 Bilateral primary osteoarthritis of hip: Secondary | ICD-10-CM | POA: Diagnosis not present

## 2020-03-07 DIAGNOSIS — E1142 Type 2 diabetes mellitus with diabetic polyneuropathy: Secondary | ICD-10-CM | POA: Diagnosis not present

## 2020-03-07 DIAGNOSIS — M179 Osteoarthritis of knee, unspecified: Secondary | ICD-10-CM | POA: Diagnosis not present

## 2020-03-07 DIAGNOSIS — M160 Bilateral primary osteoarthritis of hip: Secondary | ICD-10-CM | POA: Diagnosis not present

## 2020-03-07 DIAGNOSIS — E1122 Type 2 diabetes mellitus with diabetic chronic kidney disease: Secondary | ICD-10-CM | POA: Diagnosis not present

## 2020-03-07 DIAGNOSIS — I129 Hypertensive chronic kidney disease with stage 1 through stage 4 chronic kidney disease, or unspecified chronic kidney disease: Secondary | ICD-10-CM | POA: Diagnosis not present

## 2020-03-07 DIAGNOSIS — E1151 Type 2 diabetes mellitus with diabetic peripheral angiopathy without gangrene: Secondary | ICD-10-CM | POA: Diagnosis not present

## 2020-03-10 DIAGNOSIS — E1151 Type 2 diabetes mellitus with diabetic peripheral angiopathy without gangrene: Secondary | ICD-10-CM | POA: Diagnosis not present

## 2020-03-10 DIAGNOSIS — M179 Osteoarthritis of knee, unspecified: Secondary | ICD-10-CM | POA: Diagnosis not present

## 2020-03-10 DIAGNOSIS — I129 Hypertensive chronic kidney disease with stage 1 through stage 4 chronic kidney disease, or unspecified chronic kidney disease: Secondary | ICD-10-CM | POA: Diagnosis not present

## 2020-03-10 DIAGNOSIS — M160 Bilateral primary osteoarthritis of hip: Secondary | ICD-10-CM | POA: Diagnosis not present

## 2020-03-10 DIAGNOSIS — E1142 Type 2 diabetes mellitus with diabetic polyneuropathy: Secondary | ICD-10-CM | POA: Diagnosis not present

## 2020-03-10 DIAGNOSIS — E1122 Type 2 diabetes mellitus with diabetic chronic kidney disease: Secondary | ICD-10-CM | POA: Diagnosis not present

## 2020-03-11 DIAGNOSIS — M179 Osteoarthritis of knee, unspecified: Secondary | ICD-10-CM | POA: Diagnosis not present

## 2020-03-11 DIAGNOSIS — E1151 Type 2 diabetes mellitus with diabetic peripheral angiopathy without gangrene: Secondary | ICD-10-CM | POA: Diagnosis not present

## 2020-03-11 DIAGNOSIS — E1122 Type 2 diabetes mellitus with diabetic chronic kidney disease: Secondary | ICD-10-CM | POA: Diagnosis not present

## 2020-03-11 DIAGNOSIS — E1142 Type 2 diabetes mellitus with diabetic polyneuropathy: Secondary | ICD-10-CM | POA: Diagnosis not present

## 2020-03-11 DIAGNOSIS — M160 Bilateral primary osteoarthritis of hip: Secondary | ICD-10-CM | POA: Diagnosis not present

## 2020-03-11 DIAGNOSIS — I129 Hypertensive chronic kidney disease with stage 1 through stage 4 chronic kidney disease, or unspecified chronic kidney disease: Secondary | ICD-10-CM | POA: Diagnosis not present

## 2020-03-14 DIAGNOSIS — J069 Acute upper respiratory infection, unspecified: Secondary | ICD-10-CM | POA: Diagnosis not present

## 2020-03-14 DIAGNOSIS — Z299 Encounter for prophylactic measures, unspecified: Secondary | ICD-10-CM | POA: Diagnosis not present

## 2020-03-20 DIAGNOSIS — M160 Bilateral primary osteoarthritis of hip: Secondary | ICD-10-CM | POA: Diagnosis not present

## 2020-03-20 DIAGNOSIS — E1122 Type 2 diabetes mellitus with diabetic chronic kidney disease: Secondary | ICD-10-CM | POA: Diagnosis not present

## 2020-03-20 DIAGNOSIS — M179 Osteoarthritis of knee, unspecified: Secondary | ICD-10-CM | POA: Diagnosis not present

## 2020-03-20 DIAGNOSIS — E1142 Type 2 diabetes mellitus with diabetic polyneuropathy: Secondary | ICD-10-CM | POA: Diagnosis not present

## 2020-03-20 DIAGNOSIS — I129 Hypertensive chronic kidney disease with stage 1 through stage 4 chronic kidney disease, or unspecified chronic kidney disease: Secondary | ICD-10-CM | POA: Diagnosis not present

## 2020-03-20 DIAGNOSIS — E1151 Type 2 diabetes mellitus with diabetic peripheral angiopathy without gangrene: Secondary | ICD-10-CM | POA: Diagnosis not present

## 2020-03-25 DIAGNOSIS — R059 Cough, unspecified: Secondary | ICD-10-CM | POA: Diagnosis not present

## 2020-03-26 DIAGNOSIS — M160 Bilateral primary osteoarthritis of hip: Secondary | ICD-10-CM | POA: Diagnosis not present

## 2020-03-26 DIAGNOSIS — M545 Low back pain, unspecified: Secondary | ICD-10-CM | POA: Diagnosis not present

## 2020-03-26 DIAGNOSIS — E1151 Type 2 diabetes mellitus with diabetic peripheral angiopathy without gangrene: Secondary | ICD-10-CM | POA: Diagnosis not present

## 2020-03-26 DIAGNOSIS — I129 Hypertensive chronic kidney disease with stage 1 through stage 4 chronic kidney disease, or unspecified chronic kidney disease: Secondary | ICD-10-CM | POA: Diagnosis not present

## 2020-03-26 DIAGNOSIS — E1122 Type 2 diabetes mellitus with diabetic chronic kidney disease: Secondary | ICD-10-CM | POA: Diagnosis not present

## 2020-03-26 DIAGNOSIS — M179 Osteoarthritis of knee, unspecified: Secondary | ICD-10-CM | POA: Diagnosis not present

## 2020-03-26 DIAGNOSIS — Z7902 Long term (current) use of antithrombotics/antiplatelets: Secondary | ICD-10-CM | POA: Diagnosis not present

## 2020-03-26 DIAGNOSIS — N183 Chronic kidney disease, stage 3 unspecified: Secondary | ICD-10-CM | POA: Diagnosis not present

## 2020-03-26 DIAGNOSIS — E1142 Type 2 diabetes mellitus with diabetic polyneuropathy: Secondary | ICD-10-CM | POA: Diagnosis not present

## 2020-03-26 DIAGNOSIS — Z9181 History of falling: Secondary | ICD-10-CM | POA: Diagnosis not present

## 2020-03-26 DIAGNOSIS — J45909 Unspecified asthma, uncomplicated: Secondary | ICD-10-CM | POA: Diagnosis not present

## 2020-03-26 DIAGNOSIS — Z89512 Acquired absence of left leg below knee: Secondary | ICD-10-CM | POA: Diagnosis not present

## 2020-03-26 DIAGNOSIS — H3530 Unspecified macular degeneration: Secondary | ICD-10-CM | POA: Diagnosis not present

## 2020-03-26 DIAGNOSIS — Z8673 Personal history of transient ischemic attack (TIA), and cerebral infarction without residual deficits: Secondary | ICD-10-CM | POA: Diagnosis not present

## 2020-04-01 DIAGNOSIS — B351 Tinea unguium: Secondary | ICD-10-CM | POA: Diagnosis not present

## 2020-04-01 DIAGNOSIS — M79674 Pain in right toe(s): Secondary | ICD-10-CM | POA: Diagnosis not present

## 2020-04-04 DIAGNOSIS — E1142 Type 2 diabetes mellitus with diabetic polyneuropathy: Secondary | ICD-10-CM | POA: Diagnosis not present

## 2020-04-04 DIAGNOSIS — Z79899 Other long term (current) drug therapy: Secondary | ICD-10-CM | POA: Diagnosis not present

## 2020-04-04 DIAGNOSIS — E119 Type 2 diabetes mellitus without complications: Secondary | ICD-10-CM | POA: Diagnosis not present

## 2020-04-04 DIAGNOSIS — E1122 Type 2 diabetes mellitus with diabetic chronic kidney disease: Secondary | ICD-10-CM | POA: Diagnosis not present

## 2020-04-04 DIAGNOSIS — M160 Bilateral primary osteoarthritis of hip: Secondary | ICD-10-CM | POA: Diagnosis not present

## 2020-04-04 DIAGNOSIS — E1151 Type 2 diabetes mellitus with diabetic peripheral angiopathy without gangrene: Secondary | ICD-10-CM | POA: Diagnosis not present

## 2020-04-04 DIAGNOSIS — I10 Essential (primary) hypertension: Secondary | ICD-10-CM | POA: Diagnosis not present

## 2020-04-04 DIAGNOSIS — I129 Hypertensive chronic kidney disease with stage 1 through stage 4 chronic kidney disease, or unspecified chronic kidney disease: Secondary | ICD-10-CM | POA: Diagnosis not present

## 2020-04-04 DIAGNOSIS — M179 Osteoarthritis of knee, unspecified: Secondary | ICD-10-CM | POA: Diagnosis not present

## 2020-04-24 DIAGNOSIS — H31012 Macula scars of posterior pole (postinflammatory) (post-traumatic), left eye: Secondary | ICD-10-CM | POA: Diagnosis not present

## 2020-04-24 DIAGNOSIS — H26491 Other secondary cataract, right eye: Secondary | ICD-10-CM | POA: Diagnosis not present

## 2020-04-30 DIAGNOSIS — G4700 Insomnia, unspecified: Secondary | ICD-10-CM | POA: Diagnosis not present

## 2020-05-12 DIAGNOSIS — M1991 Primary osteoarthritis, unspecified site: Secondary | ICD-10-CM | POA: Diagnosis not present

## 2020-05-12 DIAGNOSIS — E1142 Type 2 diabetes mellitus with diabetic polyneuropathy: Secondary | ICD-10-CM | POA: Diagnosis not present

## 2020-05-14 DIAGNOSIS — E1142 Type 2 diabetes mellitus with diabetic polyneuropathy: Secondary | ICD-10-CM | POA: Diagnosis not present

## 2020-05-14 DIAGNOSIS — M1991 Primary osteoarthritis, unspecified site: Secondary | ICD-10-CM | POA: Diagnosis not present

## 2020-05-14 DIAGNOSIS — E559 Vitamin D deficiency, unspecified: Secondary | ICD-10-CM | POA: Diagnosis not present

## 2020-05-14 DIAGNOSIS — I739 Peripheral vascular disease, unspecified: Secondary | ICD-10-CM | POA: Diagnosis not present

## 2020-05-14 DIAGNOSIS — D519 Vitamin B12 deficiency anemia, unspecified: Secondary | ICD-10-CM | POA: Diagnosis not present

## 2020-05-14 DIAGNOSIS — E782 Mixed hyperlipidemia: Secondary | ICD-10-CM | POA: Diagnosis not present

## 2020-05-14 DIAGNOSIS — I10 Essential (primary) hypertension: Secondary | ICD-10-CM | POA: Diagnosis not present

## 2020-05-14 DIAGNOSIS — E113513 Type 2 diabetes mellitus with proliferative diabetic retinopathy with macular edema, bilateral: Secondary | ICD-10-CM | POA: Diagnosis not present

## 2020-05-14 DIAGNOSIS — N1832 Chronic kidney disease, stage 3b: Secondary | ICD-10-CM | POA: Diagnosis not present

## 2020-05-28 DIAGNOSIS — I10 Essential (primary) hypertension: Secondary | ICD-10-CM | POA: Diagnosis not present

## 2020-05-28 DIAGNOSIS — N1832 Chronic kidney disease, stage 3b: Secondary | ICD-10-CM | POA: Diagnosis not present

## 2020-05-28 DIAGNOSIS — H6121 Impacted cerumen, right ear: Secondary | ICD-10-CM | POA: Diagnosis not present

## 2020-05-28 DIAGNOSIS — E1142 Type 2 diabetes mellitus with diabetic polyneuropathy: Secondary | ICD-10-CM | POA: Diagnosis not present

## 2020-05-28 DIAGNOSIS — I739 Peripheral vascular disease, unspecified: Secondary | ICD-10-CM | POA: Diagnosis not present

## 2020-05-28 DIAGNOSIS — M1991 Primary osteoarthritis, unspecified site: Secondary | ICD-10-CM | POA: Diagnosis not present

## 2020-06-11 DIAGNOSIS — D519 Vitamin B12 deficiency anemia, unspecified: Secondary | ICD-10-CM | POA: Diagnosis not present

## 2020-06-11 DIAGNOSIS — E559 Vitamin D deficiency, unspecified: Secondary | ICD-10-CM | POA: Diagnosis not present

## 2020-06-11 DIAGNOSIS — E1142 Type 2 diabetes mellitus with diabetic polyneuropathy: Secondary | ICD-10-CM | POA: Diagnosis not present

## 2020-06-11 DIAGNOSIS — N1832 Chronic kidney disease, stage 3b: Secondary | ICD-10-CM | POA: Diagnosis not present

## 2020-06-11 DIAGNOSIS — M1991 Primary osteoarthritis, unspecified site: Secondary | ICD-10-CM | POA: Diagnosis not present

## 2020-06-11 DIAGNOSIS — K5900 Constipation, unspecified: Secondary | ICD-10-CM | POA: Diagnosis not present

## 2020-06-11 DIAGNOSIS — G4700 Insomnia, unspecified: Secondary | ICD-10-CM | POA: Diagnosis not present

## 2020-06-11 DIAGNOSIS — I10 Essential (primary) hypertension: Secondary | ICD-10-CM | POA: Diagnosis not present

## 2020-06-11 DIAGNOSIS — E782 Mixed hyperlipidemia: Secondary | ICD-10-CM | POA: Diagnosis not present

## 2020-06-11 DIAGNOSIS — I739 Peripheral vascular disease, unspecified: Secondary | ICD-10-CM | POA: Diagnosis not present

## 2020-06-13 DIAGNOSIS — N1832 Chronic kidney disease, stage 3b: Secondary | ICD-10-CM | POA: Diagnosis not present

## 2020-06-13 DIAGNOSIS — E559 Vitamin D deficiency, unspecified: Secondary | ICD-10-CM | POA: Diagnosis not present

## 2020-06-13 DIAGNOSIS — D519 Vitamin B12 deficiency anemia, unspecified: Secondary | ICD-10-CM | POA: Diagnosis not present

## 2020-06-17 DIAGNOSIS — M79674 Pain in right toe(s): Secondary | ICD-10-CM | POA: Diagnosis not present

## 2020-06-17 DIAGNOSIS — B351 Tinea unguium: Secondary | ICD-10-CM | POA: Diagnosis not present

## 2020-06-23 DIAGNOSIS — E1142 Type 2 diabetes mellitus with diabetic polyneuropathy: Secondary | ICD-10-CM | POA: Diagnosis not present

## 2020-07-02 DIAGNOSIS — R110 Nausea: Secondary | ICD-10-CM | POA: Diagnosis not present

## 2020-07-02 DIAGNOSIS — E1142 Type 2 diabetes mellitus with diabetic polyneuropathy: Secondary | ICD-10-CM | POA: Diagnosis not present

## 2020-07-02 DIAGNOSIS — I10 Essential (primary) hypertension: Secondary | ICD-10-CM | POA: Diagnosis not present

## 2020-07-02 DIAGNOSIS — R42 Dizziness and giddiness: Secondary | ICD-10-CM | POA: Diagnosis not present

## 2020-07-02 DIAGNOSIS — H6121 Impacted cerumen, right ear: Secondary | ICD-10-CM | POA: Diagnosis not present

## 2020-07-02 DIAGNOSIS — M1991 Primary osteoarthritis, unspecified site: Secondary | ICD-10-CM | POA: Diagnosis not present

## 2020-07-02 DIAGNOSIS — H9201 Otalgia, right ear: Secondary | ICD-10-CM | POA: Diagnosis not present

## 2020-07-09 DIAGNOSIS — R42 Dizziness and giddiness: Secondary | ICD-10-CM | POA: Diagnosis not present

## 2020-07-09 DIAGNOSIS — N1832 Chronic kidney disease, stage 3b: Secondary | ICD-10-CM | POA: Diagnosis not present

## 2020-07-09 DIAGNOSIS — M19011 Primary osteoarthritis, right shoulder: Secondary | ICD-10-CM | POA: Diagnosis not present

## 2020-07-09 DIAGNOSIS — E1142 Type 2 diabetes mellitus with diabetic polyneuropathy: Secondary | ICD-10-CM | POA: Diagnosis not present

## 2020-07-09 DIAGNOSIS — K5900 Constipation, unspecified: Secondary | ICD-10-CM | POA: Diagnosis not present

## 2020-07-09 DIAGNOSIS — G546 Phantom limb syndrome with pain: Secondary | ICD-10-CM | POA: Diagnosis not present

## 2020-07-09 DIAGNOSIS — I10 Essential (primary) hypertension: Secondary | ICD-10-CM | POA: Diagnosis not present

## 2020-07-09 DIAGNOSIS — H9201 Otalgia, right ear: Secondary | ICD-10-CM | POA: Diagnosis not present

## 2020-07-09 DIAGNOSIS — E559 Vitamin D deficiency, unspecified: Secondary | ICD-10-CM | POA: Diagnosis not present

## 2020-07-09 DIAGNOSIS — R110 Nausea: Secondary | ICD-10-CM | POA: Diagnosis not present

## 2020-07-09 DIAGNOSIS — G4700 Insomnia, unspecified: Secondary | ICD-10-CM | POA: Diagnosis not present

## 2020-07-15 DIAGNOSIS — G3184 Mild cognitive impairment, so stated: Secondary | ICD-10-CM | POA: Diagnosis not present

## 2020-07-15 DIAGNOSIS — G4700 Insomnia, unspecified: Secondary | ICD-10-CM | POA: Diagnosis not present

## 2020-07-17 DIAGNOSIS — H353231 Exudative age-related macular degeneration, bilateral, with active choroidal neovascularization: Secondary | ICD-10-CM | POA: Diagnosis not present

## 2020-07-17 DIAGNOSIS — H26491 Other secondary cataract, right eye: Secondary | ICD-10-CM | POA: Diagnosis not present

## 2020-07-17 DIAGNOSIS — H31012 Macula scars of posterior pole (postinflammatory) (post-traumatic), left eye: Secondary | ICD-10-CM | POA: Diagnosis not present

## 2020-08-06 DIAGNOSIS — G4700 Insomnia, unspecified: Secondary | ICD-10-CM | POA: Diagnosis not present

## 2020-08-06 DIAGNOSIS — E1142 Type 2 diabetes mellitus with diabetic polyneuropathy: Secondary | ICD-10-CM | POA: Diagnosis not present

## 2020-08-06 DIAGNOSIS — I70293 Other atherosclerosis of native arteries of extremities, bilateral legs: Secondary | ICD-10-CM | POA: Diagnosis not present

## 2020-08-06 DIAGNOSIS — N1832 Chronic kidney disease, stage 3b: Secondary | ICD-10-CM | POA: Diagnosis not present

## 2020-08-06 DIAGNOSIS — G63 Polyneuropathy in diseases classified elsewhere: Secondary | ICD-10-CM | POA: Diagnosis not present

## 2020-08-06 DIAGNOSIS — M19011 Primary osteoarthritis, right shoulder: Secondary | ICD-10-CM | POA: Diagnosis not present

## 2020-08-06 DIAGNOSIS — K5900 Constipation, unspecified: Secondary | ICD-10-CM | POA: Diagnosis not present

## 2020-08-06 DIAGNOSIS — I10 Essential (primary) hypertension: Secondary | ICD-10-CM | POA: Diagnosis not present

## 2020-08-06 DIAGNOSIS — R400 Somnolence: Secondary | ICD-10-CM | POA: Diagnosis not present

## 2020-08-06 DIAGNOSIS — E559 Vitamin D deficiency, unspecified: Secondary | ICD-10-CM | POA: Diagnosis not present

## 2020-08-06 DIAGNOSIS — G546 Phantom limb syndrome with pain: Secondary | ICD-10-CM | POA: Diagnosis not present

## 2020-08-13 DIAGNOSIS — K5900 Constipation, unspecified: Secondary | ICD-10-CM | POA: Diagnosis not present

## 2020-08-13 DIAGNOSIS — E1142 Type 2 diabetes mellitus with diabetic polyneuropathy: Secondary | ICD-10-CM | POA: Diagnosis not present

## 2020-08-13 DIAGNOSIS — G546 Phantom limb syndrome with pain: Secondary | ICD-10-CM | POA: Diagnosis not present

## 2020-08-13 DIAGNOSIS — B0230 Zoster ocular disease, unspecified: Secondary | ICD-10-CM | POA: Diagnosis not present

## 2020-08-13 DIAGNOSIS — B029 Zoster without complications: Secondary | ICD-10-CM | POA: Diagnosis not present

## 2020-08-13 DIAGNOSIS — I10 Essential (primary) hypertension: Secondary | ICD-10-CM | POA: Diagnosis not present

## 2020-08-14 DIAGNOSIS — B029 Zoster without complications: Secondary | ICD-10-CM | POA: Diagnosis not present

## 2020-08-14 DIAGNOSIS — H353211 Exudative age-related macular degeneration, right eye, with active choroidal neovascularization: Secondary | ICD-10-CM | POA: Diagnosis not present

## 2020-08-14 DIAGNOSIS — H31012 Macula scars of posterior pole (postinflammatory) (post-traumatic), left eye: Secondary | ICD-10-CM | POA: Diagnosis not present

## 2020-08-14 DIAGNOSIS — H353221 Exudative age-related macular degeneration, left eye, with active choroidal neovascularization: Secondary | ICD-10-CM | POA: Diagnosis not present

## 2020-08-18 DIAGNOSIS — R051 Acute cough: Secondary | ICD-10-CM | POA: Diagnosis not present

## 2020-08-20 DIAGNOSIS — G3184 Mild cognitive impairment, so stated: Secondary | ICD-10-CM | POA: Diagnosis not present

## 2020-08-20 DIAGNOSIS — E1142 Type 2 diabetes mellitus with diabetic polyneuropathy: Secondary | ICD-10-CM | POA: Diagnosis not present

## 2020-08-20 DIAGNOSIS — I10 Essential (primary) hypertension: Secondary | ICD-10-CM | POA: Diagnosis not present

## 2020-08-20 DIAGNOSIS — R059 Cough, unspecified: Secondary | ICD-10-CM | POA: Diagnosis not present

## 2020-08-20 DIAGNOSIS — R110 Nausea: Secondary | ICD-10-CM | POA: Diagnosis not present

## 2020-08-20 DIAGNOSIS — R051 Acute cough: Secondary | ICD-10-CM | POA: Diagnosis not present

## 2020-08-20 DIAGNOSIS — N1832 Chronic kidney disease, stage 3b: Secondary | ICD-10-CM | POA: Diagnosis not present

## 2020-08-20 DIAGNOSIS — B029 Zoster without complications: Secondary | ICD-10-CM | POA: Diagnosis not present

## 2020-08-21 ENCOUNTER — Other Ambulatory Visit: Payer: Self-pay

## 2020-08-21 ENCOUNTER — Encounter (HOSPITAL_COMMUNITY): Payer: Self-pay

## 2020-08-21 ENCOUNTER — Emergency Department (HOSPITAL_COMMUNITY): Payer: Medicare Other

## 2020-08-21 ENCOUNTER — Inpatient Hospital Stay (HOSPITAL_COMMUNITY)
Admission: EM | Admit: 2020-08-21 | Discharge: 2020-08-28 | DRG: 202 | Disposition: A | Discharge status: Nursing Home (Includes ICF) | Payer: Medicare Other | Source: Skilled Nursing Facility | Attending: Family Medicine | Admitting: Family Medicine

## 2020-08-21 DIAGNOSIS — Z7902 Long term (current) use of antithrombotics/antiplatelets: Secondary | ICD-10-CM

## 2020-08-21 DIAGNOSIS — N183 Chronic kidney disease, stage 3 unspecified: Secondary | ICD-10-CM | POA: Diagnosis present

## 2020-08-21 DIAGNOSIS — I70201 Unspecified atherosclerosis of native arteries of extremities, right leg: Secondary | ICD-10-CM | POA: Diagnosis not present

## 2020-08-21 DIAGNOSIS — J209 Acute bronchitis, unspecified: Secondary | ICD-10-CM | POA: Diagnosis not present

## 2020-08-21 DIAGNOSIS — E872 Acidosis: Secondary | ICD-10-CM | POA: Diagnosis present

## 2020-08-21 DIAGNOSIS — E782 Mixed hyperlipidemia: Secondary | ICD-10-CM

## 2020-08-21 DIAGNOSIS — Z8673 Personal history of transient ischemic attack (TIA), and cerebral infarction without residual deficits: Secondary | ICD-10-CM

## 2020-08-21 DIAGNOSIS — E1151 Type 2 diabetes mellitus with diabetic peripheral angiopathy without gangrene: Secondary | ICD-10-CM | POA: Diagnosis present

## 2020-08-21 DIAGNOSIS — I129 Hypertensive chronic kidney disease with stage 1 through stage 4 chronic kidney disease, or unspecified chronic kidney disease: Secondary | ICD-10-CM | POA: Diagnosis present

## 2020-08-21 DIAGNOSIS — J208 Acute bronchitis due to other specified organisms: Secondary | ICD-10-CM | POA: Diagnosis not present

## 2020-08-21 DIAGNOSIS — M1990 Unspecified osteoarthritis, unspecified site: Secondary | ICD-10-CM | POA: Diagnosis present

## 2020-08-21 DIAGNOSIS — F0390 Unspecified dementia without behavioral disturbance: Secondary | ICD-10-CM | POA: Diagnosis present

## 2020-08-21 DIAGNOSIS — G4700 Insomnia, unspecified: Secondary | ICD-10-CM | POA: Diagnosis not present

## 2020-08-21 DIAGNOSIS — R0602 Shortness of breath: Secondary | ICD-10-CM | POA: Diagnosis not present

## 2020-08-21 DIAGNOSIS — E785 Hyperlipidemia, unspecified: Secondary | ICD-10-CM | POA: Diagnosis not present

## 2020-08-21 DIAGNOSIS — Z888 Allergy status to other drugs, medicaments and biological substances status: Secondary | ICD-10-CM | POA: Diagnosis not present

## 2020-08-21 DIAGNOSIS — I739 Peripheral vascular disease, unspecified: Secondary | ICD-10-CM | POA: Diagnosis not present

## 2020-08-21 DIAGNOSIS — I10 Essential (primary) hypertension: Secondary | ICD-10-CM | POA: Diagnosis not present

## 2020-08-21 DIAGNOSIS — R059 Cough, unspecified: Secondary | ICD-10-CM | POA: Diagnosis not present

## 2020-08-21 DIAGNOSIS — Z89512 Acquired absence of left leg below knee: Secondary | ICD-10-CM | POA: Diagnosis not present

## 2020-08-21 DIAGNOSIS — R0902 Hypoxemia: Secondary | ICD-10-CM | POA: Diagnosis not present

## 2020-08-21 DIAGNOSIS — J439 Emphysema, unspecified: Secondary | ICD-10-CM | POA: Diagnosis present

## 2020-08-21 DIAGNOSIS — Z20822 Contact with and (suspected) exposure to covid-19: Secondary | ICD-10-CM | POA: Diagnosis not present

## 2020-08-21 DIAGNOSIS — Z79899 Other long term (current) drug therapy: Secondary | ICD-10-CM | POA: Diagnosis not present

## 2020-08-21 DIAGNOSIS — B9781 Human metapneumovirus as the cause of diseases classified elsewhere: Secondary | ICD-10-CM | POA: Diagnosis present

## 2020-08-21 DIAGNOSIS — I491 Atrial premature depolarization: Secondary | ICD-10-CM | POA: Diagnosis not present

## 2020-08-21 DIAGNOSIS — N1832 Chronic kidney disease, stage 3b: Secondary | ICD-10-CM | POA: Diagnosis not present

## 2020-08-21 DIAGNOSIS — E1122 Type 2 diabetes mellitus with diabetic chronic kidney disease: Secondary | ICD-10-CM | POA: Diagnosis not present

## 2020-08-21 DIAGNOSIS — R062 Wheezing: Secondary | ICD-10-CM | POA: Diagnosis not present

## 2020-08-21 DIAGNOSIS — I700 Atherosclerosis of aorta: Secondary | ICD-10-CM | POA: Diagnosis not present

## 2020-08-21 DIAGNOSIS — F05 Delirium due to known physiological condition: Secondary | ICD-10-CM | POA: Diagnosis present

## 2020-08-21 DIAGNOSIS — R0689 Other abnormalities of breathing: Secondary | ICD-10-CM | POA: Diagnosis not present

## 2020-08-21 DIAGNOSIS — Z90710 Acquired absence of both cervix and uterus: Secondary | ICD-10-CM

## 2020-08-21 LAB — CBC WITH DIFFERENTIAL/PLATELET
Abs Immature Granulocytes: 0.02 10*3/uL (ref 0.00–0.07)
Basophils Absolute: 0 10*3/uL (ref 0.0–0.1)
Basophils Relative: 0 %
Eosinophils Absolute: 0.1 10*3/uL (ref 0.0–0.5)
Eosinophils Relative: 1 %
HCT: 46.2 % — ABNORMAL HIGH (ref 36.0–46.0)
Hemoglobin: 14.9 g/dL (ref 12.0–15.0)
Immature Granulocytes: 0 %
Lymphocytes Relative: 22 %
Lymphs Abs: 1.4 10*3/uL (ref 0.7–4.0)
MCH: 29.6 pg (ref 26.0–34.0)
MCHC: 32.3 g/dL (ref 30.0–36.0)
MCV: 91.7 fL (ref 80.0–100.0)
Monocytes Absolute: 0.9 10*3/uL (ref 0.1–1.0)
Monocytes Relative: 15 %
Neutro Abs: 3.9 10*3/uL (ref 1.7–7.7)
Neutrophils Relative %: 62 %
Platelets: 229 10*3/uL (ref 150–400)
RBC: 5.04 MIL/uL (ref 3.87–5.11)
RDW: 13.6 % (ref 11.5–15.5)
WBC: 6.3 10*3/uL (ref 4.0–10.5)
nRBC: 0 % (ref 0.0–0.2)

## 2020-08-21 LAB — COMPREHENSIVE METABOLIC PANEL
ALT: 18 U/L (ref 0–44)
AST: 23 U/L (ref 15–41)
Albumin: 3.9 g/dL (ref 3.5–5.0)
Alkaline Phosphatase: 64 U/L (ref 38–126)
Anion gap: 10 (ref 5–15)
BUN: 21 mg/dL (ref 8–23)
CO2: 26 mmol/L (ref 22–32)
Calcium: 9.3 mg/dL (ref 8.9–10.3)
Chloride: 99 mmol/L (ref 98–111)
Creatinine, Ser: 1.19 mg/dL — ABNORMAL HIGH (ref 0.44–1.00)
GFR, Estimated: 43 mL/min — ABNORMAL LOW (ref >60)
Glucose, Bld: 118 mg/dL — ABNORMAL HIGH (ref 70–99)
Potassium: 4.1 mmol/L (ref 3.5–5.1)
Sodium: 135 mmol/L (ref 135–145)
Total Bilirubin: 0.6 mg/dL (ref 0.3–1.2)
Total Protein: 7.1 g/dL (ref 6.5–8.1)

## 2020-08-21 LAB — RESP PANEL BY RT-PCR (FLU A&B, COVID) ARPGX2
Influenza A by PCR: NEGATIVE
Influenza B by PCR: NEGATIVE
SARS Coronavirus 2 by RT PCR: NEGATIVE

## 2020-08-21 LAB — LACTIC ACID, PLASMA
Lactic Acid, Venous: 1.1 mmol/L (ref 0.5–1.9)
Lactic Acid, Venous: 1.3 mmol/L (ref 0.5–1.9)

## 2020-08-21 MED ORDER — METHYLPREDNISOLONE SODIUM SUCC 40 MG IJ SOLR
125.0000 mg | Freq: Once | INTRAMUSCULAR | Status: AC
  Administered 2020-08-21: 125 mg via INTRAVENOUS
  Filled 2020-08-21: qty 4

## 2020-08-21 MED ORDER — SODIUM CHLORIDE 0.9 % IV BOLUS
1000.0000 mL | Freq: Once | INTRAVENOUS | Status: AC
  Administered 2020-08-21: 1000 mL via INTRAVENOUS

## 2020-08-21 MED ORDER — IPRATROPIUM-ALBUTEROL 0.5-2.5 (3) MG/3ML IN SOLN
3.0000 mL | Freq: Once | RESPIRATORY_TRACT | Status: AC
  Administered 2020-08-21: 3 mL via RESPIRATORY_TRACT
  Filled 2020-08-21: qty 3

## 2020-08-21 MED ORDER — METHYLPREDNISOLONE SODIUM SUCC 40 MG IJ SOLR
40.0000 mg | Freq: Two times a day (BID) | INTRAMUSCULAR | Status: DC
  Administered 2020-08-22 – 2020-08-23 (×3): 40 mg via INTRAVENOUS
  Filled 2020-08-21 (×3): qty 1

## 2020-08-21 MED ORDER — IPRATROPIUM-ALBUTEROL 20-100 MCG/ACT IN AERS
2.0000 | INHALATION_SPRAY | Freq: Four times a day (QID) | RESPIRATORY_TRACT | Status: DC
  Filled 2020-08-21: qty 4

## 2020-08-21 MED ORDER — PANTOPRAZOLE SODIUM 40 MG PO TBEC
40.0000 mg | DELAYED_RELEASE_TABLET | Freq: Every day | ORAL | Status: DC
  Administered 2020-08-22: 40 mg via ORAL
  Filled 2020-08-21 (×2): qty 1

## 2020-08-21 MED ORDER — ALBUTEROL SULFATE (2.5 MG/3ML) 0.083% IN NEBU: 2.5000 mg | INHALATION_SOLUTION | Freq: Once | RESPIRATORY_TRACT | Status: DC

## 2020-08-21 MED ORDER — ALBUTEROL SULFATE (2.5 MG/3ML) 0.083% IN NEBU
INHALATION_SOLUTION | RESPIRATORY_TRACT | Status: AC
  Administered 2020-08-21: 2.5 mg
  Filled 2020-08-21: qty 3

## 2020-08-21 MED ORDER — LEVALBUTEROL HCL 0.63 MG/3ML IN NEBU
0.6300 mg | INHALATION_SOLUTION | Freq: Four times a day (QID) | RESPIRATORY_TRACT | Status: DC
  Administered 2020-08-22 – 2020-08-24 (×11): 0.63 mg via RESPIRATORY_TRACT
  Filled 2020-08-21 (×12): qty 3

## 2020-08-21 MED ORDER — DM-GUAIFENESIN ER 30-600 MG PO TB12
1.0000 | ORAL_TABLET | Freq: Two times a day (BID) | ORAL | Status: DC
  Administered 2020-08-22: 1 via ORAL
  Filled 2020-08-21: qty 1

## 2020-08-21 MED ORDER — AZITHROMYCIN 250 MG PO TABS
250.0000 mg | ORAL_TABLET | Freq: Every day | ORAL | Status: DC
  Filled 2020-08-21: qty 1

## 2020-08-21 MED ORDER — LEVALBUTEROL HCL 0.63 MG/3ML IN NEBU: 0.6300 mg | INHALATION_SOLUTION | RESPIRATORY_TRACT | Status: DC | PRN

## 2020-08-21 MED ORDER — CEFTRIAXONE SODIUM 1 G IJ SOLR
1.0000 g | Freq: Once | INTRAMUSCULAR | Status: AC
  Administered 2020-08-21: 1 g via INTRAVENOUS
  Filled 2020-08-21: qty 10

## 2020-08-21 MED ORDER — AZITHROMYCIN 250 MG PO TABS
500.0000 mg | ORAL_TABLET | Freq: Every day | ORAL | Status: AC
  Administered 2020-08-22: 500 mg via ORAL
  Filled 2020-08-21: qty 2

## 2020-08-21 NOTE — H&P (Incomplete)
History and Physical  [name] Petersen WCH:[id number] DOB: 1927/02/19 DOA: 08/21/2020  Referring physician: Aron Baba PCP: Pcp, No  Patient coming from: [facility] assisted living facility  Chief Complaint: Shortness of breath and fever  HPI: [name] Petersen is a 87 y.o. female with medical history significant for hypertension, hyperlipidemia, history of stroke who presents to the emergency department via EMS from [facility]F due to shortness of breath.  Patient complained of increasing shortness of breath which started on Sunday (5/1), this was associated with a nonproductive cough and generalized body aches.  Some of the history was obtained from daughter at bedside, per daughter, patient had a fever of 100.65F at the living facility and Tylenol was given prior to arrival, patient was also reported to have had sick contact at the living facility.  Shortness of breath was seen with audible wheezing sound, breathing treatment was given at the living facility without improvement, so EMS was activated, on arrival of EMS team, 1 nebulizer treatment was also provided and patient was taken to the ED for further evaluation and management.  Patient states that she already had 2 COVID-19 vaccines and a booster.  ED Course:  In the emergency department, she was tachypneic and tachycardic.  Work-up in the ED showed normal CBC and BMP except for BUN/creatinine-21/1.19 (creatinine is within baseline range) and eGFR of 43.  Lactic acid was 1.1 > 1.3.  Influenza A, B and SARS coronavirus 2 was negative.   Chest x-ray showed no acute cardiopulmonary disease. She was provided with Solu-Medrol 125 mg x 1, DuoNeb and IV ceftriaxone.  IV hydration of 1 L NS was given.  Hospitalist was asked to admit patient for further evaluation and management.  Review of Systems: Constitutional: Negative for chills and fever.  HENT: Negative for ear pain and sore throat.   Eyes: Negative for pain and visual  disturbance.  Respiratory: Positive for cough,  wheezing and shortness of breath.   Cardiovascular: Negative for chest pain and palpitations.  Gastrointestinal: Negative for abdominal pain and vomiting.  Endocrine: Negative for polyphagia and polyuria.  Genitourinary: Negative for decreased urine volume, dysuria, enuresis Musculoskeletal: Negative for arthralgias and back pain.  Skin: Negative for color change and rash.  Allergic/Immunologic: Negative for immunocompromised state.  Neurological: Negative for tremors, syncope, speech difficulty, weakness, light-headedness and headaches.  Hematological: Does not bruise/bleed easily.  All other systems reviewed and are negative   Past Medical History:  Diagnosis Date  . Arthritis    hands  . Atherosclerotic PVD with ulceration ([place]) 07/15/2017   RIGHT HEEL  . Complication of anesthesia    lost eyesight for a few days after surgery in the 1950's  . Constipation   . Diabetes mellitus without complication (HCC)    no medications   . Hypertension   . Macular degeneration, bilateral    "one eye wet; one eye dry; she gets shots" (07/07/2017)  . PAD (peripheral artery disease) ([place])   . Pneumonia   . Stroke [facility])    some trouble swallowing   Past Surgical History:  Procedure Laterality Date  . ABDOMINAL AORTOGRAM W/LOWER EXTREMITY N/A 06/16/2017   Procedure: ABDOMINAL AORTOGRAM W/LOWER EXTREMITY;  Surgeon: Conrad [place] Bonito, MD;  Location: [place] CV [facility];  Service: Cardiovascular;  Laterality: N/A;  lt extermity  . ABDOMINAL HYSTERECTOMY    . AMPUTATION Left 07/06/2017   Procedure: AMPUTATION BELOW KNEE LEFT;  Surgeon: Rosetta Posner, MD;  Location: [facility];  Service: Vascular;  Laterality: Left;  . CATARACT EXTRACTION W/ INTRAOCULAR LENS  IMPLANT, BILATERAL    . DILATION AND CURETTAGE OF UTERUS    . LOWER EXTREMITY ANGIOGRAPHY N/A 07/14/2017   Procedure: LOWER EXTREMITY ANGIOGRAPHY;  Surgeon: Angelia Mould, MD;  Location: [place] CV LAB;  Service: Cardiovascular;  Laterality: N/A;    Social History:  reports that she has never smoked. She has never used smokeless tobacco. She reports that she does not drink alcohol and does not use drugs.   Allergies  Allergen Reactions  . Scopace [Scopolamine] Other (See Comments)    Hallucinations    Family History  Adopted: Yes    ***  Prior to Admission medications   Medication Sig Start Date End Date Taking? Authorizing Provider  acetaminophen (TYLENOL) 325 MG tablet Take 1-2 tablets (325-650 mg total) by mouth every 6 (six) hours as needed for mild pain. 07/29/17   Love, Ivan Anchors, PA-C  amLODipine (NORVASC) 10 MG tablet Take 1 tablet (10 mg total) by mouth daily. 07/29/17   Love, Ivan Anchors, PA-C  beta carotene w/minerals (OCUVITE) tablet Take 1 tablet by mouth daily.    [provider]  clopidogrel (PLAVIX) 75 MG tablet Take 75 mg by mouth daily. 04/03/14   [provider]  gabapentin (NEURONTIN) 300 MG capsule Take 1 capsule (300 mg total) by mouth 3 (three) times daily. Patient taking differently: Take 300 mg by mouth 2 (two) times daily.  08/12/17 09/11/17  Jamse Arn, MD  losartan-hydrochlorothiazide (HYZAAR) 50-12.5 MG tablet Take 1 tablet by mouth daily.    [provider]  LOVASTATIN PO Take 5 mg by mouth.    [provider]  polyethylene glycol (MIRALAX / GLYCOLAX) packet Take 17 g by mouth daily as needed. 07/29/17   Love, Ivan Anchors, PA-C  polyvinyl alcohol (LIQUIFILM TEARS) 1.4 % ophthalmic solution Place 1 drop into both eyes as needed for dry eyes. 07/29/17   Love, Ivan Anchors, PA-C  senna-docusate (SENOKOT-S) 8.6-50 MG tablet Take 2 tablets by mouth at bedtime. 07/29/17   Bary Leriche, PA-C    Physical Exam: BP (!) 151/67   Pulse 95   Temp 99.8 F (37.7 C)   Resp (!) 24   Ht $R'5\' 2"'fy$  (1.575 m)   Wt 71.2 kg   SpO2 94%   BMI 28.71 kg/m   . General: 87 y.o. year-old female well developed well nourished in no  acute distress.  Alert and oriented x3. Marland Kitchen HEENT: NCAT, EOMI . Neck: Supple, trachea medial . Cardiovascular: Regular rate and rhythm with no rubs or gallops.  No thyromegaly or JVD noted.  No lower extremity edema. 2/4 pulses in all 4 extremities. Marland Kitchen Respiratory: Diffuse audible expiratory wheezing on auscultation.  No rales.  . Abdomen: Soft nontender nondistended with normal bowel sounds x4 quadrants. . Muskuloskeletal: Left BKA.  No cyanosis or clubbing noted . Neuro: CN II-XII intact, sensation, reflexes intact . Skin: No ulcerative lesions noted or rashes . Psychiatry: Mood is appropriate for condition and setting          Labs on Admission:  Basic Metabolic Panel: Recent Labs  Lab 08/21/20 2059  NA 135  K 4.1  CL 99  CO2 26  GLUCOSE 118*  BUN 21  CREATININE 1.19*  CALCIUM 9.3   Liver Function Tests: Recent Labs  Lab 08/21/20 2059  AST 23  ALT 18  ALKPHOS 64  BILITOT 0.6  PROT 7.1  ALBUMIN 3.9   No results for  input(s): LIPASE, AMYLASE in the last 168 hours. No results for input(s): AMMONIA in the last 168 hours. CBC: Recent Labs  Lab 08/21/20 2059  WBC 6.3  NEUTROABS 3.9  HGB 14.9  HCT 46.2*  MCV 91.7  PLT 229   Cardiac Enzymes: No results for input(s): CKTOTAL, CKMB, CKMBINDEX, TROPONINI in the last 168 hours.  BNP (last 3 results) No results for input(s): BNP in the last 8760 hours.  ProBNP (last 3 results) No results for input(s): PROBNP in the last 8760 hours.  CBG: No results for input(s): GLUCAP in the last 168 hours.  Radiological Exams on Admission: DG Chest Port 1 View  Result Date: 08/21/2020 CLINICAL DATA:  Cough and shortness of breath. EXAM: PORTABLE CHEST 1 VIEW COMPARISON:  April 18, 2007 FINDINGS: Chronic appearing increased interstitial lung markings are seen. There is no evidence of acute infiltrate, pleural effusion or pneumothorax. The heart size and mediastinal contours are within normal limits. Moderate to marked severity  calcification of the aortic arch is noted. The visualized skeletal structures are unremarkable. IMPRESSION: No acute cardiopulmonary disease. Electronically Signed   By: Virgina Norfolk M.D.   On: 08/21/2020 21:28    EKG: I independently viewed the EKG done and my findings are as followed: No EKG was done in the ED  Assessment/Plan Present on Admission: . Acute bronchitis . PAD (peripheral artery disease) ([place]) . Essential hypertension . Stage 3 chronic kidney disease (HCC)  Principal Problem:   Acute bronchitis Active Problems:   PAD (peripheral artery disease) (HCC)   S/P below knee amputation, left (HCC)   Essential hypertension   Stage 3 chronic kidney disease (HCC)   History of stroke   Hyperlipidemia  Shortness of breath possibly secondary to acute bronchitis Breathing treatment provided at the ALF and with EMS without relief Continue Xopenex, Mucinex, Solu-Medrol, azithromycin. Continue Protonix to prevent steroid-induced ulcer Continue incentive spirometry and flutter valve  Essential hypertension Continue Norvasc  History of stroke/history of PAD Continue Plavix and Pravachol  Hyperlipidemia Continue Pravachol   CKD stage IIIb BUN/creatinine-21/1.19 (creatinine is within baseline range)  Renally adjust medications, avoid nephrotoxic agents/dehydration/hypotension  S/p left below-knee amputation Stable, continue PT/OT eval and treat   DVT prophylaxis: Lovenox  Code Status: Full code  Family Communication: Daughter at bedside (all questions answered to satisfaction)  Disposition Plan:  Patient is from:                        home Anticipated DC to:                   home Anticipated DC date:               1 day Anticipated DC barriers:           Patient requires inpatient management for acute bronchitis with diffuse audible wheezing requiring inpatient management   Consults called: None  Admission status: Observation    Bernadette Hoit MD [facility]  08/21/2020, 11:45 PM

## 2020-08-21 NOTE — ED Triage Notes (Signed)
BIB EMS c/o shortness of breathe and fever since Saturday. 1 neb given by Chip Boer, 1 given by EMS PTA. Last tylenol at 1300. Audible wheezes present on arrival. 3 rapid covid tests negative.

## 2020-08-21 NOTE — H&P (Signed)
History and Physical  [name] Petersen QLV:[id number] DOB: 1926-06-28 DOA: 08/21/2020  Referring physician: Barnie Del PCP: Pcp, No  Patient coming from: [facility] assisted living facility  Chief Complaint: Shortness of breath and fever  HPI: [name] Petersen is a 87 y.o. female with medical history significant for hypertension, hyperlipidemia, history of stroke who presents to the emergency department via EMS from [facility] due to shortness of breath.  Patient complained of increasing shortness of breath which started on Sunday (5/1), this was associated with a nonproductive cough and generalized body aches.  Some of the history was obtained from daughter at bedside, per daughter, patient had a fever of 100.28F at the living facility and Tylenol was given prior to arrival, patient was also reported to have had sick contact at the living facility.  Shortness of breath was seen with audible wheezing sound, breathing treatment was given at the living facility without improvement, so EMS was activated, on arrival of EMS team, 1 nebulizer treatment was also provided and patient was taken to the ED for further evaluation and management.  Patient states that she already had 2 COVID-19 vaccines and a booster.  ED Course:  In the emergency department, she was tachypneic and tachycardic.  Work-up in the ED showed normal CBC and BMP except for BUN/creatinine-21/1.19 (creatinine is within baseline range) and eGFR of 43.  Lactic acid was 1.1 > 1.3.  Influenza A, B and SARS coronavirus 2 was negative.   Chest x-ray showed no acute cardiopulmonary disease. She was treated with Solu-Medrol 125 mg x 1, DuoNeb and IV ceftriaxone.  IV hydration of 1 L NS was given.  Hospitalist was asked to admit patient for further evaluation and management.  Review of Systems: Constitutional: Negative for chills and fever.  HENT: Negative for ear pain and sore throat.   Eyes: Negative for pain and visual  disturbance.  Respiratory: Positive for cough,  wheezing and shortness of breath.   Cardiovascular: Negative for chest pain and palpitations.  Gastrointestinal: Negative for abdominal pain and vomiting.  Endocrine: Negative for polyphagia and polyuria.  Genitourinary: Negative for decreased urine volume, dysuria, enuresis Musculoskeletal: Negative for arthralgias and back pain.  Skin: Negative for color change and rash.  Allergic/Immunologic: Negative for immunocompromised state.  Neurological: Negative for tremors, syncope, speech difficulty, weakness, light-headedness and headaches.  Hematological: Does not bruise/bleed easily.  All other systems reviewed and are negative   Past Medical History:  Diagnosis Date  . Arthritis    hands  . Atherosclerotic PVD with ulceration (HCC) 07/15/2017   RIGHT HEEL  . Complication of anesthesia    lost eyesight for a few days after surgery in the 1950's  . Constipation   . Diabetes mellitus without complication (HCC)    no medications   . Hypertension   . Macular degeneration, bilateral    "one eye wet; one eye dry; she gets shots" (07/07/2017)  . PAD (peripheral artery disease) (HCC)   . Pneumonia   . Stroke [facility])    some trouble swallowing   Past Surgical History:  Procedure Laterality Date  . ABDOMINAL AORTOGRAM W/LOWER EXTREMITY N/A 06/16/2017   Procedure: ABDOMINAL AORTOGRAM W/LOWER EXTREMITY;  Surgeon: Fransisco Hertz, MD;  Location: [facility] INVASIVE CV LAB;  Service: Cardiovascular;  Laterality: N/A;  lt extermity  . ABDOMINAL HYSTERECTOMY    . AMPUTATION Left 07/06/2017   Procedure: AMPUTATION BELOW KNEE LEFT;  Surgeon: Larina Earthly, MD;  Location: [facility] OR;  Service: Vascular;  Laterality: Left;  . CATARACT EXTRACTION W/ INTRAOCULAR LENS  IMPLANT, BILATERAL    . DILATION AND CURETTAGE OF UTERUS    . LOWER EXTREMITY ANGIOGRAPHY N/A 07/14/2017   Procedure: LOWER EXTREMITY ANGIOGRAPHY;  Surgeon: Angelia Mould, MD;  Location: [place] CV LAB;  Service: Cardiovascular;  Laterality: N/A;    Social History:  reports that she has never smoked. She has never used smokeless tobacco. She reports that she does not drink alcohol and does not use drugs.   Allergies  Allergen Reactions  . Scopace [Scopolamine] Other (See Comments)    Hallucinations    Family History  Adopted: Yes     Prior to Admission medications   Medication Sig Start Date End Date Taking? Authorizing Provider  acetaminophen (TYLENOL) 325 MG tablet Take 1-2 tablets (325-650 mg total) by mouth every 6 (six) hours as needed for mild pain. 07/29/17   Love, Ivan Anchors, PA-C  amLODipine (NORVASC) 10 MG tablet Take 1 tablet (10 mg total) by mouth daily. 07/29/17   Love, Ivan Anchors, PA-C  beta carotene w/minerals (OCUVITE) tablet Take 1 tablet by mouth daily.    [provider]  clopidogrel (PLAVIX) 75 MG tablet Take 75 mg by mouth daily. 04/03/14   [provider]  gabapentin (NEURONTIN) 300 MG capsule Take 1 capsule (300 mg total) by mouth 3 (three) times daily. Patient taking differently: Take 300 mg by mouth 2 (two) times daily.  08/12/17 09/11/17  Jamse Arn, MD  losartan-hydrochlorothiazide (HYZAAR) 50-12.5 MG tablet Take 1 tablet by mouth daily.    [provider]  LOVASTATIN PO Take 5 mg by mouth.    [provider]  polyethylene glycol (MIRALAX / GLYCOLAX) packet Take 17 g by mouth daily as needed. 07/29/17   Love, Ivan Anchors, PA-C  polyvinyl alcohol (LIQUIFILM TEARS) 1.4 % ophthalmic solution Place 1 drop into both eyes as needed for dry eyes. 07/29/17   Love, Ivan Anchors, PA-C  senna-docusate (SENOKOT-S) 8.6-50 MG tablet Take 2 tablets by mouth at bedtime. 07/29/17   Bary Leriche, PA-C    Physical Exam: BP (!) 151/67   Pulse 95   Temp 99.8 F (37.7 C)   Resp (!) 24   Ht $R'5\' 2"'pf$  (1.575 m)   Wt 71.2 kg   SpO2 94%   BMI 28.71 kg/m   . General: 87 y.o. year-old female well developed well nourished in no acute  distress.  Alert and oriented x3. Marland Kitchen HEENT: NCAT, EOMI . Neck: Supple, trachea medial . Cardiovascular: Regular rate and rhythm with no rubs or gallops.  No thyromegaly or JVD noted.  No lower extremity edema. 2/4 pulses in all 4 extremities. Marland Kitchen Respiratory: Diffuse audible expiratory wheezing on auscultation.  No rales.  . Abdomen: Soft nontender nondistended with normal bowel sounds x4 quadrants. . Muskuloskeletal: Left BKA.  No cyanosis or clubbing noted . Neuro: CN II-XII intact, sensation, reflexes intact . Skin: No ulcerative lesions noted or rashes . Psychiatry: Mood is appropriate for condition and setting          Labs on Admission:  Basic Metabolic Panel: Recent Labs  Lab 08/21/20 2059  NA 135  K 4.1  CL 99  CO2 26  GLUCOSE 118*  BUN 21  CREATININE 1.19*  CALCIUM 9.3   Liver Function Tests: Recent Labs  Lab 08/21/20 2059  AST 23  ALT 18  ALKPHOS 64  BILITOT 0.6  PROT 7.1  ALBUMIN 3.9   No results for input(s):  LIPASE, AMYLASE in the last 168 hours. No results for input(s): AMMONIA in the last 168 hours. CBC: Recent Labs  Lab 08/21/20 2059  WBC 6.3  NEUTROABS 3.9  HGB 14.9  HCT 46.2*  MCV 91.7  PLT 229   Cardiac Enzymes: No results for input(s): CKTOTAL, CKMB, CKMBINDEX, TROPONINI in the last 168 hours.  BNP (last 3 results) No results for input(s): BNP in the last 8760 hours.  ProBNP (last 3 results) No results for input(s): PROBNP in the last 8760 hours.  CBG: No results for input(s): GLUCAP in the last 168 hours.  Radiological Exams on Admission: DG Chest Port 1 View  Result Date: 08/21/2020 CLINICAL DATA:  Cough and shortness of breath. EXAM: PORTABLE CHEST 1 VIEW COMPARISON:  April 18, 2007 FINDINGS: Chronic appearing increased interstitial lung markings are seen. There is no evidence of acute infiltrate, pleural effusion or pneumothorax. The heart size and mediastinal contours are within normal limits. Moderate to marked severity  calcification of the aortic arch is noted. The visualized skeletal structures are unremarkable. IMPRESSION: No acute cardiopulmonary disease. Electronically Signed   By: Virgina Norfolk M.D.   On: 08/21/2020 21:28    EKG: I independently viewed the EKG done and my findings are as followed: No EKG was done in the ED  Assessment/Plan Present on Admission: . Acute bronchitis . PAD (peripheral artery disease) ([place]) . Essential hypertension . Stage 3 chronic kidney disease (HCC)  Principal Problem:   Acute bronchitis Active Problems:   PAD (peripheral artery disease) (HCC)   S/P below knee amputation, left (HCC)   Essential hypertension   Stage 3 chronic kidney disease (HCC)   History of stroke   Hyperlipidemia  Shortness of breath possibly secondary to acute bronchitis Breathing treatment provided at the ALF and with EMS without relief Continue Xopenex, Mucinex, Solu-Medrol, azithromycin. Continue Protonix to prevent steroid-induced ulcer Continue incentive spirometry and flutter valve  Essential hypertension Continue Norvasc  History of stroke/history of PAD Continue Plavix and Pravachol  Hyperlipidemia Continue Pravachol   CKD stage IIIb BUN/creatinine-21/1.19 (creatinine is within baseline range)  Renally adjust medications, avoid nephrotoxic agents/dehydration/hypotension  S/p left below-knee amputation Stable, continue PT/OT eval and treat   DVT prophylaxis: Lovenox  Code Status: Full code  Family Communication: Daughter at bedside (all questions answered to satisfaction)  Disposition Plan:  Patient is from:                        home Anticipated DC to:                   home Anticipated DC date:               1 day Anticipated DC barriers:           Patient requires inpatient management for acute bronchitis with diffuse audible wheezing requiring inpatient management   Consults called: None  Admission status: Observation    Bernadette Hoit MD [facility]  08/21/2020, 11:45 PM

## 2020-08-21 NOTE — ED Provider Notes (Signed)
[facility] EMERGENCY DEPARTMENT Provider Note   CSN: [id number] Arrival date & time: 08/21/20  1954     History Chief Complaint  Patient presents with  . Fever    [name] Petersen is a 87 y.o. female.  HPI   Patient with significant medical history of arthritis, diabetes, hypertension, PAD, stroke presents to the emergency department from assisted living [place] due to shortness of breath.  Patient endorses since last week she has been having fevers, chills, nasal congestion and a nonproductive cough and general body aches.  She states she has been coughing a lot specially at nighttime, she is unable to get anything out, the coughing is irritating her ribs. she denies shortness of breath, chest pain, orthopnea worsening pedal edema, she states that she is feeling.  She has no significant respiratory diseases, denies history of smoking, denies recent sick contacts, is not immunocompromise.  Patient denies abdominal pain, nausea, vomiting, constipation, diarrhea, is tolerating p.o. without difficulty.  Patient denies alleviating factors.  Patient came via EMS she received 1 neb treatment at [facility] and then received another by EMS as well as Tylenol but continues to have wheezing.  She had 3 negative COVID test.  Past Medical History:  Diagnosis Date  . Arthritis    hands  . Atherosclerotic PVD with ulceration (HCC) 07/15/2017   RIGHT HEEL  . Complication of anesthesia    lost eyesight for a few days after surgery in the 1950's  . Constipation   . Diabetes mellitus without complication (HCC)    no medications   . Hypertension   . Macular degeneration, bilateral    "one eye wet; one eye dry; she gets shots" (07/07/2017)  . PAD (peripheral artery disease) (HCC)   . Pneumonia   . Stroke [facility])    some trouble swallowing    Patient Active Problem List   Diagnosis Date Noted  . Acute bronchitis 08/21/2020  . Urinary retention   . Phantom limb pain (HCC)   . Unilateral  complete BKA, left, sequela (HCC)   . Slow transit constipation   . Acute lower UTI   . Atherosclerotic PVD with ulceration (HCC) 07/14/2017  . Pressure injury of right heel, stage 3 (HCC)   . Sleep disturbance   . Poor nutrition   . Hypokalemia   . Drug-induced constipation   . Essential hypertension   . Stage 3 chronic kidney disease (HCC)   . Postoperative pain   . Diabetes mellitus type 2 in obese (HCC)   . S/P below knee amputation, left (HCC) 07/10/2017  . PAD (peripheral artery disease) (HCC) 07/06/2017  . Atherosclerosis of native arteries of the extremities with ulceration (HCC) 06/16/2017    Past Surgical History:  Procedure Laterality Date  . ABDOMINAL AORTOGRAM W/LOWER EXTREMITY N/A 06/16/2017   Procedure: ABDOMINAL AORTOGRAM W/LOWER EXTREMITY;  Surgeon: Fransisco Hertz, MD;  Location: [facility] INVASIVE CV LAB;  Service: Cardiovascular;  Laterality: N/A;  lt extermity  . ABDOMINAL HYSTERECTOMY    . AMPUTATION Left 07/06/2017   Procedure: AMPUTATION BELOW KNEE LEFT;  Surgeon: Larina Earthly, MD;  Location: [facility] OR;  Service: Vascular;  Laterality: Left;  . CATARACT EXTRACTION W/ INTRAOCULAR LENS  IMPLANT, BILATERAL    . DILATION AND CURETTAGE OF UTERUS    . LOWER EXTREMITY ANGIOGRAPHY N/A 07/14/2017   Procedure: LOWER EXTREMITY ANGIOGRAPHY;  Surgeon: Chuck Hint, MD;  Location: [facility] INVASIVE CV LAB;  Service: Cardiovascular;  Laterality: N/A;     OB History  Gravida  3   Para      Term      Preterm      AB  0   Living  3     SAB      IAB      Ectopic  0   Multiple      Live Births              Family History  Adopted: Yes    Social History   Tobacco Use  . Smoking status: Never Smoker  . Smokeless tobacco: Never Used  Vaping Use  . Vaping Use: Never used  Substance Use Topics  . Alcohol use: Never  . Drug use: Never    Home Medications Prior to Admission medications   Medication Sig Start Date End Date Taking? Authorizing Provider   acetaminophen (TYLENOL) 325 MG tablet Take 1-2 tablets (325-650 mg total) by mouth every 6 (six) hours as needed for mild pain. 07/29/17   Love, Evlyn KannerPamela S, PA-C  amLODipine (NORVASC) 10 MG tablet Take 1 tablet (10 mg total) by mouth daily. 07/29/17   Love, Evlyn KannerPamela S, PA-C  beta carotene w/minerals (OCUVITE) tablet Take 1 tablet by mouth daily.    [provider]  clopidogrel (PLAVIX) 75 MG tablet Take 75 mg by mouth daily. 04/03/14   [provider]  gabapentin (NEURONTIN) 300 MG capsule Take 1 capsule (300 mg total) by mouth 3 (three) times daily. Patient taking differently: Take 300 mg by mouth 2 (two) times daily.  08/12/17 09/11/17  Marcello FennelPatel, Ankit Anil, MD  losartan-hydrochlorothiazide (HYZAAR) 50-12.5 MG tablet Take 1 tablet by mouth daily.    [provider]  LOVASTATIN PO Take 5 mg by mouth.    [provider]  polyethylene glycol (MIRALAX / GLYCOLAX) packet Take 17 g by mouth daily as needed. 07/29/17   Love, Evlyn KannerPamela S, PA-C  polyvinyl alcohol (LIQUIFILM TEARS) 1.4 % ophthalmic solution Place 1 drop into both eyes as needed for dry eyes. 07/29/17   Love, Evlyn KannerPamela S, PA-C  senna-docusate (SENOKOT-S) 8.6-50 MG tablet Take 2 tablets by mouth at bedtime. 07/29/17   Love, Evlyn KannerPamela S, PA-C    Allergies    Scopace [scopolamine]  Review of Systems   Review of Systems  Constitutional: Positive for chills and fever.  HENT: Positive for congestion. Negative for sore throat.   Respiratory: Positive for cough. Negative for shortness of breath.   Cardiovascular: Negative for chest pain.  Gastrointestinal: Negative for abdominal pain, diarrhea, nausea and vomiting.  Genitourinary: Negative for dysuria and enuresis.  Musculoskeletal: Negative for back pain.  Skin: Negative for rash.  Neurological: Positive for headaches. Negative for dizziness.  Hematological: Does not bruise/bleed easily.    Physical Exam Updated Vital Signs BP (!) 151/67   Pulse 95   Temp 99.8 F  (37.7 C)   Resp (!) 24   Ht 5\' 2"  (1.575 m)   Wt 71.2 kg   SpO2 94%   BMI 28.71 kg/m   Physical Exam Vitals and nursing note reviewed.  Constitutional:      General: She is not in acute distress.    Appearance: She is ill-appearing.     Comments: Ill-appearing, deconditioned state, left leg amputee below the knee  HENT:     Head: Normocephalic and atraumatic.     Nose: Congestion present.     Mouth/Throat:     Mouth: Mucous membranes are moist.     Pharynx: Oropharynx is clear. No oropharyngeal exudate or  posterior oropharyngeal erythema.  Eyes:     Conjunctiva/sclera: Conjunctivae normal.  Cardiovascular:     Rate and Rhythm: Regular rhythm. Tachycardia present.     Pulses: Normal pulses.     Heart sounds: No murmur heard. No friction rub. No gallop.   Pulmonary:     Effort: No respiratory distress.     Breath sounds: Wheezing and rhonchi present. No rales.  Abdominal:     Palpations: Abdomen is soft.     Tenderness: There is no abdominal tenderness. There is no right CVA tenderness or left CVA tenderness.  Musculoskeletal:     Right lower leg: No edema.  Skin:    General: Skin is warm and dry.  Neurological:     Mental Status: She is alert.  Psychiatric:        Mood and Affect: Mood normal.     ED Results / Procedures / Treatments   Labs (all labs ordered are listed, but only abnormal results are displayed) Labs Reviewed  COMPREHENSIVE METABOLIC PANEL - Abnormal; Notable for the following components:      Result Value   Glucose, Bld 118 (*)    Creatinine, Ser 1.19 (*)    GFR, Estimated 43 (*)    All other components within normal limits  CBC WITH DIFFERENTIAL/PLATELET - Abnormal; Notable for the following components:   HCT 46.2 (*)    All other components within normal limits  RESP PANEL BY RT-PCR (FLU A&B, COVID) ARPGX2  CULTURE, BLOOD (ROUTINE X 2)  CULTURE, BLOOD (ROUTINE X 2)  LACTIC ACID, PLASMA  LACTIC ACID, PLASMA  URINALYSIS, ROUTINE W REFLEX  MICROSCOPIC    EKG None  Radiology DG Chest Port 1 View  Result Date: 08/21/2020 CLINICAL DATA:  Cough and shortness of breath. EXAM: PORTABLE CHEST 1 VIEW COMPARISON:  April 18, 2007 FINDINGS: Chronic appearing increased interstitial lung markings are seen. There is no evidence of acute infiltrate, pleural effusion or pneumothorax. The heart size and mediastinal contours are within normal limits. Moderate to marked severity calcification of the aortic arch is noted. The visualized skeletal structures are unremarkable. IMPRESSION: No acute cardiopulmonary disease. Electronically Signed   By: Aram Candela M.D.   On: 08/21/2020 21:28    Procedures Procedures   Medications Ordered in ED Medications  sodium chloride 0.9 % bolus 1,000 mL (0 mLs Intravenous Stopped 08/21/20 2219)  methylPREDNISolone sodium succinate (SOLU-MEDROL) 40 mg/mL injection 125 mg (125 mg Intravenous Given 08/21/20 2056)  cefTRIAXone (ROCEPHIN) 1 g in sodium chloride 0.9 % 100 mL IVPB (0 g Intravenous Stopped 08/21/20 2219)  ipratropium-albuterol (DUONEB) 0.5-2.5 (3) MG/3ML nebulizer solution 3 mL (3 mLs Nebulization Given 08/21/20 2137)  albuterol (PROVENTIL) (2.5 MG/3ML) 0.083% nebulizer solution (2.5 mg  Given 08/21/20 2137)    ED Course  I have reviewed the triage vital signs and the nursing notes.  Pertinent labs & imaging results that were available during my care of the patient were reviewed by me and considered in my medical decision making (see chart for details).    MDM Rules/Calculators/A&P                         Initial impression-patient presents with coughing and wheezing.  She is alert, vital signs notable for tachycardia and tachypnea.  Concern for COPD exacerbation will obtain basic lab work-up, chest x-ray, EKG, provide patient with fluids, steroids and DuoNeb and reassess  Work-up-CBC unremarkable, CMP shows hyperglycemia of 118, creatinine 1.19 appears a baseline  for patient, lactic 1.1,  respiratory panel negative  Reassessment patient was reassessed after DuoNeb, she continued to have wheezing but they have improved, she is not in respiratory distress, she is resting comfortably in bed.  Recommend the patient should be admitted to the hospital for further evaluation, patient agreeable to this plan.  Will consult with hospitalist for admission  Consult spoke with Dr. Thomes Dinning of the hospitalist team, he has accept the patient, Evaluate.  Rule out-low suspicion for systemic infection as patient is nontoxic-appearing, vital signs reassuring, no obvious source infection on my exam.  Low suspicion for CHF exacerbation as there is no signs of fluid overload on my exam, chest x-ray negative for pulmonary edema.   Low Suspicion for PE as patient has low risk factors, presentation more consistent with a bacterial or viral infection.  Low suspicion for ACS as patient denies chest pain, shortness of breath, EKG sinus rhythm without signs of ischemia.    Plan-admit to medicine for suspected acute bronchitis.  Patient care to be transferred over to admitting team.   Final Clinical Impression(s) / ED Diagnoses Final diagnoses:  Acute bronchitis, unspecified organism    Rx / DC Orders ED Discharge Orders    None       Carroll Sage, PA-C 08/21/20 2305    Pollyann Savoy, MD 08/21/20 2328

## 2020-08-22 ENCOUNTER — Other Ambulatory Visit: Payer: Self-pay

## 2020-08-22 DIAGNOSIS — E782 Mixed hyperlipidemia: Secondary | ICD-10-CM | POA: Diagnosis not present

## 2020-08-22 DIAGNOSIS — E872 Acidosis: Secondary | ICD-10-CM

## 2020-08-22 DIAGNOSIS — Z8673 Personal history of transient ischemic attack (TIA), and cerebral infarction without residual deficits: Secondary | ICD-10-CM | POA: Diagnosis not present

## 2020-08-22 DIAGNOSIS — J209 Acute bronchitis, unspecified: Secondary | ICD-10-CM | POA: Diagnosis not present

## 2020-08-22 DIAGNOSIS — I10 Essential (primary) hypertension: Secondary | ICD-10-CM | POA: Diagnosis not present

## 2020-08-22 LAB — CBC
HCT: 45.2 % (ref 36.0–46.0)
Hemoglobin: 14.8 g/dL (ref 12.0–15.0)
MCH: 29.8 pg (ref 26.0–34.0)
MCHC: 32.7 g/dL (ref 30.0–36.0)
MCV: 90.9 fL (ref 80.0–100.0)
Platelets: 213 10*3/uL (ref 150–400)
RBC: 4.97 MIL/uL (ref 3.87–5.11)
RDW: 13.7 % (ref 11.5–15.5)
WBC: 5 10*3/uL (ref 4.0–10.5)
nRBC: 0 % (ref 0.0–0.2)

## 2020-08-22 LAB — URINALYSIS, ROUTINE W REFLEX MICROSCOPIC
Bilirubin Urine: NEGATIVE
Glucose, UA: NEGATIVE mg/dL
Hgb urine dipstick: NEGATIVE
Ketones, ur: 5 mg/dL — AB
Leukocytes,Ua: NEGATIVE
Nitrite: NEGATIVE
Protein, ur: NEGATIVE mg/dL
Specific Gravity, Urine: 1.011 (ref 1.005–1.030)
pH: 5 (ref 5.0–8.0)

## 2020-08-22 LAB — COMPREHENSIVE METABOLIC PANEL
ALT: 17 U/L (ref 0–44)
AST: 29 U/L (ref 15–41)
Albumin: 3.5 g/dL (ref 3.5–5.0)
Alkaline Phosphatase: 58 U/L (ref 38–126)
Anion gap: 13 (ref 5–15)
BUN: 20 mg/dL (ref 8–23)
CO2: 18 mmol/L — ABNORMAL LOW (ref 22–32)
Calcium: 8.9 mg/dL (ref 8.9–10.3)
Chloride: 105 mmol/L (ref 98–111)
Creatinine, Ser: 1.26 mg/dL — ABNORMAL HIGH (ref 0.44–1.00)
GFR, Estimated: 40 mL/min — ABNORMAL LOW (ref >60)
Glucose, Bld: 210 mg/dL — ABNORMAL HIGH (ref 70–99)
Potassium: 4.3 mmol/L (ref 3.5–5.1)
Sodium: 136 mmol/L (ref 135–145)
Total Bilirubin: 1.2 mg/dL (ref 0.3–1.2)
Total Protein: 6.5 g/dL (ref 6.5–8.1)

## 2020-08-22 LAB — PHOSPHORUS: Phosphorus: 2.3 mg/dL — ABNORMAL LOW (ref 2.5–4.6)

## 2020-08-22 LAB — APTT: aPTT: 29 seconds (ref 24–36)

## 2020-08-22 LAB — PROTIME-INR
INR: 1 (ref 0.8–1.2)
Prothrombin Time: 13.3 seconds (ref 11.4–15.2)

## 2020-08-22 LAB — MAGNESIUM: Magnesium: 1.9 mg/dL (ref 1.7–2.4)

## 2020-08-22 MED ORDER — IPRATROPIUM BROMIDE 0.02 % IN SOLN
0.5000 mg | Freq: Four times a day (QID) | RESPIRATORY_TRACT | Status: DC
  Administered 2020-08-22 – 2020-08-24 (×8): 0.5 mg via RESPIRATORY_TRACT
  Filled 2020-08-22 (×9): qty 2.5

## 2020-08-22 MED ORDER — ENOXAPARIN SODIUM 30 MG/0.3ML IJ SOSY
30.0000 mg | PREFILLED_SYRINGE | INTRAMUSCULAR | Status: DC
  Administered 2020-08-22 – 2020-08-28 (×6): 30 mg via SUBCUTANEOUS
  Filled 2020-08-22 (×7): qty 0.3

## 2020-08-22 MED ORDER — SODIUM CHLORIDE 0.9 % IV SOLN: INTRAVENOUS | Status: AC

## 2020-08-22 MED ORDER — PRAVASTATIN SODIUM 10 MG PO TABS
10.0000 mg | ORAL_TABLET | Freq: Every day | ORAL | Status: DC
  Administered 2020-08-22 – 2020-08-27 (×6): 10 mg via ORAL
  Filled 2020-08-22 (×6): qty 1

## 2020-08-22 MED ORDER — AMLODIPINE BESYLATE 5 MG PO TABS
10.0000 mg | ORAL_TABLET | Freq: Every day | ORAL | Status: DC
  Administered 2020-08-22 – 2020-08-28 (×7): 10 mg via ORAL
  Filled 2020-08-22 (×7): qty 2

## 2020-08-22 MED ORDER — K PHOS MONO-SOD PHOS DI & MONO 155-852-130 MG PO TABS
500.0000 mg | ORAL_TABLET | Freq: Once | ORAL | Status: AC
  Administered 2020-08-22: 500 mg via ORAL
  Filled 2020-08-22: qty 2

## 2020-08-22 MED ORDER — GUAIFENESIN ER 600 MG PO TB12
1200.0000 mg | ORAL_TABLET | Freq: Two times a day (BID) | ORAL | Status: DC
  Administered 2020-08-22 – 2020-08-28 (×12): 1200 mg via ORAL
  Filled 2020-08-22 (×13): qty 2

## 2020-08-22 MED ORDER — ZOLPIDEM TARTRATE 5 MG PO TABS
5.0000 mg | ORAL_TABLET | Freq: Once | ORAL | Status: AC
  Administered 2020-08-22: 5 mg via ORAL
  Filled 2020-08-22: qty 1

## 2020-08-22 MED ORDER — CLOPIDOGREL BISULFATE 75 MG PO TABS
75.0000 mg | ORAL_TABLET | Freq: Every day | ORAL | Status: DC
  Administered 2020-08-22 – 2020-08-28 (×7): 75 mg via ORAL
  Filled 2020-08-22 (×7): qty 1

## 2020-08-22 NOTE — Care Management Obs Status (Signed)
MEDICARE OBSERVATION STATUS NOTIFICATION   Patient Details  Name: [name] Petersen MRN: [id number] Date of Birth: 11/07/1926   Medicare Observation Status Notification Given:  Yes    Villa Herb, Theresia Majors 08/22/2020, 4:39 PM

## 2020-08-22 NOTE — Progress Notes (Signed)
Per primary RN patient has sundowners and attempts to exit bed during night. For safety purposes family member allowed to stay to calm patient and keep in bed to defer falls. Family educated to remain in room and wear mask whenever staff is in room.

## 2020-08-22 NOTE — Evaluation (Signed)
Physical Therapy Evaluation Patient Details Name: [name] Petersen MRN: [id number] DOB: 03/23/1927 Today's Date: 08/22/2020   History of Present Illness  [name] Petersen is a 87 y.o. female with medical history significant for hypertension, hyperlipidemia, history of stroke who presents to the emergency department via EMS from [facility] due to shortness of breath.  Patient complained of increasing shortness of breath which started on Sunday (5/1), this was associated with a nonproductive cough and generalized body aches.  Some of the history was obtained from daughter at bedside, per daughter, patient had a fever of 100.12F at the living facility and Tylenol was given prior to arrival, patient was also reported to have had sick contact at the living facility.  Shortness of breath was seen with audible wheezing sound, breathing treatment was given at the living facility without improvement, so EMS was activated, on arrival of EMS team, 1 nebulizer treatment was also provided and patient was taken to the ED for further evaluation and management.  Patient states that she already had 2 COVID-19 vaccines and a booster.    Clinical Impression  Patient functioning near baseline for functional mobility demonstrating fair/good return for completing stand pivot transfer to wheelchair without loss of balance using armrest of chair.  Patient has been non-ambulatory since left BKA due to prosthetic leg not fitting.  Patient tolerated sitting up in chair after therapy with her daughter present in room - RN aware.  Patient will benefit from continued physical therapy in hospital and recommended venue below to increase strength, balance, endurance for safe ADLs and gait.      Follow Up Recommendations Home health PT;Supervision for mobility/OOB;Supervision - Intermittent    Equipment Recommendations  None recommended by PT    Recommendations for Other Services       Precautions / Restrictions  Precautions Precautions: Fall Precaution Comments: Left BKA Restrictions Weight Bearing Restrictions: No      Mobility  Bed Mobility Overal bed mobility: Needs Assistance Bed Mobility: Supine to Sit     Supine to sit: Min assist     General bed mobility comments: increased time, labored movement    Transfers Overall transfer level: Needs assistance Equipment used: 1 person hand held assist Transfers: Sit to/from [employer] Transfers Sit to Stand: Min guard;Min assist Stand pivot transfers: Min guard;Min assist       General transfer comment: had to use armrest of wheelchiar for stand pivot transfer  Ambulation/Gait                Stairs            Wheelchair Mobility    Modified Rankin (Stroke Patients Only)       Balance Overall balance assessment: Needs assistance Sitting-balance support: Feet unsupported;No upper extremity supported Sitting balance-Leahy Scale: Good Sitting balance - Comments: seated at EOB   Standing balance support: During functional activity;Single extremity supported Standing balance-Leahy Scale: Poor Standing balance comment: fair/poor using armrest of wheelchair                             Pertinent Vitals/Pain Pain Assessment: No/denies pain    Home Living Family/patient expects to be discharged to:: Assisted living               Home Equipment: Wheelchair - manual      Prior Function Level of Independence: Needs assistance   Gait / Transfers Assistance Needed: assisted stand pivot transfers, non-ambulatory  ADL's / Homemaking Assistance Needed: assisted by ALF staff        Hand Dominance        Extremity/Trunk Assessment   Upper Extremity Assessment Upper Extremity Assessment: Overall WFL for tasks assessed    Lower Extremity Assessment Lower Extremity Assessment: Generalized weakness    Cervical / Trunk Assessment Cervical / Trunk Assessment: Normal  Communication    Communication: No difficulties  Cognition Arousal/Alertness: Awake/alert Behavior During Therapy: WFL for tasks assessed/performed Overall Cognitive Status: Within Functional Limits for tasks assessed                                        General Comments      Exercises     Assessment/Plan    PT Assessment Patient needs continued PT services  PT Problem List Decreased strength;Decreased activity tolerance;Decreased balance;Decreased mobility       PT Treatment Interventions DME instruction;Functional mobility training;Therapeutic activities;Therapeutic exercise;Balance training;Patient/family education;Wheelchair mobility training    PT Goals (Current goals can be found in the Care Plan section)  Acute Rehab PT Goals Patient Stated Goal: return home PT Goal Formulation: With patient/family Time For Goal Achievement: 08/25/20 Potential to Achieve Goals: Good    Frequency Min 3X/week   Barriers to discharge        Co-evaluation               AM-PAC PT "6 Clicks" Mobility  Outcome Measure Help needed turning from your back to your side while in a flat bed without using bedrails?: A Little Help needed moving from lying on your back to sitting on the side of a flat bed without using bedrails?: A Little Help needed moving to and from a bed to a chair (including a wheelchair)?: A Little Help needed standing up from a chair using your arms (e.g., wheelchair or bedside chair)?: A Little Help needed to walk in hospital room?: A Lot Help needed climbing 3-5 steps with a railing? : Total 6 Click Score: 15    End of Session   Activity Tolerance: Patient tolerated treatment well;Patient limited by fatigue Patient left: in chair;with call bell/phone within reach;with family/visitor present Nurse Communication: Mobility status PT Visit Diagnosis: Unsteadiness on feet (R26.81);Other abnormalities of gait and mobility (R26.89);Muscle weakness (generalized)  (M62.81)    Time: 6160-7371 PT Time Calculation (min) (ACUTE ONLY): 20 min   Charges:   PT Evaluation $PT Eval Moderate Complexity: 1 Mod PT Treatments $Therapeutic Activity: 8-22 mins        11:42 AM, 08/22/20 Ocie Bob, MPT Physical Therapist with [facility] [phone] office [phone] mobile phone

## 2020-08-22 NOTE — Progress Notes (Signed)
Pt is restless at this time. Pt will not calm down and requesting something for sleep/anxiety. States gets this way on steroids per family member. MD Thomes Dinning made aware via secure chat. Awaiting orders.

## 2020-08-22 NOTE — Progress Notes (Signed)
PROGRESS NOTE    [name] Petersen  ZOX:[id number] DOB: 11/06/1926 DOA: 08/21/2020 PCP: Pcp, No   Brief Narrative:  Patient is a 87 year old overweight Caucasian female with a past medical history significant for but not limited to hypertension, hyperlipidemia, history of CVA, history of atherosclerotic PVD with ulceration status post Left BKA, constipation, macular degeneration, as well as other comorbidities who presented with a chief complaint of shortness of breath.  She complained of increasing shortness of breath which started on Sunday, 08/17/2020 and this was associated with nonproductive cough general body aches.  Some of the history was  obtained from her daughter who was at bedside and patient had a febrile 100.6 at her ALF.  At the facility she is given acetaminophen prior to arrival and had reportedly a sick contact in the facility.  Patient was noted to have shortness of breath with audible wheezing and given breathing treatments without improvement so EMS was called and she was brought to the ED for further evaluation.  She was given a nebulized breathing treatment in the ED and evaluated and was found to be tachypneic and tachycardic.  Chest x-ray showed no acute pulmonary disease but she is given a dose of Solu-Medrol, duo nebs and IV ceftriaxone.  She is also given a liter normal saline and Hospitalist was consulted for further admission. Of Note her influenza a and B and SARS-CoV-2 testing was negative.  Respiratory status is improving but still not back to baseline.  Assessment & Plan:   Principal Problem:   Acute bronchitis Active Problems:   PAD (peripheral artery disease) (HCC)   S/P below knee amputation, left (HCC)   Essential hypertension   Stage 3 chronic kidney disease (HCC)   History of stroke   Hyperlipidemia  Dyspnea and shortness of breath in the setting of likely acute bronchitis -She was given breathing treatments at the ALF and with EMS without relief -She was  placed in observation on MedSurg and initiated on Xopenex every 6-every 4 as needed monitor and will add Atrovent every 6 scheduled -Incentive spirometry and flutter valve -We will continue Solu-Medrol 40 mg every 12 -We will continue azithromycin 250 mg po Daily.  She received a dose of IV ceftriaxone and azithromycin in the ED -Changed to Guaifenesin 1200 mg po BID -Check CXR in the AM  -Continue to Monitor Respiratory Status Carefully -SpO2: 91 % -Continuous pulse oximetry maintain O2 saturations greater than 90% -Repeat CXR in the AM  CKD stage IIIb Metabolic Acidosis  -BUNs/creatinine 21/1.19 and repeat BUN/creatinine was 20/1.26 -Given a Liter of normal saline bolus in the ED -Patient has a Metabolic Acidosis with a CO2 of 18, anion gap of 13, chloride level 105 -Resume IV fluid hydration with normal saline at 75 MLS per hour for 12 hours -Avoid nephrotoxic medications, contrast dyes, hypotension and renally adjust medications -Continue to monitor and trend renal function and repeat CMP in a.m.  Hyperlipidemia -Continue with Pravastatin 10 mg po Daily  History of CVA PAD -Continue with Clopidogrel 75 mg p.o. daily as well as Pravastatin 10 mg po Daily  HTN -C/w Amlodipine 10 mg po Daily  -Continue to Monitor BP per Protocol -Last BP Was 123/70  Hypophosphatemia -Patients Phos Level was 2.3 -Replete with po K Phos Neutral 500 mg po x1 -Continue to Monitor and Trend -Repeat Phos Level in the AM    DVT prophylaxis: Enoxaparin 40 mg sq q24h Code Status: FULL CODE Family Communication: Discussed with Daughter at bedside  Disposition  Plan: Pending further clinical Improvement in Respiratory Status   Status is: Observation  The patient will require care spanning > 2 midnights and should be moved to inpatient because: Unsafe d/c plan, IV treatments appropriate due to intensity of illness or inability to take PO and Inpatient level of care appropriate due to severity of  illness  Dispo: The patient is from: ALF              Anticipated d/c is to: ALF              Patient currently is not medically stable to d/c.   Difficult to place patient No  Consultants:   None   Procedures: None  Antimicrobials:  Anti-infectives (From admission, onward)   Start     Dose/Rate Route Frequency Ordered Stop   08/23/20 1000  azithromycin (ZITHROMAX) tablet 250 mg       "Followed by" Linked Group Details   250 mg Oral Daily 08/21/20 2348 08/27/20 0959   08/22/20 1000  azithromycin (ZITHROMAX) tablet 500 mg       "Followed by" Linked Group Details   500 mg Oral Daily 08/21/20 2348 08/22/20 0840   08/21/20 2045  cefTRIAXone (ROCEPHIN) 1 g in sodium chloride 0.9 % 100 mL IVPB        1 g 200 mL/hr over 30 Minutes Intravenous  Once 08/21/20 2040 08/21/20 2219        Subjective: Seen and examined at bedside that her breathing is better but still not back to her baseline.  Still having some wheezing and some tachypnea.  No chest pain, lightheadedness or dizziness.  Denies any other concerns or symptoms time.  Objective: Vitals:   08/22/20 1100 08/22/20 1130 08/22/20 1244 08/22/20 1448  BP:  140/73 123/70   Pulse: (!) 35 96 89   Resp: 20 16 18    Temp:   98.5 F (36.9 C)   TempSrc:   Oral   SpO2: 92% 94% 92% 91%  Weight:      Height:       No intake or output data in the 24 hours ending 08/22/20 1629 Filed Weights   08/21/20 1957  Weight: 71.2 kg   Examination: Physical Exam:  Constitutional: WN/WD overweight elderly Caucasian female Eyes: Lids normal ENMT: External Ears, Nose appear normal. Hard of hearing  Neck: Appears normal, supple, no cervical masses, normal ROM, no appreciable thyromegaly; no JVD Respiratory: Diminished to auscultation bilaterally with coarse breath sounds and some wheezing; No appreciable rales, rhonchi or crackles. Has a slightly elevated Respiratory effort. Not wearing supplemental O2 via [place].  Cardiovascular: RRR, no murmurs  / rubs / gallops. S1 and S2 auscultated. Minimal Right LE edema  Abdomen: Soft, non-tender, Distended 2/2 to body habitus.  Bowel sounds positive.  GU: Deferred. Musculoskeletal: No clubbing / cyanosis of digits/nails. Left LE BKA Skin: No rashes, lesions, ulcers on a limited skin evaluation. No induration; Warm and dry.  Neurologic: CN 2-12 grossly intact with no focal deficits. Romberg sign and cerebellar reflexes not assessed.  Psychiatric: Normal judgment and insight. Alert and oriented x 3. Normal mood and appropriate affect.   Data Reviewed: I have personally reviewed following labs and imaging studies  CBC: Recent Labs  Lab 08/21/20 2059 08/22/20 0556  WBC 6.3 5.0  NEUTROABS 3.9  --   HGB 14.9 14.8  HCT 46.2* 45.2  MCV 91.7 90.9  PLT 229 213   Basic Metabolic Panel: Recent Labs  Lab 08/21/20 2059 08/22/20 0433  NA 135 136  K 4.1 4.3  CL 99 105  CO2 26 18*  GLUCOSE 118* 210*  BUN 21 20  CREATININE 1.19* 1.26*  CALCIUM 9.3 8.9  MG  --  1.9  PHOS  --  2.3*   GFR: Estimated Creatinine Clearance: 26.9 mL/min (A) (by C-G formula based on SCr of 1.26 mg/dL (H)). Liver Function Tests: Recent Labs  Lab 08/21/20 2059 08/22/20 0433  AST 23 29  ALT 18 17  ALKPHOS 64 58  BILITOT 0.6 1.2  PROT 7.1 6.5  ALBUMIN 3.9 3.5   No results for input(s): LIPASE, AMYLASE in the last 168 hours. No results for input(s): AMMONIA in the last 168 hours. Coagulation Profile: Recent Labs  Lab 08/22/20 0433  INR 1.0   Cardiac Enzymes: No results for input(s): CKTOTAL, CKMB, CKMBINDEX, TROPONINI in the last 168 hours. BNP (last 3 results) No results for input(s): PROBNP in the last 8760 hours. HbA1C: No results for input(s): HGBA1C in the last 72 hours. CBG: No results for input(s): GLUCAP in the last 168 hours. Lipid Profile: No results for input(s): CHOL, HDL, LDLCALC, TRIG, CHOLHDL, LDLDIRECT in the last 72 hours. Thyroid Function Tests: No results for input(s): TSH,  T4TOTAL, FREET4, T3FREE, THYROIDAB in the last 72 hours. Anemia Panel: No results for input(s): VITAMINB12, FOLATE, FERRITIN, TIBC, IRON, RETICCTPCT in the last 72 hours. Sepsis Labs: Recent Labs  Lab 08/21/20 2059 08/21/20 2224  LATICACIDVEN 1.1 1.3    Recent Results (from the past 240 hour(s))  Resp Panel by RT-PCR (Flu A&B, Covid) Nasopharyngeal Swab     Status: None   Collection Time: 08/21/20  8:03 PM   Specimen: Nasopharyngeal Swab; Nasopharyngeal(NP) swabs in vial transport medium  Result Value Ref Range Status   SARS Coronavirus 2 by RT PCR NEGATIVE NEGATIVE Final    Comment: (NOTE) SARS-CoV-2 target nucleic acids are NOT DETECTED.  The SARS-CoV-2 RNA is generally detectable in upper respiratory specimens during the acute phase of infection. The lowest concentration of SARS-CoV-2 viral copies this assay can detect is 138 copies/mL. A negative result does not preclude SARS-Cov-2 infection and should not be used as the sole basis for treatment or other patient management decisions. A negative result may occur with  improper specimen collection/handling, submission of specimen other than nasopharyngeal swab, presence of viral mutation(s) within the areas targeted by this assay, and inadequate number of viral copies(<138 copies/mL). A negative result must be combined with clinical observations, patient history, and epidemiological information. The expected result is Negative.  Fact Sheet for Patients:  BloggerCourse.com  Fact Sheet for Healthcare Providers:  SeriousBroker.it  This test is no t yet approved or cleared by the [place] FDA and  has been authorized for detection and/or diagnosis of SARS-CoV-2 by FDA under an Emergency Use Authorization (EUA). This EUA will remain  in effect (meaning this test can be used) for the duration of the COVID-19 declaration under Section 564(b)(1) of the Act, 21 U.S.C.section  360bbb-3(b)(1), unless the authorization is terminated  or revoked sooner.       Influenza A by PCR NEGATIVE NEGATIVE Final   Influenza B by PCR NEGATIVE NEGATIVE Final    Comment: (NOTE) The Xpert Xpress SARS-CoV-2/FLU/RSV plus assay is intended as an aid in the diagnosis of influenza from Nasopharyngeal swab specimens and should not be used as a sole basis for treatment. Nasal washings and aspirates are unacceptable for Xpert Xpress SARS-CoV-2/FLU/RSV testing.  Fact Sheet for Patients: BloggerCourse.com  Fact Sheet for  Healthcare Providers: SeriousBroker.it  This test is not yet approved or cleared by the [place] and has been authorized for detection and/or diagnosis of SARS-CoV-2 by FDA under an Emergency Use Authorization (EUA). This EUA will remain in effect (meaning this test can be used) for the duration of the COVID-19 declaration under Section 564(b)(1) of the Act, 21 U.S.C. section 360bbb-3(b)(1), unless the authorization is terminated or revoked.  Performed at [facility], [address] [place], [place] [address]   Blood culture (routine x 2)     Status: None (Preliminary result)   Collection Time: 08/21/20  8:59 PM   Specimen: BLOOD  Result Value Ref Range Status   Specimen Description BLOOD LEFT ANTECUBITAL  Final   Special Requests   Final    BOTTLES DRAWN AEROBIC AND ANAEROBIC Blood Culture adequate volume   Culture   Final    NO GROWTH < 12 HOURS Performed at [facility], [address] [place], [place] [address]    Report Status PENDING  Incomplete  Blood culture (routine x 2)     Status: None (Preliminary result)   Collection Time: 08/21/20  8:59 PM   Specimen: BLOOD RIGHT HAND  Result Value Ref Range Status   Specimen Description BLOOD RIGHT HAND  Final   Special Requests   Final    BOTTLES DRAWN AEROBIC AND ANAEROBIC Blood Culture adequate volume   Culture   Final    NO GROWTH < 12  HOURS Performed at [facility], [address], [place], [place] [address]    Report Status PENDING  Incomplete    RN Pressure Injury Documentation: Pressure Injury 07/10/17 serous filled blister (Active)  07/10/17 1845  Location: [place]  Location Orientation: Right;Distal  Staging:   Wound Description (Comments): serous filled blister  Present on Admission: Yes    Estimated body mass index is 28.71 kg/m as calculated from the following:   Height as of this encounter: 5\' 2"  (1.575 m).   Weight as of this encounter: 71.2 kg.  Malnutrition Type:   Malnutrition Characteristics:   Nutrition Interventions:    Radiology Studies: DG Chest Port 1 View  Result Date: 08/21/2020 CLINICAL DATA:  Cough and shortness of breath. EXAM: PORTABLE CHEST 1 VIEW COMPARISON:  April 18, 2007 FINDINGS: Chronic appearing increased interstitial lung markings are seen. There is no evidence of acute infiltrate, pleural effusion or pneumothorax. The heart size and mediastinal contours are within normal limits. Moderate to marked severity calcification of the aortic arch is noted. The visualized skeletal structures are unremarkable. IMPRESSION: No acute cardiopulmonary disease. Electronically Signed   By: April 20, 2007 M.D.   On: 08/21/2020 21:28   Scheduled Meds: . amLODipine  10 mg Oral Daily  . [START ON 08/23/2020] azithromycin  250 mg Oral Daily  . clopidogrel  75 mg Oral Daily  . dextromethorphan-guaiFENesin  1 tablet Oral BID  . enoxaparin (LOVENOX) injection  30 mg Subcutaneous Q24H  . levalbuterol  0.63 mg Nebulization Q6H  . methylPREDNISolone (SOLU-MEDROL) injection  40 mg Intravenous Q12H  . pantoprazole  40 mg Oral Daily  . pravastatin  10 mg Oral q1800   Continuous Infusions:   LOS: 0 days   10/23/2020, DO [facility] PAGER is on AMION  If 7PM-7AM, please contact night-coverage [email or web address]

## 2020-08-22 NOTE — Plan of Care (Signed)
  Problem: Acute Rehab PT Goals(only PT should resolve) Goal: Pt Will Go Supine/Side To Sit Flowsheets (Taken 08/22/2020 1147) Pt will go Supine/Side to Sit: with modified independence Goal: Patient Will Transfer Sit To/From Stand Flowsheets (Taken 08/22/2020 1147) Patient will transfer sit to/from stand: with modified independence Goal: Pt Will Transfer Bed To Chair/Chair To Bed Flowsheets (Taken 08/22/2020 1147) Pt will Transfer Bed to Chair/Chair to Bed:  with modified independence  with supervision   11:47 AM, 08/22/20 Ocie Bob, MPT Physical Therapist with [facility] [phone] office [phone] mobile phone

## 2020-08-22 NOTE — Progress Notes (Signed)
Patient and daughter requested [place] to call Andrey Campanile the nurse from [place] to update her. [facility] and given update and she stated that she has faxed POA papers and will find out if patient has a living will on file with them and will fax if she does.

## 2020-08-22 NOTE — ED Notes (Signed)
Me and Selena assisted patient to the bathroom via wheelchair. Cleaned her bottom and put her back in the bed new brief and pure wick

## 2020-08-22 NOTE — Progress Notes (Signed)
Urine collected via purewick and sent to lab at this time. Pt educated on Ambien ordered and side effects. Pt willing to try medication at this time.

## 2020-08-23 ENCOUNTER — Observation Stay (HOSPITAL_COMMUNITY): Payer: Medicare Other

## 2020-08-23 DIAGNOSIS — J208 Acute bronchitis due to other specified organisms: Secondary | ICD-10-CM | POA: Diagnosis not present

## 2020-08-23 DIAGNOSIS — I70201 Unspecified atherosclerosis of native arteries of extremities, right leg: Secondary | ICD-10-CM | POA: Diagnosis present

## 2020-08-23 DIAGNOSIS — N1832 Chronic kidney disease, stage 3b: Secondary | ICD-10-CM | POA: Diagnosis not present

## 2020-08-23 DIAGNOSIS — E782 Mixed hyperlipidemia: Secondary | ICD-10-CM | POA: Diagnosis not present

## 2020-08-23 DIAGNOSIS — I10 Essential (primary) hypertension: Secondary | ICD-10-CM | POA: Diagnosis not present

## 2020-08-23 DIAGNOSIS — R0602 Shortness of breath: Secondary | ICD-10-CM | POA: Diagnosis not present

## 2020-08-23 DIAGNOSIS — E785 Hyperlipidemia, unspecified: Secondary | ICD-10-CM | POA: Diagnosis present

## 2020-08-23 DIAGNOSIS — Z20822 Contact with and (suspected) exposure to covid-19: Secondary | ICD-10-CM | POA: Diagnosis present

## 2020-08-23 DIAGNOSIS — M1990 Unspecified osteoarthritis, unspecified site: Secondary | ICD-10-CM | POA: Diagnosis present

## 2020-08-23 DIAGNOSIS — F05 Delirium due to known physiological condition: Secondary | ICD-10-CM | POA: Diagnosis present

## 2020-08-23 DIAGNOSIS — I739 Peripheral vascular disease, unspecified: Secondary | ICD-10-CM | POA: Diagnosis not present

## 2020-08-23 DIAGNOSIS — Z89512 Acquired absence of left leg below knee: Secondary | ICD-10-CM | POA: Diagnosis not present

## 2020-08-23 DIAGNOSIS — J80 Acute respiratory distress syndrome: Secondary | ICD-10-CM | POA: Diagnosis not present

## 2020-08-23 DIAGNOSIS — R062 Wheezing: Secondary | ICD-10-CM

## 2020-08-23 DIAGNOSIS — Z8673 Personal history of transient ischemic attack (TIA), and cerebral infarction without residual deficits: Secondary | ICD-10-CM | POA: Diagnosis not present

## 2020-08-23 DIAGNOSIS — F0390 Unspecified dementia without behavioral disturbance: Secondary | ICD-10-CM | POA: Diagnosis present

## 2020-08-23 DIAGNOSIS — J209 Acute bronchitis, unspecified: Secondary | ICD-10-CM | POA: Diagnosis not present

## 2020-08-23 DIAGNOSIS — B9781 Human metapneumovirus as the cause of diseases classified elsewhere: Secondary | ICD-10-CM | POA: Diagnosis present

## 2020-08-23 DIAGNOSIS — J439 Emphysema, unspecified: Secondary | ICD-10-CM | POA: Diagnosis present

## 2020-08-23 DIAGNOSIS — Z888 Allergy status to other drugs, medicaments and biological substances status: Secondary | ICD-10-CM | POA: Diagnosis not present

## 2020-08-23 DIAGNOSIS — E1122 Type 2 diabetes mellitus with diabetic chronic kidney disease: Secondary | ICD-10-CM | POA: Diagnosis present

## 2020-08-23 DIAGNOSIS — I129 Hypertensive chronic kidney disease with stage 1 through stage 4 chronic kidney disease, or unspecified chronic kidney disease: Secondary | ICD-10-CM | POA: Diagnosis present

## 2020-08-23 DIAGNOSIS — G4700 Insomnia, unspecified: Secondary | ICD-10-CM | POA: Diagnosis not present

## 2020-08-23 DIAGNOSIS — N281 Cyst of kidney, acquired: Secondary | ICD-10-CM | POA: Diagnosis not present

## 2020-08-23 DIAGNOSIS — Z90710 Acquired absence of both cervix and uterus: Secondary | ICD-10-CM | POA: Diagnosis not present

## 2020-08-23 DIAGNOSIS — Z7902 Long term (current) use of antithrombotics/antiplatelets: Secondary | ICD-10-CM | POA: Diagnosis not present

## 2020-08-23 DIAGNOSIS — Z79899 Other long term (current) drug therapy: Secondary | ICD-10-CM | POA: Diagnosis not present

## 2020-08-23 DIAGNOSIS — E872 Acidosis: Secondary | ICD-10-CM | POA: Diagnosis not present

## 2020-08-23 DIAGNOSIS — R509 Fever, unspecified: Secondary | ICD-10-CM | POA: Diagnosis not present

## 2020-08-23 DIAGNOSIS — R0902 Hypoxemia: Secondary | ICD-10-CM | POA: Diagnosis present

## 2020-08-23 DIAGNOSIS — E1151 Type 2 diabetes mellitus with diabetic peripheral angiopathy without gangrene: Secondary | ICD-10-CM | POA: Diagnosis present

## 2020-08-23 DIAGNOSIS — J9811 Atelectasis: Secondary | ICD-10-CM | POA: Diagnosis not present

## 2020-08-23 LAB — COMPREHENSIVE METABOLIC PANEL
ALT: 21 U/L (ref 0–44)
AST: 40 U/L (ref 15–41)
Albumin: 3.4 g/dL — ABNORMAL LOW (ref 3.5–5.0)
Alkaline Phosphatase: 52 U/L (ref 38–126)
Anion gap: 11 (ref 5–15)
BUN: 29 mg/dL — ABNORMAL HIGH (ref 8–23)
CO2: 22 mmol/L (ref 22–32)
Calcium: 8.7 mg/dL — ABNORMAL LOW (ref 8.9–10.3)
Chloride: 103 mmol/L (ref 98–111)
Creatinine, Ser: 1.27 mg/dL — ABNORMAL HIGH (ref 0.44–1.00)
GFR, Estimated: 40 mL/min — ABNORMAL LOW (ref >60)
Glucose, Bld: 165 mg/dL — ABNORMAL HIGH (ref 70–99)
Potassium: 3.9 mmol/L (ref 3.5–5.1)
Sodium: 136 mmol/L (ref 135–145)
Total Bilirubin: 0.6 mg/dL (ref 0.3–1.2)
Total Protein: 6.1 g/dL — ABNORMAL LOW (ref 6.5–8.1)

## 2020-08-23 LAB — CBC WITH DIFFERENTIAL/PLATELET
Abs Immature Granulocytes: 0.04 10*3/uL (ref 0.00–0.07)
Basophils Absolute: 0 10*3/uL (ref 0.0–0.1)
Basophils Relative: 0 %
Eosinophils Absolute: 0 10*3/uL (ref 0.0–0.5)
Eosinophils Relative: 0 %
HCT: 42.3 % (ref 36.0–46.0)
Hemoglobin: 14 g/dL (ref 12.0–15.0)
Immature Granulocytes: 0 %
Lymphocytes Relative: 14 %
Lymphs Abs: 1.4 10*3/uL (ref 0.7–4.0)
MCH: 30.1 pg (ref 26.0–34.0)
MCHC: 33.1 g/dL (ref 30.0–36.0)
MCV: 91 fL (ref 80.0–100.0)
Monocytes Absolute: 0.7 10*3/uL (ref 0.1–1.0)
Monocytes Relative: 7 %
Neutro Abs: 7.9 10*3/uL — ABNORMAL HIGH (ref 1.7–7.7)
Neutrophils Relative %: 79 %
Platelets: 223 10*3/uL (ref 150–400)
RBC: 4.65 MIL/uL (ref 3.87–5.11)
RDW: 13.8 % (ref 11.5–15.5)
WBC: 10 10*3/uL (ref 4.0–10.5)
nRBC: 0 % (ref 0.0–0.2)

## 2020-08-23 LAB — D-DIMER, QUANTITATIVE: D-Dimer, Quant: 1.15 ug/mL-FEU — ABNORMAL HIGH (ref 0.00–0.50)

## 2020-08-23 LAB — CULTURE, BLOOD (ROUTINE X 2)
Culture: NO GROWTH
Special Requests: ADEQUATE

## 2020-08-23 LAB — MAGNESIUM: Magnesium: 1.9 mg/dL (ref 1.7–2.4)

## 2020-08-23 LAB — PHOSPHORUS: Phosphorus: 3.3 mg/dL (ref 2.5–4.6)

## 2020-08-23 MED ORDER — AZITHROMYCIN 250 MG PO TABS
500.0000 mg | ORAL_TABLET | Freq: Every day | ORAL | Status: AC
  Administered 2020-08-23 – 2020-08-26 (×4): 500 mg via ORAL
  Filled 2020-08-23 (×3): qty 2

## 2020-08-23 MED ORDER — IOHEXOL 350 MG/ML SOLN
100.0000 mL | Freq: Once | INTRAVENOUS | Status: AC | PRN
  Administered 2020-08-23: 100 mL via INTRAVENOUS

## 2020-08-23 MED ORDER — DIPHENHYDRAMINE HCL 25 MG PO CAPS
25.0000 mg | ORAL_CAPSULE | Freq: Every evening | ORAL | Status: DC | PRN
  Administered 2020-08-24: 25 mg via ORAL
  Filled 2020-08-23: qty 1

## 2020-08-23 MED ORDER — PANTOPRAZOLE SODIUM 40 MG PO TBEC
40.0000 mg | DELAYED_RELEASE_TABLET | Freq: Two times a day (BID) | ORAL | Status: DC
  Administered 2020-08-23 – 2020-08-28 (×11): 40 mg via ORAL
  Filled 2020-08-23 (×10): qty 1

## 2020-08-23 MED ORDER — PANTOPRAZOLE SODIUM 40 MG IV SOLR: 40.0000 mg | INTRAVENOUS | Status: DC

## 2020-08-23 MED ORDER — SODIUM CHLORIDE 0.9 % IV SOLN: INTRAVENOUS | Status: DC

## 2020-08-23 MED ORDER — BUDESONIDE 0.5 MG/2ML IN SUSP
0.5000 mg | Freq: Two times a day (BID) | RESPIRATORY_TRACT | Status: DC
  Administered 2020-08-23 – 2020-08-28 (×11): 0.5 mg via RESPIRATORY_TRACT
  Filled 2020-08-23 (×11): qty 2

## 2020-08-23 MED ORDER — METHYLPREDNISOLONE SODIUM SUCC 125 MG IJ SOLR
60.0000 mg | Freq: Two times a day (BID) | INTRAMUSCULAR | Status: DC
  Administered 2020-08-24 – 2020-08-26 (×5): 60 mg via INTRAVENOUS
  Filled 2020-08-23 (×6): qty 2

## 2020-08-23 NOTE — Progress Notes (Signed)
PROGRESS NOTE    [name] Petersen  MEQ:[id number] DOB: 1926-10-05 DOA: 08/21/2020 PCP: Pcp, No   Brief Narrative:  Patient is a 87 year old overweight Caucasian female with a past medical history significant for but not limited to hypertension, hyperlipidemia, history of CVA, history of atherosclerotic PVD with ulceration status post Left BKA, constipation, macular degeneration, as well as other comorbidities who presented with a chief complaint of shortness of breath.  She complained of increasing shortness of breath which started on Sunday, 08/17/2020 and this was associated with nonproductive cough general body aches.  Some of the history was  obtained from her daughter who was at bedside and patient had a febrile 100.6 at her ALF.  At the facility she is given acetaminophen prior to arrival and had reportedly a sick contact in the facility.  Patient was noted to have shortness of breath with audible wheezing and given breathing treatments without improvement so EMS was called and she was brought to the ED for further evaluation.  She was given a nebulized breathing treatment in the ED and evaluated and was found to be tachypneic and tachycardic.  Chest x-ray showed no acute pulmonary disease but she is given a dose of Solu-Medrol, duo nebs and IV ceftriaxone.  She is also given a liter normal saline and Hospitalist was consulted for further admission. Of Note her influenza a and B and SARS-CoV-2 testing was negative.  Respiratory status is improving but still not back to baseline.  Patient continues to significantly wheeze.  She is now having some delirium so she will be placed on delirium precautions.  Of note she was unable to sleep last night so she was given Ambien and a daughter states that it had an resting reaction so we will not give any more Ambien.  Assessment & Plan:   Principal Problem:   Acute bronchitis Active Problems:   PAD (peripheral artery disease) (HCC)   S/P below knee amputation,  left (HCC)   Essential hypertension   Stage 3 chronic kidney disease (HCC)   History of stroke   Hyperlipidemia  Dyspnea and shortness of breath in the setting of likely acute bronchitis, slightly improved -She was given breathing treatments at the ALF and with EMS without relief -She was placed in observation on MedSurg and initiated on Xopenex every 6-every 4 as needed monitor and will add Atrovent every 6 scheduled; have now added budesonide 0.5 mg p.o. twice daily -Incentive spirometry and flutter valve -We will continue Solu-Medrol 40 mg every 12 but increase the dose to 60 mg every 12 -We will continue azithromycin but increase the dose from 250 mg po Daily to 500 mg 3 times daily.  She received a dose of IV ceftriaxone and azithromycin in the ED -Changed to Guaifenesin 1200 mg po BID -Still has not received her flutter valve or incentive spirometry -Continue to Monitor Respiratory Status Carefully -SpO2: 92 %  -Given her persistent wheezing we will check a respiratory virus panel placed on droplet precautions; of note she is negative for COVID and influenza A and B -Continuous pulse oximetry maintain O2 saturations greater than 90% -Has not resumed her PPI but now made it twice daily -Repeat CXR in the AM; repeat chest x-ray this morning showed "Lung volumes are stable and at the upper limits of normal. Stable cardiac size and mediastinal contours. Cardiac size at the upper limits of normal. Visualized tracheal air column is within normal limits. Allowing for portable technique the lungs are clear. No pneumothorax  or pleural effusion. No acute osseous abnormality identified." -We will check a D-dimer  CKD stage IIIb Metabolic Acidosis  -BUNs/creatinine 21/1.19 and repeat BUN/creatinine was 20/1.26 yesterday and today is 29/1.27 -Given a Liter of normal saline bolus in the ED -Patient had a Metabolic Acidosis with a CO2 of 18, anion gap of 13, chloride level 105; now her CO2 is 22,  anion gap of 11, chloride level is 103 -IV fluid hydration is now stopped -Avoid nephrotoxic medications, contrast dyes, hypotension and renally adjust medications -Continue to monitor and trend renal function and repeat CMP in a.m.  Hyperlipidemia -Continue with Pravastatin 10 mg po Daily  History of CVA PAD -Continue with Clopidogrel 75 mg p.o. daily as well as Pravastatin 10 mg po Daily  HTN -C/w Amlodipine 10 mg po Daily  -Continue to Monitor BP per Protocol -Last BP Was 137/67  Insomnia -In the setting of steroids -Per Nursing she did not sleep on Night shift at all and was restless, tossing, and turning,  -We will not give any more zolpidem and discontinue; may need some Benadryl low-dose 25 mg nightly\  Delirium ? Dementia with Sundowning  -Easily reoriented but has had multiple changes yesterday at ongoing to 3 different floors. Became agitated overnight. -Placed on delirium precautions -Daughter states that she is perseverating continue to monitor carefully things that she should not be  Hypophosphatemia -Patients Phos Level was 3.3 -Continue to Monitor and Trend -Repeat Phos Level in the AM    DVT prophylaxis: Enoxaparin 40 mg sq q24h Code Status: FULL CODE Family Communication: Discussed with Daughter at bedside  Disposition Plan: Pending further clinical Improvement in Respiratory Status   Status is: Observation  The patient will require care spanning > 2 midnights and should be moved to inpatient because: Unsafe d/c plan, IV treatments appropriate due to intensity of illness or inability to take PO and Inpatient level of care appropriate due to severity of illness  Dispo: The patient is from: ALF              Anticipated d/c is to: ALF              Patient currently is not medically stable to d/c.   Difficult to place patient No  Consultants:   None but if she is not improving by the morning we will obtain a CT scan of the chest and obtain pulmonary  consultation   Procedures: None  Antimicrobials:  Anti-infectives (From admission, onward)   Start     Dose/Rate Route Frequency Ordered Stop   08/23/20 1000  azithromycin (ZITHROMAX) tablet 250 mg  Status:  Discontinued       "Followed by" Linked Group Details   250 mg Oral Daily 08/21/20 2348 08/23/20 0930   08/23/20 1000  azithromycin (ZITHROMAX) tablet 500 mg       "Followed by" Linked Group Details   500 mg Oral Daily 08/23/20 0930 08/27/20 0959   08/22/20 1000  azithromycin (ZITHROMAX) tablet 500 mg       "Followed by" Linked Group Details   500 mg Oral Daily 08/21/20 2348 08/22/20 0840   08/21/20 2045  cefTRIAXone (ROCEPHIN) 1 g in sodium chloride 0.9 % 100 mL IVPB        1 g 200 mL/hr over 30 Minutes Intravenous  Once 08/21/20 2040 08/21/20 2219        Subjective: Seen and examined at bedside and still has some significant upper airway wheezing and breathing is a little bit better  but not back to baseline.  States that she has not been able to sleep properly and has been up most of the night.  Has had multiple changes and daughter concerned about her mild confusion.  She denies any chest pain.  States her shortness of breath is improving.  She denies any lightheadedness or dizziness.  No other concerns or complaints at this time.  Objective: Vitals:   08/23/20 0500 08/23/20 0817 08/23/20 1030 08/23/20 1105  BP: 130/80   131/67  Pulse: 76     Resp: 20     Temp: 99.2 F (37.3 C)     TempSrc: Oral     SpO2: 92% 93% 92%   Weight:      Height:        Intake/Output Summary (Last 24 hours) at 08/23/2020 1215 Last data filed at 08/23/2020 0830 Gross per 24 hour  Intake 890.46 ml  Output 120 ml  Net 770.46 ml   Filed Weights   08/21/20 1957  Weight: 71.2 kg   Examination: Physical Exam:  Constitutional: WN/WD overweight elderly Caucasian female and mild distress and slightly confused  Eyes: Lids and conjunctivae normal, sclerae anicteric  ENMT: External Ears, Nose  appear normal. Grossly normal hearing.  Neck: Appears normal, supple, no cervical masses, normal ROM, no appreciable thyromegaly; no JVD Respiratory: Diminished to auscultation bilaterally with coarse breath sounds and audible wheezing more upper airway and slightly rhonchi.  Has unlabored breathing and not wearing any supplemental oxygen via nasal cannula Cardiovascular: RRR, no murmurs / rubs / gallops. S1 and S2 auscultated.  Trace lower extremity edema Abdomen: Soft, non-tender, distended secondary to body habitus. Bowel sounds positive.  GU: Deferred. Musculoskeletal: No clubbing / cyanosis of digits/nails. No joint deformity upper and lower extremities. Skin: No rashes, lesions, ulcers on limited skin evaluation. No induration; Warm and dry.  Neurologic: CN 2-12 grossly intact with no focal deficits. Romberg sign and cerebellar reflexes not assessed.  Psychiatric: Normal judgment and insight.  She is awake and alert but slightly confused. Normal mood and appropriate affect.   Data Reviewed: I have personally reviewed following labs and imaging studies  CBC: Recent Labs  Lab 08/21/20 2059 08/22/20 0556 08/23/20 0646  WBC 6.3 5.0 10.0  NEUTROABS 3.9  --  7.9*  HGB 14.9 14.8 14.0  HCT 46.2* 45.2 42.3  MCV 91.7 90.9 91.0  PLT 229 213 223   Basic Metabolic Panel: Recent Labs  Lab 08/21/20 2059 08/22/20 0433 08/23/20 0646  NA 135 136 136  K 4.1 4.3 3.9  CL 99 105 103  CO2 26 18* 22  GLUCOSE 118* 210* 165*  BUN 21 20 29*  CREATININE 1.19* 1.26* 1.27*  CALCIUM 9.3 8.9 8.7*  MG  --  1.9 1.9  PHOS  --  2.3* 3.3   GFR: Estimated Creatinine Clearance: 26.6 mL/min (A) (by C-G formula based on SCr of 1.27 mg/dL (H)). Liver Function Tests: Recent Labs  Lab 08/21/20 2059 08/22/20 0433 08/23/20 0646  AST 23 29 40  ALT 18 17 21   ALKPHOS 64 58 52  BILITOT 0.6 1.2 0.6  PROT 7.1 6.5 6.1*  ALBUMIN 3.9 3.5 3.4*   No results for input(s): LIPASE, AMYLASE in the last 168  hours. No results for input(s): AMMONIA in the last 168 hours. Coagulation Profile: Recent Labs  Lab 08/22/20 0433  INR 1.0   Cardiac Enzymes: No results for input(s): CKTOTAL, CKMB, CKMBINDEX, TROPONINI in the last 168 hours. BNP (last 3 results) No results  for input(s): PROBNP in the last 8760 hours. HbA1C: No results for input(s): HGBA1C in the last 72 hours. CBG: No results for input(s): GLUCAP in the last 168 hours. Lipid Profile: No results for input(s): CHOL, HDL, LDLCALC, TRIG, CHOLHDL, LDLDIRECT in the last 72 hours. Thyroid Function Tests: No results for input(s): TSH, T4TOTAL, FREET4, T3FREE, THYROIDAB in the last 72 hours. Anemia Panel: No results for input(s): VITAMINB12, FOLATE, FERRITIN, TIBC, IRON, RETICCTPCT in the last 72 hours. Sepsis Labs: Recent Labs  Lab 08/21/20 2059 08/21/20 2224  LATICACIDVEN 1.1 1.3    Recent Results (from the past 240 hour(s))  Resp Panel by RT-PCR (Flu A&B, Covid) Nasopharyngeal Swab     Status: None   Collection Time: 08/21/20  8:03 PM   Specimen: Nasopharyngeal Swab; Nasopharyngeal(NP) swabs in vial transport medium  Result Value Ref Range Status   SARS Coronavirus 2 by RT PCR NEGATIVE NEGATIVE Final    Comment: (NOTE) SARS-CoV-2 target nucleic acids are NOT DETECTED.  The SARS-CoV-2 RNA is generally detectable in upper respiratory specimens during the acute phase of infection. The lowest concentration of SARS-CoV-2 viral copies this assay can detect is 138 copies/mL. A negative result does not preclude SARS-Cov-2 infection and should not be used as the sole basis for treatment or other patient management decisions. A negative result may occur with  improper specimen collection/handling, submission of specimen other than nasopharyngeal swab, presence of viral mutation(s) within the areas targeted by this assay, and inadequate number of viral copies(<138 copies/mL). A negative result must be combined with clinical  observations, patient history, and epidemiological information. The expected result is Negative.  Fact Sheet for Patients:  BloggerCourse.com  Fact Sheet for Healthcare Providers:  SeriousBroker.it  This test is no t yet approved or cleared by the [place] FDA and  has been authorized for detection and/or diagnosis of SARS-CoV-2 by FDA under an Emergency Use Authorization (EUA). This EUA will remain  in effect (meaning this test can be used) for the duration of the COVID-19 declaration under Section 564(b)(1) of the Act, 21 U.S.C.section 360bbb-3(b)(1), unless the authorization is terminated  or revoked sooner.       Influenza A by PCR NEGATIVE NEGATIVE Final   Influenza B by PCR NEGATIVE NEGATIVE Final    Comment: (NOTE) The Xpert Xpress SARS-CoV-2/FLU/RSV plus assay is intended as an aid in the diagnosis of influenza from Nasopharyngeal swab specimens and should not be used as a sole basis for treatment. Nasal washings and aspirates are unacceptable for Xpert Xpress SARS-CoV-2/FLU/RSV testing.  Fact Sheet for Patients: BloggerCourse.com  Fact Sheet for Healthcare Providers: SeriousBroker.it  This test is not yet approved or cleared by the [place] FDA and has been authorized for detection and/or diagnosis of SARS-CoV-2 by FDA under an Emergency Use Authorization (EUA). This EUA will remain in effect (meaning this test can be used) for the duration of the COVID-19 declaration under Section 564(b)(1) of the Act, 21 U.S.C. section 360bbb-3(b)(1), unless the authorization is terminated or revoked.  Performed at [facility], [address] [place], [place] [address]   Blood culture (routine x 2)     Status: None (Preliminary result)   Collection Time: 08/21/20  8:59 PM   Specimen: BLOOD  Result Value Ref Range Status   Specimen Description BLOOD LEFT ANTECUBITAL   Final   Special Requests   Final    BOTTLES DRAWN AEROBIC AND ANAEROBIC Blood Culture adequate volume   Culture   Final    NO  GROWTH < 12 HOURS Performed at [facility], [address] [place], [place] [address]    Report Status PENDING  Incomplete  Blood culture (routine x 2)     Status: None (Preliminary result)   Collection Time: 08/21/20  8:59 PM   Specimen: BLOOD RIGHT HAND  Result Value Ref Range Status   Specimen Description BLOOD RIGHT HAND  Final   Special Requests   Final    BOTTLES DRAWN AEROBIC AND ANAEROBIC Blood Culture adequate volume   Culture   Final    NO GROWTH < 12 HOURS Performed at [facility], [address], [place], [place] [address]    Report Status PENDING  Incomplete    RN Pressure Injury Documentation: Pressure Injury 07/10/17 serous filled blister (Active)  07/10/17 1845  Location: Heel  Location Orientation: Right;Distal  Staging:   Wound Description (Comments): serous filled blister  Present on Admission: Yes    Estimated body mass index is 28.71 kg/m as calculated from the following:   Height as of this encounter: 5\' 2"  (1.575 m).   Weight as of this encounter: 71.2 kg.  Malnutrition Type:   Malnutrition Characteristics:   Nutrition Interventions:    Radiology Studies: DG CHEST PORT 1 VIEW  Result Date: 08/23/2020 CLINICAL DATA:  87 year old female with fever. Negative for COVID-19 2 days ago. EXAM: PORTABLE CHEST 1 VIEW COMPARISON:  Portable chest 08/21/2020 and earlier. FINDINGS: Portable AP semi upright view at 0449 hours. Lung volumes are stable and at the upper limits of normal. Stable cardiac size and mediastinal contours. Cardiac size at the upper limits of normal. Visualized tracheal air column is within normal limits. Allowing for portable technique the lungs are clear. No pneumothorax or pleural effusion. No acute osseous abnormality identified. IMPRESSION: Stable.  No acute cardiopulmonary abnormality. Electronically Signed    By: 10/21/2020 M.D.   On: 08/23/2020 05:38   DG Chest Port 1 View  Result Date: 08/21/2020 CLINICAL DATA:  Cough and shortness of breath. EXAM: PORTABLE CHEST 1 VIEW COMPARISON:  April 18, 2007 FINDINGS: Chronic appearing increased interstitial lung markings are seen. There is no evidence of acute infiltrate, pleural effusion or pneumothorax. The heart size and mediastinal contours are within normal limits. Moderate to marked severity calcification of the aortic arch is noted. The visualized skeletal structures are unremarkable. IMPRESSION: No acute cardiopulmonary disease. Electronically Signed   By: April 20, 2007 M.D.   On: 08/21/2020 21:28   Scheduled Meds: . amLODipine  10 mg Oral Daily  . azithromycin  500 mg Oral Daily  . budesonide (PULMICORT) nebulizer solution  0.5 mg Nebulization BID  . clopidogrel  75 mg Oral Daily  . enoxaparin (LOVENOX) injection  30 mg Subcutaneous Q24H  . guaiFENesin  1,200 mg Oral BID  . ipratropium  0.5 mg Nebulization Q6H  . levalbuterol  0.63 mg Nebulization Q6H  . methylPREDNISolone (SOLU-MEDROL) injection  60 mg Intravenous Q12H  . pantoprazole  40 mg Oral BID  . pravastatin  10 mg Oral q1800   Continuous Infusions:   LOS: 0 days   10/21/2020, DO [facility] PAGER is on AMION  If 7PM-7AM, please contact night-coverage [email or web address]

## 2020-08-23 NOTE — Progress Notes (Signed)
1:58 AM RN called due to patient still being restless and awake. She was reported to be tossing, turning in bed, whipped out her purewick and almost pulled the IV pole down on herself).  Ambien was already given earlier.  At bedside, daughter was asleep, patient was now more calm and not agitated. We shall continue to monitor patient and redirect for any confusion.  This is possibly due to sundowning.

## 2020-08-23 NOTE — Progress Notes (Signed)
Patient didn't receive HS dose Solu-Medrol. Pt's daughter at bedside and stated that patient had a rough night last night and think that it was related to the steroids. Pt's daughter want to discuss steroids again with the doctor and hold off on this HS dose.

## 2020-08-23 NOTE — Plan of Care (Signed)
Patient did not sleep at all this shift. She has been restless, tossing, turning, throwing her watch, urinating all over the bed multiple times as she is tossing her purewick like a lasso, and had bath at 3am. Pt has been redirected multiple times, given ambien 5mg  to help sleep with no effect on her. Daughter at bedside. Daughter states mother is usually a quiet person who never "barks orders and demanding" as she has been today. Pt aggravated with IV pole, even grabbing it and turning it to her while pulling it almost on top of her. Pt linens changed at this time. Warm blankets provided. MD made aware of behavior via secure chat and face to face.  Problem: Health Behavior/Discharge Planning: Goal: Ability to manage health-related needs will improve Outcome: Progressing   Problem: Clinical Measurements: Goal: Respiratory complications will improve Outcome: Progressing   Problem: Activity: Goal: Risk for activity intolerance will decrease Outcome: Progressing   Problem: Safety: Goal: Ability to remain free from injury will improve Outcome: Progressing

## 2020-08-23 NOTE — Progress Notes (Signed)
Writer went to administer lovenox injection. Deferred injection as patient was asleep at this time after being up all night.  Defer to 8am.

## 2020-08-24 ENCOUNTER — Inpatient Hospital Stay (HOSPITAL_COMMUNITY): Payer: Medicare Other

## 2020-08-24 LAB — RESPIRATORY PANEL BY PCR

## 2020-08-24 LAB — CBC WITH DIFFERENTIAL/PLATELET
Abs Immature Granulocytes: 0.05 10*3/uL (ref 0.00–0.07)
Basophils Absolute: 0 10*3/uL (ref 0.0–0.1)
Basophils Relative: 0 %
Eosinophils Absolute: 0 10*3/uL (ref 0.0–0.5)
Eosinophils Relative: 0 %
HCT: 43.5 % (ref 36.0–46.0)
Hemoglobin: 14 g/dL (ref 12.0–15.0)
Immature Granulocytes: 1 %
Lymphocytes Relative: 14 %
Lymphs Abs: 1.3 10*3/uL (ref 0.7–4.0)
MCH: 29.5 pg (ref 26.0–34.0)
MCHC: 32.2 g/dL (ref 30.0–36.0)
MCV: 91.8 fL (ref 80.0–100.0)
Monocytes Absolute: 0.8 10*3/uL (ref 0.1–1.0)
Monocytes Relative: 9 %
Neutro Abs: 7.3 10*3/uL (ref 1.7–7.7)
Neutrophils Relative %: 76 %
Platelets: 231 10*3/uL (ref 150–400)
RBC: 4.74 MIL/uL (ref 3.87–5.11)
RDW: 13.8 % (ref 11.5–15.5)
WBC: 9.5 10*3/uL (ref 4.0–10.5)
nRBC: 0.2 % (ref 0.0–0.2)

## 2020-08-24 LAB — COMPREHENSIVE METABOLIC PANEL
ALT: 25 U/L (ref 0–44)
AST: 40 U/L (ref 15–41)
Albumin: 3.4 g/dL — ABNORMAL LOW (ref 3.5–5.0)
Alkaline Phosphatase: 49 U/L (ref 38–126)
Anion gap: 9 (ref 5–15)
BUN: 31 mg/dL — ABNORMAL HIGH (ref 8–23)
CO2: 24 mmol/L (ref 22–32)
Calcium: 8.8 mg/dL — ABNORMAL LOW (ref 8.9–10.3)
Chloride: 105 mmol/L (ref 98–111)
Creatinine, Ser: 1.21 mg/dL — ABNORMAL HIGH (ref 0.44–1.00)
GFR, Estimated: 42 mL/min — ABNORMAL LOW (ref >60)
Glucose, Bld: 141 mg/dL — ABNORMAL HIGH (ref 70–99)
Potassium: 4.4 mmol/L (ref 3.5–5.1)
Sodium: 138 mmol/L (ref 135–145)
Total Bilirubin: 0.6 mg/dL (ref 0.3–1.2)
Total Protein: 6.2 g/dL — ABNORMAL LOW (ref 6.5–8.1)

## 2020-08-24 LAB — PHOSPHORUS: Phosphorus: 3.3 mg/dL (ref 2.5–4.6)

## 2020-08-24 LAB — MAGNESIUM: Magnesium: 2.2 mg/dL (ref 1.7–2.4)

## 2020-08-24 MED ORDER — FUROSEMIDE 10 MG/ML IJ SOLN
40.0000 mg | Freq: Once | INTRAMUSCULAR | Status: AC
  Administered 2020-08-24: 40 mg via INTRAVENOUS
  Filled 2020-08-24: qty 4

## 2020-08-24 MED ORDER — ONDANSETRON HCL 4 MG/2ML IJ SOLN: 4.0000 mg | Freq: Four times a day (QID) | INTRAMUSCULAR | Status: DC | PRN

## 2020-08-24 NOTE — Progress Notes (Signed)
PROGRESS NOTE    [name] Petersen  BLT:[id number] DOB: November 01, 1926 DOA: 08/21/2020 PCP: Pcp, No   Brief Narrative:  Patient is a 87 year old overweight Caucasian female with a past medical history significant for but not limited to hypertension, hyperlipidemia, history of CVA, history of atherosclerotic PVD with ulceration status post Left BKA, constipation, macular degeneration, as well as other comorbidities who presented with a chief complaint of shortness of breath.  She complained of increasing shortness of breath which started on Sunday, 08/17/2020 and this was associated with nonproductive cough general body aches.  Some of the history was  obtained from her daughter who was at bedside and patient had a febrile 100.6 at her ALF.  At the facility she is given acetaminophen prior to arrival and had reportedly a sick contact in the facility.  Patient was noted to have shortness of breath with audible wheezing and given breathing treatments without improvement so EMS was called and she was brought to the ED for further evaluation.  She was given a nebulized breathing treatment in the ED and evaluated and was found to be tachypneic and tachycardic.  Chest x-ray showed no acute pulmonary disease but she is given a dose of Solu-Medrol, duo nebs and IV ceftriaxone.  She is also given a liter normal saline and Hospitalist was consulted for further admission. Of Note her influenza a and B and SARS-CoV-2 testing was negative.  Respiratory status is improving but still not back to baseline.  Patient continues to significantly wheeze.  She is now having some delirium so she will be placed on delirium precautions.  Of note she was unable to sleep last night so she was given Ambien and a daughter states that it had an resting reaction so we will not give any more Ambien.  She continues to wheeze significantly but is improving minimally.  Refused her Solu-Medrol last night because of her delirium the night before.   Because of lack of improvement will reach out to pulmonary and respiratory virus panel still pending.  Assessment & Plan:   Principal Problem:   Acute bronchitis Active Problems:   PAD (peripheral artery disease) (HCC)   S/P below knee amputation, left (HCC)   Essential hypertension   Stage 3 chronic kidney disease (HCC)   History of stroke   Hyperlipidemia  Dyspnea and shortness of breath in the setting of likely acute bronchitis and suspected COPD exacerbation, persistent -She was given breathing treatments at the ALF and with EMS without relief -She was placed in observation on MedSurg and initiated on Xopenex every 6-every 4 as needed monitor and will add Atrovent every 6 scheduled; have now added budesonide 0.5 mg p.o. twice daily -Incentive spirometry and flutter valve -We will continue Solu-Medrol 40 mg every 12 but increase the dose to 60 mg every 12h however family refuses last night given that she became delirious. -We will continue azithromycin but increase the dose from 250 mg po Daily to 500 mg 3 times daily.  She received a dose of IV ceftriaxone and azithromycin in the ED -Will add scheduled budesonide and Brovana -Changed to Guaifenesin 1200 mg po BID -Still has not received her flutter valve or incentive spirometry -Continue to Monitor Respiratory Status Carefully -SpO2: 97 % O2 Flow Rate (L/min): 1.5 L/min; she was placed on oxygen for a O2 saturation 91% -Given her persistent wheezing we will check a respiratory virus panel placed on droplet precautions; of note she is negative for COVID and influenza A and B -  Continuous pulse oximetry maintain O2 saturations greater than 90% -Has now resumed her PPI but now made it twice daily -D-dimer is elevated to 1.15 so obtain a CTA PE protocol -CTA showed "No evidence of pulmonary emboli. Scattered hepatic and renal cysts. Rounded density of the right breast which may represent a cyst. The need for further evaluation can be  determined on a clinical basis given the patient's age. No prior exam is available for comparison. Aortic Atherosclerosis and Emphysema." -Will give a Dose of IV Lasix 40 mg x1 -Repeat CXR in the AM; repeat chest x-ray this morning showed "No acute cardiopulmonary abnormality." -Will get Pulmonary Involvement given lack of Improvement  -Checking Respirator Virus Panel as above and still pending   CKD stage IIIb Metabolic Acidosis  -BUNs/creatinine 21/1.19 and repeat BUN/creatinine was 20/1.26 yesterday and today is 29/1.27 -> 31/1.21 -Given a Liter of normal saline bolus in the ED and was resumed on IVF yesterday but will stop -Patient had a Metabolic Acidosis with a CO2 of 18, anion gap of 13, chloride level 105; now her CO2 is 24, anion gap of 9, chloride level is 105 -Avoid nephrotoxic medications, contrast dyes, hypotension and renally adjust medications -Continue to monitor and trend renal function and repeat CMP in a.m.  Hyperlipidemia -Continue with Pravastatin 10 mg po Daily  History of CVA PAD -Continue with Clopidogrel 75 mg p.o. daily as well as Pravastatin 10 mg po Daily  HTN -C/w Amlodipine 10 mg po Daily  -Continue to Monitor BP per Protocol -Last BP Was 153/104  Insomnia -In the setting of steroids -Per Nursing she did not sleep on Night shift at all and was restless, tossing, and turning,  -We will not give any more zolpidem and discontinue; may need some Benadryl low-dose 25 mg nightly\  Delirium ? Dementia with Sundowning  -Easily reoriented but has had multiple changes the day before yesterday at ongoing to 3 different floors. Became agitated overnight. -Placed on delirium precautions -Daughter states that she is perseverating continue to monitor carefully things that she should not be  Hypophosphatemia -Patients Phos Level was 3.3 now -Continue to Monitor and Trend -Repeat Phos Level in the AM    DVT prophylaxis: Enoxaparin 40 mg sq q24h Code Status: FULL  CODE Family Communication: Discussed with Daughter at bedside  Disposition Plan: Pending further clinical Improvement in Respiratory Status   Status is: Inpatient  Remains inpatient appropriate because:Unsafe d/c plan, IV treatments appropriate due to intensity of illness or inability to take PO and Inpatient level of care appropriate due to severity of illness   Dispo: The patient is from: Home              Anticipated d/c is to: Home              Patient currently is not medically stable to d/c.   Difficult to place patient No  Consultants:   None but will obtain Pulmonary Consultation in the AM    Procedures: None  Antimicrobials:  Anti-infectives (From admission, onward)   Start     Dose/Rate Route Frequency Ordered Stop   08/23/20 1000  azithromycin (ZITHROMAX) tablet 250 mg  Status:  Discontinued       "Followed by" Linked Group Details   250 mg Oral Daily 08/21/20 2348 08/23/20 0930   08/23/20 1000  azithromycin (ZITHROMAX) tablet 500 mg       "Followed by" Linked Group Details   500 mg Oral Daily 08/23/20 0930 08/27/20 0959  08/22/20 1000  azithromycin (ZITHROMAX) tablet 500 mg       "Followed by" Linked Group Details   500 mg Oral Daily 08/21/20 2348 08/22/20 0840   08/21/20 2045  cefTRIAXone (ROCEPHIN) 1 g in sodium chloride 0.9 % 100 mL IVPB        1 g 200 mL/hr over 30 Minutes Intravenous  Once 08/21/20 2040 08/21/20 2219        Subjective: Seen and examined at bedside and states that she is doing little bit better this morning but then had a period where she started coughing significantly and started feeling worse.  Still wheezing quite a bit but thinks it may be a little bit better since admission.  No nausea or vomiting.  Refused her Solu-Medrol last night given delirium the night before.  Because she has failed to improve we will get pulmonary to further evaluate  Objective: Vitals:   08/23/20 2135 08/24/20 0454 08/24/20 0926 08/24/20 0935  BP: (!)  155/92 (!) 153/104    Pulse: 76 87    Resp: 20 18    Temp: 98.6 F (37 C) 98.6 F (37 C)    TempSrc: Oral Oral    SpO2: 91% 96% 91% 97%  Weight:      Height:        Intake/Output Summary (Last 24 hours) at 08/24/2020 1112 Last data filed at 08/24/2020 1610 Gross per 24 hour  Intake 1015.29 ml  Output 250 ml  Net 765.29 ml   Filed Weights   08/21/20 1957  Weight: 71.2 kg   Examination: Physical Exam:  Constitutional: WN/WD overweight Caucasian female in some respiratory distress and wheezing and coughing Eyes: Lids and conjunctivae normal, sclerae anicteric  ENMT: External Ears, Nose appear normal. Grossly normal hearing.  Neck: Appears normal, supple, no cervical masses, normal ROM, no appreciable thyromegaly; no JVD Respiratory: Diminished to auscultation bilaterally with coarse breath sounds and significant expiratory wheezing but no appreciable rales, rhonchi or crackles. Slightly increased respiratory effort. Wearing 2 Liters of Supplemental O2 via [place] Cardiovascular: RRR, no murmurs / rubs / gallops. S1 and S2 auscultated. Mild extremity edema on the Right Leg Abdomen: Soft, non-tender, Distended 2/2 body habitus. Bowel sounds positive.  GU: Deferred. Musculoskeletal: No clubbing / cyanosis of digits/nails. Has a Left BKA  Skin: No rashes, lesions, ulcers on a limited skin evaluation. No induration; Warm and dry.  Neurologic: CN 2-12 grossly intact with no focal deficits. Romberg sign and cerebellar reflexes not assessed.  Psychiatric: Normal judgment and insight. Alert and oriented x 3. Mildly anxious mood and appropriate affect.   Data Reviewed: I have personally reviewed following labs and imaging studies  CBC: Recent Labs  Lab 08/21/20 2059 08/22/20 0556 08/23/20 0646 08/24/20 0343  WBC 6.3 5.0 10.0 9.5  NEUTROABS 3.9  --  7.9* 7.3  HGB 14.9 14.8 14.0 14.0  HCT 46.2* 45.2 42.3 43.5  MCV 91.7 90.9 91.0 91.8  PLT 229 213 223 231   Basic Metabolic  Panel: Recent Labs  Lab 08/21/20 2059 08/22/20 0433 08/23/20 0646 08/24/20 0343  NA 135 136 136 138  K 4.1 4.3 3.9 4.4  CL 99 105 103 105  CO2 26 18* 22 24  GLUCOSE 118* 210* 165* 141*  BUN 21 20 29* 31*  CREATININE 1.19* 1.26* 1.27* 1.21*  CALCIUM 9.3 8.9 8.7* 8.8*  MG  --  1.9 1.9 2.2  PHOS  --  2.3* 3.3 3.3   GFR: Estimated Creatinine Clearance: 28 mL/min (A) (by  C-G formula based on SCr of 1.21 mg/dL (H)). Liver Function Tests: Recent Labs  Lab 08/21/20 2059 08/22/20 0433 08/23/20 0646 08/24/20 0343  AST 23 29 40 40  ALT ALKPHOS 64 58 52 49  BILITOT 0.6 1.2 0.6 0.6  PROT 7.1 6.5 6.1* 6.2*  ALBUMIN 3.9 3.5 3.4* 3.4*   No results for input(s): LIPASE, AMYLASE in the last 168 hours. No results for input(s): AMMONIA in the last 168 hours. Coagulation Profile: Recent Labs  Lab 08/22/20 0433  INR 1.0   Cardiac Enzymes: No results for input(s): CKTOTAL, CKMB, CKMBINDEX, TROPONINI in the last 168 hours. BNP (last 3 results) No results for input(s): PROBNP in the last 8760 hours. HbA1C: No results for input(s): HGBA1C in the last 72 hours. CBG: No results for input(s): GLUCAP in the last 168 hours. Lipid Profile: No results for input(s): CHOL, HDL, LDLCALC, TRIG, CHOLHDL, LDLDIRECT in the last 72 hours. Thyroid Function Tests: No results for input(s): TSH, T4TOTAL, FREET4, T3FREE, THYROIDAB in the last 72 hours. Anemia Panel: No results for input(s): VITAMINB12, FOLATE, FERRITIN, TIBC, IRON, RETICCTPCT in the last 72 hours. Sepsis Labs: Recent Labs  Lab 08/21/20 2059 08/21/20 2224  LATICACIDVEN 1.1 1.3    Recent Results (from the past 240 hour(s))  Resp Panel by RT-PCR (Flu A&B, Covid) Nasopharyngeal Swab     Status: None   Collection Time: 08/21/20  8:03 PM   Specimen: Nasopharyngeal Swab; Nasopharyngeal(NP) swabs in vial transport medium  Result Value Ref Range Status   SARS Coronavirus 2 by RT PCR NEGATIVE NEGATIVE Final    Comment:  (NOTE) SARS-CoV-2 target nucleic acids are NOT DETECTED.  The SARS-CoV-2 RNA is generally detectable in upper respiratory specimens during the acute phase of infection. The lowest concentration of SARS-CoV-2 viral copies this assay can detect is 138 copies/mL. A negative result does not preclude SARS-Cov-2 infection and should not be used as the sole basis for treatment or other patient management decisions. A negative result may occur with  improper specimen collection/handling, submission of specimen other than nasopharyngeal swab, presence of viral mutation(s) within the areas targeted by this assay, and inadequate number of viral copies(<138 copies/mL). A negative result must be combined with clinical observations, patient history, and epidemiological information. The expected result is Negative.  Fact Sheet for Patients:  BloggerCourse.com  Fact Sheet for Healthcare Providers:  SeriousBroker.it  This test is no t yet approved or cleared by the [place] FDA and  has been authorized for detection and/or diagnosis of SARS-CoV-2 by FDA under an Emergency Use Authorization (EUA). This EUA will remain  in effect (meaning this test can be used) for the duration of the COVID-19 declaration under Section 564(b)(1) of the Act, 21 U.S.C.section 360bbb-3(b)(1), unless the authorization is terminated  or revoked sooner.       Influenza A by PCR NEGATIVE NEGATIVE Final   Influenza B by PCR NEGATIVE NEGATIVE Final    Comment: (NOTE) The Xpert Xpress SARS-CoV-2/FLU/RSV plus assay is intended as an aid in the diagnosis of influenza from Nasopharyngeal swab specimens and should not be used as a sole basis for treatment. Nasal washings and aspirates are unacceptable for Xpert Xpress SARS-CoV-2/FLU/RSV testing.  Fact Sheet for Patients: BloggerCourse.com  Fact Sheet for Healthcare  Providers: SeriousBroker.it  This test is not yet approved or cleared by the [place] FDA and has been authorized for detection and/or diagnosis of SARS-CoV-2 by FDA under an Emergency Use Authorization (EUA). This EUA  will remain in effect (meaning this test can be used) for the duration of the COVID-19 declaration under Section 564(b)(1) of the Act, 21 U.S.C. section 360bbb-3(b)(1), unless the authorization is terminated or revoked.  Performed at [facility], [address] [place], [place] [address]   Blood culture (routine x 2)     Status: None (Preliminary result)   Collection Time: 08/21/20  8:59 PM   Specimen: BLOOD  Result Value Ref Range Status   Specimen Description BLOOD LEFT ANTECUBITAL  Final   Special Requests   Final    BOTTLES DRAWN AEROBIC AND ANAEROBIC Blood Culture adequate volume   Culture   Final    NO GROWTH 3 DAYS Performed at [facility], [address], [place], [place] [address]    Report Status PENDING  Incomplete  Blood culture (routine x 2)     Status: None (Preliminary result)   Collection Time: 08/21/20  8:59 PM   Specimen: BLOOD RIGHT HAND  Result Value Ref Range Status   Specimen Description BLOOD RIGHT HAND  Final   Special Requests   Final    BOTTLES DRAWN AEROBIC AND ANAEROBIC Blood Culture adequate volume   Culture   Final    NO GROWTH 3 DAYS Performed at [facility], [address] [place], [place] [address]    Report Status PENDING  Incomplete    RN Pressure Injury Documentation: Pressure Injury 07/10/17 serous filled blister (Active)  07/10/17 1845  Location: [place]  Location Orientation: Right;Distal  Staging:   Wound Description (Comments): serous filled blister  Present on Admission: Yes    Estimated body mass index is 28.71 kg/m as calculated from the following:   Height as of this encounter: 5\' 2"  (1.575 m).   Weight as of this encounter: 71.2 kg.  Malnutrition Type:   Malnutrition  Characteristics:   Nutrition Interventions:    Radiology Studies: CT ANGIO CHEST PE W OR WO CONTRAST  Result Date: 08/23/2020 CLINICAL DATA:  Respiratory distress EXAM: CT ANGIOGRAPHY CHEST WITH CONTRAST TECHNIQUE: Multidetector CT imaging of the chest was performed using the standard protocol during bolus administration of intravenous contrast. Multiplanar CT image reconstructions and MIPs were obtained to evaluate the vascular anatomy. CONTRAST:  10/23/2020 OMNIPAQUE IOHEXOL 350 MG/ML SOLN COMPARISON:  Chest x-ray from earlier in the same day. FINDINGS: Cardiovascular: Atherosclerotic calcifications of the thoracic aorta and its branches are noted. No aneurysmal dilatation or dissection is noted. Heart is at the upper limits of normal in size. Coronary calcifications are noted. Soft noncalcified atherosclerotic plaque is noted in the descending thoracic aorta. The pulmonary artery shows a normal branching pattern bilaterally. No filling defect is noted to correspond with pulmonary emboli. Mediastinum/Nodes: Thoracic inlet is within normal limits. No sizable hilar or mediastinal adenopathy is noted. The esophagus visualized is within limits. Lungs/Pleura: Mild emphysematous changes are noted. No focal infiltrate or sizable effusion is seen. No pneumothorax is noted. Upper Abdomen: Visualized upper abdomen shows multiple cystic structures within the liver. Right renal cyst is noted as well. Musculoskeletal: Degenerative changes of the thoracic spine are noted. 15 mm rounded density is noted in the right breast best seen on image number 104 of series 5. This may simply represent a cyst but is incompletely evaluated on this exam. Review of the MIP images confirms the above findings. IMPRESSION: No evidence of pulmonary emboli. Scattered hepatic and renal cysts. Rounded density of the right breast which may represent a cyst. The need for further evaluation can be determined on  a clinical basis given the patient's  age. No prior exam is available for comparison. Aortic Atherosclerosis (ICD10-I70.0) and Emphysema (ICD10-J43.9). Electronically Signed   By: Alcide Clever M.D.   On: 08/23/2020 16:43   DG CHEST PORT 1 VIEW  Result Date: 08/24/2020 CLINICAL DATA:  87 year old female with shortness of breath and fever. Negative for COVID-19 three days ago. EXAM: PORTABLE CHEST 1 VIEW COMPARISON:  CTA chest 08/23/2020 and earlier. FINDINGS: Portable AP semi upright view at 0440 hours. Stable lung volumes and mediastinal contours. Visualized tracheal air column is within normal limits. Allowing for portable technique the lungs are clear. No pneumothorax. No acute osseous abnormality identified. Paucity of bowel gas in the upper abdomen. IMPRESSION: No acute cardiopulmonary abnormality. Electronically Signed   By: Odessa Fleming M.D.   On: 08/24/2020 05:12   DG CHEST PORT 1 VIEW  Result Date: 08/23/2020 CLINICAL DATA:  87 year old female with fever. Negative for COVID-19 2 days ago. EXAM: PORTABLE CHEST 1 VIEW COMPARISON:  Portable chest 08/21/2020 and earlier. FINDINGS: Portable AP semi upright view at 0449 hours. Lung volumes are stable and at the upper limits of normal. Stable cardiac size and mediastinal contours. Cardiac size at the upper limits of normal. Visualized tracheal air column is within normal limits. Allowing for portable technique the lungs are clear. No pneumothorax or pleural effusion. No acute osseous abnormality identified. IMPRESSION: Stable.  No acute cardiopulmonary abnormality. Electronically Signed   By: Odessa Fleming M.D.   On: 08/23/2020 05:38   Scheduled Meds: . amLODipine  10 mg Oral Daily  . azithromycin  500 mg Oral Daily  . budesonide (PULMICORT) nebulizer solution  0.5 mg Nebulization BID  . clopidogrel  75 mg Oral Daily  . enoxaparin (LOVENOX) injection  30 mg Subcutaneous Q24H  . guaiFENesin  1,200 mg Oral BID  . ipratropium  0.5 mg Nebulization Q6H  . levalbuterol  0.63 mg Nebulization Q6H  .  methylPREDNISolone (SOLU-MEDROL) injection  60 mg Intravenous Q12H  . pantoprazole  40 mg Oral BID  . pravastatin  10 mg Oral q1800   Continuous Infusions:   LOS: 1 day   Merlene Laughter, DO [facility] PAGER is on AMION  If 7PM-7AM, please contact night-coverage [email or web address]

## 2020-08-25 ENCOUNTER — Inpatient Hospital Stay (HOSPITAL_COMMUNITY): Payer: Medicare Other

## 2020-08-25 DIAGNOSIS — J209 Acute bronchitis, unspecified: Secondary | ICD-10-CM

## 2020-08-25 LAB — COMPREHENSIVE METABOLIC PANEL
ALT: 25 U/L (ref 0–44)
AST: 28 U/L (ref 15–41)
Albumin: 3.6 g/dL (ref 3.5–5.0)
Alkaline Phosphatase: 51 U/L (ref 38–126)
Anion gap: 11 (ref 5–15)
BUN: 32 mg/dL — ABNORMAL HIGH (ref 8–23)
CO2: 25 mmol/L (ref 22–32)
Calcium: 9 mg/dL (ref 8.9–10.3)
Chloride: 100 mmol/L (ref 98–111)
Creatinine, Ser: 1.24 mg/dL — ABNORMAL HIGH (ref 0.44–1.00)
GFR, Estimated: 41 mL/min — ABNORMAL LOW (ref >60)
Glucose, Bld: 186 mg/dL — ABNORMAL HIGH (ref 70–99)
Potassium: 3.9 mmol/L (ref 3.5–5.1)
Sodium: 136 mmol/L (ref 135–145)
Total Bilirubin: 0.8 mg/dL (ref 0.3–1.2)
Total Protein: 6.7 g/dL (ref 6.5–8.1)

## 2020-08-25 LAB — CBC WITH DIFFERENTIAL/PLATELET
Abs Immature Granulocytes: 0.06 10*3/uL (ref 0.00–0.07)
Basophils Absolute: 0 10*3/uL (ref 0.0–0.1)
Basophils Relative: 0 %
Eosinophils Absolute: 0.1 10*3/uL (ref 0.0–0.5)
Eosinophils Relative: 2 %
HCT: 45.6 % (ref 36.0–46.0)
Hemoglobin: 15 g/dL (ref 12.0–15.0)
Immature Granulocytes: 1 %
Lymphocytes Relative: 17 %
Lymphs Abs: 1.2 10*3/uL (ref 0.7–4.0)
MCH: 29.6 pg (ref 26.0–34.0)
MCHC: 32.9 g/dL (ref 30.0–36.0)
MCV: 90.1 fL (ref 80.0–100.0)
Monocytes Absolute: 0.1 10*3/uL (ref 0.1–1.0)
Monocytes Relative: 2 %
Neutro Abs: 5.5 10*3/uL (ref 1.7–7.7)
Neutrophils Relative %: 78 %
Platelets: 235 10*3/uL (ref 150–400)
RBC: 5.06 MIL/uL (ref 3.87–5.11)
RDW: 13.8 % (ref 11.5–15.5)
WBC: 7 10*3/uL (ref 4.0–10.5)
nRBC: 0 % (ref 0.0–0.2)

## 2020-08-25 LAB — MAGNESIUM: Magnesium: 2.1 mg/dL (ref 1.7–2.4)

## 2020-08-25 LAB — PHOSPHORUS: Phosphorus: 3.4 mg/dL (ref 2.5–4.6)

## 2020-08-25 MED ORDER — LEVALBUTEROL HCL 0.63 MG/3ML IN NEBU
0.6300 mg | INHALATION_SOLUTION | Freq: Three times a day (TID) | RESPIRATORY_TRACT | Status: DC
  Administered 2020-08-25 – 2020-08-28 (×10): 0.63 mg via RESPIRATORY_TRACT
  Filled 2020-08-25 (×10): qty 3

## 2020-08-25 MED ORDER — IPRATROPIUM BROMIDE 0.02 % IN SOLN
0.5000 mg | Freq: Three times a day (TID) | RESPIRATORY_TRACT | Status: DC
  Administered 2020-08-25 – 2020-08-28 (×10): 0.5 mg via RESPIRATORY_TRACT
  Filled 2020-08-25 (×10): qty 2.5

## 2020-08-25 MED ORDER — CHLORHEXIDINE GLUCONATE CLOTH 2 % EX PADS
6.0000 | MEDICATED_PAD | Freq: Every day | CUTANEOUS | Status: DC
  Administered 2020-08-26: 6 via TOPICAL

## 2020-08-25 MED ORDER — MUPIROCIN 2 % EX OINT
1.0000 "application " | TOPICAL_OINTMENT | Freq: Two times a day (BID) | CUTANEOUS | Status: DC
  Administered 2020-08-25 – 2020-08-28 (×7): 1 via NASAL
  Filled 2020-08-25: qty 22

## 2020-08-25 NOTE — Progress Notes (Signed)
PROGRESS NOTE    Alfonse Flavorsauline C Martinezgarcia  GEX:[id number] DOB: 05/18/1926 DOA: 08/21/2020 PCP: Pcp, No   Brief Narrative:  Patient is a 87 year old overweight Caucasian female with a past medical history significant for but not limited to hypertension, hyperlipidemia, history of CVA, history of atherosclerotic PVD with ulceration status post Left BKA, constipation, macular degeneration, as well as other comorbidities who presented with a chief complaint of shortness of breath.  She complained of increasing shortness of breath which started on Sunday, 08/17/2020 and this was associated with nonproductive cough general body aches.  Some of the history was  obtained from her daughter who was at bedside and patient had a febrile 100.6 at her ALF.  At the facility she is given acetaminophen prior to arrival and had reportedly a sick contact in the facility.  Patient was noted to have shortness of breath with audible wheezing and given breathing treatments without improvement so EMS was called and she was brought to the ED for further evaluation.  She was given a nebulized breathing treatment in the ED and evaluated and was found to be tachypneic and tachycardic.  Chest x-ray showed no acute pulmonary disease but she is given a dose of Solu-Medrol, duo nebs and IV ceftriaxone.  She is also given a liter normal saline and Hospitalist was consulted for further admission. Of Note her influenza a and B and SARS-CoV-2 testing was negative.  Respiratory status is improving but still not back to baseline.  Patient continues to significantly wheeze.  She is now having some delirium so she will be placed on delirium precautions.  Of note she was unable to sleep last night so she was given Ambien and a daughter states that it had an resting reaction so we will not give any more Ambien.  She continues to wheeze significantly but is improving minimally.  Refused her Solu-Medrol the night before last because of her intermittent delirium.   Because of lack of improvement will reach out to pulmonary and respiratory virus panel done and POSITIVE for Metapneumovirus.  Pulmonary evaluated and recommending continue treatment with IV Solu-Medrol 60 mg daily: Continue Pulmicort twice a day as well as Xopenex and Atrovent nebs.  31 evaluate upper airway total swallowing test and recommending considering an ENT evaluation if bronchospasm persists  Assessment & Plan:   Principal Problem:   Acute bronchitis Active Problems:   PAD (peripheral artery disease) (HCC)   S/P below knee amputation, left (HCC)   Essential hypertension   Stage 3 chronic kidney disease (HCC)   History of stroke   Hyperlipidemia  Dyspnea and shortness of breath in the setting of likely acute bronchitis due to metapneumovirus and suspected COPD exacerbation, persistent and mildly improving -She was given breathing treatments at the ALF and with EMS without relief -She was placed in observation on MedSurg and initiated on Xopenex every 6-every 4 as needed monitor and will add Atrovent every 6 scheduled; have now added budesonide 0.5 mg p.o. twice daily -Incentive spirometry and flutter valve -Continue with Solu-Medrol 60 mg every 12 scheduled and pulmonary agrees with this -We will continue azithromycin but increase the dose from 250 mg po Daily to 500 mg 3 times daily.  She received a dose of IV ceftriaxone and azithromycin in the ED -Will add scheduled budesonide and Brovana I will continue -Changed to Guaifenesin 1200 mg po BID -Still has not received her flutter valve or incentive spirometry -Continue to Monitor Respiratory Status Carefully -SpO2: 95 % O2 Flow Rate (  L/min): 1.5 L/min; she was placed on oxygen for a O2 saturation 91% -Given her persistent wheezing we will check a respiratory virus panel and this was positive for metapneumovirus.  Initially placed on droplet precautions but can be discontinued; of note she is negative for COVID and influenza A and  B -Continuous pulse oximetry maintain O2 saturations greater than 90% -Has now resumed her PPI but now made it twice daily -D-dimer is elevated to 1.15 so obtain a CTA PE protocol -CTA showed "No evidence of pulmonary emboli. Scattered hepatic and renal cysts. Rounded density of the right breast which may represent a cyst. The need for further evaluation can be determined on a clinical basis given the patient's age. No prior exam is available for comparison. Aortic Atherosclerosis and Emphysema." -Given a Dose of IV Lasix 40 mg x1 -Repeat CXR in the AM; repeat chest x-ray this morning showed "Minimal bibasilar atelectasis.Aortic Atherosclerosis." -Pulmonary was consulted for further evaluation recommendations and appreciate Dr. Reginia Naas input.  Dr. Vassie Loll recommends continuing IV Solu-Medrol 60 mg every 12 scheduled, Xopenex and Atrovent as well as budesonide and recommends evaluating upper airway and recommending a swallow test and recommends considering ENT evaluation if bronchospasm persists.  CKD stage IIIb Metabolic Acidosis  -BUNs/creatinine 21/1.19 and repeat BUN/creatinine was 20/1.26 yesterday and today is 29/1.27 -> 31/1.21 and today it is 32/1.24 with a  GFR of 41 -Given a Liter of normal saline bolus in the ED and was placed on IV fluid maintenance but this was stopped and she was given a dose of IV Lasix yesterday -Patient had a Metabolic Acidosis with a CO2 of 18, anion gap of 13, chloride level 105; now her CO2 is 25, anion gap of 11, chloride level is 100 -Avoid nephrotoxic medications, contrast dyes, hypotension and renally adjust medications -Continue to monitor and trend renal function and repeat CMP in a.m.  Hyperlipidemia -Continue with Pravastatin 10 mg po Daily  History of CVA PAD -Continue with Clopidogrel 75 mg p.o. daily as well as Pravastatin 10 mg po Daily  HTN -C/w Amlodipine 10 mg po Daily  -Continue to Monitor BP per Protocol -Last BP Was 153/104  Insomnia -In  the setting of steroids -Per Nursing she did not sleep on Night shift at all and was restless, tossing, and turning,  -We will not give any more zolpidem and discontinue; may need some Benadryl low-dose 25 mg nightly  Delirium ? Dementia with Sundowning, improving  -Easily reoriented but has had multiple changes the day before yesterday at ongoing to 3 different floors. Became agitated overnight. -Placed on delirium precautions -Daughter states that she was perseverating on things that she should not be but this is improved -Continue to Monitor closely   Hypophosphatemia -Patients Phos Level was 3.4 now -Continue to Monitor and Trend -Repeat Phos Level in the AM    DVT prophylaxis: Enoxaparin 40 mg sq q24h Code Status: FULL CODE Family Communication: Discussed with Daughter at bedside  Disposition Plan: Pending further clinical Improvement in Respiratory Status and clearance by Pulmonary   Status is: Inpatient  Remains inpatient appropriate because:Unsafe d/c plan, IV treatments appropriate due to intensity of illness or inability to take PO and Inpatient level of care appropriate due to severity of illness   Dispo: The patient is from: Home              Anticipated d/c is to: Home              Patient currently is not  medically stable to d/c.   Difficult to place patient No  Consultants:   Pulmonary Dr. Vassie Loll    Procedures: None  Antimicrobials:  Anti-infectives (From admission, onward)   Start     Dose/Rate Route Frequency Ordered Stop   08/23/20 1000  azithromycin (ZITHROMAX) tablet 250 mg  Status:  Discontinued       "Followed by" Linked Group Details   250 mg Oral Daily 08/21/20 2348 08/23/20 0930   08/23/20 1000  azithromycin (ZITHROMAX) tablet 500 mg       "Followed by" Linked Group Details   500 mg Oral Daily 08/23/20 0930 08/27/20 0959   08/22/20 1000  azithromycin (ZITHROMAX) tablet 500 mg       "Followed by" Linked Group Details   500 mg Oral Daily 08/21/20  2348 08/22/20 0840   08/21/20 2045  cefTRIAXone (ROCEPHIN) 1 g in sodium chloride 0.9 % 100 mL IVPB        1 g 200 mL/hr over 30 Minutes Intravenous  Once 08/21/20 2040 08/21/20 2219        Subjective: Seen and examined at bedside and thinks she may be doing a little bit better today.  Cough is better and thinks her wheezing is mildly improved.  Denies any chest pain, lightheadedness or dizziness.  No other concerns or complaints at this time.  Objective: Vitals:   08/24/20 2014 08/25/20 0444 08/25/20 0759 08/25/20 0924  BP:  132/76  (!) 142/79  Pulse:  79  84  Resp:  20    Temp:  98.3 F (36.8 C)    TempSrc:      SpO2: 95% 90% 95% 95%  Weight:      Height:        Intake/Output Summary (Last 24 hours) at 08/25/2020 1037 Last data filed at 08/25/2020 0900 Gross per 24 hour  Intake 960 ml  Output 2000 ml  Net -1040 ml   Filed Weights   08/21/20 1957  Weight: 71.2 kg   Examination: Physical Exam:  Constitutional: WN/WD overweight elderly Caucasian female, in mild respiratory distress and is continue to wheeze but not bad.  She appears calm and comfortable Eyes: Lids and conjunctivae normal, sclerae anicteric  ENMT: External Ears, Nose appear normal. Grossly normal hearing.  Neck: Appears normal, supple, no cervical masses, normal ROM, no appreciable thyromegaly; no JVD Respiratory: Diminished to auscultation bilaterally with coarse breath sounds and significant expiratory wheezing no appreciable rales, rhonchi or crackles. She has a slightly increased respiratory effort and is wearing 2 L of supplemental oxygen via nasal cannula. Cardiovascular: RRR, no murmurs / rubs / gallops. S1 and S2 auscultated. Minimal Right Leg edema Abdomen: Soft, non-tender, Distended 2/2 body habitus. Bowel sounds positive.  GU: Deferred. Musculoskeletal: No clubbing / cyanosis of digits/nails. Left BKA Skin: No rashes, lesions, ulcers on a limited skin evaluation. No induration; Warm and dry.   Neurologic: CN 2-12 grossly intact with no focal deficits. Romberg sign and cerebellar reflexes not assessed.  Psychiatric: Normal judgment and insight. Alert and oriented x 3. Normal mood and appropriate affect.   Data Reviewed: I have personally reviewed following labs and imaging studies  CBC: Recent Labs  Lab 08/21/20 2059 08/22/20 0556 08/23/20 0646 08/24/20 0343  WBC 6.3 5.0 10.0 9.5  NEUTROABS 3.9  --  7.9* 7.3  HGB 14.9 14.8 14.0 14.0  HCT 46.2* 45.2 42.3 43.5  MCV 91.7 90.9 91.0 91.8  PLT 229 213 223 231   Basic Metabolic Panel: Recent Labs  Lab  08/21/20 2059 08/22/20 0433 08/23/20 0646 08/24/20 0343 08/25/20 0452  NA 135 136 136 138 136  K 4.1 4.3 3.9 4.4 3.9  CL 99 105 103 105 100  CO2 26 18* GLUCOSE 118* 210* 165* 141* 186*  BUN 21 20 29* 31* 32*  CREATININE 1.19* 1.26* 1.27* 1.21* 1.24*  CALCIUM 9.3 8.9 8.7* 8.8* 9.0  MG  --  1.9 1.9 2.2 2.1  PHOS  --  2.3* 3.3 3.3 3.4   GFR: Estimated Creatinine Clearance: 27.3 mL/min (A) (by C-G formula based on SCr of 1.24 mg/dL (H)). Liver Function Tests: Recent Labs  Lab 08/21/20 2059 08/22/20 0433 08/23/20 0646 08/24/20 0343 08/25/20 0452  AST 23 29 40 40 28  ALT ALKPHOS 64 58 52 49 51  BILITOT 0.6 1.2 0.6 0.6 0.8  PROT 7.1 6.5 6.1* 6.2* 6.7  ALBUMIN 3.9 3.5 3.4* 3.4* 3.6   No results for input(s): LIPASE, AMYLASE in the last 168 hours. No results for input(s): AMMONIA in the last 168 hours. Coagulation Profile: Recent Labs  Lab 08/22/20 0433  INR 1.0   Cardiac Enzymes: No results for input(s): CKTOTAL, CKMB, CKMBINDEX, TROPONINI in the last 168 hours. BNP (last 3 results) No results for input(s): PROBNP in the last 8760 hours. HbA1C: No results for input(s): HGBA1C in the last 72 hours. CBG: No results for input(s): GLUCAP in the last 168 hours. Lipid Profile: No results for input(s): CHOL, HDL, LDLCALC, TRIG, CHOLHDL, LDLDIRECT in the last 72 hours. Thyroid  Function Tests: No results for input(s): TSH, T4TOTAL, FREET4, T3FREE, THYROIDAB in the last 72 hours. Anemia Panel: No results for input(s): VITAMINB12, FOLATE, FERRITIN, TIBC, IRON, RETICCTPCT in the last 72 hours. Sepsis Labs: Recent Labs  Lab 08/21/20 2059 08/21/20 2224  LATICACIDVEN 1.1 1.3    Recent Results (from the past 240 hour(s))  Resp Panel by RT-PCR (Flu A&B, Covid) Nasopharyngeal Swab     Status: None   Collection Time: 08/21/20  8:03 PM   Specimen: Nasopharyngeal Swab; Nasopharyngeal(NP) swabs in vial transport medium  Result Value Ref Range Status   SARS Coronavirus 2 by RT PCR NEGATIVE NEGATIVE Final    Comment: (NOTE) SARS-CoV-2 target nucleic acids are NOT DETECTED.  The SARS-CoV-2 RNA is generally detectable in upper respiratory specimens during the acute phase of infection. The lowest concentration of SARS-CoV-2 viral copies this assay can detect is 138 copies/mL. A negative result does not preclude SARS-Cov-2 infection and should not be used as the sole basis for treatment or other patient management decisions. A negative result may occur with  improper specimen collection/handling, submission of specimen other than nasopharyngeal swab, presence of viral mutation(s) within the areas targeted by this assay, and inadequate number of viral copies(<138 copies/mL). A negative result must be combined with clinical observations, patient history, and epidemiological information. The expected result is Negative.  Fact Sheet for Patients:  BloggerCourse.com  Fact Sheet for Healthcare Providers:  SeriousBroker.it  This test is no t yet approved or cleared by the [place] FDA and  has been authorized for detection and/or diagnosis of SARS-CoV-2 by FDA under an Emergency Use Authorization (EUA). This EUA will remain  in effect (meaning this test can be used) for the duration of the COVID-19 declaration under  Section 564(b)(1) of the Act, 21 U.S.C.section 360bbb-3(b)(1), unless the authorization is terminated  or revoked sooner.       Influenza A by PCR NEGATIVE NEGATIVE Final  Influenza B by PCR NEGATIVE NEGATIVE Final    Comment: (NOTE) The Xpert Xpress SARS-CoV-2/FLU/RSV plus assay is intended as an aid in the diagnosis of influenza from Nasopharyngeal swab specimens and should not be used as a sole basis for treatment. Nasal washings and aspirates are unacceptable for Xpert Xpress SARS-CoV-2/FLU/RSV testing.  Fact Sheet for Patients: BloggerCourse.com  Fact Sheet for Healthcare Providers: SeriousBroker.it  This test is not yet approved or cleared by the [place] FDA and has been authorized for detection and/or diagnosis of SARS-CoV-2 by FDA under an Emergency Use Authorization (EUA). This EUA will remain in effect (meaning this test can be used) for the duration of the COVID-19 declaration under Section 564(b)(1) of the Act, 21 U.S.C. section 360bbb-3(b)(1), unless the authorization is terminated or revoked.  Performed at [facility], [address] [place], [place] [address]   Blood culture (routine x 2)     Status: None (Preliminary result)   Collection Time: 08/21/20  8:59 PM   Specimen: BLOOD  Result Value Ref Range Status   Specimen Description BLOOD LEFT ANTECUBITAL  Final   Special Requests   Final    BOTTLES DRAWN AEROBIC AND ANAEROBIC Blood Culture adequate volume   Culture   Final    NO GROWTH 4 DAYS Performed at [facility], [address] [place], [place] [address]    Report Status PENDING  Incomplete  Blood culture (routine x 2)     Status: None (Preliminary result)   Collection Time: 08/21/20  8:59 PM   Specimen: BLOOD RIGHT HAND  Result Value Ref Range Status   Specimen Description BLOOD RIGHT HAND  Final   Special Requests   Final    BOTTLES DRAWN AEROBIC AND ANAEROBIC Blood Culture adequate  volume   Culture   Final    NO GROWTH 4 DAYS Performed at [facility], [address] [place], [place] [address]    Report Status PENDING  Incomplete  Respiratory (~20 pathogens) panel by PCR     Status: Abnormal   Collection Time: 08/24/20 11:07 AM   Specimen: Nasopharyngeal Swab; Respiratory  Result Value Ref Range Status   Adenovirus NOT DETECTED NOT DETECTED Final   Coronavirus 229E NOT DETECTED NOT DETECTED Final    Comment: (NOTE) The Coronavirus on the Respiratory Panel, DOES NOT test for the novel  Coronavirus (2019 nCoV)    Coronavirus HKU1 NOT DETECTED NOT DETECTED Final   Coronavirus NL63 NOT DETECTED NOT DETECTED Final   Coronavirus OC43 NOT DETECTED NOT DETECTED Final   Metapneumovirus DETECTED (A) NOT DETECTED Final   Rhinovirus / Enterovirus NOT DETECTED NOT DETECTED Final   Influenza A NOT DETECTED NOT DETECTED Final   Influenza B NOT DETECTED NOT DETECTED Final   Parainfluenza Virus 1 NOT DETECTED NOT DETECTED Final   Parainfluenza Virus 2 NOT DETECTED NOT DETECTED Final   Parainfluenza Virus 3 NOT DETECTED NOT DETECTED Final   Parainfluenza Virus 4 NOT DETECTED NOT DETECTED Final   Respiratory Syncytial Virus NOT DETECTED NOT DETECTED Final   Bordetella pertussis NOT DETECTED NOT DETECTED Final   Bordetella Parapertussis NOT DETECTED NOT DETECTED Final   Chlamydophila pneumoniae NOT DETECTED NOT DETECTED Final   Mycoplasma pneumoniae NOT DETECTED NOT DETECTED Final    Comment: Performed at [facility], [address], [place], [place] [address]    RN Pressure Injury Documentation: Pressure Injury 07/10/17 serous filled blister (Active)  07/10/17 1845  Location: Heel  Location Orientation: Right;Distal  Staging:  Wound Description (Comments): serous filled blister  Present on Admission: Yes    Estimated body mass index is 28.71 kg/m as calculated from the following:   Height as of this encounter:  (1.575 m).   Weight as of this encounter:  71.2 kg.  Malnutrition Type:   Malnutrition Characteristics:   Nutrition Interventions:    Radiology Studies: CT ANGIO CHEST PE W OR WO CONTRAST  Result Date: 08/23/2020 CLINICAL DATA:  Respiratory distress EXAM: CT ANGIOGRAPHY CHEST WITH CONTRAST TECHNIQUE: Multidetector CT imaging of the chest was performed using the standard protocol during bolus administration of intravenous contrast. Multiplanar CT image reconstructions and MIPs were obtained to evaluate the vascular anatomy. CONTRAST:  OMNIPAQUE IOHEXOL 350 MG/ML SOLN COMPARISON:  Chest x-ray from earlier in the same day. FINDINGS: Cardiovascular: Atherosclerotic calcifications of the thoracic aorta and its branches are noted. No aneurysmal dilatation or dissection is noted. Heart is at the upper limits of normal in size. Coronary calcifications are noted. Soft noncalcified atherosclerotic plaque is noted in the descending thoracic aorta. The pulmonary artery shows a normal branching pattern bilaterally. No filling defect is noted to correspond with pulmonary emboli. Mediastinum/Nodes: Thoracic inlet is within normal limits. No sizable hilar or mediastinal adenopathy is noted. The esophagus visualized is within limits. Lungs/Pleura: Mild emphysematous changes are noted. No focal infiltrate or sizable effusion is seen. No pneumothorax is noted. Upper Abdomen: Visualized upper abdomen shows multiple cystic structures within the liver. Right renal cyst is noted as well. Musculoskeletal: Degenerative changes of the thoracic spine are noted. 15 mm rounded density is noted in the right breast best seen on image number 104 of series 5. This may simply represent a cyst but is incompletely evaluated on this exam. Review of the MIP images confirms the above findings. IMPRESSION: No evidence of pulmonary emboli. Scattered hepatic and renal cysts. Rounded density of the right breast which may represent a cyst. The need for further evaluation can be  determined on a clinical basis given the patient's age. No prior exam is available for comparison. Aortic Atherosclerosis (ICD10-I70.0) and Emphysema (ICD10-J43.9). Electronically Signed   By: Alcide Clever M.D.   On: 08/23/2020 16:43   DG CHEST PORT 1 VIEW  Result Date: 08/25/2020 CLINICAL DATA:  Shortness of breath, fever, history hypertension, diabetes mellitus, pneumonia EXAM: PORTABLE CHEST 1 VIEW COMPARISON:  Portable exam 0457 hours compared to 08/24/2020 FINDINGS: Normal heart size, mediastinal contours, and pulmonary vascularity. Atherosclerotic calcification aorta. Minimal bibasilar atelectasis. Lungs otherwise clear. No acute infiltrate, pleural effusion, or pneumothorax. Bones demineralized. IMPRESSION: Minimal bibasilar atelectasis. Aortic Atherosclerosis (ICD10-I70.0). Electronically Signed   By: Ulyses Southward M.D.   On: 08/25/2020 08:09   DG CHEST PORT 1 VIEW  Result Date: 08/24/2020 CLINICAL DATA:  87 year old female with shortness of breath and fever. Negative for COVID-19 three days ago. EXAM: PORTABLE CHEST 1 VIEW COMPARISON:  CTA chest 08/23/2020 and earlier. FINDINGS: Portable AP semi upright view at 0440 hours. Stable lung volumes and mediastinal contours. Visualized tracheal air column is within normal limits. Allowing for portable technique the lungs are clear. No pneumothorax. No acute osseous abnormality identified. Paucity of bowel gas in the upper abdomen. IMPRESSION: No acute cardiopulmonary abnormality. Electronically Signed   By: Odessa Fleming M.D.   On: 08/24/2020 05:12   Scheduled Meds: . amLODipine  10 mg Oral Daily  . azithromycin  500 mg Oral Daily  . budesonide (PULMICORT) nebulizer solution  0.5 mg Nebulization BID  . Chlorhexidine Gluconate Cloth  6 each Topical Q0600  . clopidogrel  75 mg Oral Daily  . enoxaparin (LOVENOX) injection  30 mg Subcutaneous Q24H  . guaiFENesin  1,200 mg Oral BID  . ipratropium  0.5 mg Nebulization TID  . levalbuterol  0.63 mg Nebulization  TID  . methylPREDNISolone (SOLU-MEDROL) injection  60 mg Intravenous Q12H  . mupirocin ointment  1 application Nasal BID  . pantoprazole  40 mg Oral BID  . pravastatin  10 mg Oral q1800   Continuous Infusions:   LOS: 2 days   Merlene Laughter, DO [facility] PAGER is on AMION  If 7PM-7AM, please contact night-coverage [email or web address]

## 2020-08-25 NOTE — Evaluation (Signed)
Clinical/Bedside Swallow Evaluation Patient Details  Name: [name] Petersen MRN: [id number] Date of Birth: Jul 05, 1926  Today's Date: 08/25/2020 Time: SLP Start Time (ACUTE ONLY): 1400 SLP Stop Time (ACUTE ONLY): 1424 SLP Time Calculation (min) (ACUTE ONLY): 24 min  Past Medical History:  Past Medical History:  Diagnosis Date  . Arthritis    hands  . Atherosclerotic PVD with ulceration (HCC) 07/15/2017   RIGHT HEEL  . Complication of anesthesia    lost eyesight for a few days after surgery in the 1950's  . Constipation   . Diabetes mellitus without complication (HCC)    no medications   . Hypertension   . Macular degeneration, bilateral    "one eye wet; one eye dry; she gets shots" (07/07/2017)  . PAD (peripheral artery disease) (HCC)   . Pneumonia   . Stroke [facility])    some trouble swallowing   Past Surgical History:  Past Surgical History:  Procedure Laterality Date  . ABDOMINAL AORTOGRAM W/LOWER EXTREMITY N/A 06/16/2017   Procedure: ABDOMINAL AORTOGRAM W/LOWER EXTREMITY;  Surgeon: Fransisco Hertz, MD;  Location: [facility] INVASIVE CV LAB;  Service: Cardiovascular;  Laterality: N/A;  lt extermity  . ABDOMINAL HYSTERECTOMY    . AMPUTATION Left 07/06/2017   Procedure: AMPUTATION BELOW KNEE LEFT;  Surgeon: Larina Earthly, MD;  Location: [facility] OR;  Service: Vascular;  Laterality: Left;  . CATARACT EXTRACTION W/ INTRAOCULAR LENS  IMPLANT, BILATERAL    . DILATION AND CURETTAGE OF UTERUS    . LOWER EXTREMITY ANGIOGRAPHY N/A 07/14/2017   Procedure: LOWER EXTREMITY ANGIOGRAPHY;  Surgeon: Chuck Hint, MD;  Location: [facility] INVASIVE CV LAB;  Service: Cardiovascular;  Laterality: N/A;   HPI:  87 year old with left BKA, nursing home resident brought in by EMS on 5/5 for shortness of breath that started 5/1 with nonproductive cough, low-grade fever 100.6.  She received nebulizer by EMS and in the ED and admitted.  Influenza and COVID testing was negative, chest x-ray was clear.  She was treated with  Solu-Medrol, azithromycin, Xopenex and Atrovent nebs but wheezing has been persistent  D-dimer was noted to be elevated and CT angiogram chest was obtained which was negative for PE. Hospital course was complicated by transient delirium and mild hypertension. She seems to have significant upper airway pseudo wheeze that is being transmitted to the lower airways.  Does not appear to have any kind of respiratory distress  BSE requested.   Assessment / Plan / Recommendation Clinical Impression  Clinical swallow evaluation completed at bedside after Pt awakened. Pt with audible wheeze before, during, and after po trials. She indicates that she had shingles two weeks ago and then developed cold like symptoms and bronchitis. She also indicates that she had a stroke two years ago and has had to make sure that she takes small sips since that time. Previous SLP records were not located in [place]. Oral motor examination is WNL. Pt does not show any overt signs or symptoms of aspiration during po intake (except for wheeze which was already present). Recommend continuing diet as ordered (regular and thin) with standard aspiration and reflux precautions, Pt to take small sips. No further SLP services indicated unless MD would like MBSS. SLP will sign off.   SLP Visit Diagnosis: Dysphagia, unspecified (R13.10)    Aspiration Risk  Mild aspiration risk    Diet Recommendation Regular;Thin liquid   Liquid Administration via: Cup;Straw Medication Administration: Whole meds with liquid Supervision: Patient able to self feed Compensations: Small sips/bites Postural Changes:  Seated upright at 90 degrees;Remain upright for at least 30 minutes after po intake    Other  Recommendations Oral Care Recommendations: Oral care BID Other Recommendations: Clarify dietary restrictions   Follow up Recommendations None      Frequency and Duration            Prognosis Prognosis for Safe Diet Advancement: Good       Swallow Study   General Date of Onset: 08/21/20 HPI: 87 year old with left BKA, nursing home resident brought in by EMS on 5/5 for shortness of breath that started 5/1 with nonproductive cough, low-grade fever 100.6.  She received nebulizer by EMS and in the ED and admitted.  Influenza and COVID testing was negative, chest x-ray was clear.  She was treated with Solu-Medrol, azithromycin, Xopenex and Atrovent nebs but wheezing has been persistent  D-dimer was noted to be elevated and CT angiogram chest was obtained which was negative for PE. Hospital course was complicated by transient delirium and mild hypertension. She seems to have significant upper airway pseudo wheeze that is being transmitted to the lower airways.  Does not appear to have any kind of respiratory distress  BSE requested. Type of Study: Bedside Swallow Evaluation Previous Swallow Assessment: N/A Diet Prior to this Study: Regular;Thin liquids Temperature Spikes Noted: No Respiratory Status: Room air History of Recent Intubation: No Behavior/Cognition: Alert;Cooperative;Pleasant mood Oral Cavity Assessment: Within Functional Limits Oral Care Completed by SLP: No Oral Cavity - Dentition:  (missing some) Vision: Functional for self-feeding Self-Feeding Abilities: Able to feed self Patient Positioning: Upright in bed Baseline Vocal Quality: Normal (audible wheeze) Volitional Cough: Strong Volitional Swallow: Able to elicit    Oral/Motor/Sensory Function Overall Oral Motor/Sensory Function: Within functional limits   Ice Chips Ice chips: Within functional limits Presentation: Spoon   Thin Liquid Thin Liquid: Within functional limits Presentation: Cup;Self Fed;Straw    Nectar Thick Nectar Thick Liquid: Not tested   Honey Thick Honey Thick Liquid: Not tested   Puree Puree: Within functional limits Presentation: Spoon   Solid     Solid: Within functional limits Presentation: Self Fed     Thank you,  Havery Moros, CCC-SLP [phone]  Khai Torbert 08/25/2020,2:24 PM

## 2020-08-25 NOTE — Progress Notes (Signed)
Physical Therapy Treatment Patient Details Name: [name] Petersen MRN: [id number] DOB: December 01, 1926 Today's Date: 08/25/2020    History of Present Illness [name] Petersen is a 87 y.o. female with medical history significant for hypertension, hyperlipidemia, history of stroke who presents to the emergency department via EMS from [facility] due to shortness of breath.  Patient complained of increasing shortness of breath which started on Sunday (5/1), this was associated with a nonproductive cough and generalized body aches.  Some of the history was obtained from daughter at bedside, per daughter, patient had a fever of 100.6F at the living facility and Tylenol was given prior to arrival, patient was also reported to have had sick contact at the living facility.  Shortness of breath was seen with audible wheezing sound, breathing treatment was given at the living facility without improvement, so EMS was activated, on arrival of EMS team, 1 nebulizer treatment was also provided and patient was taken to the ED for further evaluation and management.  Patient states that she already had 2 COVID-19 vaccines and a booster.    PT Comments    Patient demonstrates slow labored movement for sitting up at bedside requiring use of bed rail and Min assist to sit up, demonstrates fair/good return for completing BLE ROM/strengthening exercises while seated at bedside, able to stand pivot transfer to chair using armrest and tolerated standing with RW for up to 2-3 minutes before having to sit due to fatigue.  Patient tolerated sitting up in chair after therapy with her daughter present in room.  Patient will benefit from continued physical therapy in hospital and recommended venue below to increase strength, balance, endurance for safe ADLs and gait.      Follow Up Recommendations  Home health PT;Supervision for mobility/OOB;Supervision - Intermittent     Equipment Recommendations  None recommended by PT     Recommendations for Other Services       Precautions / Restrictions Precautions Precautions: Fall Precaution Comments: Left BKA Restrictions Weight Bearing Restrictions: No    Mobility  Bed Mobility Overal bed mobility: Needs Assistance Bed Mobility: Supine to Sit     Supine to sit: Min assist     General bed mobility comments: required use of bed rail and min assist to complete supine to sitting    Transfers Overall transfer level: Needs assistance Equipment used: Rolling walker (2 wheeled);1 person hand held assist;None Transfers: Sit to/from [employer] Sit to Stand: Min assist;Min guard Stand pivot transfers: Min guard;Min assist       General transfer comment: completed transfer with stand pivot use armrest of chair, demonstrates good return for completing sit to stand using RW  Ambulation/Gait                 Stairs             Wheelchair Mobility    Modified Rankin (Stroke Patients Only)       Balance Overall balance assessment: Needs assistance Sitting-balance support: Feet supported;No upper extremity supported Sitting balance-Leahy Scale: Good Sitting balance - Comments: seated at EOB   Standing balance support: During functional activity;Bilateral upper extremity supported Standing balance-Leahy Scale: Fair Standing balance comment: using RW, fair/poor leaning during stand pivot leaning on armrest of chair                            Cognition Arousal/Alertness: Awake/alert Behavior During Therapy: WFL for tasks assessed/performed Overall Cognitive Status: Within  Functional Limits for tasks assessed                                        Exercises General Exercises - Lower Extremity Long Arc Quad: Seated;AROM;Strengthening;Left;15 reps Hip Flexion/Marching: Seated;AROM;Strengthening;Both;15 reps Toe Raises: Seated;AROM;Strengthening;Both;15 reps Heel Raises:  Seated;AROM;Strengthening;Both;15 reps    General Comments        Pertinent Vitals/Pain Pain Assessment: No/denies pain    Home Living                      Prior Function            PT Goals (current goals can now be found in the care plan section) Acute Rehab PT Goals Patient Stated Goal: return home PT Goal Formulation: With patient/family Time For Goal Achievement: 08/28/20 Potential to Achieve Goals: Good Progress towards PT goals: Progressing toward goals    Frequency    Min 3X/week      PT Plan Current plan remains appropriate    Co-evaluation              AM-PAC PT "6 Clicks" Mobility   Outcome Measure  Help needed turning from your back to your side while in a flat bed without using bedrails?: A Little Help needed moving from lying on your back to sitting on the side of a flat bed without using bedrails?: A Little Help needed moving to and from a bed to a chair (including a wheelchair)?: A Little Help needed standing up from a chair using your arms (e.g., wheelchair or bedside chair)?: A Little Help needed to walk in hospital room?: A Lot Help needed climbing 3-5 steps with a railing? : Total 6 Click Score: 15    End of Session   Activity Tolerance: Patient tolerated treatment well;Patient limited by fatigue Patient left: in chair;with call bell/phone within reach;with family/visitor present;with chair alarm set Nurse Communication: Mobility status PT Visit Diagnosis: Unsteadiness on feet (R26.81);Other abnormalities of gait and mobility (R26.89);Muscle weakness (generalized) (M62.81)     Time: 4315-4008 PT Time Calculation (min) (ACUTE ONLY): 27 min  Charges:  $Therapeutic Exercise: 8-22 mins $Therapeutic Activity: 8-22 mins                     2:47 PM, 08/25/20 Ocie Bob, MPT Physical Therapist with [facility] [phone] office [phone] mobile phone

## 2020-08-25 NOTE — Consult Note (Signed)
Name: [name] Petersen MRN: [id number] DOB: [birth year]    ADMISSION DATE:  08/21/2020 CONSULTATION DATE:  08/25/2020   REFERRING MD :  Marland Mcalpine, [facility]  CHIEF COMPLAINT:  wheezing  BRIEF PATIENT DESCRIPTION: 87 year old never smoker, N[facility] with recent shingles admitted with febrile illness and persistent wheezing  SIGNIFICANT EVENTS    STUDIES:  CT angiogram chest 5/7 no pulmonary emboli , Round density in the right breast?  Cyst , mild emphysema   HISTORY OF PRESENT ILLNESS: 87 year old with left BKA, nursing home resident brought in by EMS on 5/5 for shortness of breath that started 5/1 with nonproductive cough, low-grade fever 100.6.  She received nebulizer by EMS and in the ED and admitted.  Influenza and COVID testing was negative, chest x-ray was clear.  She was treated with Solu-Medrol, azithromycin, Xopenex and Atrovent nebs but wheezing has been persistent D-dimer was noted to be elevated and CT angiogram chest was obtained which was negative for PE.  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  She reports an episode of shingles over her left face which is now resolved PAST MEDICAL HISTORY :   has a past medical history of Arthritis, Atherosclerotic PVD with ulceration (HCC) (07/15/2017), Complication of anesthesia, Constipation, Diabetes mellitus without complication (HCC), Hypertension, Macular degeneration, bilateral, PAD (peripheral artery disease) (HCC), Pneumonia, and Stroke (HCC).  has a past surgical history that includes ABDOMINAL AORTOGRAM W/LOWER EXTREMITY (N/A, 06/16/2017); Amputation (Left, 07/06/2017); Cataract extraction w/ intraocular lens  implant, bilateral; Abdominal hysterectomy; Dilation and curettage of uterus; and Lower Extremity Angiography (N/A, 07/14/2017). Prior to  Admission medications   Medication Sig Start Date End Date Taking? Authorizing Provider  acetaminophen (TYLENOL) 500 MG tablet Take 500-1,000 mg by mouth See admin instructions. 500mg  bidprn for pain and 1000mg  QHS   Yes [provider]  albuterol (PROVENTIL) (2.5 MG/3ML) 0.083% nebulizer solution Take 2.5 mg by nebulization every 4 (four) hours as needed for wheezing or shortness of breath (while awake).   Yes [provider]  amLODipine (NORVASC) 10 MG tablet Take 1 tablet (10 mg total) by mouth daily. Patient taking differently: Take 5 mg by mouth daily. 07/29/17  Yes Love, , PA-C  beta carotene w/minerals (OCUVITE) tablet Take 1 tablet by mouth daily.   Yes [provider]  bisacodyl (DULCOLAX) 5 MG EC tablet Take 5 mg by mouth daily as needed for moderate constipation.   Yes [provider]  Cholecalciferol (VITAMIN D3) 50 MCG (2000 UT) capsule Take 2,000 Units by mouth daily.   Yes [provider]  clopidogrel (PLAVIX) 75 MG tablet Take 75 mg by mouth every evening. 04/03/14  Yes [provider]  diclofenac Sodium (VOLTAREN) 1 % GEL Apply 2 g topically daily as needed (pain).   Yes [provider]  docusate sodium (COLACE) 100 MG capsule Take 100 mg by mouth daily.   Yes [provider]  gabapentin (NEURONTIN) 100 MG capsule Take 100 mg by mouth in the morning.   Yes [provider]  gabapentin (NEURONTIN) 300 MG capsule Take 1 capsule (300 mg total) by mouth 3 (three) times daily. Patient taking differently: Take 300 mg by mouth at bedtime. 08/12/17 09/11/17 Yes 08/14/17, MD  guaiFENesin (MUCINEX) 600 MG 12 hr tablet Take 600 mg by mouth 2 (two)  times daily. For 2 weeks Started 08/19/2020 ends 09/05/2020   Yes [provider]  guaiFENesin (ROBITUSSIN) 100 MG/5ML liquid Take 200 mg by mouth every 4 (four) hours as needed for cough.   Yes [provider]  losartan-hydrochlorothiazide  (HYZAAR) 50-12.5 MG tablet Take 1 tablet by mouth in the morning and at bedtime.   Yes [provider]  lovastatin (MEVACOR) 20 MG tablet Take 20 mg by mouth daily.   Yes [provider]  melatonin 3 MG TABS tablet Take 3 mg by mouth at bedtime.   Yes [provider]  predniSONE (DELTASONE) 20 MG tablet Take 40-60 mg by mouth See admin instructions. Starting 5/5 take 60mg  qd ends 5/5 evening then begin 5/6 40mg  until 5/8   Yes [provider]  senna (SENOKOT) 8.6 MG TABS tablet Take 1 tablet by mouth at bedtime.   Yes [provider]  acetaminophen (TYLENOL) 325 MG tablet Take 1-2 tablets (325-650 mg total) by mouth every 6 (six) hours as needed for mild pain. Patient not taking: Reported on 08/22/2020 07/29/17   Love, Evlyn KannerPamela S, PA-C  polyethylene glycol [facility](MIRALAX / GLYCOLAX) packet Take 17 g by mouth daily as needed. Patient not taking: Reported on 08/22/2020 07/29/17   Love, Evlyn KannerPamela S, PA-C  polyvinyl alcohol (LIQUIFILM TEARS) 1.4 % ophthalmic solution Place 1 drop into both eyes as needed for dry eyes. Patient not taking: Reported on 08/22/2020 07/29/17   Love, Evlyn KannerPamela S, PA-C  senna-docusate (SENOKOT-S) 8.6-50 MG tablet Take 2 tablets by mouth at bedtime. Patient not taking: Reported on 08/22/2020 07/29/17   Love, Evlyn KannerPamela S, PA-C  valACYclovir (VALTREX) 1000 MG tablet Take 1,000 mg by mouth in the morning, at noon, and at bedtime. x7DS started 08/14/2020 ends 08/21/2020    [provider]   Allergies  Allergen Reactions  . Scopace [Scopolamine] Other (See Comments)    Hallucinations    FAMILY HISTORY:  family history is not on file. She was adopted. SOCIAL HISTORY:  reports that she has never smoked. She has never used smokeless tobacco. She reports that she does not drink alcohol and does not use drugs.  REVIEW OF SYSTEMS:   Constitutional: Negative for fever, chills, weight loss, malaise/fatigue and diaphoresis.  HENT: Negative for hearing loss,  ear pain, nosebleeds, congestion, sore throat, neck pain, tinnitus and ear discharge.   Eyes: Negative for blurred vision, double vision, photophobia, pain, discharge and redness.  Respiratory: Negative for  hemoptysis Cardiovascular: Negative for chest pain, palpitations, orthopnea, claudication, leg swelling and PND.  Gastrointestinal: Negative for heartburn, nausea, vomiting, abdominal pain, diarrhea, constipation, blood in stool and melena.  Genitourinary: Negative for dysuria, urgency, frequency, hematuria and flank pain.  Musculoskeletal: Negative for myalgias, back pain, joint pain and falls.  Skin: Negative for itching and rash.  Neurological: Negative for dizziness, tingling, tremors, sensory change, speech change, focal weakness, seizures, loss of consciousness, weakness and headaches.  Endo/Heme/Allergies: Negative for environmental allergies and polydipsia. Does not bruise/bleed easily.  SUBJECTIVE:   VITAL SIGNS: Temp:  [98.3 F (36.8 C)] 98.3 F (36.8 C) (05/09 0444) Pulse Rate:  [79-84] 84 (05/09 0924) Resp:  [20] 20 (05/09 0444) BP: (132-142)/(76-79) 142/79 (05/09 0924) SpO2:  [90 %-95 %] 95 % (05/09 0924)  PHYSICAL EXAMINATION: General: Elderly woman, lying supine in bed, no distress Neuro: Awake, interactive, nonfocal HEENT: No pallor, icterus, no JVD Cardiovascular: S1-S2 regular Lungs: Audible pseudo wheeze coming from upper airways and transmitted throughout lung fields, no accessory muscle use Abdomen:  Soft, nontender Musculoskeletal: Left BKA, no other deformity Skin: No rash, petechiae  Recent Labs  Lab 08/23/20 0646 08/24/20 0343 08/25/20 0452  NA 136 138 136  K 3.9 4.4 3.9  CL 103 105 100  CO2 22 24 25   BUN 29* 31* 32*  CREATININE 1.27* 1.21* 1.24*  GLUCOSE 165* 141* 186*   Recent Labs  Lab 08/22/20 0556 08/23/20 0646 08/24/20 0343  HGB 14.8 14.0 14.0  HCT 45.2 42.3 43.5  WBC 5.0 10.0 9.5  PLT 213 223 231   CT ANGIO CHEST PE W OR WO  CONTRAST  Result Date: 08/23/2020 CLINICAL DATA:  Respiratory distress EXAM: CT ANGIOGRAPHY CHEST WITH CONTRAST TECHNIQUE: Multidetector CT imaging of the chest was performed using the standard protocol during bolus administration of intravenous contrast. Multiplanar CT image reconstructions and MIPs were obtained to evaluate the vascular anatomy. CONTRAST:  10/23/2020 OMNIPAQUE IOHEXOL 350 MG/ML SOLN COMPARISON:  Chest x-ray from earlier in the same day. FINDINGS: Cardiovascular: Atherosclerotic calcifications of the thoracic aorta and its branches are noted. No aneurysmal dilatation or dissection is noted. Heart is at the upper limits of normal in size. Coronary calcifications are noted. Soft noncalcified atherosclerotic plaque is noted in the descending thoracic aorta. The pulmonary artery shows a normal branching pattern bilaterally. No filling defect is noted to correspond with pulmonary emboli. Mediastinum/Nodes: Thoracic inlet is within normal limits. No sizable hilar or mediastinal adenopathy is noted. The esophagus visualized is within limits. Lungs/Pleura: Mild emphysematous changes are noted. No focal infiltrate or sizable effusion is seen. No pneumothorax is noted. Upper Abdomen: Visualized upper abdomen shows multiple cystic structures within the liver. Right renal cyst is noted as well. Musculoskeletal: Degenerative changes of the thoracic spine are noted. 15 mm rounded density is noted in the right breast best seen on image number 104 of series 5. This may simply represent a cyst but is incompletely evaluated on this exam. Review of the MIP images confirms the above findings. IMPRESSION: No evidence of pulmonary emboli. Scattered hepatic and renal cysts. Rounded density of the right breast which may represent a cyst. The need for further evaluation can be determined on a clinical basis given the patient's age. No prior exam is available for comparison. Aortic Atherosclerosis (ICD10-I70.0) and Emphysema  (ICD10-J43.9). Electronically Signed   By: M.D.   On: 08/23/2020 16:43   DG CHEST PORT 1 VIEW  Result Date: 08/25/2020 CLINICAL DATA:  Shortness of breath, fever, history hypertension, diabetes mellitus, pneumonia EXAM: PORTABLE CHEST 1 VIEW COMPARISON:  Portable exam 0457 hours compared to 08/24/2020 FINDINGS: Normal heart size, mediastinal contours, and pulmonary vascularity. Atherosclerotic calcification aorta. Minimal bibasilar atelectasis. Lungs otherwise clear. No acute infiltrate, pleural effusion, or pneumothorax. Bones demineralized. IMPRESSION: Minimal bibasilar atelectasis. Aortic Atherosclerosis (ICD10-I70.0). Electronically Signed   By: 10/24/2020 M.D.   On: 08/25/2020 08:09   DG CHEST PORT 1 VIEW  Result Date: 08/24/2020 CLINICAL DATA:  87 year old female with shortness of breath and fever. Negative for COVID-19 three days ago. EXAM: PORTABLE CHEST 1 VIEW COMPARISON:  CTA chest 08/23/2020 and earlier. FINDINGS: Portable AP semi upright view at 0440 hours. Stable lung volumes and mediastinal contours. Visualized tracheal air column is within normal limits. Allowing for portable technique the lungs are clear. No pneumothorax. No acute osseous abnormality identified. Paucity of bowel gas in the upper abdomen. IMPRESSION: No acute cardiopulmonary abnormality. Electronically Signed   By: 10/23/2020 M.D.   On: 08/24/2020 05:12    ASSESSMENT / PLAN:  Interestingly she is a never smoker but mild emphysema is noted on the CT scan. She seems to have significant upper airway pseudo wheeze that is being transmitted to the lower airways.  Does not appear to have any kind of respiratory distress .  My independent review of CT angiogram does not show any evidence of tracheal stenosis.  She does not have an enlarged thyroid.  She denies any obvious dysphagia   Recommend -Would continue treatment with IV Solu-Medrol 60 every 12 -Continue Pulmicort twice daily and Xopenex and Atrovent  nebs -We will evaluate upper airway with swallowing test -Consider ENT evaluation if bronchospasm persists  Cyril Mourning MD. FCCP. [facility] Pager : [phone]  If no response to pager , please call [phone] until 7 pm After 7:00 pm call Elink  [phone]     08/25/2020, 9:47 AM

## 2020-08-25 NOTE — Progress Notes (Signed)
Nutrition Brief Note  RD consulted for assessment of nutritional requirements/ status.   Wt Readings from Last 15 Encounters:  08/21/20 71.2 kg  08/30/17 71.2 kg  07/15/17 71.3 kg  07/15/17 71.3 kg  07/11/17 76 kg  07/06/17 76.2 kg  06/16/17 76.2 kg  06/14/17 73 kg  05/27/17 72.6 kg  12/26/15 73.5 kg  12/11/15 71.2 kg  04/27/14 74.8 kg   Patient is a 87 year old overweight Caucasian female with a past medical history significant for but not limited to hypertension, hyperlipidemia, history of CVA, history of atherosclerotic PVD with ulceration status post Left BKA, constipation, macular degeneration, as well as other comorbidities who presented with a chief complaint of shortness of breath  Pt admitted with dyspnea and shortness of breath in the setting of acute bronchitis due to metapneumonic and suspected COPD exacerbation.   Reviewed I/O's: -780 ml x 24 hours and +756 ml since admission  UOP: 1.5 L x 24 hours  Spoke with pt and daughters at bedside. They all confirm pt is feeling better today. PTA she resides at [facility]. She shares she has a good appetite and usually consumed 3 meals and 2 snacks per day (Breakfast: eggs, oatmeal and breakfast meat; Lunch: meat, starch, and vegetable, Dinner: soup). Per daughter, each resident at the facility also receives packaged snacks 2-3 times per day from staff (cookies, crackers, etc).   Pt reports she is consuming most of her food here. She consumed about 50% of her lunch (did not like the meatloaf or green beans).   Pt denies any weight loss; she reports UBW is around 170#. She is wheelchair bound s/p lt BKA.   Medications reviewed and include prednisone.  Current diet order is heart healthy, patient is consuming approximately 25-100% of meals at this time. Labs and medications reviewed.   No nutrition interventions warranted at this time. If nutrition issues arise, please consult RD.   Levada Schilling, RD, LDN, CDCES Registered  Dietitian II Certified Diabetes Care and Education Specialist Please refer to [facility] for RD and/or RD on-call/weekend/after hours pager

## 2020-08-26 ENCOUNTER — Inpatient Hospital Stay (HOSPITAL_COMMUNITY): Payer: Medicare Other

## 2020-08-26 DIAGNOSIS — J208 Acute bronchitis due to other specified organisms: Principal | ICD-10-CM

## 2020-08-26 DIAGNOSIS — B9781 Human metapneumovirus as the cause of diseases classified elsewhere: Secondary | ICD-10-CM

## 2020-08-26 LAB — COMPREHENSIVE METABOLIC PANEL
ALT: 25 U/L (ref 0–44)
AST: 20 U/L (ref 15–41)
Albumin: 3.5 g/dL (ref 3.5–5.0)
Alkaline Phosphatase: 49 U/L (ref 38–126)
Anion gap: 10 (ref 5–15)
BUN: 35 mg/dL — ABNORMAL HIGH (ref 8–23)
CO2: 26 mmol/L (ref 22–32)
Calcium: 9 mg/dL (ref 8.9–10.3)
Chloride: 101 mmol/L (ref 98–111)
Creatinine, Ser: 1.27 mg/dL — ABNORMAL HIGH (ref 0.44–1.00)
GFR, Estimated: 40 mL/min — ABNORMAL LOW (ref >60)
Glucose, Bld: 208 mg/dL — ABNORMAL HIGH (ref 70–99)
Potassium: 3.9 mmol/L (ref 3.5–5.1)
Sodium: 137 mmol/L (ref 135–145)
Total Bilirubin: 0.8 mg/dL (ref 0.3–1.2)
Total Protein: 6.3 g/dL — ABNORMAL LOW (ref 6.5–8.1)

## 2020-08-26 LAB — CBC WITH DIFFERENTIAL/PLATELET
Abs Immature Granulocytes: 0.04 10*3/uL (ref 0.00–0.07)
Basophils Absolute: 0 10*3/uL (ref 0.0–0.1)
Basophils Relative: 0 %
Eosinophils Absolute: 0 10*3/uL (ref 0.0–0.5)
Eosinophils Relative: 0 %
HCT: 45.3 % (ref 36.0–46.0)
Hemoglobin: 15.2 g/dL — ABNORMAL HIGH (ref 12.0–15.0)
Immature Granulocytes: 1 %
Lymphocytes Relative: 15 %
Lymphs Abs: 1.1 10*3/uL (ref 0.7–4.0)
MCH: 29.8 pg (ref 26.0–34.0)
MCHC: 33.6 g/dL (ref 30.0–36.0)
MCV: 88.8 fL (ref 80.0–100.0)
Monocytes Absolute: 0.3 10*3/uL (ref 0.1–1.0)
Monocytes Relative: 4 %
Neutro Abs: 6.1 10*3/uL (ref 1.7–7.7)
Neutrophils Relative %: 80 %
Platelets: 234 10*3/uL (ref 150–400)
RBC: 5.1 MIL/uL (ref 3.87–5.11)
RDW: 13.3 % (ref 11.5–15.5)
WBC: 7.5 10*3/uL (ref 4.0–10.5)
nRBC: 0 % (ref 0.0–0.2)

## 2020-08-26 LAB — CULTURE, BLOOD (ROUTINE X 2)
Culture: NO GROWTH
Special Requests: ADEQUATE

## 2020-08-26 LAB — MAGNESIUM: Magnesium: 2.1 mg/dL (ref 1.7–2.4)

## 2020-08-26 LAB — PHOSPHORUS: Phosphorus: 3.3 mg/dL (ref 2.5–4.6)

## 2020-08-26 MED ORDER — METHYLPREDNISOLONE SODIUM SUCC 40 MG IJ SOLR
40.0000 mg | Freq: Two times a day (BID) | INTRAMUSCULAR | Status: DC
  Administered 2020-08-26 – 2020-08-28 (×4): 40 mg via INTRAVENOUS
  Filled 2020-08-26 (×4): qty 1

## 2020-08-26 NOTE — Progress Notes (Signed)
PROGRESS NOTE    [name] VETRANO  WUJ:[id number] DOB: [birth year] DOA: 08/21/2020 PCP: Pcp, No   Brief Narrative:  Patient is a 87 year old overweight Caucasian female with a past medical history significant for but not limited to hypertension, hyperlipidemia, history of CVA, history of atherosclerotic PVD with ulceration status post Left BKA, constipation, macular degeneration, as well as other comorbidities who presented with a chief complaint of shortness of breath.  She complained of increasing shortness of breath which started on Sunday, 08/17/2020 and this was associated with nonproductive cough general body aches.  Some of the history was  obtained from her daughter who was at bedside and patient had a febrile 100.6 at her ALF.  At the facility she is given acetaminophen prior to arrival and had reportedly a sick contact in the facility.  Patient was noted to have shortness of breath with audible wheezing and given breathing treatments without improvement so EMS was called and she was brought to the ED for further evaluation.  She was given a nebulized breathing treatment in the ED and evaluated and was found to be tachypneic and tachycardic.  Chest x-ray showed no acute pulmonary disease but she is given a dose of Solu-Medrol, duo nebs and IV ceftriaxone.  She is also given a liter normal saline and Hospitalist was consulted for further admission. Of Note her influenza a and B and SARS-CoV-2 testing was negative.  Respiratory status is improving but still not back to baseline.  Patient continues to significantly wheeze.  She is now having some delirium so she will be placed on delirium precautions.  Of note she was unable to sleep last night so she was given Ambien and a daughter states that it had an resting reaction so we will not give any more Ambien.  She continues to wheeze significantly but is improving minimally.  Refused her Solu-Medrol the night before last because of her intermittent delirium.   Because of lack of improvement will reach out to pulmonary and respiratory virus panel done and POSITIVE for Metapneumovirus.  Pulmonary evaluated and recommending continue treatment with IV Solu-Medrol 60 mg daily: Continue Pulmicort twice a day as well as Xopenex and Atrovent nebs. Pulmonary recommending evaluating upper airway with swallowing test and recommending considering an ENT evaluation if bronchospasm persists.   Patient seems to be improving and swallow evaluation which showed mild aspiration risk.  Pulmonary recommends decreasing Solu-Medrol 40 mg every 12 hours and switching to oral prednisone 40 mg tomorrow with a slow taper over 2 to 3 weeks.  They recommend continuing Pulmicort twice daily as well as Xopenex and Atrovent nebs.  Anticipating discharging home with home health next 24 to 48 hours  Assessment & Plan:   Principal Problem:   Acute bronchitis Active Problems:   PAD (peripheral artery disease) (HCC)   S/P below knee amputation, left (HCC)   Essential hypertension   Stage 3 chronic kidney disease (HCC)   History of stroke   Hyperlipidemia  Dyspnea and shortness of breath in the setting of likely acute bronchitis with bronchospasm due to metapneumovirus, persistent and mildly improving -She was given breathing treatments at the ALF and with EMS without relief -She was placed in observation on MedSurg and initiated on Xopenex every 6-every 4 as needed monitor and will add Atrovent every 6 scheduled; have now added budesonide 0.5 mg p.o. twice daily -Incentive spirometry and flutter valve -Continue with Solu-Medrol 60 mg every 12 scheduled and pulmonary agrees with this -We will continue azithromycin  but increase the dose from 250 mg po Daily to 500 mg 3 times daily.  She received a dose of IV ceftriaxone and azithromycin in the ED -Will add scheduled budesonide and Brovana I will continue -Changed to Guaifenesin 1200 mg po BID -Still has not received her flutter valve  or incentive spirometry -Continue to Monitor Respiratory Status Carefully -SpO2: 94 % O2 Flow Rate (L/min): 1.5 L/min; she was placed on oxygen for a O2 saturation 91%; WEAN O2 and not wearing it today  -Given her persistent wheezing we will check a respiratory virus panel and this was positive for metapneumovirus.  Initially placed on droplet precautions but can be discontinued; of note she is negative for COVID and influenza A and B -Continuous pulse oximetry maintain O2 saturations greater than 90% -Has now resumed her PPI but now made it twice daily -D-dimer is elevated to 1.15 so obtain a CTA PE protocol -CTA showed "No evidence of pulmonary emboli. Scattered hepatic and renal cysts. Rounded density of the right breast which may represent a cyst. The need for further evaluation can be determined on a clinical basis given the patient's age. No prior exam is available for comparison. Aortic Atherosclerosis and Emphysema." -Given a Dose of IV Lasix 40 mg x1 this hospitalization  -Repeat CXR in the AM; repeat chest x-ray this morning showed "Minimal bibasilar atelectasis.Aortic Atherosclerosis." -Pulmonary was consulted for further evaluation recommendations and appreciate Dr. Reginia Naas input.  Dr. Vassie Loll recommends continuing IV Solu-Medrol 60 mg every 12 scheduled but now reducing to IV 40 mg BID today and then changing to po Prednisone 08/27/20 with a  Slow taper, Xopenex and Atrovent as well as budesonide and recommends evaluating upper airway and recommending a swallow test which only mild aspiration risk; Dr. Vassie Loll recommends considering ENT evaluation if bronchospasm persists. -PT/OT recommending Home Health at D/C   CKD stage IIIb Metabolic Acidosis  -BUNs/creatinine 21/1.19 and repeat BUN/creatinine was 20/1.26 yesterday and today is 29/1.27 -> 31/1.21 -> 32/1.24 -> 35/1.27 with a  GFR of 40 -Given a Liter of normal saline bolus in the ED and was placed on IV fluid maintenance but this was  stopped and she was given a dose of IV Lasix yesterday -Patient had a Metabolic Acidosis with a CO2 of 18, anion gap of 13, chloride level 105; now her CO2 is 26, anion gap of 10, chloride level is 137 -Avoid nephrotoxic medications, contrast dyes, hypotension and renally adjust medications -Continue to monitor and trend renal function and repeat CMP in a.m.  Hyperlipidemia -Continue with Pravastatin 10 mg po Daily  History of CVA PAD -Continue with Clopidogrel 75 mg p.o. daily as well as Pravastatin 10 mg po Daily  HTN -C/w Amlodipine 10 mg po Daily  -Continue to Monitor BP per Protocol -Last BP Was 144/93  Insomnia -In the setting of steroids -Per Nursing she did not sleep on Night shift at all and was restless, tossing, and turning,  -We will not give any more zolpidem and discontinue; may need some Benadryl low-dose 25 mg nightly  Delirium ? Dementia with Sundowning, improving  -Easily reoriented but has had multiple changes the day before yesterday at ongoing to 3 different floors. Became agitated overnight. -Placed on delirium precautions -Daughter states that she was perseverating on things that she should not be but this is improved -Continue to Monitor closely   Hypophosphatemia -Patients Phos Level was 3.3 now -Continue to Monitor and Trend -Repeat Phos Level in the AM    DVT  prophylaxis: Enoxaparin 40 mg sq q24h Code Status: FULL CODE Family Communication: Discussed with Daughter at bedside  Disposition Plan: Pending further clinical Improvement in Respiratory Status and clearance by Pulmonary; Anticipating D/C Home in the Next 24-48 hours  Status is: Inpatient  Remains inpatient appropriate because:Unsafe d/c plan, IV treatments appropriate due to intensity of illness or inability to take PO and Inpatient level of care appropriate due to severity of illness   Dispo: The patient is from: Home              Anticipated d/c is to: Home              Patient  currently is not medically stable to d/c.   Difficult to place patient No  Consultants:   Pulmonary Dr. Vassie Loll    Procedures: None  Antimicrobials:  Anti-infectives (From admission, onward)   Start     Dose/Rate Route Frequency Ordered Stop   08/23/20 1000  azithromycin (ZITHROMAX) tablet 250 mg  Status:  Discontinued       "Followed by" Linked Group Details   250 mg Oral Daily 08/21/20 2348 08/23/20 0930   08/23/20 1000  azithromycin (ZITHROMAX) tablet 500 mg       "Followed by" Linked Group Details   500 mg Oral Daily 08/23/20 0930 08/26/20 0934   08/22/20 1000  azithromycin (ZITHROMAX) tablet 500 mg       "Followed by" Linked Group Details   500 mg Oral Daily 08/21/20 2348 08/22/20 0840   08/21/20 2045  cefTRIAXone (ROCEPHIN) 1 g in sodium chloride 0.9 % 100 mL IVPB        1 g 200 mL/hr over 30 Minutes Intravenous  Once 08/21/20 2040 08/21/20 2219        Subjective: Seen and examined at bedside and thinks she is feeling better and slept very well last night.  Coughing but not marked.  Wheezing is improving.  Denies any chest pain, lightheadedness or dizziness.  No other concerns or plans at this time.  Objective: Vitals:   08/25/20 1436 08/25/20 2016 08/25/20 2026 08/26/20 0723  BP: 140/72 (!) 144/93    Pulse: 86 82    Resp: 20 19    Temp: 98.2 F (36.8 C) 98.3 F (36.8 C)    TempSrc: Oral     SpO2: 95% 91% 93% 94%  Weight:      Height:        Intake/Output Summary (Last 24 hours) at 08/26/2020 1113 Last data filed at 08/26/2020 0900 Gross per 24 hour  Intake 720 ml  Output 1625 ml  Net -905 ml   Filed Weights   08/21/20 1957  Weight: 71.2 kg   Examination: Physical Exam:  Constitutional: WN/WD overweight elderly Caucasian female who is improved and is not in any acute distress.  She continues to wheeze but it is improving.  She appears calm Eyes: Lids and conjunctivae normal, sclerae anicteric  ENMT: External Ears, Nose appear normal. Grossly normal  hearing.  Neck: Appears normal, supple, no cervical masses, normal ROM, no appreciable thyromegaly; no JVD Respiratory: Diminished to auscultation bilaterally with coarse breath sounds and some expiratory wheezing.  No appreciable rhonchi or crackles.  Has a slightly increased respiratory effort but she is not wearing any supplemental oxygen via nasal cannula now. Cardiovascular: RRR, no murmurs / rubs / gallops. S1 and S2 auscultated.  No appreciable lower extremity edema on the right leg Abdomen: Soft, non-tender, distended secondary body habitus. Bowel sounds positive.  GU: Deferred.  Musculoskeletal: No clubbing / cyanosis of digits/nails. No joint deformity upper and lower extremities.  Skin: No rashes, lesions, ulcers on limited skin evaluation. No induration; Warm and dry.  Neurologic: CN 2-12 grossly intact with no focal deficits. Romberg sign and cerebellar reflexes not assessed.  Psychiatric: Normal judgment and insight. Alert and oriented x 3. Normal mood and appropriate affect.   Data Reviewed: I have personally reviewed following labs and imaging studies  CBC: Recent Labs  Lab 08/21/20 2059 08/22/20 0556 08/23/20 0646 08/24/20 0343 08/25/20 0452 08/26/20 0529  WBC 6.3 5.0 10.0 9.5 7.0 7.5  NEUTROABS 3.9  --  7.9* 7.3 5.5 6.1  HGB 14.9 14.8 14.0 14.0 15.0 15.2*  HCT 46.2* 45.2 42.3 43.5 45.6 45.3  MCV 91.7 90.9 91.0 91.8 90.1 88.8  PLT 229 213 223 231 235 234   Basic Metabolic Panel: Recent Labs  Lab 08/22/20 0433 08/23/20 0646 08/24/20 0343 08/25/20 0452 08/26/20 0529  NA 136 136 138 136 137  K 4.3 3.9 4.4 3.9 3.9  CL 105 103 105 100 101  CO2 18* 22 24 25 26   GLUCOSE 210* 165* 141* 186* 208*  BUN 20 29* 31* 32* 35*  CREATININE 1.26* 1.27* 1.21* 1.24* 1.27*  CALCIUM 8.9 8.7* 8.8* 9.0 9.0  MG 1.9 1.9 2.2 2.1 2.1  PHOS 2.3* 3.3 3.3 3.4 3.3   GFR: Estimated Creatinine Clearance: 26.6 mL/min (A) (by C-G formula based on SCr of 1.27 mg/dL (H)). Liver Function  Tests: Recent Labs  Lab 08/22/20 0433 08/23/20 0646 08/24/20 0343 08/25/20 0452 08/26/20 0529  AST 29 40 40 28 20  ALT 17 21 25 25 25   ALKPHOS 58 52 49 51 49  BILITOT 1.2 0.6 0.6 0.8 0.8  PROT 6.5 6.1* 6.2* 6.7 6.3*  ALBUMIN 3.5 3.4* 3.4* 3.6 3.5   No results for input(s): LIPASE, AMYLASE in the last 168 hours. No results for input(s): AMMONIA in the last 168 hours. Coagulation Profile: Recent Labs  Lab 08/22/20 0433  INR 1.0   Cardiac Enzymes: No results for input(s): CKTOTAL, CKMB, CKMBINDEX, TROPONINI in the last 168 hours. BNP (last 3 results) No results for input(s): PROBNP in the last 8760 hours. HbA1C: No results for input(s): HGBA1C in the last 72 hours. CBG: No results for input(s): GLUCAP in the last 168 hours. Lipid Profile: No results for input(s): CHOL, HDL, LDLCALC, TRIG, CHOLHDL, LDLDIRECT in the last 72 hours. Thyroid Function Tests: No results for input(s): TSH, T4TOTAL, FREET4, T3FREE, THYROIDAB in the last 72 hours. Anemia Panel: No results for input(s): VITAMINB12, FOLATE, FERRITIN, TIBC, IRON, RETICCTPCT in the last 72 hours. Sepsis Labs: Recent Labs  Lab 08/21/20 2059 08/21/20 2224  LATICACIDVEN 1.1 1.3    Recent Results (from the past 240 hour(s))  Resp Panel by RT-PCR (Flu A&B, Covid) Nasopharyngeal Swab     Status: None   Collection Time: 08/21/20  8:03 PM   Specimen: Nasopharyngeal Swab; Nasopharyngeal(NP) swabs in vial transport medium  Result Value Ref Range Status   SARS Coronavirus 2 by RT PCR NEGATIVE NEGATIVE Final    Comment: (NOTE) SARS-CoV-2 target nucleic acids are NOT DETECTED.  The SARS-CoV-2 RNA is generally detectable in upper respiratory specimens during the acute phase of infection. The lowest concentration of SARS-CoV-2 viral copies this assay can detect is 138 copies/mL. A negative result does not preclude SARS-Cov-2 infection and should not be used as the sole basis for treatment or other patient management  decisions. A negative result may occur with  improper specimen collection/handling, submission of specimen other than nasopharyngeal swab, presence of viral mutation(s) within the areas targeted by this assay, and inadequate number of viral copies(<138 copies/mL). A negative result must be combined with clinical observations, patient history, and epidemiological information. The expected result is Negative.  Fact Sheet for Patients:  BloggerCourse.comhttps://www.fda.gov/media/152166/download  Fact Sheet for Healthcare Providers:  SeriousBroker.ithttps://www.fda.gov/media/152162/download  This test is no t yet approved or cleared by the [place] FDA and  has been authorized for detection and/or diagnosis of SARS-CoV-2 by FDA under an Emergency Use Authorization (EUA). This EUA will remain  in effect (meaning this test can be used) for the duration of the COVID-19 declaration under Section 564(b)(1) of the Act, 21 U.S.C.section 360bbb-3(b)(1), unless the authorization is terminated  or revoked sooner.       Influenza A by PCR NEGATIVE NEGATIVE Final   Influenza B by PCR NEGATIVE NEGATIVE Final    Comment: (NOTE) The Xpert Xpress SARS-CoV-2/FLU/RSV plus assay is intended as an aid in the diagnosis of influenza from Nasopharyngeal swab specimens and should not be used as a sole basis for treatment. Nasal washings and aspirates are unacceptable for Xpert Xpress SARS-CoV-2/FLU/RSV testing.  Fact Sheet for Patients: BloggerCourse.comhttps://www.fda.gov/media/152166/download  Fact Sheet for Healthcare Providers: SeriousBroker.ithttps://www.fda.gov/media/152162/download  This test is not yet approved or cleared by the [place] FDA and has been authorized for detection and/or diagnosis of SARS-CoV-2 by FDA under an Emergency Use Authorization (EUA). This EUA will remain in effect (meaning this test can be used) for the duration of the COVID-19 declaration under Section 564(b)(1) of the Act, 21 U.S.C. section 360bbb-3(b)(1), unless the  authorization is terminated or revoked.  Performed at [facility], [address] [place], [place] [address]   Blood culture (routine x 2)     Status: None   Collection Time: 08/21/20  8:59 PM   Specimen: BLOOD  Result Value Ref Range Status   Specimen Description BLOOD LEFT ANTECUBITAL  Final   Special Requests   Final    BOTTLES DRAWN AEROBIC AND ANAEROBIC Blood Culture adequate volume   Culture   Final    NO GROWTH 5 DAYS Performed at [facility], [address] [place], [place] [address]    Report Status 08/26/2020 FINAL  Final  Blood culture (routine x 2)     Status: None   Collection Time: 08/21/20  8:59 PM   Specimen: BLOOD RIGHT HAND  Result Value Ref Range Status   Specimen Description BLOOD RIGHT HAND  Final   Special Requests   Final    BOTTLES DRAWN AEROBIC AND ANAEROBIC Blood Culture adequate volume   Culture   Final    NO GROWTH 5 DAYS Performed at [facility], [address] [place], [place] [address]    Report Status 08/26/2020 FINAL  Final  Respiratory (~20 pathogens) panel by PCR     Status: Abnormal   Collection Time: 08/24/20 11:07 AM   Specimen: Nasopharyngeal Swab; Respiratory  Result Value Ref Range Status   Adenovirus NOT DETECTED NOT DETECTED Final   Coronavirus 229E NOT DETECTED NOT DETECTED Final    Comment: (NOTE) The Coronavirus on the Respiratory Panel, DOES NOT test for the novel  Coronavirus (2019 nCoV)    Coronavirus HKU1 NOT DETECTED NOT DETECTED Final   Coronavirus NL63 NOT DETECTED NOT DETECTED Final   Coronavirus OC43 NOT DETECTED NOT DETECTED Final   Metapneumovirus DETECTED (A) NOT DETECTED Final   Rhinovirus / Enterovirus NOT DETECTED NOT DETECTED Final  Influenza A NOT DETECTED NOT DETECTED Final   Influenza B NOT DETECTED NOT DETECTED Final   Parainfluenza Virus 1 NOT DETECTED NOT DETECTED Final   Parainfluenza Virus 2 NOT DETECTED NOT DETECTED Final   Parainfluenza Virus 3 NOT DETECTED NOT DETECTED Final   Parainfluenza  Virus 4 NOT DETECTED NOT DETECTED Final   Respiratory Syncytial Virus NOT DETECTED NOT DETECTED Final   Bordetella pertussis NOT DETECTED NOT DETECTED Final   Bordetella Parapertussis NOT DETECTED NOT DETECTED Final   Chlamydophila pneumoniae NOT DETECTED NOT DETECTED Final   Mycoplasma pneumoniae NOT DETECTED NOT DETECTED Final    Comment: Performed at [facility], [address] [place], [place] [address]    RN Pressure Injury Documentation: Pressure Injury 07/10/17 serous filled blister (Active)  07/10/17 1845  Location: Heel  Location Orientation: Right;Distal  Staging:   Wound Description (Comments): serous filled blister  Present on Admission: Yes    Estimated body mass index is 28.71 kg/m as calculated from the following:   Height as of this encounter: 5\' 2"  (1.575 m).   Weight as of this encounter: 71.2 kg.  Malnutrition Type:   Malnutrition Characteristics:   Nutrition Interventions:    Radiology Studies: DG CHEST PORT 1 VIEW  Result Date: 08/26/2020 CLINICAL DATA:  Shortness of breath EXAM: PORTABLE CHEST 1 VIEW COMPARISON:  08/25/2020 FINDINGS: Heart is normal size. Aortic atherosclerosis. No confluent opacities or effusions. No acute bony abnormality. IMPRESSION: No active disease. Electronically Signed   By: 10/25/2020 M.D.   On: 08/26/2020 08:14   DG CHEST PORT 1 VIEW  Result Date: 08/25/2020 CLINICAL DATA:  Shortness of breath, fever, history hypertension, diabetes mellitus, pneumonia EXAM: PORTABLE CHEST 1 VIEW COMPARISON:  Portable exam 0457 hours compared to 08/24/2020 FINDINGS: Normal heart size, mediastinal contours, and pulmonary vascularity. Atherosclerotic calcification aorta. Minimal bibasilar atelectasis. Lungs otherwise clear. No acute infiltrate, pleural effusion, or pneumothorax. Bones demineralized. IMPRESSION: Minimal bibasilar atelectasis. Aortic Atherosclerosis (ICD10-I70.0). Electronically Signed   By: 10/24/2020 M.D.   On: 08/25/2020  08:09   Scheduled Meds: . amLODipine  10 mg Oral Daily  . budesonide (PULMICORT) nebulizer solution  0.5 mg Nebulization BID  . Chlorhexidine Gluconate Cloth  6 each Topical Q0600  . clopidogrel  75 mg Oral Daily  . enoxaparin (LOVENOX) injection  30 mg Subcutaneous Q24H  . guaiFENesin  1,200 mg Oral BID  . ipratropium  0.5 mg Nebulization TID  . levalbuterol  0.63 mg Nebulization TID  . methylPREDNISolone (SOLU-MEDROL) injection  40 mg Intravenous Q12H  . mupirocin ointment  1 application Nasal BID  . pantoprazole  40 mg Oral BID  . pravastatin  10 mg Oral q1800   Continuous Infusions:   LOS: 3 days   10/25/2020, DO [facility] PAGER is on AMION  If 7PM-7AM, please contact night-coverage [email or web address]

## 2020-08-26 NOTE — Plan of Care (Signed)
  Problem: Acute Rehab OT Goals (only OT should resolve) Goal: Pt. Will Perform Lower Body Dressing Flowsheets (Taken 08/26/2020 1308) Pt Will Perform Lower Body Dressing:  with min guard assist  sitting/lateral leans  with adaptive equipment Goal: Pt. Will Transfer To Toilet Flowsheets (Taken 08/26/2020 1308) Pt Will Transfer to Toilet:  stand pivot transfer  with supervision  with min guard assist  grab bars Goal: Pt/Caregiver Will Perform Home Exercise Program Flowsheets (Taken 08/26/2020 1308) Pt/caregiver will Perform Home Exercise Program:  Increased strength  Both right and left upper extremity  Independently  Lamya Lausch OT, MOT

## 2020-08-26 NOTE — Progress Notes (Signed)
Name: [name] Petersen MRN: [id number] DOB: 05-19-1926    ADMISSION DATE:  08/21/2020 CONSULTATION DATE:  08/26/2020   REFERRING MD :  Marland Mcalpine, [facility]  CHIEF COMPLAINT:  wheezing  BRIEF PATIENT DESCRIPTION: 87 year old never smoker, NHR with recent shingles admitted with febrile illness and persistent wheezing  SIGNIFICANT EVENTS    STUDIES:  CT angiogram chest 5/7 no pulmonary emboli , Round density in the right breast?  Cyst , mild emphysema   HISTORY OF PRESENT ILLNESS: 87 year old with left BKA, nursing home resident brought in by EMS on 5/5 for shortness of breath that started 5/1 with nonproductive cough, low-grade fever 100.6.  She received nebulizer by EMS and in the ED and admitted.  Influenza and COVID testing was negative, chest x-ray was clear.  She was treated with Solu-Medrol, azithromycin, Xopenex and Atrovent nebs but wheezing has been persistent D-dimer was noted to be elevated and CT angiogram chest was obtained which was negative for PE.  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  She reports an episode of shingles over her left face which is now resolved PAST MEDICAL HISTORY :   has a past medical history of Arthritis, Atherosclerotic PVD with ulceration (HCC) (07/15/2017), Complication of anesthesia, Constipation, Diabetes mellitus without complication (HCC), Hypertension, Macular degeneration, bilateral, PAD (peripheral artery disease) (HCC), Pneumonia, and Stroke (HCC).  has a past surgical history that includes ABDOMINAL AORTOGRAM W/LOWER EXTREMITY (N/A, 06/16/2017); Amputation (Left, 07/06/2017); Cataract extraction w/ intraocular lens  implant, bilateral; Abdominal hysterectomy; Dilation and curettage of uterus; and Lower Extremity Angiography (N/A, 07/14/2017).      SUBJECTIVE: Breathing better. Afebrile Daughter at bedside  VITAL SIGNS: Temp:  [98.2 F (36.8 C)-98.3 F (36.8 C)] 98.3 F (36.8 C) (05/09 2016) Pulse Rate:  [82-86] 82 (05/09 2016) Resp:  [19-20] 19 (05/09 2016) BP: (140-144)/(72-93) 144/93 (05/09 2016) SpO2:  [91 %-95 %] 94 % (05/10 0723)  PHYSICAL EXAMINATION: General: Elderly woman, sitting up in bed for breakfast, no distress Neuro: Awake, interactive, nonfocal HEENT: No pallor, icterus, no JVD Cardiovascular: S1-S2 regular, no murmur Lungs: Bilateral diffuse rhonchi, no accessory muscle use Abdomen: Soft, nontender Musculoskeletal: Left BKA, no other deformity Skin: No rash, petechiae  Recent Labs  Lab 08/24/20 0343 08/25/20 0452 08/26/20 0529  NA 138 136 137  K 4.4 3.9 3.9  CL 105 100 101  CO2 24 25 26   BUN 31* 32* 35*  CREATININE 1.21* 1.24* 1.27*  GLUCOSE 141* 186* 208*   Recent Labs  Lab 08/24/20 0343 08/25/20 0452 08/26/20 0529  HGB 14.0 15.0 15.2*  HCT 43.5 45.6 45.3  WBC 9.5 7.0 7.5  PLT 231 235 234   DG CHEST PORT 1 VIEW  Result Date: 08/26/2020 CLINICAL DATA:  Shortness of breath EXAM: PORTABLE CHEST 1 VIEW COMPARISON:  08/25/2020 FINDINGS: Heart is normal size. Aortic atherosclerosis. No confluent opacities or effusions. No acute bony abnormality. IMPRESSION: No active disease. Electronically Signed   By: 10/25/2020 M.D.   On: 08/26/2020 08:14   DG CHEST PORT 1 VIEW  Result Date: 08/25/2020 CLINICAL DATA:  Shortness of breath, fever, history hypertension, diabetes mellitus, pneumonia EXAM: PORTABLE CHEST 1 VIEW COMPARISON:  Portable exam 0457 hours compared to 08/24/2020 FINDINGS: Normal heart size, mediastinal contours, and pulmonary vascularity. Atherosclerotic calcification aorta. Minimal bibasilar atelectasis. Lungs otherwise clear.  No acute infiltrate, pleural effusion, or pneumothorax. Bones demineralized. IMPRESSION: Minimal bibasilar atelectasis. Aortic Atherosclerosis  (ICD10-I70.0). Electronically Signed   By: Ulyses Southward M.D.   On: 08/25/2020 08:09    ASSESSMENT / PLAN:  Acute bronchitis with bronchospasm -respiratory viral panel positive for metapneumovirus Interestingly she is a never smoker but mild emphysema is noted on the CT scan. Swallow evaluation showed mild aspiration risk, appreciate recommendations    Recommend -Decrease Solu-Medrol 40 every 12, can switch to oral prednisone 40 mg tomorrow with a slow taper over 2 to 3 weeks -Continue Pulmicort twice daily and Xopenex and Atrovent nebs -Not uncommon for metapneumovirus bronchitis to take a few weeks to resolve   Updated patient and daughter at bedside PCCM will be available as needed  Cyril Mourning MD. FCCP. [facility] Pager : [phone]  If no response to pager , please call [phone] until 7 pm After 7:00 pm call Elink  [phone]     08/26/2020, 8:32 AM

## 2020-08-26 NOTE — Evaluation (Signed)
Occupational Therapy Evaluation Patient Details Name: [name] Petersen MRN: [id number] DOB: [birth year] Today's Date: 08/26/2020    History of Present Illness [name] Petersen is a 87 y.o. female with medical history significant for hypertension, hyperlipidemia, history of stroke who presents to the emergency department via EMS from [facility] due to shortness of breath.  Patient complained of increasing shortness of breath which started on Sunday (5/1), this was associated with a nonproductive cough and generalized body aches.  Some of the history was obtained from daughter at bedside, per daughter, patient had a fever of 100.38F at the living facility and Tylenol was given prior to arrival, patient was also reported to have had sick contact at the living facility.  Shortness of breath was seen with audible wheezing sound, breathing treatment was given at the living facility without improvement, so EMS was activated, on arrival of EMS team, 1 nebulizer treatment was also provided and patient was taken to the ED for further evaluation and management.  Patient states that she already had 2 COVID-19 vaccines and a booster.   Clinical Impression   Pt agreeable to OT evaluation. Pt presents with B UE shoulder weakness and requires moderate assist this date for stand pivot transfer from EOB to chair using RW. Pt unsteady and reported "not trusting" the RW. Pt required Min A for supine to sit bed mobility. Pt complained of 4/10 L leg pain but did not seem impacted by it. Pt will benefit from continued Ot in the hospital and recommended venue below to increase strength, balance, and endurance for safe ADL's.     Follow Up Recommendations  Home health OT;Other (comment) (SPV/assist for transfers)    Equipment Recommendations  None recommended by OT           Precautions / Restrictions Precautions Precautions: Fall Precaution Comments: Left BKA Restrictions Weight Bearing Restrictions: No       Mobility Bed Mobility Overal bed mobility: Needs Assistance Bed Mobility: Supine to Sit     Supine to sit: Min assist     General bed mobility comments: required use of bed rail and min assist to complete supine to sitting    Transfers Overall transfer level: Needs assistance Equipment used: Rolling walker (2 wheeled);1 person hand held assist;None Transfers: Sit to/from [employer] Sit to Stand: Mod assist Stand pivot transfers: Mod assist       General transfer comment: using RW for stand pivot to chair    Balance Overall balance assessment: Needs assistance Sitting-balance support: Feet supported;No upper extremity supported Sitting balance-Leahy Scale: Good Sitting balance - Comments: seated at EOB   Standing balance support: During functional activity;Bilateral upper extremity supported Standing balance-Leahy Scale: Fair Standing balance comment: using RW, fair/poor leaning during stand pivot leaning on armrest of chair                           ADL either performed or assessed with clinical judgement   ADL Overall ADL's : Needs assistance/impaired                         Toilet Transfer: Moderate assistance;RW Toilet Transfer Details (indicate cue type and reason): simulated via stand pivot EOB to chair.                 Vision Baseline Vision/History: Macular Degeneration  Pertinent Vitals/Pain Pain Assessment: 0-10 Pain Score: 4  Pain Location: L upper leg Pain Descriptors / Indicators: Other (Comment) (steady) Pain Intervention(s): Limited activity within patient's tolerance;Monitored during session     Hand Dominance Right   Extremity/Trunk Assessment Upper Extremity Assessment Upper Extremity Assessment: Generalized weakness (3+/5 MMT shoulder)   Lower Extremity Assessment Lower Extremity Assessment: Defer to PT evaluation   Cervical / Trunk Assessment Cervical / Trunk  Assessment: Normal   Communication Communication Communication: No difficulties   Cognition Arousal/Alertness: Awake/alert Behavior During Therapy: WFL for tasks assessed/performed Overall Cognitive Status: Within Functional Limits for tasks assessed                                                      Home Living Family/patient expects to be discharged to:: Assisted living                             Home Equipment: Wheelchair - manual   Additional Comments: history taken from documentation review      Prior Functioning/Environment Level of Independence: Needs assistance  Gait / Transfers Assistance Needed: assisted stand pivot transfers, non-ambulatory ADL's / Homemaking Assistance Needed: assisted by ALF staff   Comments: history taken per documentation review.        OT Problem List: Decreased strength;Decreased activity tolerance;Impaired balance (sitting and/or standing)      OT Treatment/Interventions: Self-care/ADL training;Therapeutic exercise;DME and/or AE instruction;Therapeutic activities;Patient/family education;Balance training    OT Goals(Current goals can be found in the care plan section) Acute Rehab OT Goals Patient Stated Goal: return home OT Goal Formulation: With patient Time For Goal Achievement: 09/09/20 Potential to Achieve Goals: Good  OT Frequency: Min 2X/week                     End of Session Equipment Utilized During Treatment: Rolling walker  Activity Tolerance: Patient tolerated treatment well Patient left: in chair;with call bell/phone within reach;with family/visitor present  OT Visit Diagnosis: Unsteadiness on feet (R26.81);Other abnormalities of gait and mobility (R26.89);Muscle weakness (generalized) (M62.81);Dizziness and giddiness (R42)                Time: 2353-6144 OT Time Calculation (min): 15 min Charges:  OT General Charges $OT Visit: 1 Visit OT Evaluation $OT Eval Low  Complexity: 1 Low  Selinda Korzeniewski OT, MOT   Danie Chandler 08/26/2020, 1:05 PM

## 2020-08-26 NOTE — Progress Notes (Signed)
Physical Therapy Treatment Patient Details Name: [name] Petersen MRN: [id number] DOB: 11/18/1926 Today's Date: 08/26/2020    History of Present Illness [name] Petersen is a 87 y.o. female presents with SOB, nonproductive cough, generalized body aches. PMH:HTN, diabetes, PAD, PVD, macular degeneration, stroke, L BKA 06/2017    PT Comments    Pt tolerates seated BLE strengthening exercises, cued for motor control and increased ROM. Pt educated on hand placement with STS and stand pivot, fair carryover, still reports anxiousness with RW. Pt assisted back to supine, fatigued after being in chair all day. Offered additional supine exercises to perform later, but pt politely declines. Will continue to progress acute PT as able.   Follow Up Recommendations  Home health PT;Supervision for mobility/OOB;Supervision - Intermittent     Equipment Recommendations  None recommended by PT    Recommendations for Other Services       Precautions / Restrictions Precautions Precautions: Fall Precaution Comments: Left BKA Restrictions Weight Bearing Restrictions: No    Mobility  Bed Mobility Overal bed mobility: Needs Assistance Bed Mobility: Sit to Supine  Supine to sit: Min assist Sit to supine: Supervision   General bed mobility comments: both bedrails up using UEs to assist with repositioning into center of bed    Transfers Overall transfer level: Needs assistance Equipment used: Rolling walker (2 wheeled) Transfers: Sit to/from [employer] Sit to Stand: Min assist Stand pivot transfers: Min assist  General transfer comment: VCs for hand placement on seated surface to assist with powering up to stand, min A to power up and steady with pivot over to bed  Ambulation/Gait  General Gait Details: w/c at baseline   Stairs      Wheelchair Mobility    Modified Rankin (Stroke Patients Only)       Balance Overall balance assessment: Needs assistance Sitting-balance  support: Feet supported;No upper extremity supported Sitting balance-Leahy Scale: Good Sitting balance - Comments: seated at EOB   Standing balance support: During functional activity;Bilateral upper extremity supported Standing balance-Leahy Scale: Poor Standing balance comment: reliant on UE support       Cognition Arousal/Alertness: Awake/alert Behavior During Therapy: WFL for tasks assessed/performed Overall Cognitive Status: Within Functional Limits for tasks assessed         Exercises General Exercises - Lower Extremity Gluteal Sets: Seated;AROM;Strengthening;Both;15 reps (3 sec isometric hold, gait belt for resistance) Long Arc Quad: Seated;AROM;Strengthening;Right;15 reps Hip Flexion/Marching: Seated;AROM;Strengthening;Both;15 reps    General Comments        Pertinent Vitals/Pain Pain Assessment: No/denies pain Pain Score: 4  Pain Location: L upper leg Pain Descriptors / Indicators: Other (Comment) (steady) Pain Intervention(s): Limited activity within patient's tolerance;Monitored during session    Home Living Family/patient expects to be discharged to:: Assisted living             Home Equipment: Wheelchair - manual Additional Comments: history taken from documentation review    Prior Function Level of Independence: Needs assistance  Gait / Transfers Assistance Needed: assisted stand pivot transfers, non-ambulatory ADL's / Homemaking Assistance Needed: assisted by ALF staff Comments: history taken per documentation review.   PT Goals (current goals can now be found in the care plan section) Acute Rehab PT Goals Patient Stated Goal: return home PT Goal Formulation: With patient/family Time For Goal Achievement: 08/28/20 Potential to Achieve Goals: Good Progress towards PT goals: Progressing toward goals    Frequency    Min 3X/week      PT Plan Current plan remains appropriate  Co-evaluation              AM-PAC PT "6 Clicks"  Mobility   Outcome Measure  Help needed turning from your back to your side while in a flat bed without using bedrails?: A Little Help needed moving from lying on your back to sitting on the side of a flat bed without using bedrails?: A Little Help needed moving to and from a bed to a chair (including a wheelchair)?: A Little Help needed standing up from a chair using your arms (e.g., wheelchair or bedside chair)?: A Little Help needed to walk in hospital room?: Total Help needed climbing 3-5 steps with a railing? : Total 6 Click Score: 14    End of Session Equipment Utilized During Treatment: Gait belt Activity Tolerance: Patient tolerated treatment well;Patient limited by fatigue Patient left: in bed;with call bell/phone within reach;with family/visitor present Nurse Communication: Mobility status PT Visit Diagnosis: Unsteadiness on feet (R26.81);Other abnormalities of gait and mobility (R26.89);Muscle weakness (generalized) (M62.81)     Time: 7357-8978 PT Time Calculation (min) (ACUTE ONLY): 14 min  Charges:  $Therapeutic Exercise: 8-22 mins                      Tori Kamorie Aldous PT, DPT 08/26/20, 1:47 PM

## 2020-08-26 NOTE — TOC Initial Note (Signed)
Transition of Care [facility]) - Initial/Assessment Note    Patient Details  Name: [name] Petersen MRN: [id number] Date of Birth: November 09, 1926  [facility] of [facility] CM/SW Contact:    Villa Herb, LCSWA Phone Number: 08/26/2020, 11:11 AM  Clinical Narrative:                 CSW spoke with rep at [facility] who states they will need an Fl2 when pt is ready for discharge. Also, Chip Boer will set pt up with [facility] PT once she has returned as long as orders are placed. CSW to ensure needed documents are provided to facility. TOC to follow.    Expected Discharge Plan: Assisted Living Barriers to Discharge: Continued Medical Work up   Patient Goals and CMS Choice Patient states their goals for this hospitalization and ongoing recovery are:: Go back to [email or web address] Compare Post Acute Care list provided to:: Patient Choice offered to / list presented to : Patient  Expected Discharge Plan and Services Expected Discharge Plan: Assisted Living In-house Referral: Clinical Social Work Discharge Planning Services: CM Consult Post Acute Care Choice: Home Health Living arrangements for the past 2 months: Assisted Living Facility                 DME Arranged: N/A DME Agency: NA       HH Arranged: PT          Prior Living Arrangements/Services Living arrangements for the past 2 months: Assisted Living Facility Lives with:: Facility Resident Patient language and need for interpreter reviewed:: Yes Do you feel safe going back to the place where you live?: Yes      Need for Family Participation in Patient Care: Yes (Comment) Care giver support system in place?: Yes (comment) Current home services: Home PT Criminal Activity/Legal Involvement Pertinent to Current Situation/Hospitalization: No - Comment as needed  Activities of Daily Living Home Assistive Devices/Equipment: Wheelchair ADL Screening (condition at time of admission) Patient's cognitive ability adequate to safely  complete daily activities?: Yes Is the patient deaf or have difficulty hearing?: Yes Does the patient have difficulty seeing, even when wearing glasses/contacts?: Yes Does the patient have difficulty concentrating, remembering, or making decisions?: No Patient able to express need for assistance with ADLs?: Yes Does the patient have difficulty dressing or bathing?: No Independently performs ADLs?: Yes (appropriate for developmental age) Does the patient have difficulty walking or climbing stairs?: Yes Weakness of Legs: Right Weakness of Arms/Hands: None  Permission Sought/Granted                  Emotional Assessment Appearance:: Appears stated age       Alcohol / Substance Use: Not Applicable Psych Involvement: No (comment)  Admission diagnosis:  Acute bronchitis [J20.9] Acute bronchitis, unspecified organism [J20.9] Patient Active Problem List   Diagnosis Date Noted  . Acute bronchitis 08/21/2020  . History of stroke 08/21/2020  . Hyperlipidemia 08/21/2020  . Urinary retention   . Phantom limb pain (HCC)   . Unilateral complete BKA, left, sequela (HCC)   . Slow transit constipation   . Acute lower UTI   . Atherosclerotic PVD with ulceration (HCC) 07/14/2017  . Pressure injury of right heel, stage 3 (HCC)   . Sleep disturbance   . Poor nutrition   . Hypokalemia   . Drug-induced constipation   . Essential hypertension   . Stage 3 chronic kidney disease (HCC)   . Postoperative pain   . Diabetes mellitus type 2 in obese (  HCC)   . S/P below knee amputation, left (HCC) 07/10/2017  . PAD (peripheral artery disease) (HCC) 07/06/2017  . Atherosclerosis of native arteries of the extremities with ulceration (HCC) 06/16/2017   PCP:  Pcp, No Pharmacy:   [id number] - [place], [place] - [address] [place] [address] Phone: [phone] Fax: [phone]     Social Determinants of Health  (SDOH) Interventions    Readmission Risk Interventions Readmission Risk Prevention Plan 08/26/2020  Transportation Screening Complete  Home Care Screening Complete  Medication Review (RN CM) Complete  Some recent data might be hidden

## 2020-08-27 ENCOUNTER — Inpatient Hospital Stay (HOSPITAL_COMMUNITY): Payer: Medicare Other

## 2020-08-27 LAB — CBC WITH DIFFERENTIAL/PLATELET
Abs Immature Granulocytes: 0.08 10*3/uL — ABNORMAL HIGH (ref 0.00–0.07)
Basophils Absolute: 0 10*3/uL (ref 0.0–0.1)
Basophils Relative: 0 %
Eosinophils Absolute: 0 10*3/uL (ref 0.0–0.5)
Eosinophils Relative: 0 %
HCT: 45.8 % (ref 36.0–46.0)
Hemoglobin: 15.1 g/dL — ABNORMAL HIGH (ref 12.0–15.0)
Immature Granulocytes: 1 %
Lymphocytes Relative: 11 %
Lymphs Abs: 0.9 10*3/uL (ref 0.7–4.0)
MCH: 29.6 pg (ref 26.0–34.0)
MCHC: 33 g/dL (ref 30.0–36.0)
MCV: 89.8 fL (ref 80.0–100.0)
Monocytes Absolute: 0.4 10*3/uL (ref 0.1–1.0)
Monocytes Relative: 5 %
Neutro Abs: 6.8 10*3/uL (ref 1.7–7.7)
Neutrophils Relative %: 83 %
Platelets: 249 10*3/uL (ref 150–400)
RBC: 5.1 MIL/uL (ref 3.87–5.11)
RDW: 13.2 % (ref 11.5–15.5)
WBC: 8.2 10*3/uL (ref 4.0–10.5)
nRBC: 0 % (ref 0.0–0.2)

## 2020-08-27 LAB — COMPREHENSIVE METABOLIC PANEL
ALT: 23 U/L (ref 0–44)
AST: 17 U/L (ref 15–41)
Albumin: 3.3 g/dL — ABNORMAL LOW (ref 3.5–5.0)
Alkaline Phosphatase: 45 U/L (ref 38–126)
Anion gap: 10 (ref 5–15)
BUN: 40 mg/dL — ABNORMAL HIGH (ref 8–23)
CO2: 25 mmol/L (ref 22–32)
Calcium: 9 mg/dL (ref 8.9–10.3)
Chloride: 101 mmol/L (ref 98–111)
Creatinine, Ser: 1.31 mg/dL — ABNORMAL HIGH (ref 0.44–1.00)
GFR, Estimated: 38 mL/min — ABNORMAL LOW (ref >60)
Glucose, Bld: 222 mg/dL — ABNORMAL HIGH (ref 70–99)
Potassium: 4 mmol/L (ref 3.5–5.1)
Sodium: 136 mmol/L (ref 135–145)
Total Bilirubin: 0.7 mg/dL (ref 0.3–1.2)
Total Protein: 6 g/dL — ABNORMAL LOW (ref 6.5–8.1)

## 2020-08-27 LAB — PHOSPHORUS: Phosphorus: 3.3 mg/dL (ref 2.5–4.6)

## 2020-08-27 LAB — MAGNESIUM: Magnesium: 2.1 mg/dL (ref 1.7–2.4)

## 2020-08-27 NOTE — Progress Notes (Signed)
Physical Therapy Treatment Patient Details Name: [name] Petersen MRN: [id number] DOB: [birth year] Today's Date: 08/27/2020    History of Present Illness [name] Petersen is a 87 y.o. female presents with SOB, nonproductive cough, generalized body aches. PMH:HTN, diabetes, PAD, PVD, macular degeneration, stroke, L BKA 06/2017    PT Comments    Pt amenable to PT tx on this day and able to perform bed mobility with min A for steadying support in advancing to sitting EOB and once sitting demo Good static sitting balance and performance of LE there ex in upright sitting.  No undue fatigue or exertion appreciated during exercise.  Proceeded with sit to stand from elevated EOB and 3x sit to stand with min-mod A for steadying support due to L BKA.  Stand-pivot from EOB to recliner with min A for steadying support and assist with pivoting.  Pt reports feeling well at end of session and is content to remain in recliner.  Chair alarm set and call bell placed on person.     Follow Up Recommendations  Home health PT;Supervision for mobility/OOB;Supervision - Intermittent     Equipment Recommendations  None recommended by PT    Recommendations for Other Services       Precautions / Restrictions      Mobility  Bed Mobility Overal bed mobility: Needs Assistance Bed Mobility: Supine to Sit     Supine to sit: Min assist       Patient Response: Cooperative  Transfers Overall transfer level: Needs assistance Equipment used: 1 person hand held assist Transfers: Sit to/from [employer] Sit to Stand: Min assist Stand pivot transfers: Min assist       General transfer comment: VCs for hand placement on seated surface to assist with powering up to stand, min A to power up and steady with pivot over to bed  Ambulation/Gait             General Gait Details: w/c at baseline   Stairs             Wheelchair Mobility    Modified Rankin (Stroke Patients Only)        Balance                                            Cognition                                              Exercises General Exercises - Lower Extremity Ankle Circles/Pumps: AROM;Right;20 reps Quad Sets: Right;20 reps;Strengthening Gluteal Sets: Seated;AROM;Strengthening;Both;15 reps (3 sec isometric hold, gait belt for resistance) Long Arc Quad: Seated;AROM;Strengthening;Right;15 reps Hip Flexion/Marching: Seated;AROM;Strengthening;Both;15 reps    General Comments        Pertinent Vitals/Pain      Home Living                      Prior Function            PT Goals (current goals can now be found in the care plan section) Acute Rehab PT Goals Patient Stated Goal: return home PT Goal Formulation: With patient/family Time For Goal Achievement: 08/28/20 Potential to Achieve Goals: Good Progress towards PT goals: Progressing toward goals    Frequency  Min 3X/week      PT Plan Current plan remains appropriate    Co-evaluation              AM-PAC PT "6 Clicks" Mobility   Outcome Measure  Help needed turning from your back to your side while in a flat bed without using bedrails?: A Little Help needed moving from lying on your back to sitting on the side of a flat bed without using bedrails?: A Little Help needed moving to and from a bed to a chair (including a wheelchair)?: A Little Help needed standing up from a chair using your arms (e.g., wheelchair or bedside chair)?: A Little Help needed to walk in hospital room?: Total Help needed climbing 3-5 steps with a railing? : Total 6 Click Score: 14    End of Session Equipment Utilized During Treatment: Gait belt Activity Tolerance: Patient tolerated treatment well;Patient limited by fatigue Patient left: with call bell/phone within reach;in chair;with chair alarm set Nurse Communication: Mobility status PT Visit Diagnosis: Unsteadiness on feet (R26.81);Other  abnormalities of gait and mobility (R26.89);Muscle weakness (generalized) (M62.81)     Time: 5176-1607 PT Time Calculation (min) (ACUTE ONLY): 20 min  Charges:  $Therapeutic Exercise: 8-22 mins $Therapeutic Activity: 8-22 mins                     2:51 PM, 08/27/20 M. Shary Decamp, PT, DPT Physical Therapist- [place] Office Number: [phone]

## 2020-08-27 NOTE — Plan of Care (Signed)

## 2020-08-27 NOTE — Progress Notes (Signed)
PROGRESS NOTE    [name] Petersen  MHD:[id number] DOB: [birth year] DOA: 08/21/2020 PCP: Pcp, No   Brief Narrative:  Patient is a 87 year old overweight Caucasian female with a past medical history significant for but not limited to hypertension, hyperlipidemia, history of CVA, history of atherosclerotic PVD with ulceration status post Left BKA, constipation, macular degeneration, as well as other comorbidities who presented with a chief complaint of shortness of breath.  She complained of increasing shortness of breath which started on Sunday, 08/17/2020 and this was associated with nonproductive cough general body aches.  Some of the history was  obtained from her daughter who was at bedside and patient had a febrile 100.6 at her ALF.  At the facility she is given acetaminophen prior to arrival and had reportedly a sick contact in the facility.  Patient was noted to have shortness of breath with audible wheezing and given breathing treatments without improvement so EMS was called and she was brought to the ED for further evaluation.  She was given a nebulized breathing treatment in the ED and evaluated and was found to be tachypneic and tachycardic.  Chest x-ray showed no acute pulmonary disease but she is given a dose of Solu-Medrol, duo nebs and IV ceftriaxone.  She is also given a liter normal saline and Hospitalist was consulted for further admission. Of Note her influenza a and B and SARS-CoV-2 testing was negative.  Respiratory status is improving but still not back to baseline.  Patient continues to significantly wheeze.  She is now having some delirium so she will be placed on delirium precautions.  Of note she was unable to sleep last night so she was given Ambien and a daughter states that it had an resting reaction so we will not give any more Ambien.  She continues to wheeze significantly but is improving minimally.  Refused her Solu-Medrol the night before last because of her intermittent delirium.   Because of lack of improvement will reach out to pulmonary and respiratory virus panel done and POSITIVE for Metapneumovirus.  Pulmonary evaluated and recommending continue treatment with IV Solu-Medrol 60 mg daily: Continue Pulmicort twice a day as well as Xopenex and Atrovent nebs. Pulmonary recommending evaluating upper airway with swallowing test and recommending considering an ENT evaluation if bronchospasm persists.   Patient seems to be improving and swallow evaluation which showed mild aspiration risk.  Pulmonary recommends decreasing Solu-Medrol 40 mg every 12 hours and switching to oral prednisone 40 mg tomorrow with a slow taper over 2 to 3 weeks.  They recommend continuing Pulmicort twice daily as well as Xopenex and Atrovent nebs.  Anticipating discharging home with home health next 24 to 48 hours  Assessment & Plan:   Principal Problem:   Acute bronchitis Active Problems:   PAD (peripheral artery disease) (HCC)   S/P below knee amputation, left (HCC)   Essential hypertension   Stage 3 chronic kidney disease (HCC)   History of stroke   Hyperlipidemia  Dyspnea and shortness of breath in the setting of likely acute bronchitis with bronchospasm due to metapneumovirus, persistent and mildly improving -She was given breathing treatments at the ALF and with EMS without relief -She was placed in observation on MedSurg and initiated on Xopenex every 6-every 4 as needed monitor and will add Atrovent every 6 scheduled; have now added budesonide 0.5 mg p.o. twice daily -Incentive spirometry and flutter valve -Continue with Solu-Medrol 60 mg every 12 scheduled and pulmonary agrees with this C/n azithromycin  She received a dose of IV ceftriaxone and azithromycin in the ED C/n  budesonide and Brovana I will continue -Changed to Guaifenesin 1200 mg po BID -Still has not received her flutter valve or incentive spirometry -Continue to Monitor Respiratory Status Carefully -SpO2: 94 % O2 Flow  Rate (L/min): 1.5 L/min; she was placed on oxygen for a O2 saturation 91%; WEAN O2 and not wearing it today  -Given her persistent wheezing we will check a respiratory virus panel and this was positive for metapneumovirus.  Initially placed on droplet precautions but can be discontinued; of note she is negative for COVID and influenza A and B -Continuous pulse oximetry maintain O2 saturations greater than 90% -Has now resumed her PPI but now made it twice daily -D-dimer is elevated to 1.15 so obtain a CTA PE protocol -CTA showed "No evidence of pulmonary emboli. Scattered hepatic and renal cysts. Rounded density of the right breast which may represent a cyst. The need for further evaluation can be determined on a clinical basis given the patient's age. No prior exam is available for comparison. Aortic Atherosclerosis and Emphysema." -Given a Dose of IV Lasix 40 mg x1 this hospitalization  -Pulmonary was consulted for further evaluation recommendations and appreciate Dr. Reginia Naas input.  Dr. Vassie Loll recommends continuing IV Solu-Medrol 60 mg every 12 scheduled but now reducing to IV 40 mg BID today and then changing to po Prednisone 08/27/20 with a  Slow taper, Xopenex and Atrovent as well as budesonide and recommends evaluating upper airway and recommending a swallow test which only mild aspiration risk; Dr. Vassie Loll recommends considering ENT evaluation if bronchospasm persists. -PT/OT recommending Home Health at D/C -- Cough, wheezing and dyspnea improving -Continue IV Solu-Medrol bronchodilators--possible discharge home over the next 24 hours with oral steroids Home health  CKD stage IIIb Metabolic Acidosis  -BUNs/creatinine 21/1.19 and repeat BUN/creatinine was 20/1.26 yesterday and today is 29/1.27 -> 31/1.21 -> 32/1.24 -> 35/1.27 with a  GFR of 40 -Given a Liter of normal saline bolus in the ED and was placed on IV fluid maintenance but this was stopped and she was given a dose of IV Lasix  yesterday -Patient had a Metabolic Acidosis with a CO2 of 18, anion gap of 13, chloride level 105; now her CO2 is 26, anion gap of 10, chloride level is 137 -Avoid nephrotoxic medications, contrast dyes, hypotension and renally adjust medications -Continue to monitor and trend renal function and repeat CMP in a.m.  Hyperlipidemia -Continue with Pravastatin 10 mg po Daily  History of CVA PAD -Continue with Clopidogrel 75 mg p.o. daily as well as Pravastatin 10 mg po Daily  HTN -C/w Amlodipine 10 mg po Daily   Delirium ? Dementia with Sundowning, improving  -Easily reoriented but has had multiple changes the day before yesterday at ongoing to 3 different floors. Became agitated overnight. -Placed on delirium precautions -Daughter states that she was perseverating on things that she should not be but this is improved -Continue to Monitor closely   Hypophosphatemia -Patients Phos Level was 3.3 now -Continue to Monitor and Trend -Repeat Phos Level in the AM    DVT prophylaxis: Enoxaparin 40 mg sq q24h Code Status: FULL CODE Family Communication: Discussed with Daughter at bedside  Disposition Plan: Pending further clinical Improvement in Respiratory Status and clearance by Pulmonary; Anticipating D/C Home in the Next 24-48 hours  Status is: Inpatient  Remains inpatient appropriate because:Unsafe d/c plan, IV treatments appropriate due to intensity of illness or inability to take PO  and Inpatient level of care appropriate due to severity of illness   Dispo: The patient is from: Home              Anticipated d/c is to: Home              Patient currently is not medically stable to d/c.   Difficult to place patient No  Consultants:   Pulmonary Dr. Vassie Loll    Procedures: None  Antimicrobials:  Anti-infectives (From admission, onward)   Start     Dose/Rate Route Frequency Ordered Stop   08/23/20 1000  azithromycin (ZITHROMAX) tablet 250 mg  Status:  Discontinued       "Followed  by" Linked Group Details   250 mg Oral Daily 08/21/20 2348 08/23/20 0930   08/23/20 1000  azithromycin (ZITHROMAX) tablet 500 mg       "Followed by" Linked Group Details   500 mg Oral Daily 08/23/20 0930 08/26/20 0934   08/22/20 1000  azithromycin (ZITHROMAX) tablet 500 mg       "Followed by" Linked Group Details   500 mg Oral Daily 08/21/20 2348 08/22/20 0840   08/21/20 2045  cefTRIAXone (ROCEPHIN) 1 g in sodium chloride 0.9 % 100 mL IVPB        1 g 200 mL/hr over 30 Minutes Intravenous  Once 08/21/20 2040 08/21/20 2219        Subjective:  -Daughters are visiting -Cough, wheezing, shortness of breath improving slowly -  Objective: Vitals:   08/27/20 1314 08/27/20 1438 08/27/20 1921 08/27/20 1923  BP:  129/75    Pulse:  84    Resp:  17    Temp:  98.2 F (36.8 C)    TempSrc:      SpO2: 95% 92% 94% 94%  Weight:      Height:        Intake/Output Summary (Last 24 hours) at 08/27/2020 1957 Last data filed at 08/27/2020 1745 Gross per 24 hour  Intake 720 ml  Output 600 ml  Net 120 ml   Filed Weights   08/21/20 1957  Weight: 71.2 kg   Examination: Physical Exam:  Constitutional: WN/WD overweight elderly Caucasian female who is improved and is not in any acute distress.  She continues to wheeze but it is improving.  She appears calm Eyes: Lids and conjunctivae normal, sclerae anicteric  ENMT: External Ears, Nose appear normal. Grossly normal hearing.  Neck: Appears normal, supple, no cervical masses, normal ROM, no appreciable thyromegaly; no JVD Respiratory: Diminished to auscultation bilaterally with coarse breath sounds and some expiratory wheezing.   Cardiovascular: RRR, no murmurs / rubs / gallops. S1 and S2 auscultated.   Abdomen: Soft, non-tender, distended secondary body habitus. Bowel sounds positive.  GU: Deferred. Musculoskeletal:   Lt BKA Skin: No rashes, lesions, ulcers on limited skin evaluation. No induration; Warm and dry.  Neurologic: CN 2-12 grossly  intact with no focal deficits. Romberg sign and cerebellar reflexes not assessed.  Psychiatric: Normal judgment and insight. Alert and oriented x 3. Normal mood and appropriate affect.  Occasional episodes of confusion and disorientation  Data Reviewed: I have personally reviewed following labs and imaging studies  CBC: Recent Labs  Lab 08/23/20 0646 08/24/20 0343 08/25/20 0452 08/26/20 0529 08/27/20 0447  WBC 10.0 9.5 7.0 7.5 8.2  NEUTROABS 7.9* 7.3 5.5 6.1 6.8  HGB 14.0 14.0 15.0 15.2* 15.1*  HCT 42.3 43.5 45.6 45.3 45.8  MCV 91.0 91.8 90.1 88.8 89.8  PLT 223 231 235 234 249  Basic Metabolic Panel: Recent Labs  Lab 08/23/20 0646 08/24/20 0343 08/25/20 0452 08/26/20 0529 08/27/20 0447  NA 136 138 136 137 136  K 3.9 4.4 3.9 3.9 4.0  CL 103 105 100 101 101  CO2 GLUCOSE 165* 141* 186* 208* 222*  BUN 29* 31* 32* 35* 40*  CREATININE 1.27* 1.21* 1.24* 1.27* 1.31*  CALCIUM 8.7* 8.8* 9.0 9.0 9.0  MG 1.9 2.2 2.1 2.1 2.1  PHOS 3.3 3.3 3.4 3.3 3.3   GFR: Estimated Creatinine Clearance: 25.8 mL/min (A) (by C-G formula based on SCr of 1.31 mg/dL (H)). Liver Function Tests: Recent Labs  Lab 08/23/20 0646 08/24/20 0343 08/25/20 0452 08/26/20 0529 08/27/20 0447  AST 40 40 ALT ALKPHOS 52 49 51 49 45  BILITOT 0.6 0.6 0.8 0.8 0.7  PROT 6.1* 6.2* 6.7 6.3* 6.0*  ALBUMIN 3.4* 3.4* 3.6 3.5 3.3*   No results for input(s): LIPASE, AMYLASE in the last 168 hours. No results for input(s): AMMONIA in the last 168 hours. Coagulation Profile: Recent Labs  Lab 08/22/20 0433  INR 1.0   Cardiac Enzymes: No results for input(s): CKTOTAL, CKMB, CKMBINDEX, TROPONINI in the last 168 hours. BNP (last 3 results) No results for input(s): PROBNP in the last 8760 hours. HbA1C: No results for input(s): HGBA1C in the last 72 hours. CBG: No results for input(s): GLUCAP in the last 168 hours. Lipid Profile: No results for input(s): CHOL, HDL,  LDLCALC, TRIG, CHOLHDL, LDLDIRECT in the last 72 hours. Thyroid Function Tests: No results for input(s): TSH, T4TOTAL, FREET4, T3FREE, THYROIDAB in the last 72 hours. Anemia Panel: No results for input(s): VITAMINB12, FOLATE, FERRITIN, TIBC, IRON, RETICCTPCT in the last 72 hours. Sepsis Labs: Recent Labs  Lab 08/21/20 2059 08/21/20 2224  LATICACIDVEN 1.1 1.3    Recent Results (from the past 240 hour(s))  Resp Panel by RT-PCR (Flu A&B, Covid) Nasopharyngeal Swab     Status: None   Collection Time: 08/21/20  8:03 PM   Specimen: Nasopharyngeal Swab; Nasopharyngeal(NP) swabs in vial transport medium  Result Value Ref Range Status   SARS Coronavirus 2 by RT PCR NEGATIVE NEGATIVE Final    Comment: (NOTE) SARS-CoV-2 target nucleic acids are NOT DETECTED.  The SARS-CoV-2 RNA is generally detectable in upper respiratory specimens during the acute phase of infection. The lowest concentration of SARS-CoV-2 viral copies this assay can detect is 138 copies/mL. A negative result does not preclude SARS-Cov-2 infection and should not be used as the sole basis for treatment or other patient management decisions. A negative result may occur with  improper specimen collection/handling, submission of specimen other than nasopharyngeal swab, presence of viral mutation(s) within the areas targeted by this assay, and inadequate number of viral copies(<138 copies/mL). A negative result must be combined with clinical observations, patient history, and epidemiological information. The expected result is Negative.  Fact Sheet for Patients:  BloggerCourse.com  Fact Sheet for Healthcare Providers:  SeriousBroker.it  This test is no t yet approved or cleared by the [place] FDA and  has been authorized for detection and/or diagnosis of SARS-CoV-2 by FDA under an Emergency Use Authorization (EUA). This EUA will remain  in effect (meaning this test  can be used) for the duration of the COVID-19 declaration under Section 564(b)(1) of the Act, 21 U.S.C.section 360bbb-3(b)(1), unless the authorization is terminated  or revoked sooner.       Influenza A by PCR NEGATIVE  NEGATIVE Final   Influenza B by PCR NEGATIVE NEGATIVE Final    Comment: (NOTE) The Xpert Xpress SARS-CoV-2/FLU/RSV plus assay is intended as an aid in the diagnosis of influenza from Nasopharyngeal swab specimens and should not be used as a sole basis for treatment. Nasal washings and aspirates are unacceptable for Xpert Xpress SARS-CoV-2/FLU/RSV testing.  Fact Sheet for Patients: BloggerCourse.com  Fact Sheet for Healthcare Providers: SeriousBroker.it  This test is not yet approved or cleared by the [place] FDA and has been authorized for detection and/or diagnosis of SARS-CoV-2 by FDA under an Emergency Use Authorization (EUA). This EUA will remain in effect (meaning this test can be used) for the duration of the COVID-19 declaration under Section 564(b)(1) of the Act, 21 U.S.C. section 360bbb-3(b)(1), unless the authorization is terminated or revoked.  Performed at [facility], [address] [place], [place] [address]   Blood culture (routine x 2)     Status: None   Collection Time: 08/21/20  8:59 PM   Specimen: BLOOD  Result Value Ref Range Status   Specimen Description BLOOD LEFT ANTECUBITAL  Final   Special Requests   Final    BOTTLES DRAWN AEROBIC AND ANAEROBIC Blood Culture adequate volume   Culture   Final    NO GROWTH 5 DAYS Performed at [facility], [address] [place], [place] [address]    Report Status 08/26/2020 FINAL  Final  Blood culture (routine x 2)     Status: None   Collection Time: 08/21/20  8:59 PM   Specimen: BLOOD RIGHT HAND  Result Value Ref Range Status   Specimen Description BLOOD RIGHT HAND  Final   Special Requests   Final    BOTTLES DRAWN AEROBIC AND ANAEROBIC  Blood Culture adequate volume   Culture   Final    NO GROWTH 5 DAYS Performed at [facility], [address], [place], [place] [address]    Report Status 08/26/2020 FINAL  Final  Respiratory (~20 pathogens) panel by PCR     Status: Abnormal   Collection Time: 08/24/20 11:07 AM   Specimen: Nasopharyngeal Swab; Respiratory  Result Value Ref Range Status   Adenovirus NOT DETECTED NOT DETECTED Final   Coronavirus 229E NOT DETECTED NOT DETECTED Final    Comment: (NOTE) The Coronavirus on the Respiratory Panel, DOES NOT test for the novel  Coronavirus (2019 nCoV)    Coronavirus HKU1 NOT DETECTED NOT DETECTED Final   Coronavirus NL63 NOT DETECTED NOT DETECTED Final   Coronavirus OC43 NOT DETECTED NOT DETECTED Final   Metapneumovirus DETECTED (A) NOT DETECTED Final   Rhinovirus / Enterovirus NOT DETECTED NOT DETECTED Final   Influenza A NOT DETECTED NOT DETECTED Final   Influenza B NOT DETECTED NOT DETECTED Final   Parainfluenza Virus 1 NOT DETECTED NOT DETECTED Final   Parainfluenza Virus 2 NOT DETECTED NOT DETECTED Final   Parainfluenza Virus 3 NOT DETECTED NOT DETECTED Final   Parainfluenza Virus 4 NOT DETECTED NOT DETECTED Final   Respiratory Syncytial Virus NOT DETECTED NOT DETECTED Final   Bordetella pertussis NOT DETECTED NOT DETECTED Final   Bordetella Parapertussis NOT DETECTED NOT DETECTED Final   Chlamydophila pneumoniae NOT DETECTED NOT DETECTED Final   Mycoplasma pneumoniae NOT DETECTED NOT DETECTED Final    Comment: Performed at [facility], [address], [place], [place] [address]    RN Pressure Injury Documentation: Pressure Injury 07/10/17 serous filled blister (Active)  07/10/17 1845  Location: Heel  Location Orientation: Right;Distal  Staging:  Wound Description (Comments): serous filled blister  Present on Admission: Yes    Estimated body mass index is 28.71 kg/m as calculated from the following:   Height as of this encounter: 5\' 2"  (1.575 m).    Weight as of this encounter: 71.2 kg.  Malnutrition Type:   Malnutrition Characteristics:   Nutrition Interventions:    Radiology Studies: DG CHEST PORT 1 VIEW  Result Date: 08/27/2020 CLINICAL DATA:  Shortness of breath EXAM: PORTABLE CHEST 1 VIEW COMPARISON:  08/26/2020 FINDINGS: Increased markings in the lung bases could reflect atelectasis or scarring. Heart is normal size. No effusions or pneumothorax. No acute bony abnormality. IMPRESSION: Bibasilar scarring or atelectasis. Electronically Signed   By: Charlett NoseKevin  Dover M.D.   On: 08/27/2020 08:33   DG CHEST PORT 1 VIEW  Result Date: 08/26/2020 CLINICAL DATA:  Shortness of breath EXAM: PORTABLE CHEST 1 VIEW COMPARISON:  08/25/2020 FINDINGS: Heart is normal size. Aortic atherosclerosis. No confluent opacities or effusions. No acute bony abnormality. IMPRESSION: No active disease. Electronically Signed   By: Charlett NoseKevin  Dover M.D.   On: 08/26/2020 08:14   Scheduled Meds: . amLODipine  10 mg Oral Daily  . budesonide (PULMICORT) nebulizer solution  0.5 mg Nebulization BID  . Chlorhexidine Gluconate Cloth  6 each Topical Q0600  . clopidogrel  75 mg Oral Daily  . enoxaparin (LOVENOX) injection  30 mg Subcutaneous Q24H  . guaiFENesin  1,200 mg Oral BID  . ipratropium  0.5 mg Nebulization TID  . levalbuterol  0.63 mg Nebulization TID  . methylPREDNISolone (SOLU-MEDROL) injection  40 mg Intravenous Q12H  . mupirocin ointment  1 application Nasal BID  . pantoprazole  40 mg Oral BID  . pravastatin  10 mg Oral q1800   Continuous Infusions:   LOS: 4 days   Shon Haleourage Mariama Saintvil, MD [facility] PAGER is on AMION  If 7PM-7AM, please contact night-coverage [email or web address]

## 2020-08-28 LAB — CREATININE, SERUM
Creatinine, Ser: 1.23 mg/dL — ABNORMAL HIGH (ref 0.44–1.00)
GFR, Estimated: 41 mL/min — ABNORMAL LOW (ref >60)

## 2020-08-28 MED ORDER — SENNOSIDES-DOCUSATE SODIUM 8.6-50 MG PO TABS
2.0000 | ORAL_TABLET | Freq: Once | ORAL | Status: AC
  Administered 2020-08-28: 2 via ORAL
  Filled 2020-08-28: qty 2

## 2020-08-28 MED ORDER — ALBUTEROL SULFATE (2.5 MG/3ML) 0.083% IN NEBU: 2.5000 mg | INHALATION_SOLUTION | RESPIRATORY_TRACT | 12 refills | Status: DC | PRN

## 2020-08-28 MED ORDER — SENNOSIDES-DOCUSATE SODIUM 8.6-50 MG PO TABS: 2.0000 | ORAL_TABLET | Freq: Every day | ORAL | 3 refills | Status: DC

## 2020-08-28 MED ORDER — PREDNISONE 10 MG PO TABS: 10.0000 mg | ORAL_TABLET | ORAL | 0 refills | Status: DC

## 2020-08-28 MED ORDER — IPRATROPIUM BROMIDE 0.02 % IN SOLN: 0.5000 mg | Freq: Two times a day (BID) | RESPIRATORY_TRACT | Status: DC

## 2020-08-28 MED ORDER — LEVALBUTEROL HCL 0.63 MG/3ML IN NEBU: 0.6300 mg | INHALATION_SOLUTION | Freq: Two times a day (BID) | RESPIRATORY_TRACT | Status: DC

## 2020-08-28 MED ORDER — BISACODYL 10 MG RE SUPP
10.0000 mg | Freq: Once | RECTAL | Status: AC
  Administered 2020-08-28: 10 mg via RECTAL
  Filled 2020-08-28: qty 1

## 2020-08-28 MED ORDER — POLYETHYLENE GLYCOL 3350 17 G PO PACK: 17.0000 g | PACK | Freq: Every day | ORAL | 4 refills | Status: DC

## 2020-08-28 NOTE — Discharge Summary (Signed)
[name] Petersen, is a 87 y.o. female  DOB [birth year]  MRN [id number].  Admission date:  08/21/2020  Admitting Physician  Kerney Elbe, DO  Discharge Date:  08/28/2020   Primary MD  Pcp, No  Recommendations for primary care physician for things to follow:   1)Take Prednisone 40 mg (4 tab) daily for 4 days, then 30 mg (3 Tab) daily for 4 days, then 20 mg (2 Tab) daily for 4 days then 10 mg (1 tab) daily for 3 days and Stop 2) please note that some changes have been made to your medications including your blood pressure medications 3)Avoid ibuprofen/Advil/Aleve/Motrin/Goody Powders/Naproxen/BC powders/Meloxicam/Diclofenac/Indomethacin and other Nonsteroidal anti-inflammatory medications as these will make you more likely to bleed and can cause stomach ulcers, can also cause Kidney problems.    Admission Diagnosis  Acute bronchitis [J20.9] Acute bronchitis, unspecified organism [J20.9]   Discharge Diagnosis  Acute bronchitis [J20.9] Acute bronchitis, unspecified organism [J20.9]    Principal Problem:   Acute bronchitis Active Problems:   PAD (peripheral artery disease) (HCC)   S/P below knee amputation, left (HCC)   Essential hypertension   Stage 3 chronic kidney disease (HCC)   History of stroke   Hyperlipidemia      Past Medical History:  Diagnosis Date  . Arthritis    hands  . Atherosclerotic PVD with ulceration ([place]) 07/15/2017   RIGHT HEEL  . Complication of anesthesia    lost eyesight for a few days after surgery in the 1950's  . Constipation   . Diabetes mellitus without complication (HCC)    no medications   . Hypertension   . Macular degeneration, bilateral    "one eye wet; one eye dry; she gets shots" (07/07/2017)  . PAD (peripheral artery disease) ([place])   . Pneumonia   . Stroke [facility])    some trouble swallowing    Past Surgical History:  Procedure Laterality Date  .  ABDOMINAL AORTOGRAM W/LOWER EXTREMITY N/A 06/16/2017   Procedure: ABDOMINAL AORTOGRAM W/LOWER EXTREMITY;  Surgeon: Conrad Glenfield, MD;  Location: [facility];  Service: Cardiovascular;  Laterality: N/A;  lt extermity  . ABDOMINAL HYSTERECTOMY    . AMPUTATION Left 07/06/2017   Procedure: AMPUTATION BELOW KNEE LEFT;  Surgeon: Rosetta Posner, MD;  Location: [facility];  Service: Vascular;  Laterality: Left;  . CATARACT EXTRACTION W/ INTRAOCULAR LENS  IMPLANT, BILATERAL    . DILATION AND CURETTAGE OF UTERUS    . LOWER EXTREMITY ANGIOGRAPHY N/A 07/14/2017   Procedure: LOWER EXTREMITY ANGIOGRAPHY;  Surgeon: Angelia Mould, MD;  Location: [facility] [facility];  Service: Cardiovascular;  Laterality: N/A;     HPI  from the history and physical done on the day of admission:    Chief Complaint: Shortness of breath and fever  HPI: [name] Petersen is a 87 y.o. female with medical history significant for hypertension, hyperlipidemia, history of stroke who presents to the emergency department via EMS from [facility] due to shortness of breath.  Patient complained of  increasing shortness of breath which started on Sunday (5/1), this was associated with a nonproductive cough and generalized body aches.  Some of the history was obtained from daughter at bedside, per daughter, patient had a fever of 100.57F at the living facility and Tylenol was given prior to arrival, patient was also reported to have had sick contact at the living facility.  Shortness of breath was seen with audible wheezing sound, breathing treatment was given at the living facility without improvement, so EMS was activated, on arrival of EMS team, 1 nebulizer treatment was also provided and patient was taken to the ED for further evaluation and management.  Patient states that she already had 2 COVID-19 vaccines and a booster.  ED Course:  In the emergency department, she was tachypneic and tachycardic.  Work-up in the ED showed normal CBC  and BMP except for BUN/creatinine-21/1.19 (creatinine is within baseline range) and eGFR of 43.  Lactic acid was 1.1 > 1.3.  Influenza A, B and SARS coronavirus 2 was negative.   Chest x-ray showed no acute cardiopulmonary disease. She was treated with Solu-Medrol 125 mg x 1, DuoNeb and IV ceftriaxone.  IV hydration of 1 L NS was given.  Hospitalist was asked to admit patient for further evaluation and management     Hospital Course:   Dyspnea and shortness of breath in the setting of likely acute bronchitis with bronchospasm due to metapneumovirus ---Much improved  Pulmonology consult from Dr. Elsworth Soho appreciated  -She was treated with Solu-Medrol and  azithromycin , rocephin -Per pulmonologist okay to discharge on p.o. prednisone with slow taper C/n  budesonide and Brovana I will continue Continue mucolytic -Continue incentive spirometry -Hypoxia resolved -CTA showed "No evidence of pulmonary emboli. Scattered hepatic and renal cysts. Rounded density of the right breast which may represent a cyst. The need for further evaluation can be determined on a clinical basis given the patient's age. No prior exam is available for comparison. Aortic Atherosclerosis and Emphysema." -Bronchial spasms, wheezing, cough has improved significantly -PT/OT recommending Home Health at D/C   CKD stage IIIb Metabolic Acidosis  -BUNs/creatinine 21/1.19 and repeat BUN/creatinine was 20/1.26 yesterday and today is 29/1.27 -> 31/1.21 -> 32/1.24 -> 35/1.27 with a  GFR of 40 -Patient received IV fluid -Renal function appears back to baseline -Avoid nephrotoxic medications, contrast dyes, hypotension and renally adjust medications  History of CVA/PAD -Continue with Clopidogrel 75 mg p.o. daily as well as Pravastatin 10 mg po Daily  HTN -C/w Amlodipine 10 mg po Daily   Delirium with concerns for Dementia with Sundowning, improving  -Easily reoriented  -Mentation has improved significantly   Code  Status: FULL CODE Family Communication: Discussed with Daughters x 2 at bedside  Disposition Plan:  Discharge with home health to [facility] assisted living facility  Dispo: The patient is from: Home  Anticipated d/c is to: Home to ALF with home health services  Consultants:   Pulmonary Dr. Elsworth Soho    Discharge Condition: Stable without hypoxia  Diet and Activity recommendation:  As advised  Discharge Instructions     Discharge Instructions    Call MD for:  difficulty breathing, headache or visual disturbances   Complete by: As directed    Call MD for:  persistant dizziness or light-headedness   Complete by: As directed    Call MD for:  persistant nausea and vomiting   Complete by: As directed    Call MD for:  temperature >100.4   Complete by: As directed  Diet - low sodium heart healthy   Complete by: As directed    Discharge instructions   Complete by: As directed    1)Take Prednisone 40 mg (4 tab) daily for 4 days, then 30 mg (3 Tab) daily for 4 days, then 20 mg (2 Tab) daily for 4 days then 10 mg (1 tab) daily for 3 days and Stop 2) please note that some changes have been made to your medications including your blood pressure medications 3)Avoid ibuprofen/Advil/Aleve/Motrin/Goody Powders/Naproxen/BC powders/Meloxicam/Diclofenac/Indomethacin and other Nonsteroidal anti-inflammatory medications as these will make you more likely to bleed and can cause stomach ulcers, can also cause Kidney problems.   Increase activity slowly   Complete by: As directed         Discharge Medications     Allergies as of 08/28/2020      Reactions   Scopace [scopolamine] Other (See Comments)   Hallucinations      Medication List    STOP taking these medications   docusate sodium 100 MG capsule Commonly known as: COLACE   losartan-hydrochlorothiazide 50-12.5 MG tablet Commonly known as: HYZAAR   senna 8.6 MG Tabs tablet Commonly known as: SENOKOT     TAKE  these medications   acetaminophen 325 MG tablet Commonly known as: TYLENOL Take 1-2 tablets (325-650 mg total) by mouth every 6 (six) hours as needed for mild pain. What changed: Another medication with the same name was removed. Continue taking this medication, and follow the directions you see here.   albuterol (2.5 MG/3ML) 0.083% nebulizer solution Commonly known as: PROVENTIL Take 3 mLs (2.5 mg total) by nebulization every 4 (four) hours as needed for wheezing or shortness of breath (while awake).   amLODipine 10 MG tablet Commonly known as: NORVASC Take 1 tablet (10 mg total) by mouth daily. What changed: how much to take   beta carotene w/minerals tablet Take 1 tablet by mouth daily.   bisacodyl 5 MG EC tablet Commonly known as: DULCOLAX Take 5 mg by mouth daily as needed for moderate constipation.   clopidogrel 75 MG tablet Commonly known as: PLAVIX Take 75 mg by mouth every evening.   gabapentin 100 MG capsule Commonly known as: NEURONTIN Take 100 mg by mouth in the morning. What changed: Another medication with the same name was removed. Continue taking this medication, and follow the directions you see here.   guaiFENesin 600 MG 12 hr tablet Commonly known as: MUCINEX Take 600 mg by mouth 2 (two) times daily. For 2 weeks Started 08/19/2020 ends 09/05/2020   guaiFENesin 100 MG/5ML liquid Commonly known as: ROBITUSSIN Take 200 mg by mouth every 4 (four) hours as needed for cough.   lovastatin 20 MG tablet Commonly known as: MEVACOR Take 20 mg by mouth daily.   melatonin 3 MG Tabs tablet Take 3 mg by mouth at bedtime.   polyethylene glycol 17 g packet Commonly known as: MIRALAX / GLYCOLAX Take 17 g by mouth daily. What changed:   when to take this  reasons to take this   polyvinyl alcohol 1.4 % ophthalmic solution Commonly known as: LIQUIFILM TEARS Place 1 drop into both eyes as needed for dry eyes.   predniSONE 10 MG tablet Commonly known as:  DELTASONE Take 1 tablet (10 mg total) by mouth See admin instructions. Take 40 mg (4 tab) daily for 4 days, then 30 mg (3 Tab) daily for 4 days, then 20 mg (2 Tab) daily for 4 days then 10 mg (1 tab) daily for 3  days and stop What changed:   medication strength  how much to take  additional instructions   senna-docusate 8.6-50 MG tablet Commonly known as: Senokot-S Take 2 tablets by mouth at bedtime.   Valtrex 1000 MG tablet Generic drug: valACYclovir Take 1,000 mg by mouth in the morning, at noon, and at bedtime. x7DS started 08/14/2020 ends 08/21/2020   Vitamin D3 50 MCG (2000 UT) capsule Take 2,000 Units by mouth daily.   Voltaren 1 % Gel Generic drug: diclofenac Sodium Apply 2 g topically daily as needed (pain).       Major procedures and Radiology Reports - PLEASE review detailed and final reports for all details, in brief -   CT ANGIO CHEST PE W OR WO CONTRAST  Result Date: 08/23/2020 CLINICAL DATA:  Respiratory distress EXAM: CT ANGIOGRAPHY CHEST WITH CONTRAST TECHNIQUE: Multidetector CT imaging of the chest was performed using the standard protocol during bolus administration of intravenous contrast. Multiplanar CT image reconstructions and MIPs were obtained to evaluate the vascular anatomy. CONTRAST:  162mL OMNIPAQUE IOHEXOL 350 MG/ML SOLN COMPARISON:  Chest x-ray from earlier in the same day. FINDINGS: Cardiovascular: Atherosclerotic calcifications of the thoracic aorta and its branches are noted. No aneurysmal dilatation or dissection is noted. Heart is at the upper limits of normal in size. Coronary calcifications are noted. Soft noncalcified atherosclerotic plaque is noted in the descending thoracic aorta. The pulmonary artery shows a normal branching pattern bilaterally. No filling defect is noted to correspond with pulmonary emboli. Mediastinum/Nodes: Thoracic inlet is within normal limits. No sizable hilar or mediastinal adenopathy is noted. The esophagus visualized is  within limits. Lungs/Pleura: Mild emphysematous changes are noted. No focal infiltrate or sizable effusion is seen. No pneumothorax is noted. Upper Abdomen: Visualized upper abdomen shows multiple cystic structures within the liver. Right renal cyst is noted as well. Musculoskeletal: Degenerative changes of the thoracic spine are noted. 15 mm rounded density is noted in the right breast best seen on image number 104 of series 5. This may simply represent a cyst but is incompletely evaluated on this exam. Review of the MIP images confirms the above findings. IMPRESSION: No evidence of pulmonary emboli. Scattered hepatic and renal cysts. Rounded density of the right breast which may represent a cyst. The need for further evaluation can be determined on a clinical basis given the patient's age. No prior exam is available for comparison. Aortic Atherosclerosis (ICD10-I70.0) and Emphysema (ICD10-J43.9). Electronically Signed   By: Inez Catalina M.D.   On: 08/23/2020 16:43   DG CHEST PORT 1 VIEW  Result Date: 08/27/2020 CLINICAL DATA:  Shortness of breath EXAM: PORTABLE CHEST 1 VIEW COMPARISON:  08/26/2020 FINDINGS: Increased markings in the lung bases could reflect atelectasis or scarring. Heart is normal size. No effusions or pneumothorax. No acute bony abnormality. IMPRESSION: Bibasilar scarring or atelectasis. Electronically Signed   By: Rolm Baptise M.D.   On: 08/27/2020 08:33   DG CHEST PORT 1 VIEW  Result Date: 08/26/2020 CLINICAL DATA:  Shortness of breath EXAM: PORTABLE CHEST 1 VIEW COMPARISON:  08/25/2020 FINDINGS: Heart is normal size. Aortic atherosclerosis. No confluent opacities or effusions. No acute bony abnormality. IMPRESSION: No active disease. Electronically Signed   By: Rolm Baptise M.D.   On: 08/26/2020 08:14   DG CHEST PORT 1 VIEW  Result Date: 08/25/2020 CLINICAL DATA:  Shortness of breath, fever, history hypertension, diabetes mellitus, pneumonia EXAM: PORTABLE CHEST 1 VIEW COMPARISON:   Portable exam 0457 hours compared to 08/24/2020 FINDINGS: Normal heart size,  mediastinal contours, and pulmonary vascularity. Atherosclerotic calcification aorta. Minimal bibasilar atelectasis. Lungs otherwise clear. No acute infiltrate, pleural effusion, or pneumothorax. Bones demineralized. IMPRESSION: Minimal bibasilar atelectasis. Aortic Atherosclerosis (ICD10-I70.0). Electronically Signed   By: Lavonia Dana M.D.   On: 08/25/2020 08:09   DG CHEST PORT 1 VIEW  Result Date: 08/24/2020 CLINICAL DATA:  87 year old female with shortness of breath and fever. Negative for COVID-19 three days ago. EXAM: PORTABLE CHEST 1 VIEW COMPARISON:  CTA chest 08/23/2020 and earlier. FINDINGS: Portable AP semi upright view at 0440 hours. Stable lung volumes and mediastinal contours. Visualized tracheal air column is within normal limits. Allowing for portable technique the lungs are clear. No pneumothorax. No acute osseous abnormality identified. Paucity of bowel gas in the upper abdomen. IMPRESSION: No acute cardiopulmonary abnormality. Electronically Signed   By: Genevie Ann M.D.   On: 08/24/2020 05:12   DG CHEST PORT 1 VIEW  Result Date: 08/23/2020 CLINICAL DATA:  87 year old female with fever. Negative for COVID-19 2 days ago. EXAM: PORTABLE CHEST 1 VIEW COMPARISON:  Portable chest 08/21/2020 and earlier. FINDINGS: Portable AP semi upright view at 0449 hours. Lung volumes are stable and at the upper limits of normal. Stable cardiac size and mediastinal contours. Cardiac size at the upper limits of normal. Visualized tracheal air column is within normal limits. Allowing for portable technique the lungs are clear. No pneumothorax or pleural effusion. No acute osseous abnormality identified. IMPRESSION: Stable.  No acute cardiopulmonary abnormality. Electronically Signed   By: Genevie Ann M.D.   On: 08/23/2020 05:38   DG Chest Port 1 View  Result Date: 08/21/2020 CLINICAL DATA:  Cough and shortness of breath. EXAM: PORTABLE CHEST 1  VIEW COMPARISON:  April 18, 2007 FINDINGS: Chronic appearing increased interstitial lung markings are seen. There is no evidence of acute infiltrate, pleural effusion or pneumothorax. The heart size and mediastinal contours are within normal limits. Moderate to marked severity calcification of the aortic arch is noted. The visualized skeletal structures are unremarkable. IMPRESSION: No acute cardiopulmonary disease. Electronically Signed   By: Virgina Norfolk M.D.   On: 08/21/2020 21:28    Micro Results   Recent Results (from the past 240 hour(s))  Resp Panel by RT-PCR (Flu A&B, Covid) Nasopharyngeal Swab     Status: None   Collection Time: 08/21/20  8:03 PM   Specimen: Nasopharyngeal Swab; Nasopharyngeal(NP) swabs in vial transport medium  Result Value Ref Range Status   SARS Coronavirus 2 by RT PCR NEGATIVE NEGATIVE Final    Comment: (NOTE) SARS-CoV-2 target nucleic acids are NOT DETECTED.  The SARS-CoV-2 RNA is generally detectable in upper respiratory specimens during the acute phase of infection. The lowest concentration of SARS-CoV-2 viral copies this assay can detect is 138 copies/mL. A negative result does not preclude SARS-Cov-2 infection and should not be used as the sole basis for treatment or other patient management decisions. A negative result may occur with  improper specimen collection/handling, submission of specimen other than nasopharyngeal swab, presence of viral mutation(s) within the areas targeted by this assay, and inadequate number of viral copies(<138 copies/mL). A negative result must be combined with clinical observations, patient history, and epidemiological information. The expected result is Negative.  Fact Sheet for Patients:  EntrepreneurPulse.com.au  Fact Sheet for Healthcare Providers:  IncredibleEmployment.be  This test is no t yet approved or cleared by the [place] FDA and  has been authorized for  detection and/or diagnosis of SARS-CoV-2 by FDA under an Emergency Use Authorization (EUA). This  EUA will remain  in effect (meaning this test can be used) for the duration of the COVID-19 declaration under Section 564(b)(1) of the Act, 21 U.S.C.section 360bbb-3(b)(1), unless the authorization is terminated  or revoked sooner.       Influenza A by PCR NEGATIVE NEGATIVE Final   Influenza B by PCR NEGATIVE NEGATIVE Final    Comment: (NOTE) The Xpert Xpress SARS-CoV-2/FLU/RSV plus assay is intended as an aid in the diagnosis of influenza from Nasopharyngeal swab specimens and should not be used as a sole basis for treatment. Nasal washings and aspirates are unacceptable for Xpert Xpress SARS-CoV-2/FLU/RSV testing.  Fact Sheet for Patients: EntrepreneurPulse.com.au  Fact Sheet for Healthcare Providers: IncredibleEmployment.be  This test is not yet approved or cleared by the [place] FDA and has been authorized for detection and/or diagnosis of SARS-CoV-2 by FDA under an Emergency Use Authorization (EUA). This EUA will remain in effect (meaning this test can be used) for the duration of the COVID-19 declaration under Section 564(b)(1) of the Act, 21 U.S.C. section 360bbb-3(b)(1), unless the authorization is terminated or revoked.  Performed at [facility], [address] [place], [place] [address]   Blood culture (routine x 2)     Status: None   Collection Time: 08/21/20  8:59 PM   Specimen: BLOOD  Result Value Ref Range Status   Specimen Description BLOOD LEFT ANTECUBITAL  Final   Special Requests   Final    BOTTLES DRAWN AEROBIC AND ANAEROBIC Blood Culture adequate volume   Culture   Final    NO GROWTH 5 DAYS Performed at [facility], [address], [place], [place] [address]    Report Status 08/26/2020 FINAL  Final  Blood culture (routine x 2)     Status: None   Collection Time: 08/21/20  8:59 PM   Specimen: BLOOD RIGHT HAND   Result Value Ref Range Status   Specimen Description BLOOD RIGHT HAND  Final   Special Requests   Final    BOTTLES DRAWN AEROBIC AND ANAEROBIC Blood Culture adequate volume   Culture   Final    NO GROWTH 5 DAYS Performed at [facility], [address] [place], [place] [address]    Report Status 08/26/2020 FINAL  Final  Respiratory (~20 pathogens) panel by PCR     Status: Abnormal   Collection Time: 08/24/20 11:07 AM   Specimen: Nasopharyngeal Swab; Respiratory  Result Value Ref Range Status   Adenovirus NOT DETECTED NOT DETECTED Final   Coronavirus 229E NOT DETECTED NOT DETECTED Final    Comment: (NOTE) The Coronavirus on the Respiratory Panel, DOES NOT test for the novel  Coronavirus (2019 nCoV)    Coronavirus HKU1 NOT DETECTED NOT DETECTED Final   Coronavirus NL63 NOT DETECTED NOT DETECTED Final   Coronavirus OC43 NOT DETECTED NOT DETECTED Final   Metapneumovirus DETECTED (A) NOT DETECTED Final   Rhinovirus / Enterovirus NOT DETECTED NOT DETECTED Final   Influenza A NOT DETECTED NOT DETECTED Final   Influenza B NOT DETECTED NOT DETECTED Final   Parainfluenza Virus 1 NOT DETECTED NOT DETECTED Final   Parainfluenza Virus 2 NOT DETECTED NOT DETECTED Final   Parainfluenza Virus 3 NOT DETECTED NOT DETECTED Final   Parainfluenza Virus 4 NOT DETECTED NOT DETECTED Final   Respiratory Syncytial Virus NOT DETECTED NOT DETECTED Final   Bordetella pertussis NOT DETECTED NOT DETECTED Final   Bordetella Parapertussis NOT DETECTED NOT DETECTED Final   Chlamydophila pneumoniae NOT DETECTED NOT DETECTED Final   Mycoplasma pneumoniae  NOT DETECTED NOT DETECTED Final    Comment: Performed at [facility], [place] [address] [place], [place] [address]       Today   Subjective    [name] Petersen today has no new concerns, 2 daughters at bedside -Had BM -Cough wheezing and shortness of breath has improved significantly -No further hypoxia  Patient has been seen and examined prior to  discharge   Objective   Blood pressure (!) 141/76, pulse 64, temperature 97.6 F (36.4 C), temperature source Oral, resp. rate 20, height $RemoveBe'5\' 2"'fvmmIyAog$  (1.575 m), weight 71.2 kg, SpO2 99 %.   Intake/Output Summary (Last 24 hours) at 08/28/2020 1241 Last data filed at 08/28/2020 0900 Gross per 24 hour  Intake 600 ml  Output 400 ml  Net 200 ml    Exam Gen:- Awake Alert, no acute distress, speaking in sentences HEENT:- [place].AT, No sclera icterus Neck-Supple Neck,No JVD,.  Lungs-improved air movement, few scattered wheezes no rhonchi no rales  CV- S1, S2 normal, regular Abd-  +ve B.Sounds, Abd Soft, No tenderness,    Extremity/Skin:- No  edema,   good pulses, left BKA Psych-affect is appropriate, oriented x3, episodes of forgetfulness with cognitive and memory concerns at times Neuro-generalized weakness, no new focal deficits, no tremors    Data Review   CBC w Diff:  Lab Results  Component Value Date   WBC 8.2 08/27/2020   HGB 15.1 (H) 08/27/2020   HCT 45.8 08/27/2020   PLT 249 08/27/2020   LYMPHOPCT 11 08/27/2020   MONOPCT 5 08/27/2020   EOSPCT 0 08/27/2020   BASOPCT 0 08/27/2020    CMP:  Lab Results  Component Value Date   NA 136 08/27/2020   K 4.0 08/27/2020   CL 101 08/27/2020   CO2 25 08/27/2020   BUN 40 (H) 08/27/2020   CREATININE 1.23 (H) 08/28/2020   PROT 6.0 (L) 08/27/2020   ALBUMIN 3.3 (L) 08/27/2020   BILITOT 0.7 08/27/2020   ALKPHOS 45 08/27/2020   AST 17 08/27/2020   ALT 23 08/27/2020  .   Total Discharge time is about 33 minutes  Roxan Hockey M.D on 08/28/2020 at 12:41 PM  Go to [email or web address] -  for contact info  [facility] - Office  [phone]

## 2020-08-28 NOTE — [place] FL2 (Signed)
[place] MEDICAID FL2 LEVEL OF CARE SCREENING TOOL     IDENTIFICATION  Patient Name: [name] Petersen Birthdate: [birth year] Sex: female Admission Date (Current Location): 08/21/2020  [facility] and [place] Number:  [employer] and Address:  [facility],  [address] [place] [address]      Provider Number:    Attending Physician Name and Address:  Shon Hale, MD  Relative Name and Phone Number:  Vivien Presto (Daughter)   [phone]    Current Level of Care: Hospital Recommended Level of Care: Assisted Living Facility Prior Approval Number:    Date Approved/Denied:   PASRR Number:    Discharge Plan: Domiciliary (Rest home)    Current Diagnoses: Patient Active Problem List   Diagnosis Date Noted  . Acute bronchitis 08/21/2020  . History of stroke 08/21/2020  . Hyperlipidemia 08/21/2020  . Urinary retention   . Phantom limb pain (HCC)   . Unilateral complete BKA, left, sequela (HCC)   . Slow transit constipation   . Acute lower UTI   . Atherosclerotic PVD with ulceration (HCC) 07/14/2017  . Pressure injury of right heel, stage 3 (HCC)   . Sleep disturbance   . Poor nutrition   . Hypokalemia   . Drug-induced constipation   . Essential hypertension   . Stage 3 chronic kidney disease (HCC)   . Postoperative pain   . Diabetes mellitus type 2 in obese (HCC)   . S/P below knee amputation, left (HCC) 07/10/2017  . PAD (peripheral artery disease) (HCC) 07/06/2017  . Atherosclerosis of native arteries of the extremities with ulceration (HCC) 06/16/2017    Orientation RESPIRATION BLADDER Height & Weight     Self,Time,Situation,Place  Normal Continent Weight: 156 lb 15.5 oz (71.2 kg) Height:  5\' 2"  (157.5 cm)  BEHAVIORAL SYMPTOMS/MOOD NEUROLOGICAL BOWEL NUTRITION STATUS      Continent Diet (heart healthy)  AMBULATORY STATUS COMMUNICATION OF NEEDS Skin   Limited Assist Verbally Normal                       Personal Care  Assistance Level of Assistance  Bathing,Feeding,Dressing Bathing Assistance: Limited assistance Feeding assistance: Independent Dressing Assistance: Limited assistance     Functional Limitations Info  Sight,Speech,Hearing Sight Info: Adequate Hearing Info: Adequate Speech Info: Adequate    SPECIAL CARE FACTORS FREQUENCY  OT (By licensed OT)     PT Frequency: 3x/week OT Frequency: 3x/week            Contractures Contractures Info: Not present    Additional Factors Info  Code Status,Allergies Code Status Info: Full Code Allergies Info: Scopace(Scopolamine)           Current Medications (08/28/2020):  This is the current hospital active medication list Current Facility-Administered Medications  Medication Dose Route Frequency Provider Last Rate Last Admin  . amLODipine (NORVASC) tablet 10 mg  10 mg Oral Daily Adefeso, Oladapo, DO   10 mg at 08/28/20 0850  . budesonide (PULMICORT) nebulizer solution 0.5 mg  0.5 mg Nebulization BID 10/28/20 Latif, DO   0.5 mg at 08/28/20 10/28/20  . Chlorhexidine Gluconate Cloth 2 % PADS 6 each  6 each Topical Q0600 [address], Asprogia   6 each at 08/26/20 10/26/20  . clopidogrel (PLAVIX) tablet 75 mg  75 mg Oral Daily Adefeso, Oladapo, DO   75 mg at 08/28/20 0851  . diphenhydrAMINE (BENADRYL) capsule 25 mg  25 mg Oral QHS PRN 10/28/20 [place], DO   25  mg at 08/24/20 2335  . enoxaparin (LOVENOX) injection 30 mg  30 mg Subcutaneous Q24H Adefeso, Oladapo, DO   30 mg at 08/28/20 0517  . guaiFENesin (MUCINEX) 12 hr tablet 1,200 mg  1,200 mg Oral BID Marguerita Merles [place], DO   1,200 mg at 08/28/20 7209  . ipratropium (ATROVENT) nebulizer solution 0.5 mg  0.5 mg Nebulization BID Emokpae, Courage, MD      . levalbuterol (XOPENEX) nebulizer solution 0.63 mg  0.63 mg Nebulization Q4H PRN Adefeso, Oladapo, DO      . levalbuterol (XOPENEX) nebulizer solution 0.63 mg  0.63 mg Nebulization BID Emokpae, Courage, MD      . methylPREDNISolone sodium  succinate (SOLU-MEDROL) 40 mg/mL injection 40 mg  40 mg Intravenous Q12H Oretha Milch, MD   40 mg at 08/28/20 0851  . mupirocin ointment (BACTROBAN) 2 % 1 application  1 application Nasal BID Marguerita Merles [place], [place]   1 application at 08/28/20 4709  . ondansetron (ZOFRAN) injection 4 mg  4 mg Intravenous Q6H PRN Sheikh, Omair Latif, DO      . pantoprazole (PROTONIX) EC tablet 40 mg  40 mg Oral BID Marguerita Merles [place], DO   40 mg at 08/28/20 0851  . pravastatin (PRAVACHOL) tablet 10 mg  10 mg Oral q1800 Adefeso, Oladapo, DO   10 mg at 08/27/20 1800     Discharge Medications: TAKE these medications   acetaminophen 325 MG tablet Commonly known as: TYLENOL Take 1-2 tablets (325-650 mg total) by mouth every 6 (six) hours as needed for mild pain. What changed: Another medication with the same name was removed. Continue taking this medication, and follow the directions you see here.   albuterol (2.5 MG/3ML) 0.083% nebulizer solution Commonly known as: PROVENTIL Take 3 mLs (2.5 mg total) by nebulization every 4 (four) hours as needed for wheezing or shortness of breath (while awake).   amLODipine 10 MG tablet Commonly known as: NORVASC Take 1 tablet (10 mg total) by mouth daily. What changed: how much to take   beta carotene w/minerals tablet Take 1 tablet by mouth daily.   bisacodyl 5 MG EC tablet Commonly known as: DULCOLAX Take 5 mg by mouth daily as needed for moderate constipation.   clopidogrel 75 MG tablet Commonly known as: PLAVIX Take 75 mg by mouth every evening.   gabapentin 100 MG capsule Commonly known as: NEURONTIN Take 100 mg by mouth in the morning. What changed: Another medication with the same name was removed. Continue taking this medication, and follow the directions you see here.   guaiFENesin 600 MG 12 hr tablet Commonly known as: MUCINEX Take 600 mg by mouth 2 (two) times daily. For 2 weeks Started 08/19/2020 ends 09/05/2020   guaiFENesin 100 MG/5ML  liquid Commonly known as: ROBITUSSIN Take 200 mg by mouth every 4 (four) hours as needed for cough.   lovastatin 20 MG tablet Commonly known as: MEVACOR Take 20 mg by mouth daily.   melatonin 3 MG Tabs tablet Take 3 mg by mouth at bedtime.   polyethylene glycol 17 g packet Commonly known as: MIRALAX / GLYCOLAX Take 17 g by mouth daily. What changed:   when to take this  reasons to take this   polyvinyl alcohol 1.4 % ophthalmic solution Commonly known as: LIQUIFILM TEARS Place 1 drop into both eyes as needed for dry eyes.   predniSONE 10 MG tablet Commonly known as: DELTASONE Take 1 tablet (10 mg total) by mouth See admin instructions. Take 40 mg (  4 tab) daily for 4 days, then 30 mg (3 Tab) daily for 4 days, then 20 mg (2 Tab) daily for 4 days then 10 mg (1 tab) daily for 3 days and stop What changed:   medication strength  how much to take  additional instructions   senna-docusate 8.6-50 MG tablet Commonly known as: Senokot-S Take 2 tablets by mouth at bedtime.   Valtrex 1000 MG tablet Generic drug: valACYclovir Take 1,000 mg by mouth in the morning, at noon, and at bedtime. x7DS started 08/14/2020 ends 08/21/2020   Vitamin D3 50 MCG (2000 UT) capsule Take 2,000 Units by mouth daily.   Voltaren 1 % Gel Generic drug: diclofenac Sodium Apply 2 g topically daily as needed (pain).       Relevant Imaging Results:  Relevant Lab Results:   Additional Information SSN: [id number].  Bryndan Bilyk, Juleen China, LCSW

## 2020-08-28 NOTE — Discharge Instructions (Signed)
1)Take Prednisone 40 mg (4 tab) daily for 4 days, then 30 mg (3 Tab) daily for 4 days, then 20 mg (2 Tab) daily for 4 days then 10 mg (1 tab) daily for 3 days and Stop 2) please note that some changes have been made to your medications including your blood pressure medications 3)Avoid ibuprofen/Advil/Aleve/Motrin/Goody Powders/Naproxen/BC powders/Meloxicam/Diclofenac/Indomethacin and other Nonsteroidal anti-inflammatory medications as these will make you more likely to bleed and can cause stomach ulcers, can also cause Kidney problems.

## 2020-08-28 NOTE — Care Management Important Message (Signed)
Important Message  Patient Details  Name: [name] Petersen MRN: [id number] Date of Birth: 01-May-1926   Medicare Important Message Given:  Yes     Corey Harold 08/28/2020, 10:52 AM

## 2020-09-08 DIAGNOSIS — I10 Essential (primary) hypertension: Secondary | ICD-10-CM | POA: Diagnosis not present

## 2020-09-08 DIAGNOSIS — B029 Zoster without complications: Secondary | ICD-10-CM | POA: Diagnosis not present

## 2020-09-08 DIAGNOSIS — G546 Phantom limb syndrome with pain: Secondary | ICD-10-CM | POA: Diagnosis not present

## 2020-09-08 DIAGNOSIS — G63 Polyneuropathy in diseases classified elsewhere: Secondary | ICD-10-CM | POA: Diagnosis not present

## 2020-09-09 DIAGNOSIS — B351 Tinea unguium: Secondary | ICD-10-CM | POA: Diagnosis not present

## 2020-09-09 DIAGNOSIS — M79674 Pain in right toe(s): Secondary | ICD-10-CM | POA: Diagnosis not present

## 2020-09-12 DIAGNOSIS — E1151 Type 2 diabetes mellitus with diabetic peripheral angiopathy without gangrene: Secondary | ICD-10-CM | POA: Diagnosis not present

## 2020-09-12 DIAGNOSIS — J439 Emphysema, unspecified: Secondary | ICD-10-CM | POA: Diagnosis not present

## 2020-09-12 DIAGNOSIS — E1122 Type 2 diabetes mellitus with diabetic chronic kidney disease: Secondary | ICD-10-CM | POA: Diagnosis not present

## 2020-09-12 DIAGNOSIS — J209 Acute bronchitis, unspecified: Secondary | ICD-10-CM | POA: Diagnosis not present

## 2020-09-12 DIAGNOSIS — G546 Phantom limb syndrome with pain: Secondary | ICD-10-CM | POA: Diagnosis not present

## 2020-09-12 DIAGNOSIS — I70203 Unspecified atherosclerosis of native arteries of extremities, bilateral legs: Secondary | ICD-10-CM | POA: Diagnosis not present

## 2020-09-17 DIAGNOSIS — I70293 Other atherosclerosis of native arteries of extremities, bilateral legs: Secondary | ICD-10-CM | POA: Diagnosis not present

## 2020-09-17 DIAGNOSIS — I10 Essential (primary) hypertension: Secondary | ICD-10-CM | POA: Diagnosis not present

## 2020-09-17 DIAGNOSIS — I739 Peripheral vascular disease, unspecified: Secondary | ICD-10-CM | POA: Diagnosis not present

## 2020-09-17 DIAGNOSIS — G63 Polyneuropathy in diseases classified elsewhere: Secondary | ICD-10-CM | POA: Diagnosis not present

## 2020-09-17 DIAGNOSIS — G3184 Mild cognitive impairment, so stated: Secondary | ICD-10-CM | POA: Diagnosis not present

## 2020-09-17 DIAGNOSIS — N1832 Chronic kidney disease, stage 3b: Secondary | ICD-10-CM | POA: Diagnosis not present

## 2020-09-17 DIAGNOSIS — H538 Other visual disturbances: Secondary | ICD-10-CM | POA: Diagnosis not present

## 2020-09-17 DIAGNOSIS — J209 Acute bronchitis, unspecified: Secondary | ICD-10-CM | POA: Diagnosis not present

## 2020-09-17 DIAGNOSIS — M19011 Primary osteoarthritis, right shoulder: Secondary | ICD-10-CM | POA: Diagnosis not present

## 2020-09-17 DIAGNOSIS — E1142 Type 2 diabetes mellitus with diabetic polyneuropathy: Secondary | ICD-10-CM | POA: Diagnosis not present

## 2020-09-17 DIAGNOSIS — G546 Phantom limb syndrome with pain: Secondary | ICD-10-CM | POA: Diagnosis not present

## 2020-09-26 DIAGNOSIS — N1832 Chronic kidney disease, stage 3b: Secondary | ICD-10-CM | POA: Diagnosis not present

## 2020-09-26 DIAGNOSIS — E1142 Type 2 diabetes mellitus with diabetic polyneuropathy: Secondary | ICD-10-CM | POA: Diagnosis not present

## 2020-10-01 DIAGNOSIS — M19011 Primary osteoarthritis, right shoulder: Secondary | ICD-10-CM | POA: Diagnosis not present

## 2020-10-01 DIAGNOSIS — G63 Polyneuropathy in diseases classified elsewhere: Secondary | ICD-10-CM | POA: Diagnosis not present

## 2020-10-01 DIAGNOSIS — I10 Essential (primary) hypertension: Secondary | ICD-10-CM | POA: Diagnosis not present

## 2020-10-01 DIAGNOSIS — K5900 Constipation, unspecified: Secondary | ICD-10-CM | POA: Diagnosis not present

## 2020-10-01 DIAGNOSIS — I70293 Other atherosclerosis of native arteries of extremities, bilateral legs: Secondary | ICD-10-CM | POA: Diagnosis not present

## 2020-10-01 DIAGNOSIS — I739 Peripheral vascular disease, unspecified: Secondary | ICD-10-CM | POA: Diagnosis not present

## 2020-10-01 DIAGNOSIS — E1142 Type 2 diabetes mellitus with diabetic polyneuropathy: Secondary | ICD-10-CM | POA: Diagnosis not present

## 2020-10-01 DIAGNOSIS — N1832 Chronic kidney disease, stage 3b: Secondary | ICD-10-CM | POA: Diagnosis not present

## 2020-10-01 DIAGNOSIS — Z0000 Encounter for general adult medical examination without abnormal findings: Secondary | ICD-10-CM | POA: Diagnosis not present

## 2020-10-01 DIAGNOSIS — G546 Phantom limb syndrome with pain: Secondary | ICD-10-CM | POA: Diagnosis not present

## 2020-10-03 DIAGNOSIS — G546 Phantom limb syndrome with pain: Secondary | ICD-10-CM | POA: Diagnosis not present

## 2020-10-03 DIAGNOSIS — J209 Acute bronchitis, unspecified: Secondary | ICD-10-CM | POA: Diagnosis not present

## 2020-10-03 DIAGNOSIS — J439 Emphysema, unspecified: Secondary | ICD-10-CM | POA: Diagnosis not present

## 2020-10-08 DIAGNOSIS — G63 Polyneuropathy in diseases classified elsewhere: Secondary | ICD-10-CM | POA: Diagnosis not present

## 2020-10-08 DIAGNOSIS — N1832 Chronic kidney disease, stage 3b: Secondary | ICD-10-CM | POA: Diagnosis not present

## 2020-10-08 DIAGNOSIS — I10 Essential (primary) hypertension: Secondary | ICD-10-CM | POA: Diagnosis not present

## 2020-10-08 DIAGNOSIS — I70293 Other atherosclerosis of native arteries of extremities, bilateral legs: Secondary | ICD-10-CM | POA: Diagnosis not present

## 2020-10-08 DIAGNOSIS — R600 Localized edema: Secondary | ICD-10-CM | POA: Diagnosis not present

## 2020-10-08 DIAGNOSIS — I739 Peripheral vascular disease, unspecified: Secondary | ICD-10-CM | POA: Diagnosis not present

## 2020-10-08 DIAGNOSIS — M19011 Primary osteoarthritis, right shoulder: Secondary | ICD-10-CM | POA: Diagnosis not present

## 2020-10-08 DIAGNOSIS — G546 Phantom limb syndrome with pain: Secondary | ICD-10-CM | POA: Diagnosis not present

## 2020-10-08 DIAGNOSIS — E1142 Type 2 diabetes mellitus with diabetic polyneuropathy: Secondary | ICD-10-CM | POA: Diagnosis not present

## 2020-10-10 DIAGNOSIS — E1142 Type 2 diabetes mellitus with diabetic polyneuropathy: Secondary | ICD-10-CM | POA: Diagnosis not present

## 2020-10-15 DIAGNOSIS — H6121 Impacted cerumen, right ear: Secondary | ICD-10-CM | POA: Diagnosis not present

## 2020-10-15 DIAGNOSIS — G546 Phantom limb syndrome with pain: Secondary | ICD-10-CM | POA: Diagnosis not present

## 2020-10-15 DIAGNOSIS — H6122 Impacted cerumen, left ear: Secondary | ICD-10-CM | POA: Diagnosis not present

## 2020-10-15 DIAGNOSIS — M19011 Primary osteoarthritis, right shoulder: Secondary | ICD-10-CM | POA: Diagnosis not present

## 2020-10-15 DIAGNOSIS — N1832 Chronic kidney disease, stage 3b: Secondary | ICD-10-CM | POA: Diagnosis not present

## 2020-10-15 DIAGNOSIS — I129 Hypertensive chronic kidney disease with stage 1 through stage 4 chronic kidney disease, or unspecified chronic kidney disease: Secondary | ICD-10-CM | POA: Diagnosis not present

## 2020-10-15 DIAGNOSIS — E1142 Type 2 diabetes mellitus with diabetic polyneuropathy: Secondary | ICD-10-CM | POA: Diagnosis not present

## 2020-10-15 DIAGNOSIS — I7090 Unspecified atherosclerosis: Secondary | ICD-10-CM | POA: Diagnosis not present

## 2020-10-29 DIAGNOSIS — G63 Polyneuropathy in diseases classified elsewhere: Secondary | ICD-10-CM | POA: Diagnosis not present

## 2020-10-29 DIAGNOSIS — M19011 Primary osteoarthritis, right shoulder: Secondary | ICD-10-CM | POA: Diagnosis not present

## 2020-10-29 DIAGNOSIS — R600 Localized edema: Secondary | ICD-10-CM | POA: Diagnosis not present

## 2020-10-29 DIAGNOSIS — E1142 Type 2 diabetes mellitus with diabetic polyneuropathy: Secondary | ICD-10-CM | POA: Diagnosis not present

## 2020-10-29 DIAGNOSIS — N1832 Chronic kidney disease, stage 3b: Secondary | ICD-10-CM | POA: Diagnosis not present

## 2020-10-29 DIAGNOSIS — I70293 Other atherosclerosis of native arteries of extremities, bilateral legs: Secondary | ICD-10-CM | POA: Diagnosis not present

## 2020-10-29 DIAGNOSIS — G546 Phantom limb syndrome with pain: Secondary | ICD-10-CM | POA: Diagnosis not present

## 2020-10-29 DIAGNOSIS — I10 Essential (primary) hypertension: Secondary | ICD-10-CM | POA: Diagnosis not present

## 2020-10-29 DIAGNOSIS — K5900 Constipation, unspecified: Secondary | ICD-10-CM | POA: Diagnosis not present

## 2020-10-30 DIAGNOSIS — H353231 Exudative age-related macular degeneration, bilateral, with active choroidal neovascularization: Secondary | ICD-10-CM | POA: Diagnosis not present

## 2020-10-30 DIAGNOSIS — H353211 Exudative age-related macular degeneration, right eye, with active choroidal neovascularization: Secondary | ICD-10-CM | POA: Diagnosis not present

## 2020-10-30 DIAGNOSIS — H31012 Macula scars of posterior pole (postinflammatory) (post-traumatic), left eye: Secondary | ICD-10-CM | POA: Diagnosis not present

## 2020-10-30 DIAGNOSIS — B029 Zoster without complications: Secondary | ICD-10-CM | POA: Diagnosis not present

## 2020-10-30 DIAGNOSIS — H353221 Exudative age-related macular degeneration, left eye, with active choroidal neovascularization: Secondary | ICD-10-CM | POA: Diagnosis not present

## 2020-10-31 DIAGNOSIS — N1832 Chronic kidney disease, stage 3b: Secondary | ICD-10-CM | POA: Diagnosis not present

## 2020-11-07 DIAGNOSIS — E875 Hyperkalemia: Secondary | ICD-10-CM | POA: Diagnosis not present

## 2020-11-12 DIAGNOSIS — I10 Essential (primary) hypertension: Secondary | ICD-10-CM | POA: Diagnosis not present

## 2020-11-12 DIAGNOSIS — K5900 Constipation, unspecified: Secondary | ICD-10-CM | POA: Diagnosis not present

## 2020-11-12 DIAGNOSIS — N1832 Chronic kidney disease, stage 3b: Secondary | ICD-10-CM | POA: Diagnosis not present

## 2020-11-12 DIAGNOSIS — G63 Polyneuropathy in diseases classified elsewhere: Secondary | ICD-10-CM | POA: Diagnosis not present

## 2020-11-12 DIAGNOSIS — I70293 Other atherosclerosis of native arteries of extremities, bilateral legs: Secondary | ICD-10-CM | POA: Diagnosis not present

## 2020-11-12 DIAGNOSIS — E875 Hyperkalemia: Secondary | ICD-10-CM | POA: Diagnosis not present

## 2020-11-12 DIAGNOSIS — E1142 Type 2 diabetes mellitus with diabetic polyneuropathy: Secondary | ICD-10-CM | POA: Diagnosis not present

## 2020-11-12 DIAGNOSIS — R600 Localized edema: Secondary | ICD-10-CM | POA: Diagnosis not present

## 2020-11-12 DIAGNOSIS — M19011 Primary osteoarthritis, right shoulder: Secondary | ICD-10-CM | POA: Diagnosis not present

## 2020-11-14 DIAGNOSIS — R609 Edema, unspecified: Secondary | ICD-10-CM | POA: Diagnosis not present

## 2020-11-21 DIAGNOSIS — E875 Hyperkalemia: Secondary | ICD-10-CM | POA: Diagnosis not present

## 2020-12-02 DIAGNOSIS — M79674 Pain in right toe(s): Secondary | ICD-10-CM | POA: Diagnosis not present

## 2020-12-02 DIAGNOSIS — B351 Tinea unguium: Secondary | ICD-10-CM | POA: Diagnosis not present

## 2020-12-10 DIAGNOSIS — K5900 Constipation, unspecified: Secondary | ICD-10-CM | POA: Diagnosis not present

## 2020-12-10 DIAGNOSIS — I70293 Other atherosclerosis of native arteries of extremities, bilateral legs: Secondary | ICD-10-CM | POA: Diagnosis not present

## 2020-12-10 DIAGNOSIS — E875 Hyperkalemia: Secondary | ICD-10-CM | POA: Diagnosis not present

## 2020-12-10 DIAGNOSIS — R1032 Left lower quadrant pain: Secondary | ICD-10-CM | POA: Diagnosis not present

## 2020-12-10 DIAGNOSIS — Z89512 Acquired absence of left leg below knee: Secondary | ICD-10-CM | POA: Diagnosis not present

## 2020-12-10 DIAGNOSIS — M19011 Primary osteoarthritis, right shoulder: Secondary | ICD-10-CM | POA: Diagnosis not present

## 2020-12-10 DIAGNOSIS — R600 Localized edema: Secondary | ICD-10-CM | POA: Diagnosis not present

## 2020-12-10 DIAGNOSIS — E1142 Type 2 diabetes mellitus with diabetic polyneuropathy: Secondary | ICD-10-CM | POA: Diagnosis not present

## 2020-12-10 DIAGNOSIS — I130 Hypertensive heart and chronic kidney disease with heart failure and stage 1 through stage 4 chronic kidney disease, or unspecified chronic kidney disease: Secondary | ICD-10-CM | POA: Diagnosis not present

## 2020-12-10 DIAGNOSIS — N1832 Chronic kidney disease, stage 3b: Secondary | ICD-10-CM | POA: Diagnosis not present

## 2020-12-17 DIAGNOSIS — G546 Phantom limb syndrome with pain: Secondary | ICD-10-CM | POA: Diagnosis not present

## 2020-12-17 DIAGNOSIS — Z89512 Acquired absence of left leg below knee: Secondary | ICD-10-CM | POA: Diagnosis not present

## 2020-12-17 DIAGNOSIS — R600 Localized edema: Secondary | ICD-10-CM | POA: Diagnosis not present

## 2020-12-17 DIAGNOSIS — I130 Hypertensive heart and chronic kidney disease with heart failure and stage 1 through stage 4 chronic kidney disease, or unspecified chronic kidney disease: Secondary | ICD-10-CM | POA: Diagnosis not present

## 2020-12-17 DIAGNOSIS — N1832 Chronic kidney disease, stage 3b: Secondary | ICD-10-CM | POA: Diagnosis not present

## 2020-12-17 DIAGNOSIS — I70293 Other atherosclerosis of native arteries of extremities, bilateral legs: Secondary | ICD-10-CM | POA: Diagnosis not present

## 2020-12-17 DIAGNOSIS — M19011 Primary osteoarthritis, right shoulder: Secondary | ICD-10-CM | POA: Diagnosis not present

## 2020-12-17 DIAGNOSIS — E875 Hyperkalemia: Secondary | ICD-10-CM | POA: Diagnosis not present

## 2020-12-17 DIAGNOSIS — E1142 Type 2 diabetes mellitus with diabetic polyneuropathy: Secondary | ICD-10-CM | POA: Diagnosis not present

## 2021-01-07 DIAGNOSIS — Z89512 Acquired absence of left leg below knee: Secondary | ICD-10-CM | POA: Diagnosis not present

## 2021-01-07 DIAGNOSIS — E559 Vitamin D deficiency, unspecified: Secondary | ICD-10-CM | POA: Diagnosis not present

## 2021-01-07 DIAGNOSIS — E875 Hyperkalemia: Secondary | ICD-10-CM | POA: Diagnosis not present

## 2021-01-07 DIAGNOSIS — E1122 Type 2 diabetes mellitus with diabetic chronic kidney disease: Secondary | ICD-10-CM | POA: Diagnosis not present

## 2021-01-07 DIAGNOSIS — G546 Phantom limb syndrome with pain: Secondary | ICD-10-CM | POA: Diagnosis not present

## 2021-01-07 DIAGNOSIS — M19011 Primary osteoarthritis, right shoulder: Secondary | ICD-10-CM | POA: Diagnosis not present

## 2021-01-07 DIAGNOSIS — E1142 Type 2 diabetes mellitus with diabetic polyneuropathy: Secondary | ICD-10-CM | POA: Diagnosis not present

## 2021-01-07 DIAGNOSIS — I70293 Other atherosclerosis of native arteries of extremities, bilateral legs: Secondary | ICD-10-CM | POA: Diagnosis not present

## 2021-01-07 DIAGNOSIS — N1832 Chronic kidney disease, stage 3b: Secondary | ICD-10-CM | POA: Diagnosis not present

## 2021-01-08 DIAGNOSIS — Z89512 Acquired absence of left leg below knee: Secondary | ICD-10-CM | POA: Diagnosis not present

## 2021-01-08 DIAGNOSIS — E1122 Type 2 diabetes mellitus with diabetic chronic kidney disease: Secondary | ICD-10-CM | POA: Diagnosis not present

## 2021-01-14 DIAGNOSIS — Z89512 Acquired absence of left leg below knee: Secondary | ICD-10-CM | POA: Diagnosis not present

## 2021-01-14 DIAGNOSIS — M19011 Primary osteoarthritis, right shoulder: Secondary | ICD-10-CM | POA: Diagnosis not present

## 2021-01-14 DIAGNOSIS — K5900 Constipation, unspecified: Secondary | ICD-10-CM | POA: Diagnosis not present

## 2021-01-14 DIAGNOSIS — E1142 Type 2 diabetes mellitus with diabetic polyneuropathy: Secondary | ICD-10-CM | POA: Diagnosis not present

## 2021-01-14 DIAGNOSIS — N1832 Chronic kidney disease, stage 3b: Secondary | ICD-10-CM | POA: Diagnosis not present

## 2021-01-14 DIAGNOSIS — E782 Mixed hyperlipidemia: Secondary | ICD-10-CM | POA: Diagnosis not present

## 2021-01-14 DIAGNOSIS — E559 Vitamin D deficiency, unspecified: Secondary | ICD-10-CM | POA: Diagnosis not present

## 2021-01-14 DIAGNOSIS — I70293 Other atherosclerosis of native arteries of extremities, bilateral legs: Secondary | ICD-10-CM | POA: Diagnosis not present

## 2021-01-14 DIAGNOSIS — G546 Phantom limb syndrome with pain: Secondary | ICD-10-CM | POA: Diagnosis not present

## 2021-01-14 DIAGNOSIS — R600 Localized edema: Secondary | ICD-10-CM | POA: Diagnosis not present

## 2021-01-14 DIAGNOSIS — E1122 Type 2 diabetes mellitus with diabetic chronic kidney disease: Secondary | ICD-10-CM | POA: Diagnosis not present

## 2021-01-14 DIAGNOSIS — I130 Hypertensive heart and chronic kidney disease with heart failure and stage 1 through stage 4 chronic kidney disease, or unspecified chronic kidney disease: Secondary | ICD-10-CM | POA: Diagnosis not present

## 2021-01-16 DIAGNOSIS — E1129 Type 2 diabetes mellitus with other diabetic kidney complication: Secondary | ICD-10-CM | POA: Diagnosis not present

## 2021-01-16 DIAGNOSIS — E559 Vitamin D deficiency, unspecified: Secondary | ICD-10-CM | POA: Diagnosis not present

## 2021-02-04 DIAGNOSIS — M19011 Primary osteoarthritis, right shoulder: Secondary | ICD-10-CM | POA: Diagnosis not present

## 2021-02-04 DIAGNOSIS — I70293 Other atherosclerosis of native arteries of extremities, bilateral legs: Secondary | ICD-10-CM | POA: Diagnosis not present

## 2021-02-04 DIAGNOSIS — N1832 Chronic kidney disease, stage 3b: Secondary | ICD-10-CM | POA: Diagnosis not present

## 2021-02-04 DIAGNOSIS — E1122 Type 2 diabetes mellitus with diabetic chronic kidney disease: Secondary | ICD-10-CM | POA: Diagnosis not present

## 2021-02-04 DIAGNOSIS — E1142 Type 2 diabetes mellitus with diabetic polyneuropathy: Secondary | ICD-10-CM | POA: Diagnosis not present

## 2021-02-04 DIAGNOSIS — G546 Phantom limb syndrome with pain: Secondary | ICD-10-CM | POA: Diagnosis not present

## 2021-02-04 DIAGNOSIS — I130 Hypertensive heart and chronic kidney disease with heart failure and stage 1 through stage 4 chronic kidney disease, or unspecified chronic kidney disease: Secondary | ICD-10-CM | POA: Diagnosis not present

## 2021-02-04 DIAGNOSIS — Z89512 Acquired absence of left leg below knee: Secondary | ICD-10-CM | POA: Diagnosis not present

## 2021-02-09 DIAGNOSIS — N390 Urinary tract infection, site not specified: Secondary | ICD-10-CM | POA: Diagnosis not present

## 2021-02-18 DIAGNOSIS — N1832 Chronic kidney disease, stage 3b: Secondary | ICD-10-CM | POA: Diagnosis not present

## 2021-02-18 DIAGNOSIS — I130 Hypertensive heart and chronic kidney disease with heart failure and stage 1 through stage 4 chronic kidney disease, or unspecified chronic kidney disease: Secondary | ICD-10-CM | POA: Diagnosis not present

## 2021-02-18 DIAGNOSIS — R400 Somnolence: Secondary | ICD-10-CM | POA: Diagnosis not present

## 2021-02-18 DIAGNOSIS — R600 Localized edema: Secondary | ICD-10-CM | POA: Diagnosis not present

## 2021-02-18 DIAGNOSIS — E1122 Type 2 diabetes mellitus with diabetic chronic kidney disease: Secondary | ICD-10-CM | POA: Diagnosis not present

## 2021-02-18 DIAGNOSIS — E1142 Type 2 diabetes mellitus with diabetic polyneuropathy: Secondary | ICD-10-CM | POA: Diagnosis not present

## 2021-02-18 DIAGNOSIS — M19011 Primary osteoarthritis, right shoulder: Secondary | ICD-10-CM | POA: Diagnosis not present

## 2021-02-18 DIAGNOSIS — G546 Phantom limb syndrome with pain: Secondary | ICD-10-CM | POA: Diagnosis not present

## 2021-02-18 DIAGNOSIS — Z89512 Acquired absence of left leg below knee: Secondary | ICD-10-CM | POA: Diagnosis not present

## 2021-02-18 DIAGNOSIS — I70293 Other atherosclerosis of native arteries of extremities, bilateral legs: Secondary | ICD-10-CM | POA: Diagnosis not present

## 2021-02-24 DIAGNOSIS — M79674 Pain in right toe(s): Secondary | ICD-10-CM | POA: Diagnosis not present

## 2021-02-24 DIAGNOSIS — B351 Tinea unguium: Secondary | ICD-10-CM | POA: Diagnosis not present

## 2021-02-27 DIAGNOSIS — E119 Type 2 diabetes mellitus without complications: Secondary | ICD-10-CM | POA: Diagnosis not present

## 2021-02-27 DIAGNOSIS — Z79899 Other long term (current) drug therapy: Secondary | ICD-10-CM | POA: Diagnosis not present

## 2021-03-04 DIAGNOSIS — R600 Localized edema: Secondary | ICD-10-CM | POA: Diagnosis not present

## 2021-03-04 DIAGNOSIS — M19011 Primary osteoarthritis, right shoulder: Secondary | ICD-10-CM | POA: Diagnosis not present

## 2021-03-04 DIAGNOSIS — G546 Phantom limb syndrome with pain: Secondary | ICD-10-CM | POA: Diagnosis not present

## 2021-03-04 DIAGNOSIS — E1122 Type 2 diabetes mellitus with diabetic chronic kidney disease: Secondary | ICD-10-CM | POA: Diagnosis not present

## 2021-03-04 DIAGNOSIS — E1142 Type 2 diabetes mellitus with diabetic polyneuropathy: Secondary | ICD-10-CM | POA: Diagnosis not present

## 2021-03-04 DIAGNOSIS — I130 Hypertensive heart and chronic kidney disease with heart failure and stage 1 through stage 4 chronic kidney disease, or unspecified chronic kidney disease: Secondary | ICD-10-CM | POA: Diagnosis not present

## 2021-03-04 DIAGNOSIS — N1832 Chronic kidney disease, stage 3b: Secondary | ICD-10-CM | POA: Diagnosis not present

## 2021-03-04 DIAGNOSIS — I70293 Other atherosclerosis of native arteries of extremities, bilateral legs: Secondary | ICD-10-CM | POA: Diagnosis not present

## 2021-03-04 DIAGNOSIS — Z89512 Acquired absence of left leg below knee: Secondary | ICD-10-CM | POA: Diagnosis not present

## 2021-03-05 DIAGNOSIS — B029 Zoster without complications: Secondary | ICD-10-CM | POA: Diagnosis not present

## 2021-03-05 DIAGNOSIS — H31012 Macula scars of posterior pole (postinflammatory) (post-traumatic), left eye: Secondary | ICD-10-CM | POA: Diagnosis not present

## 2021-03-05 DIAGNOSIS — H353221 Exudative age-related macular degeneration, left eye, with active choroidal neovascularization: Secondary | ICD-10-CM | POA: Diagnosis not present

## 2021-03-05 DIAGNOSIS — H26491 Other secondary cataract, right eye: Secondary | ICD-10-CM | POA: Diagnosis not present

## 2021-03-05 DIAGNOSIS — H353231 Exudative age-related macular degeneration, bilateral, with active choroidal neovascularization: Secondary | ICD-10-CM | POA: Diagnosis not present

## 2021-03-05 DIAGNOSIS — H353211 Exudative age-related macular degeneration, right eye, with active choroidal neovascularization: Secondary | ICD-10-CM | POA: Diagnosis not present

## 2021-03-05 DIAGNOSIS — H43813 Vitreous degeneration, bilateral: Secondary | ICD-10-CM | POA: Diagnosis not present

## 2021-03-20 DIAGNOSIS — Z79899 Other long term (current) drug therapy: Secondary | ICD-10-CM | POA: Diagnosis not present

## 2021-03-26 DIAGNOSIS — E1122 Type 2 diabetes mellitus with diabetic chronic kidney disease: Secondary | ICD-10-CM | POA: Diagnosis not present

## 2021-03-26 DIAGNOSIS — R600 Localized edema: Secondary | ICD-10-CM | POA: Diagnosis not present

## 2021-03-26 DIAGNOSIS — E1142 Type 2 diabetes mellitus with diabetic polyneuropathy: Secondary | ICD-10-CM | POA: Diagnosis not present

## 2021-03-26 DIAGNOSIS — I70293 Other atherosclerosis of native arteries of extremities, bilateral legs: Secondary | ICD-10-CM | POA: Diagnosis not present

## 2021-03-26 DIAGNOSIS — I130 Hypertensive heart and chronic kidney disease with heart failure and stage 1 through stage 4 chronic kidney disease, or unspecified chronic kidney disease: Secondary | ICD-10-CM | POA: Diagnosis not present

## 2021-03-26 DIAGNOSIS — N1832 Chronic kidney disease, stage 3b: Secondary | ICD-10-CM | POA: Diagnosis not present

## 2021-04-02 DIAGNOSIS — E1142 Type 2 diabetes mellitus with diabetic polyneuropathy: Secondary | ICD-10-CM | POA: Diagnosis not present

## 2021-04-02 DIAGNOSIS — G546 Phantom limb syndrome with pain: Secondary | ICD-10-CM | POA: Diagnosis not present

## 2021-04-02 DIAGNOSIS — M19011 Primary osteoarthritis, right shoulder: Secondary | ICD-10-CM | POA: Diagnosis not present

## 2021-04-23 DIAGNOSIS — Z8673 Personal history of transient ischemic attack (TIA), and cerebral infarction without residual deficits: Secondary | ICD-10-CM | POA: Diagnosis not present

## 2021-04-23 DIAGNOSIS — R600 Localized edema: Secondary | ICD-10-CM | POA: Diagnosis not present

## 2021-04-23 DIAGNOSIS — N1832 Chronic kidney disease, stage 3b: Secondary | ICD-10-CM | POA: Diagnosis not present

## 2021-04-23 DIAGNOSIS — E1122 Type 2 diabetes mellitus with diabetic chronic kidney disease: Secondary | ICD-10-CM | POA: Diagnosis not present

## 2021-04-23 DIAGNOSIS — I130 Hypertensive heart and chronic kidney disease with heart failure and stage 1 through stage 4 chronic kidney disease, or unspecified chronic kidney disease: Secondary | ICD-10-CM | POA: Diagnosis not present

## 2021-04-30 DIAGNOSIS — E1142 Type 2 diabetes mellitus with diabetic polyneuropathy: Secondary | ICD-10-CM | POA: Diagnosis not present

## 2021-04-30 DIAGNOSIS — M19011 Primary osteoarthritis, right shoulder: Secondary | ICD-10-CM | POA: Diagnosis not present

## 2021-04-30 DIAGNOSIS — G546 Phantom limb syndrome with pain: Secondary | ICD-10-CM | POA: Diagnosis not present

## 2021-05-12 DIAGNOSIS — B351 Tinea unguium: Secondary | ICD-10-CM | POA: Diagnosis not present

## 2021-05-12 DIAGNOSIS — M79674 Pain in right toe(s): Secondary | ICD-10-CM | POA: Diagnosis not present

## 2021-05-21 DIAGNOSIS — R600 Localized edema: Secondary | ICD-10-CM | POA: Diagnosis not present

## 2021-05-21 DIAGNOSIS — N1832 Chronic kidney disease, stage 3b: Secondary | ICD-10-CM | POA: Diagnosis not present

## 2021-05-21 DIAGNOSIS — E1122 Type 2 diabetes mellitus with diabetic chronic kidney disease: Secondary | ICD-10-CM | POA: Diagnosis not present

## 2021-05-21 DIAGNOSIS — I130 Hypertensive heart and chronic kidney disease with heart failure and stage 1 through stage 4 chronic kidney disease, or unspecified chronic kidney disease: Secondary | ICD-10-CM | POA: Diagnosis not present

## 2021-05-21 DIAGNOSIS — Z8673 Personal history of transient ischemic attack (TIA), and cerebral infarction without residual deficits: Secondary | ICD-10-CM | POA: Diagnosis not present

## 2021-05-21 DIAGNOSIS — E1142 Type 2 diabetes mellitus with diabetic polyneuropathy: Secondary | ICD-10-CM | POA: Diagnosis not present

## 2021-05-21 DIAGNOSIS — G546 Phantom limb syndrome with pain: Secondary | ICD-10-CM | POA: Diagnosis not present

## 2021-05-27 DIAGNOSIS — I129 Hypertensive chronic kidney disease with stage 1 through stage 4 chronic kidney disease, or unspecified chronic kidney disease: Secondary | ICD-10-CM | POA: Diagnosis not present

## 2021-05-27 DIAGNOSIS — E1122 Type 2 diabetes mellitus with diabetic chronic kidney disease: Secondary | ICD-10-CM | POA: Diagnosis not present

## 2021-05-28 DIAGNOSIS — R55 Syncope and collapse: Secondary | ICD-10-CM | POA: Diagnosis not present

## 2021-05-28 DIAGNOSIS — M19011 Primary osteoarthritis, right shoulder: Secondary | ICD-10-CM | POA: Diagnosis not present

## 2021-05-28 DIAGNOSIS — G63 Polyneuropathy in diseases classified elsewhere: Secondary | ICD-10-CM | POA: Diagnosis not present

## 2021-05-28 DIAGNOSIS — G546 Phantom limb syndrome with pain: Secondary | ICD-10-CM | POA: Diagnosis not present

## 2021-05-30 DIAGNOSIS — N390 Urinary tract infection, site not specified: Secondary | ICD-10-CM | POA: Diagnosis not present

## 2021-06-01 DIAGNOSIS — Z79899 Other long term (current) drug therapy: Secondary | ICD-10-CM | POA: Diagnosis not present

## 2021-06-01 DIAGNOSIS — R197 Diarrhea, unspecified: Secondary | ICD-10-CM | POA: Diagnosis not present

## 2021-06-04 DIAGNOSIS — H31012 Macula scars of posterior pole (postinflammatory) (post-traumatic), left eye: Secondary | ICD-10-CM | POA: Diagnosis not present

## 2021-06-04 DIAGNOSIS — Z961 Presence of intraocular lens: Secondary | ICD-10-CM | POA: Diagnosis not present

## 2021-06-04 DIAGNOSIS — H353211 Exudative age-related macular degeneration, right eye, with active choroidal neovascularization: Secondary | ICD-10-CM | POA: Diagnosis not present

## 2021-06-04 DIAGNOSIS — H353221 Exudative age-related macular degeneration, left eye, with active choroidal neovascularization: Secondary | ICD-10-CM | POA: Diagnosis not present

## 2021-06-04 DIAGNOSIS — H353231 Exudative age-related macular degeneration, bilateral, with active choroidal neovascularization: Secondary | ICD-10-CM | POA: Diagnosis not present

## 2021-06-05 DIAGNOSIS — Z79899 Other long term (current) drug therapy: Secondary | ICD-10-CM | POA: Diagnosis not present

## 2021-06-18 DIAGNOSIS — I130 Hypertensive heart and chronic kidney disease with heart failure and stage 1 through stage 4 chronic kidney disease, or unspecified chronic kidney disease: Secondary | ICD-10-CM | POA: Diagnosis not present

## 2021-06-18 DIAGNOSIS — N1832 Chronic kidney disease, stage 3b: Secondary | ICD-10-CM | POA: Diagnosis not present

## 2021-06-18 DIAGNOSIS — E1142 Type 2 diabetes mellitus with diabetic polyneuropathy: Secondary | ICD-10-CM | POA: Diagnosis not present

## 2021-06-18 DIAGNOSIS — E1122 Type 2 diabetes mellitus with diabetic chronic kidney disease: Secondary | ICD-10-CM | POA: Diagnosis not present

## 2021-06-18 DIAGNOSIS — M19011 Primary osteoarthritis, right shoulder: Secondary | ICD-10-CM | POA: Diagnosis not present

## 2021-06-18 DIAGNOSIS — G546 Phantom limb syndrome with pain: Secondary | ICD-10-CM | POA: Diagnosis not present

## 2021-06-18 DIAGNOSIS — Z8673 Personal history of transient ischemic attack (TIA), and cerebral infarction without residual deficits: Secondary | ICD-10-CM | POA: Diagnosis not present

## 2021-06-25 DIAGNOSIS — M19011 Primary osteoarthritis, right shoulder: Secondary | ICD-10-CM | POA: Diagnosis not present

## 2021-06-25 DIAGNOSIS — G546 Phantom limb syndrome with pain: Secondary | ICD-10-CM | POA: Diagnosis not present

## 2021-06-25 DIAGNOSIS — G63 Polyneuropathy in diseases classified elsewhere: Secondary | ICD-10-CM | POA: Diagnosis not present

## 2021-06-28 DIAGNOSIS — D519 Vitamin B12 deficiency anemia, unspecified: Secondary | ICD-10-CM | POA: Diagnosis not present

## 2021-06-28 DIAGNOSIS — Z79899 Other long term (current) drug therapy: Secondary | ICD-10-CM | POA: Diagnosis not present

## 2021-06-28 DIAGNOSIS — E559 Vitamin D deficiency, unspecified: Secondary | ICD-10-CM | POA: Diagnosis not present

## 2021-06-29 DIAGNOSIS — K5901 Slow transit constipation: Secondary | ICD-10-CM | POA: Diagnosis not present

## 2021-06-29 DIAGNOSIS — G546 Phantom limb syndrome with pain: Secondary | ICD-10-CM | POA: Diagnosis not present

## 2021-06-29 DIAGNOSIS — E1122 Type 2 diabetes mellitus with diabetic chronic kidney disease: Secondary | ICD-10-CM | POA: Diagnosis not present

## 2021-06-29 DIAGNOSIS — N1832 Chronic kidney disease, stage 3b: Secondary | ICD-10-CM | POA: Diagnosis not present

## 2021-06-29 DIAGNOSIS — I129 Hypertensive chronic kidney disease with stage 1 through stage 4 chronic kidney disease, or unspecified chronic kidney disease: Secondary | ICD-10-CM | POA: Diagnosis not present

## 2021-07-16 DIAGNOSIS — E559 Vitamin D deficiency, unspecified: Secondary | ICD-10-CM | POA: Diagnosis not present

## 2021-07-16 DIAGNOSIS — N1832 Chronic kidney disease, stage 3b: Secondary | ICD-10-CM | POA: Diagnosis not present

## 2021-07-16 DIAGNOSIS — E1122 Type 2 diabetes mellitus with diabetic chronic kidney disease: Secondary | ICD-10-CM | POA: Diagnosis not present

## 2021-07-16 DIAGNOSIS — I129 Hypertensive chronic kidney disease with stage 1 through stage 4 chronic kidney disease, or unspecified chronic kidney disease: Secondary | ICD-10-CM | POA: Diagnosis not present

## 2021-07-16 DIAGNOSIS — K5901 Slow transit constipation: Secondary | ICD-10-CM | POA: Diagnosis not present

## 2021-07-16 DIAGNOSIS — D513 Other dietary vitamin B12 deficiency anemia: Secondary | ICD-10-CM | POA: Diagnosis not present

## 2021-07-28 DIAGNOSIS — M79674 Pain in right toe(s): Secondary | ICD-10-CM | POA: Diagnosis not present

## 2021-07-28 DIAGNOSIS — B351 Tinea unguium: Secondary | ICD-10-CM | POA: Diagnosis not present

## 2021-08-13 DIAGNOSIS — M19011 Primary osteoarthritis, right shoulder: Secondary | ICD-10-CM | POA: Diagnosis not present

## 2021-08-13 DIAGNOSIS — N1832 Chronic kidney disease, stage 3b: Secondary | ICD-10-CM | POA: Diagnosis not present

## 2021-08-13 DIAGNOSIS — I10 Essential (primary) hypertension: Secondary | ICD-10-CM | POA: Diagnosis not present

## 2021-08-13 DIAGNOSIS — I129 Hypertensive chronic kidney disease with stage 1 through stage 4 chronic kidney disease, or unspecified chronic kidney disease: Secondary | ICD-10-CM | POA: Diagnosis not present

## 2021-08-13 DIAGNOSIS — E1122 Type 2 diabetes mellitus with diabetic chronic kidney disease: Secondary | ICD-10-CM | POA: Diagnosis not present

## 2021-08-13 DIAGNOSIS — K5901 Slow transit constipation: Secondary | ICD-10-CM | POA: Diagnosis not present

## 2021-08-13 DIAGNOSIS — E782 Mixed hyperlipidemia: Secondary | ICD-10-CM | POA: Diagnosis not present

## 2021-08-13 DIAGNOSIS — D513 Other dietary vitamin B12 deficiency anemia: Secondary | ICD-10-CM | POA: Diagnosis not present

## 2021-08-13 DIAGNOSIS — E559 Vitamin D deficiency, unspecified: Secondary | ICD-10-CM | POA: Diagnosis not present

## 2021-10-01 DIAGNOSIS — Z961 Presence of intraocular lens: Secondary | ICD-10-CM | POA: Diagnosis not present

## 2021-10-01 DIAGNOSIS — H31012 Macula scars of posterior pole (postinflammatory) (post-traumatic), left eye: Secondary | ICD-10-CM | POA: Diagnosis not present

## 2021-10-01 DIAGNOSIS — H353211 Exudative age-related macular degeneration, right eye, with active choroidal neovascularization: Secondary | ICD-10-CM | POA: Diagnosis not present

## 2021-10-01 DIAGNOSIS — H353221 Exudative age-related macular degeneration, left eye, with active choroidal neovascularization: Secondary | ICD-10-CM | POA: Diagnosis not present

## 2021-10-01 DIAGNOSIS — H353231 Exudative age-related macular degeneration, bilateral, with active choroidal neovascularization: Secondary | ICD-10-CM | POA: Diagnosis not present

## 2021-10-06 DIAGNOSIS — E559 Vitamin D deficiency, unspecified: Secondary | ICD-10-CM | POA: Diagnosis not present

## 2021-10-06 DIAGNOSIS — M19011 Primary osteoarthritis, right shoulder: Secondary | ICD-10-CM | POA: Diagnosis not present

## 2021-10-06 DIAGNOSIS — K5901 Slow transit constipation: Secondary | ICD-10-CM | POA: Diagnosis not present

## 2021-10-06 DIAGNOSIS — N1832 Chronic kidney disease, stage 3b: Secondary | ICD-10-CM | POA: Diagnosis not present

## 2021-10-06 DIAGNOSIS — D513 Other dietary vitamin B12 deficiency anemia: Secondary | ICD-10-CM | POA: Diagnosis not present

## 2021-10-06 DIAGNOSIS — E782 Mixed hyperlipidemia: Secondary | ICD-10-CM | POA: Diagnosis not present

## 2021-10-06 DIAGNOSIS — I129 Hypertensive chronic kidney disease with stage 1 through stage 4 chronic kidney disease, or unspecified chronic kidney disease: Secondary | ICD-10-CM | POA: Diagnosis not present

## 2021-10-06 DIAGNOSIS — E1122 Type 2 diabetes mellitus with diabetic chronic kidney disease: Secondary | ICD-10-CM | POA: Diagnosis not present

## 2021-10-06 DIAGNOSIS — H6121 Impacted cerumen, right ear: Secondary | ICD-10-CM | POA: Diagnosis not present

## 2021-10-13 DIAGNOSIS — M79674 Pain in right toe(s): Secondary | ICD-10-CM | POA: Diagnosis not present

## 2021-10-13 DIAGNOSIS — B351 Tinea unguium: Secondary | ICD-10-CM | POA: Diagnosis not present

## 2021-10-20 DIAGNOSIS — E1142 Type 2 diabetes mellitus with diabetic polyneuropathy: Secondary | ICD-10-CM | POA: Diagnosis not present

## 2021-10-20 DIAGNOSIS — R0789 Other chest pain: Secondary | ICD-10-CM | POA: Diagnosis not present

## 2021-10-20 DIAGNOSIS — M19011 Primary osteoarthritis, right shoulder: Secondary | ICD-10-CM | POA: Diagnosis not present

## 2021-10-20 DIAGNOSIS — G546 Phantom limb syndrome with pain: Secondary | ICD-10-CM | POA: Diagnosis not present

## 2021-10-21 DIAGNOSIS — R079 Chest pain, unspecified: Secondary | ICD-10-CM | POA: Diagnosis not present

## 2021-10-23 DIAGNOSIS — Z79899 Other long term (current) drug therapy: Secondary | ICD-10-CM | POA: Diagnosis not present

## 2021-11-06 DIAGNOSIS — R400 Somnolence: Secondary | ICD-10-CM | POA: Diagnosis not present

## 2021-11-06 DIAGNOSIS — I129 Hypertensive chronic kidney disease with stage 1 through stage 4 chronic kidney disease, or unspecified chronic kidney disease: Secondary | ICD-10-CM | POA: Diagnosis not present

## 2021-11-06 DIAGNOSIS — E1122 Type 2 diabetes mellitus with diabetic chronic kidney disease: Secondary | ICD-10-CM | POA: Diagnosis not present

## 2021-11-06 DIAGNOSIS — N1832 Chronic kidney disease, stage 3b: Secondary | ICD-10-CM | POA: Diagnosis not present

## 2021-11-06 DIAGNOSIS — G546 Phantom limb syndrome with pain: Secondary | ICD-10-CM | POA: Diagnosis not present

## 2021-11-10 DIAGNOSIS — I129 Hypertensive chronic kidney disease with stage 1 through stage 4 chronic kidney disease, or unspecified chronic kidney disease: Secondary | ICD-10-CM | POA: Diagnosis not present

## 2021-11-10 DIAGNOSIS — E1142 Type 2 diabetes mellitus with diabetic polyneuropathy: Secondary | ICD-10-CM | POA: Diagnosis not present

## 2021-11-10 DIAGNOSIS — K5901 Slow transit constipation: Secondary | ICD-10-CM | POA: Diagnosis not present

## 2021-11-10 DIAGNOSIS — E782 Mixed hyperlipidemia: Secondary | ICD-10-CM | POA: Diagnosis not present

## 2021-11-10 DIAGNOSIS — E1122 Type 2 diabetes mellitus with diabetic chronic kidney disease: Secondary | ICD-10-CM | POA: Diagnosis not present

## 2021-11-10 DIAGNOSIS — M19011 Primary osteoarthritis, right shoulder: Secondary | ICD-10-CM | POA: Diagnosis not present

## 2021-11-10 DIAGNOSIS — E559 Vitamin D deficiency, unspecified: Secondary | ICD-10-CM | POA: Diagnosis not present

## 2021-11-10 DIAGNOSIS — D513 Other dietary vitamin B12 deficiency anemia: Secondary | ICD-10-CM | POA: Diagnosis not present

## 2021-11-10 DIAGNOSIS — I10 Essential (primary) hypertension: Secondary | ICD-10-CM | POA: Diagnosis not present

## 2021-11-10 DIAGNOSIS — N1832 Chronic kidney disease, stage 3b: Secondary | ICD-10-CM | POA: Diagnosis not present

## 2021-11-16 DIAGNOSIS — D513 Other dietary vitamin B12 deficiency anemia: Secondary | ICD-10-CM | POA: Diagnosis not present

## 2021-11-16 DIAGNOSIS — I129 Hypertensive chronic kidney disease with stage 1 through stage 4 chronic kidney disease, or unspecified chronic kidney disease: Secondary | ICD-10-CM | POA: Diagnosis not present

## 2021-11-23 DIAGNOSIS — N390 Urinary tract infection, site not specified: Secondary | ICD-10-CM | POA: Diagnosis not present

## 2021-12-08 DIAGNOSIS — N390 Urinary tract infection, site not specified: Secondary | ICD-10-CM | POA: Diagnosis not present

## 2021-12-22 DIAGNOSIS — N1832 Chronic kidney disease, stage 3b: Secondary | ICD-10-CM | POA: Diagnosis not present

## 2021-12-22 DIAGNOSIS — E559 Vitamin D deficiency, unspecified: Secondary | ICD-10-CM | POA: Diagnosis not present

## 2021-12-22 DIAGNOSIS — M19011 Primary osteoarthritis, right shoulder: Secondary | ICD-10-CM | POA: Diagnosis not present

## 2021-12-22 DIAGNOSIS — D513 Other dietary vitamin B12 deficiency anemia: Secondary | ICD-10-CM | POA: Diagnosis not present

## 2021-12-22 DIAGNOSIS — E782 Mixed hyperlipidemia: Secondary | ICD-10-CM | POA: Diagnosis not present

## 2021-12-22 DIAGNOSIS — E1122 Type 2 diabetes mellitus with diabetic chronic kidney disease: Secondary | ICD-10-CM | POA: Diagnosis not present

## 2021-12-22 DIAGNOSIS — I10 Essential (primary) hypertension: Secondary | ICD-10-CM | POA: Diagnosis not present

## 2021-12-22 DIAGNOSIS — E1142 Type 2 diabetes mellitus with diabetic polyneuropathy: Secondary | ICD-10-CM | POA: Diagnosis not present

## 2021-12-22 DIAGNOSIS — K5901 Slow transit constipation: Secondary | ICD-10-CM | POA: Diagnosis not present

## 2022-01-05 DIAGNOSIS — B351 Tinea unguium: Secondary | ICD-10-CM | POA: Diagnosis not present

## 2022-01-05 DIAGNOSIS — M79674 Pain in right toe(s): Secondary | ICD-10-CM | POA: Diagnosis not present

## 2022-01-14 DIAGNOSIS — D513 Other dietary vitamin B12 deficiency anemia: Secondary | ICD-10-CM | POA: Diagnosis not present

## 2022-01-14 DIAGNOSIS — I129 Hypertensive chronic kidney disease with stage 1 through stage 4 chronic kidney disease, or unspecified chronic kidney disease: Secondary | ICD-10-CM | POA: Diagnosis not present

## 2022-02-05 DIAGNOSIS — K5901 Slow transit constipation: Secondary | ICD-10-CM | POA: Diagnosis not present

## 2022-02-05 DIAGNOSIS — M19011 Primary osteoarthritis, right shoulder: Secondary | ICD-10-CM | POA: Diagnosis not present

## 2022-02-05 DIAGNOSIS — I10 Essential (primary) hypertension: Secondary | ICD-10-CM | POA: Diagnosis not present

## 2022-02-05 DIAGNOSIS — E782 Mixed hyperlipidemia: Secondary | ICD-10-CM | POA: Diagnosis not present

## 2022-02-05 DIAGNOSIS — D513 Other dietary vitamin B12 deficiency anemia: Secondary | ICD-10-CM | POA: Diagnosis not present

## 2022-02-05 DIAGNOSIS — E1142 Type 2 diabetes mellitus with diabetic polyneuropathy: Secondary | ICD-10-CM | POA: Diagnosis not present

## 2022-02-05 DIAGNOSIS — E559 Vitamin D deficiency, unspecified: Secondary | ICD-10-CM | POA: Diagnosis not present

## 2022-02-23 DIAGNOSIS — D513 Other dietary vitamin B12 deficiency anemia: Secondary | ICD-10-CM | POA: Diagnosis not present

## 2022-02-23 DIAGNOSIS — E1142 Type 2 diabetes mellitus with diabetic polyneuropathy: Secondary | ICD-10-CM | POA: Diagnosis not present

## 2022-02-23 DIAGNOSIS — N1832 Chronic kidney disease, stage 3b: Secondary | ICD-10-CM | POA: Diagnosis not present

## 2022-02-23 DIAGNOSIS — I10 Essential (primary) hypertension: Secondary | ICD-10-CM | POA: Diagnosis not present

## 2022-02-23 DIAGNOSIS — K5901 Slow transit constipation: Secondary | ICD-10-CM | POA: Diagnosis not present

## 2022-02-23 DIAGNOSIS — M19011 Primary osteoarthritis, right shoulder: Secondary | ICD-10-CM | POA: Diagnosis not present

## 2022-02-23 DIAGNOSIS — E782 Mixed hyperlipidemia: Secondary | ICD-10-CM | POA: Diagnosis not present

## 2022-02-23 DIAGNOSIS — E559 Vitamin D deficiency, unspecified: Secondary | ICD-10-CM | POA: Diagnosis not present

## 2022-02-24 DIAGNOSIS — I129 Hypertensive chronic kidney disease with stage 1 through stage 4 chronic kidney disease, or unspecified chronic kidney disease: Secondary | ICD-10-CM | POA: Diagnosis not present

## 2022-02-24 DIAGNOSIS — D513 Other dietary vitamin B12 deficiency anemia: Secondary | ICD-10-CM | POA: Diagnosis not present

## 2022-02-26 DIAGNOSIS — Z79899 Other long term (current) drug therapy: Secondary | ICD-10-CM | POA: Diagnosis not present

## 2022-03-04 DIAGNOSIS — H353231 Exudative age-related macular degeneration, bilateral, with active choroidal neovascularization: Secondary | ICD-10-CM | POA: Diagnosis not present

## 2022-03-04 DIAGNOSIS — Z961 Presence of intraocular lens: Secondary | ICD-10-CM | POA: Diagnosis not present

## 2022-03-04 DIAGNOSIS — H31012 Macula scars of posterior pole (postinflammatory) (post-traumatic), left eye: Secondary | ICD-10-CM | POA: Diagnosis not present

## 2022-03-23 DIAGNOSIS — E559 Vitamin D deficiency, unspecified: Secondary | ICD-10-CM | POA: Diagnosis not present

## 2022-03-23 DIAGNOSIS — K5901 Slow transit constipation: Secondary | ICD-10-CM | POA: Diagnosis not present

## 2022-03-23 DIAGNOSIS — N1832 Chronic kidney disease, stage 3b: Secondary | ICD-10-CM | POA: Diagnosis not present

## 2022-03-23 DIAGNOSIS — E1142 Type 2 diabetes mellitus with diabetic polyneuropathy: Secondary | ICD-10-CM | POA: Diagnosis not present

## 2022-03-23 DIAGNOSIS — B351 Tinea unguium: Secondary | ICD-10-CM | POA: Diagnosis not present

## 2022-03-23 DIAGNOSIS — E782 Mixed hyperlipidemia: Secondary | ICD-10-CM | POA: Diagnosis not present

## 2022-03-23 DIAGNOSIS — D513 Other dietary vitamin B12 deficiency anemia: Secondary | ICD-10-CM | POA: Diagnosis not present

## 2022-03-23 DIAGNOSIS — I10 Essential (primary) hypertension: Secondary | ICD-10-CM | POA: Diagnosis not present

## 2022-03-23 DIAGNOSIS — M79674 Pain in right toe(s): Secondary | ICD-10-CM | POA: Diagnosis not present

## 2022-03-23 DIAGNOSIS — M19011 Primary osteoarthritis, right shoulder: Secondary | ICD-10-CM | POA: Diagnosis not present

## 2022-03-30 DIAGNOSIS — M79662 Pain in left lower leg: Secondary | ICD-10-CM | POA: Diagnosis not present

## 2022-03-30 DIAGNOSIS — M79661 Pain in right lower leg: Secondary | ICD-10-CM | POA: Diagnosis not present

## 2022-03-30 DIAGNOSIS — Z89512 Acquired absence of left leg below knee: Secondary | ICD-10-CM | POA: Diagnosis not present

## 2022-03-30 DIAGNOSIS — I739 Peripheral vascular disease, unspecified: Secondary | ICD-10-CM | POA: Diagnosis not present

## 2022-04-07 ENCOUNTER — Encounter: Payer: Self-pay | Admitting: Vascular Surgery

## 2022-04-07 ENCOUNTER — Ambulatory Visit: Payer: Medicare Other | Admitting: Vascular Surgery

## 2022-04-07 VITALS — BP 153/91 | HR 66 | Temp 98.4°F | Ht 63.0 in | Wt 156.0 lb

## 2022-04-07 DIAGNOSIS — I739 Peripheral vascular disease, unspecified: Secondary | ICD-10-CM | POA: Diagnosis not present

## 2022-04-07 NOTE — Progress Notes (Signed)
[facility] [place]  Patient name: [name] Petersen MRN: [id number] DOB: 02-15-1927 Sex: female  REASON FOR CONSULT: Evaluation right leg arterial flow  HPI: [name] Petersen is a 88 y.o. female, who is here today for evaluation of pain in her right great toe.  She is here today with her family.  She is well-known to me from prior treatment and 2019.  She had presented at that time with gangrene in her right foot.  Arteriography revealed complete occlusion of all tibial vessels with no runoff and her only option was below-knee amputation.  She did quite well with this.  She is in a wheelchair but reports that she can independently transfer from bed to chair.  He reports pain in the medial right great toenail reports that this is most noted at night when the sheets are resting on her toe.  There is no fever or signs of systemic sepsis.  She has no surrounding erythema.  Past Medical History:  Diagnosis Date   Arthritis    hands   Atherosclerotic PVD with ulceration (HCC) 07/15/2017   RIGHT HEEL   Complication of anesthesia    lost eyesight for a few days after surgery in the 1950's   Constipation    Diabetes mellitus without complication (HCC)    no medications    Hypertension    Macular degeneration, bilateral    "one eye wet; one eye dry; she gets shots" (07/07/2017)   PAD (peripheral artery disease) (HCC)    Pneumonia    Stroke (HCC)    some trouble swallowing    Family History  Adopted: Yes    SOCIAL HISTORY: Social History   Socioeconomic History   Marital status: Widowed    Spouse name: Not on file   Number of children: Not on file   Years of education: Not on file   Highest education level: Not on file  Occupational History   Not on file  Tobacco Use   Smoking status: Never   Smokeless tobacco: Never  Vaping Use   Vaping Use: Never used  Substance and Sexual Activity   Alcohol use: Never   Drug use: Never    Sexual activity: Not Currently    Comment: intercourse age 69,sexual patrners less than 5   Other Topics Concern   Not on file  Social History Narrative   Not on file   Social Determinants of Health   Financial Resource Strain: Not on file  Food Insecurity: Not on file  Transportation Needs: Not on file  Physical Activity: Not on file  Stress: Not on file  Social Connections: Not on file  Intimate Partner Violence: Not on file    Allergies  Allergen Reactions   Scopace [Scopolamine] Other (See Comments)    Hallucinations    Current Outpatient Medications  Medication Sig Dispense Refill   acetaminophen (TYLENOL) 325 MG tablet Take 1-2 tablets (325-650 mg total) by mouth every 6 (six) hours as needed for mild pain.     amLODipine (NORVASC) 10 MG tablet Take 1 tablet (10 mg total) by mouth daily. (Patient taking differently: Take 5 mg by mouth daily.) 30 tablet 0   beta carotene w/minerals (OCUVITE) tablet Take 1 tablet by mouth daily.     bisacodyl (DULCOLAX) 5 MG EC tablet Take 5 mg by mouth daily as needed for moderate constipation.     Cholecalciferol (VITAMIN D3) 50 MCG (2000 UT) capsule Take 2,000 Units by mouth daily.  clopidogrel (PLAVIX) 75 MG tablet Take 75 mg by mouth every evening.     gabapentin (NEURONTIN) 100 MG capsule Take 100 mg by mouth in the morning.     guaiFENesin (MUCINEX) 600 MG 12 hr tablet Take 600 mg by mouth 2 (two) times daily. For 2 weeks Started 08/19/2020 ends 09/05/2020     guaiFENesin (ROBITUSSIN) 100 MG/5ML liquid Take 200 mg by mouth every 4 (four) hours as needed for cough.     melatonin 3 MG TABS tablet Take 3 mg by mouth at bedtime.     polyethylene glycol (MIRALAX / GLYCOLAX) 17 g packet Take 17 g by mouth daily. 30 each 4   senna-docusate (SENOKOT-S) 8.6-50 MG tablet Take 2 tablets by mouth at bedtime. 60 tablet 3   No current facility-administered medications for this visit.    REVIEW OF SYSTEMS:  [X]  denotes positive finding, [ ]   denotes negative finding Cardiac  Comments:  Chest pain or chest pressure:    Shortness of breath upon exertion:    Short of breath when lying flat:    Irregular heart rhythm:        Vascular    Pain in calf, thigh, or hip brought on by ambulation:    Pain in feet at night that wakes you up from your sleep:     Blood clot in your veins:    Leg swelling:         Pulmonary    Oxygen at home:    Productive cough:     Wheezing:         Neurologic    Sudden weakness in arms or legs:     Sudden numbness in arms or legs:     Sudden onset of difficulty speaking or slurred speech:    Temporary loss of vision in one eye:     Problems with dizziness:         Gastrointestinal    Blood in stool:     Vomited blood:         Genitourinary    Burning when urinating:     Blood in urine:        Psychiatric    Major depression:         Hematologic    Bleeding problems:    Problems with blood clotting too easily:        Skin    Rashes or ulcers:        Constitutional    Fever or chills:      PHYSICAL EXAM: Vitals:   04/07/22 1338  BP: (!) 153/91  Pulse: 66  Temp: 98.4 F (36.9 C)  TempSrc: Temporal  SpO2: 93%  Weight: 156 lb (70.8 kg)  Height: 5\' 3"  (1.6 m)    GENERAL: The patient is a well-nourished female, in no acute distress. The vital signs are documented above. CARDIOVASCULAR: Do not palpate a right popliteal pulse. PULMONARY: There is good air exchange  MUSCULOSKELETAL: There are no major deformities or cyanosis.  Left below-knee amputation NEUROLOGIC: No focal weakness or paresthesias are detected. SKIN: There are no ulcers or rashes noted.  He does have thickening in the medial aspect of her right great toenail distally.  No surrounding erythema PSYCHIATRIC: The patient has a normal affect.  DATA:  Hand-held Doppler reveals biphasic flow and her dorsalis pedis on the right  MEDICAL ISSUES: I reviewed her prior arteriogram from 2019.  At that time she did  have patency of her femoral-popliteal vessels on  the right.  She had single-vessel runoff with moderately diseased anterior tibial artery on the right.  Her hand-held Doppler suggest no significant change since that arteriogram.  I do not feel this represents arterial rest pain.  I feel that she does have a specific issue regarding her probable ingrown toenail causing her discomfort.  I feel that it is safe to proceed with any treatment is indicated regarding this.  I would not recommend arteriography or further evaluation unless she has progressive tissue loss.  She was reassured with this discussion and will see [place] again on an as-needed basis   Larina Earthly, MD [facility] [phone] Pager [phone]  Note: Portions of this report may have been transcribed using voice recognition software.  Every effort has been made to ensure accuracy; however, inadvertent computerized transcription errors may still be present.

## 2022-04-09 DIAGNOSIS — D513 Other dietary vitamin B12 deficiency anemia: Secondary | ICD-10-CM | POA: Diagnosis not present

## 2022-04-09 DIAGNOSIS — I7389 Other specified peripheral vascular diseases: Secondary | ICD-10-CM | POA: Diagnosis not present

## 2022-04-20 DIAGNOSIS — D513 Other dietary vitamin B12 deficiency anemia: Secondary | ICD-10-CM | POA: Diagnosis not present

## 2022-04-20 DIAGNOSIS — M19011 Primary osteoarthritis, right shoulder: Secondary | ICD-10-CM | POA: Diagnosis not present

## 2022-04-20 DIAGNOSIS — I7389 Other specified peripheral vascular diseases: Secondary | ICD-10-CM | POA: Diagnosis not present

## 2022-04-20 DIAGNOSIS — I10 Essential (primary) hypertension: Secondary | ICD-10-CM | POA: Diagnosis not present

## 2022-04-20 DIAGNOSIS — E782 Mixed hyperlipidemia: Secondary | ICD-10-CM | POA: Diagnosis not present

## 2022-04-20 DIAGNOSIS — N1832 Chronic kidney disease, stage 3b: Secondary | ICD-10-CM | POA: Diagnosis not present

## 2022-04-20 DIAGNOSIS — E1142 Type 2 diabetes mellitus with diabetic polyneuropathy: Secondary | ICD-10-CM | POA: Diagnosis not present

## 2022-04-20 DIAGNOSIS — K5901 Slow transit constipation: Secondary | ICD-10-CM | POA: Diagnosis not present

## 2022-04-20 DIAGNOSIS — E559 Vitamin D deficiency, unspecified: Secondary | ICD-10-CM | POA: Diagnosis not present

## 2022-04-30 DIAGNOSIS — K5901 Slow transit constipation: Secondary | ICD-10-CM | POA: Diagnosis not present

## 2022-04-30 DIAGNOSIS — I7389 Other specified peripheral vascular diseases: Secondary | ICD-10-CM | POA: Diagnosis not present

## 2022-04-30 DIAGNOSIS — I69391 Dysphagia following cerebral infarction: Secondary | ICD-10-CM | POA: Diagnosis not present

## 2022-04-30 DIAGNOSIS — I10 Essential (primary) hypertension: Secondary | ICD-10-CM | POA: Diagnosis not present

## 2022-05-18 DIAGNOSIS — M19011 Primary osteoarthritis, right shoulder: Secondary | ICD-10-CM | POA: Diagnosis not present

## 2022-05-18 DIAGNOSIS — I10 Essential (primary) hypertension: Secondary | ICD-10-CM | POA: Diagnosis not present

## 2022-05-18 DIAGNOSIS — E782 Mixed hyperlipidemia: Secondary | ICD-10-CM | POA: Diagnosis not present

## 2022-05-18 DIAGNOSIS — E559 Vitamin D deficiency, unspecified: Secondary | ICD-10-CM | POA: Diagnosis not present

## 2022-05-18 DIAGNOSIS — E1142 Type 2 diabetes mellitus with diabetic polyneuropathy: Secondary | ICD-10-CM | POA: Diagnosis not present

## 2022-05-18 DIAGNOSIS — I7389 Other specified peripheral vascular diseases: Secondary | ICD-10-CM | POA: Diagnosis not present

## 2022-05-18 DIAGNOSIS — K5901 Slow transit constipation: Secondary | ICD-10-CM | POA: Diagnosis not present

## 2022-05-18 DIAGNOSIS — N1832 Chronic kidney disease, stage 3b: Secondary | ICD-10-CM | POA: Diagnosis not present

## 2022-05-18 DIAGNOSIS — D513 Other dietary vitamin B12 deficiency anemia: Secondary | ICD-10-CM | POA: Diagnosis not present

## 2022-05-25 ENCOUNTER — Encounter (HOSPITAL_COMMUNITY): Payer: Self-pay | Admitting: Nurse Practitioner

## 2022-05-25 DIAGNOSIS — N6311 Unspecified lump in the right breast, upper outer quadrant: Secondary | ICD-10-CM | POA: Diagnosis not present

## 2022-05-26 ENCOUNTER — Other Ambulatory Visit (HOSPITAL_COMMUNITY): Payer: Self-pay | Admitting: Nurse Practitioner

## 2022-05-26 DIAGNOSIS — N6310 Unspecified lump in the right breast, unspecified quadrant: Secondary | ICD-10-CM

## 2022-06-02 DIAGNOSIS — N6459 Other signs and symptoms in breast: Secondary | ICD-10-CM | POA: Diagnosis not present

## 2022-06-02 DIAGNOSIS — R590 Localized enlarged lymph nodes: Secondary | ICD-10-CM | POA: Diagnosis not present

## 2022-06-02 DIAGNOSIS — R928 Other abnormal and inconclusive findings on diagnostic imaging of breast: Secondary | ICD-10-CM | POA: Diagnosis not present

## 2022-06-02 DIAGNOSIS — N6312 Unspecified lump in the right breast, upper inner quadrant: Secondary | ICD-10-CM | POA: Diagnosis not present

## 2022-06-02 DIAGNOSIS — N6489 Other specified disorders of breast: Secondary | ICD-10-CM | POA: Diagnosis not present

## 2022-06-08 DIAGNOSIS — B351 Tinea unguium: Secondary | ICD-10-CM | POA: Diagnosis not present

## 2022-06-08 DIAGNOSIS — M79674 Pain in right toe(s): Secondary | ICD-10-CM | POA: Diagnosis not present

## 2022-06-15 ENCOUNTER — Encounter (HOSPITAL_COMMUNITY): Payer: Medicare Other

## 2022-06-15 ENCOUNTER — Ambulatory Visit (HOSPITAL_COMMUNITY): Payer: Medicare Other

## 2022-06-15 DIAGNOSIS — E782 Mixed hyperlipidemia: Secondary | ICD-10-CM | POA: Diagnosis not present

## 2022-06-15 DIAGNOSIS — N1832 Chronic kidney disease, stage 3b: Secondary | ICD-10-CM | POA: Diagnosis not present

## 2022-06-15 DIAGNOSIS — D513 Other dietary vitamin B12 deficiency anemia: Secondary | ICD-10-CM | POA: Diagnosis not present

## 2022-06-15 DIAGNOSIS — I10 Essential (primary) hypertension: Secondary | ICD-10-CM | POA: Diagnosis not present

## 2022-06-15 DIAGNOSIS — E559 Vitamin D deficiency, unspecified: Secondary | ICD-10-CM | POA: Diagnosis not present

## 2022-06-15 DIAGNOSIS — N6311 Unspecified lump in the right breast, upper outer quadrant: Secondary | ICD-10-CM | POA: Diagnosis not present

## 2022-06-15 DIAGNOSIS — K5901 Slow transit constipation: Secondary | ICD-10-CM | POA: Diagnosis not present

## 2022-06-15 DIAGNOSIS — E1142 Type 2 diabetes mellitus with diabetic polyneuropathy: Secondary | ICD-10-CM | POA: Diagnosis not present

## 2022-06-15 DIAGNOSIS — C50411 Malignant neoplasm of upper-outer quadrant of right female breast: Secondary | ICD-10-CM | POA: Diagnosis not present

## 2022-06-15 DIAGNOSIS — I7389 Other specified peripheral vascular diseases: Secondary | ICD-10-CM | POA: Diagnosis not present

## 2022-06-15 DIAGNOSIS — M19011 Primary osteoarthritis, right shoulder: Secondary | ICD-10-CM | POA: Diagnosis not present

## 2022-06-16 DIAGNOSIS — C50411 Malignant neoplasm of upper-outer quadrant of right female breast: Secondary | ICD-10-CM | POA: Diagnosis not present

## 2022-06-16 DIAGNOSIS — N6311 Unspecified lump in the right breast, upper outer quadrant: Secondary | ICD-10-CM | POA: Diagnosis not present

## 2022-06-29 DIAGNOSIS — Z170 Estrogen receptor positive status [ER+]: Secondary | ICD-10-CM | POA: Diagnosis not present

## 2022-06-29 DIAGNOSIS — Z01810 Encounter for preprocedural cardiovascular examination: Secondary | ICD-10-CM | POA: Diagnosis not present

## 2022-06-29 DIAGNOSIS — C50411 Malignant neoplasm of upper-outer quadrant of right female breast: Secondary | ICD-10-CM | POA: Diagnosis not present

## 2022-07-09 DIAGNOSIS — R0989 Other specified symptoms and signs involving the circulatory and respiratory systems: Secondary | ICD-10-CM | POA: Diagnosis not present

## 2022-07-09 DIAGNOSIS — R062 Wheezing: Secondary | ICD-10-CM | POA: Diagnosis not present

## 2022-07-13 DIAGNOSIS — M19011 Primary osteoarthritis, right shoulder: Secondary | ICD-10-CM | POA: Diagnosis not present

## 2022-07-13 DIAGNOSIS — N6311 Unspecified lump in the right breast, upper outer quadrant: Secondary | ICD-10-CM | POA: Diagnosis not present

## 2022-07-13 DIAGNOSIS — I7389 Other specified peripheral vascular diseases: Secondary | ICD-10-CM | POA: Diagnosis not present

## 2022-07-13 DIAGNOSIS — N1832 Chronic kidney disease, stage 3b: Secondary | ICD-10-CM | POA: Diagnosis not present

## 2022-07-13 DIAGNOSIS — E782 Mixed hyperlipidemia: Secondary | ICD-10-CM | POA: Diagnosis not present

## 2022-07-13 DIAGNOSIS — I10 Essential (primary) hypertension: Secondary | ICD-10-CM | POA: Diagnosis not present

## 2022-07-13 DIAGNOSIS — C50411 Malignant neoplasm of upper-outer quadrant of right female breast: Secondary | ICD-10-CM | POA: Diagnosis not present

## 2022-07-13 DIAGNOSIS — D513 Other dietary vitamin B12 deficiency anemia: Secondary | ICD-10-CM | POA: Diagnosis not present

## 2022-07-13 DIAGNOSIS — K5901 Slow transit constipation: Secondary | ICD-10-CM | POA: Diagnosis not present

## 2022-07-13 DIAGNOSIS — E1142 Type 2 diabetes mellitus with diabetic polyneuropathy: Secondary | ICD-10-CM | POA: Diagnosis not present

## 2022-07-13 DIAGNOSIS — E559 Vitamin D deficiency, unspecified: Secondary | ICD-10-CM | POA: Diagnosis not present

## 2022-07-14 DIAGNOSIS — I071 Rheumatic tricuspid insufficiency: Secondary | ICD-10-CM | POA: Diagnosis not present

## 2022-07-14 DIAGNOSIS — I350 Nonrheumatic aortic (valve) stenosis: Secondary | ICD-10-CM | POA: Diagnosis not present

## 2022-07-14 DIAGNOSIS — I3481 Nonrheumatic mitral (valve) annulus calcification: Secondary | ICD-10-CM | POA: Diagnosis not present

## 2022-07-14 DIAGNOSIS — I272 Pulmonary hypertension, unspecified: Secondary | ICD-10-CM | POA: Diagnosis not present

## 2022-07-14 DIAGNOSIS — Z01810 Encounter for preprocedural cardiovascular examination: Secondary | ICD-10-CM | POA: Diagnosis not present

## 2022-07-21 DIAGNOSIS — C50411 Malignant neoplasm of upper-outer quadrant of right female breast: Secondary | ICD-10-CM | POA: Diagnosis not present

## 2022-07-21 DIAGNOSIS — Z170 Estrogen receptor positive status [ER+]: Secondary | ICD-10-CM | POA: Diagnosis not present

## 2022-07-23 DIAGNOSIS — Z8673 Personal history of transient ischemic attack (TIA), and cerebral infarction without residual deficits: Secondary | ICD-10-CM | POA: Diagnosis not present

## 2022-07-23 DIAGNOSIS — I10 Essential (primary) hypertension: Secondary | ICD-10-CM | POA: Diagnosis not present

## 2022-07-23 DIAGNOSIS — N1832 Chronic kidney disease, stage 3b: Secondary | ICD-10-CM | POA: Diagnosis not present

## 2022-07-23 DIAGNOSIS — E559 Vitamin D deficiency, unspecified: Secondary | ICD-10-CM | POA: Diagnosis not present

## 2022-07-23 DIAGNOSIS — E119 Type 2 diabetes mellitus without complications: Secondary | ICD-10-CM | POA: Diagnosis not present

## 2022-07-23 DIAGNOSIS — D519 Vitamin B12 deficiency anemia, unspecified: Secondary | ICD-10-CM | POA: Diagnosis not present

## 2022-07-23 DIAGNOSIS — Z79899 Other long term (current) drug therapy: Secondary | ICD-10-CM | POA: Diagnosis not present

## 2022-07-23 DIAGNOSIS — M6281 Muscle weakness (generalized): Secondary | ICD-10-CM | POA: Diagnosis not present

## 2022-07-30 DIAGNOSIS — Z1159 Encounter for screening for other viral diseases: Secondary | ICD-10-CM | POA: Diagnosis not present

## 2022-07-30 DIAGNOSIS — Z170 Estrogen receptor positive status [ER+]: Secondary | ICD-10-CM | POA: Diagnosis not present

## 2022-07-30 DIAGNOSIS — C50411 Malignant neoplasm of upper-outer quadrant of right female breast: Secondary | ICD-10-CM | POA: Diagnosis not present

## 2022-08-04 DIAGNOSIS — Z5112 Encounter for antineoplastic immunotherapy: Secondary | ICD-10-CM | POA: Diagnosis not present

## 2022-08-04 DIAGNOSIS — Z170 Estrogen receptor positive status [ER+]: Secondary | ICD-10-CM | POA: Diagnosis not present

## 2022-08-04 DIAGNOSIS — C50411 Malignant neoplasm of upper-outer quadrant of right female breast: Secondary | ICD-10-CM | POA: Diagnosis not present

## 2022-08-09 DIAGNOSIS — I119 Hypertensive heart disease without heart failure: Secondary | ICD-10-CM | POA: Diagnosis not present

## 2022-08-09 DIAGNOSIS — G629 Polyneuropathy, unspecified: Secondary | ICD-10-CM | POA: Diagnosis not present

## 2022-08-09 DIAGNOSIS — E1151 Type 2 diabetes mellitus with diabetic peripheral angiopathy without gangrene: Secondary | ICD-10-CM | POA: Diagnosis not present

## 2022-08-09 DIAGNOSIS — C50911 Malignant neoplasm of unspecified site of right female breast: Secondary | ICD-10-CM | POA: Diagnosis not present

## 2022-08-09 DIAGNOSIS — I739 Peripheral vascular disease, unspecified: Secondary | ICD-10-CM | POA: Diagnosis not present

## 2022-08-17 DIAGNOSIS — C50911 Malignant neoplasm of unspecified site of right female breast: Secondary | ICD-10-CM | POA: Diagnosis not present

## 2022-08-17 DIAGNOSIS — I482 Chronic atrial fibrillation, unspecified: Secondary | ICD-10-CM | POA: Diagnosis not present

## 2022-08-20 ENCOUNTER — Encounter (HOSPITAL_COMMUNITY): Payer: Self-pay

## 2022-08-20 ENCOUNTER — Other Ambulatory Visit: Payer: Self-pay

## 2022-08-20 ENCOUNTER — Emergency Department (HOSPITAL_COMMUNITY): Payer: Medicare Other

## 2022-08-20 ENCOUNTER — Emergency Department (HOSPITAL_COMMUNITY)
Admission: EM | Admit: 2022-08-20 | Discharge: 2022-08-20 | Disposition: A | Discharge status: Skilled Nursing Facility | Payer: Medicare Other | Attending: Emergency Medicine | Admitting: Emergency Medicine

## 2022-08-20 DIAGNOSIS — R109 Unspecified abdominal pain: Secondary | ICD-10-CM | POA: Diagnosis not present

## 2022-08-20 DIAGNOSIS — Z7901 Long term (current) use of anticoagulants: Secondary | ICD-10-CM | POA: Diagnosis not present

## 2022-08-20 DIAGNOSIS — Z743 Need for continuous supervision: Secondary | ICD-10-CM | POA: Diagnosis not present

## 2022-08-20 DIAGNOSIS — Z853 Personal history of malignant neoplasm of breast: Secondary | ICD-10-CM | POA: Insufficient documentation

## 2022-08-20 DIAGNOSIS — R6889 Other general symptoms and signs: Secondary | ICD-10-CM | POA: Diagnosis not present

## 2022-08-20 DIAGNOSIS — K5730 Diverticulosis of large intestine without perforation or abscess without bleeding: Secondary | ICD-10-CM | POA: Diagnosis not present

## 2022-08-20 DIAGNOSIS — N200 Calculus of kidney: Secondary | ICD-10-CM | POA: Diagnosis not present

## 2022-08-20 DIAGNOSIS — K625 Hemorrhage of anus and rectum: Secondary | ICD-10-CM | POA: Insufficient documentation

## 2022-08-20 DIAGNOSIS — K8020 Calculus of gallbladder without cholecystitis without obstruction: Secondary | ICD-10-CM | POA: Diagnosis not present

## 2022-08-20 DIAGNOSIS — I10 Essential (primary) hypertension: Secondary | ICD-10-CM | POA: Diagnosis not present

## 2022-08-20 LAB — CBC WITH DIFFERENTIAL/PLATELET
Abs Immature Granulocytes: 0.02 10*3/uL (ref 0.00–0.07)
Basophils Absolute: 0 10*3/uL (ref 0.0–0.1)
Basophils Relative: 0 %
Eosinophils Absolute: 0.2 10*3/uL (ref 0.0–0.5)
Eosinophils Relative: 2 %
HCT: 43.9 % (ref 36.0–46.0)
Hemoglobin: 14.5 g/dL (ref 12.0–15.0)
Immature Granulocytes: 0 %
Lymphocytes Relative: 26 %
Lymphs Abs: 1.6 10*3/uL (ref 0.7–4.0)
MCH: 30 pg (ref 26.0–34.0)
MCHC: 33 g/dL (ref 30.0–36.0)
MCV: 90.7 fL (ref 80.0–100.0)
Monocytes Absolute: 0.8 10*3/uL (ref 0.1–1.0)
Monocytes Relative: 12 %
Neutro Abs: 3.7 10*3/uL (ref 1.7–7.7)
Neutrophils Relative %: 60 %
Platelets: 231 10*3/uL (ref 150–400)
RBC: 4.84 MIL/uL (ref 3.87–5.11)
RDW: 13.7 % (ref 11.5–15.5)
WBC: 6.2 10*3/uL (ref 4.0–10.5)
nRBC: 0 % (ref 0.0–0.2)

## 2022-08-20 LAB — I-STAT CHEM 8, ED
BUN: 20 mg/dL (ref 8–23)
Calcium, Ion: 1.19 mmol/L (ref 1.15–1.40)
Chloride: 100 mmol/L (ref 98–111)
Creatinine, Ser: 1.4 mg/dL — ABNORMAL HIGH (ref 0.44–1.00)
Glucose, Bld: 199 mg/dL — ABNORMAL HIGH (ref 70–99)
HCT: 42 % (ref 36.0–46.0)
Hemoglobin: 14.3 g/dL (ref 12.0–15.0)
Potassium: 3.8 mmol/L (ref 3.5–5.1)
Sodium: 140 mmol/L (ref 135–145)
TCO2: 32 mmol/L (ref 22–32)

## 2022-08-20 LAB — TYPE AND SCREEN
ABO/RH(D): O POS
Antibody Screen: NEGATIVE

## 2022-08-20 LAB — COMPREHENSIVE METABOLIC PANEL
ALT: 13 U/L (ref 0–44)
AST: 18 U/L (ref 15–41)
Albumin: 3.5 g/dL (ref 3.5–5.0)
Alkaline Phosphatase: 76 U/L (ref 38–126)
Anion gap: 11 (ref 5–15)
BUN: 19 mg/dL (ref 8–23)
CO2: 29 mmol/L (ref 22–32)
Calcium: 9.3 mg/dL (ref 8.9–10.3)
Chloride: 99 mmol/L (ref 98–111)
Creatinine, Ser: 1.29 mg/dL — ABNORMAL HIGH (ref 0.44–1.00)
GFR, Estimated: 39 mL/min — ABNORMAL LOW (ref >60)
Glucose, Bld: 200 mg/dL — ABNORMAL HIGH (ref 70–99)
Potassium: 3.7 mmol/L (ref 3.5–5.1)
Sodium: 139 mmol/L (ref 135–145)
Total Bilirubin: 0.4 mg/dL (ref 0.3–1.2)
Total Protein: 6.1 g/dL — ABNORMAL LOW (ref 6.5–8.1)

## 2022-08-20 LAB — POC OCCULT BLOOD, ED: Fecal Occult Bld: POSITIVE — AB

## 2022-08-20 MED ORDER — PANTOPRAZOLE SODIUM 40 MG IV SOLR
40.0000 mg | Freq: Once | INTRAVENOUS | Status: AC
  Administered 2022-08-20: 40 mg via INTRAVENOUS
  Filled 2022-08-20: qty 10

## 2022-08-20 MED ORDER — IOHEXOL 300 MG/ML  SOLN
80.0000 mL | Freq: Once | INTRAMUSCULAR | Status: AC | PRN
  Administered 2022-08-20: 80 mL via INTRAVENOUS

## 2022-08-20 MED ORDER — SODIUM CHLORIDE 0.9 % IV BOLUS
1000.0000 mL | Freq: Once | INTRAVENOUS | Status: AC
  Administered 2022-08-20: 1000 mL via INTRAVENOUS

## 2022-08-20 NOTE — ED Provider Notes (Signed)
[place] EMERGENCY DEPARTMENT AT [facility] Provider Note   CSN: [id number] Arrival date & time: 08/20/22  1512     History {Add pertinent medical, surgical, social history, OB history to HPI:1} Chief Complaint  Patient presents with   Rectal Bleeding    [name] Petersen is a 89 y.o. female.  Patient has a history of breast cancer.  She was brought to the emergency department for rectal bleeding which she has had for weeks.  No other symptoms   Rectal Bleeding      Home Medications Prior to Admission medications   Medication Sig Start Date End Date Taking? Authorizing Provider  acetaminophen (TYLENOL) 325 MG tablet Take 1-2 tablets (325-650 mg total) by mouth every 6 (six) hours as needed for mild pain. 07/29/17   Love, Evlyn Kanner, PA-C  amLODipine (NORVASC) 10 MG tablet Take 1 tablet (10 mg total) by mouth daily. Patient taking differently: Take 5 mg by mouth daily. 07/29/17   Love, Evlyn Kanner, PA-C  beta carotene w/minerals (OCUVITE) tablet Take 1 tablet by mouth daily.    [provider]  bisacodyl (DULCOLAX) 5 MG EC tablet Take 5 mg by mouth daily as needed for moderate constipation.    [provider]  Cholecalciferol (VITAMIN D3) 50 MCG (2000 UT) capsule Take 2,000 Units by mouth daily.    [provider]  clopidogrel (PLAVIX) 75 MG tablet Take 75 mg by mouth every evening. 04/03/14   [provider]  gabapentin (NEURONTIN) 100 MG capsule Take 100 mg by mouth in the morning.    [provider]  guaiFENesin (MUCINEX) 600 MG 12 hr tablet Take 600 mg by mouth 2 (two) times daily. For 2 weeks Started 08/19/2020 ends 09/05/2020    [provider]  guaiFENesin (ROBITUSSIN) 100 MG/5ML liquid Take 200 mg by mouth every 4 (four) hours as needed for cough.    [provider]  melatonin 3 MG TABS tablet Take 3 mg by mouth at bedtime.    [provider]  polyethylene glycol (MIRALAX / GLYCOLAX) 17 g packet Take  17 g by mouth daily. 08/28/20   Shon Hale, MD  senna-docusate (SENOKOT-S) 8.6-50 MG tablet Take 2 tablets by mouth at bedtime. 08/28/20   Shon Hale, MD      Allergies    Scopace [scopolamine]    Review of Systems   Review of Systems  Gastrointestinal:  Positive for hematochezia.    Physical Exam Updated Vital Signs BP (!) 188/88   Pulse 74   Temp 98 F (36.7 C) (Oral)   Resp 17   SpO2 97%  Physical Exam  ED Results / Procedures / Treatments   Labs (all labs ordered are listed, but only abnormal results are displayed) Labs Reviewed  COMPREHENSIVE METABOLIC PANEL - Abnormal; Notable for the following components:      Result Value   Glucose, Bld 200 (*)    Creatinine, Ser 1.29 (*)    Total Protein 6.1 (*)    GFR, Estimated 39 (*)    All other components within normal limits  I-STAT CHEM 8, ED - Abnormal; Notable for the following components:   Creatinine, Ser 1.40 (*)    Glucose, Bld 199 (*)    All other components within normal limits  POC OCCULT BLOOD, ED - Abnormal; Notable for the following components:   Fecal Occult Bld POSITIVE (*)    All other components within normal limits  CBC WITH DIFFERENTIAL/PLATELET  TYPE AND SCREEN  EKG None  Radiology CT ABDOMEN PELVIS W CONTRAST  Result Date: 08/20/2022 CLINICAL DATA:  Right red blood per rectum for several weeks, abdominal pain EXAM: CT ABDOMEN AND PELVIS WITH CONTRAST TECHNIQUE: Multidetector CT imaging of the abdomen and pelvis was performed using the standard protocol following bolus administration of intravenous contrast. RADIATION DOSE REDUCTION: This exam was performed according to the departmental dose-optimization program which includes automated exposure control, adjustment of the mA and/or kV according to patient size and/or use of iterative reconstruction technique. CONTRAST:  80mL OMNIPAQUE IOHEXOL 300 MG/ML  SOLN COMPARISON:  08/23/2020. FINDINGS: Lower chest: No acute pleural or parenchymal  lung disease. Calcification of the mitral and aortic valves. Small hiatal hernia. Hepatobiliary: Multiple gallstones are identified without evidence of acute cholecystitis. There are numerous hepatic simple cysts. No biliary duct dilation or choledocholithiasis. Pancreas: There is a 1.5 x 2.4 x 1.8 cm simple appearing cyst off the inferior margin of the pancreatic body, reference image 33/2. No evidence of mural thickening or nodularity. Thin calcifications are seen within the wall the cyst on the coronal reconstructed images. No acute inflammatory changes within the pancreas. No pancreatic duct dilation. Spleen: Normal in size without focal abnormality. Adrenals/Urinary Tract: 1.4 cm left adrenal nodule is unchanged, with previous imaging characteristics consistent with adenoma. No specific follow-up is required given stability since 08/23/2020 exam. Right adrenal is unremarkable. There are numerous simple bilateral renal cysts which do not require specific imaging follow-up. 4 mm nonobstructing calculus lower pole right kidney. No left-sided calculi. No obstructive uropathy. The bladder is unremarkable. Stomach/Bowel: No bowel obstruction or ileus. Marked diverticulosis of the descending and sigmoid colon without evidence of acute diverticulitis. Normal appendix right lower quadrant. No bowel wall thickening or inflammatory changes. Evidence of pelvic floor laxity and rectal prolapse. Vascular/Lymphatic: Aortic atherosclerosis. No enlarged abdominal or pelvic lymph nodes. Reproductive: Status post hysterectomy. No adnexal masses. Other: No free fluid or free intraperitoneal gas. No abdominal wall hernia. Musculoskeletal: No acute or destructive bony lesions. Severe bilateral hip osteoarthritis. Reconstructed images demonstrate no additional findings. IMPRESSION: 1. Marked diverticulosis of the descending and sigmoid colon without evidence of acute diverticulitis. 2. Pelvic floor laxity with evidence of rectal  prolapse. 3. Nonobstructing 4 mm right renal calculus. 4. Cholelithiasis without cholecystitis. 5. Small hiatal hernia. 6. Simple appearing pancreatic cyst as above. No specific follow-up is recommended given patient age and relatively benign imaging characteristics. If further evaluation is clinically indicated, follow-up dedicated pancreatic MRI in 1 year could be considered. 7. Numerous benign hepatic and renal cysts, which do not require specific imaging follow-up. 8.  Aortic Atherosclerosis (ICD10-I70.0). Electronically Signed   By: Sharlet Salina M.D.   On: 08/20/2022 18:10    Procedures Procedures  {Document cardiac monitor, telemetry assessment procedure when appropriate:1}  Medications Ordered in ED Medications  sodium chloride 0.9 % bolus 1,000 mL (1,000 mLs Intravenous Bolus from Bag 08/20/22 1634)  pantoprazole (PROTONIX) injection 40 mg (40 mg Intravenous Given 08/20/22 1633)  iohexol (OMNIPAQUE) 300 MG/ML solution 80 mL (80 mLs Intravenous Contrast Given 08/20/22 1744)    ED Course/ Medical Decision Making/ A&P   {I spoke with the gastroenterologist Dr. Levon Hedger and he felt like the patient could be discharged home with outpatient follow-up Click here for ABCD2, HEART and other calculatorsREFRESH Note before signing :1}                          Medical Decision Making Amount and/or Complexity of  Data Reviewed Labs: ordered. Radiology: ordered.  Risk Prescription drug management.   Patient with rectal bleeding but stable vitals and normal hemoglobin.  She is discharged home to follow-up with GI  {Document critical care time when appropriate:1} {Document review of labs and clinical decision tools ie heart score, Chads2Vasc2 etc:1}  {Document your independent review of radiology images, and any outside records:1} {Document your discussion with family members, caretakers, and with consultants:1} {Document social determinants of health affecting pt's care:1} {Document your  decision making why or why not admission, treatments were needed:1} Final Clinical Impression(s) / ED Diagnoses Final diagnoses:  Rectal bleeding    Rx / DC Orders ED Discharge Orders     None

## 2022-08-20 NOTE — ED Notes (Signed)
Patient transported to CT 

## 2022-08-20 NOTE — ED Notes (Signed)
Update given to [name] Petersen face to face per daughter request.

## 2022-08-20 NOTE — ED Triage Notes (Signed)
Pt BIB [facility] from [facility], c/o bright red blood in toilet after BM. Pt denies constipation, no hx of hemorrhoids. Has been taking stool softener twice daily.   Pt endorses blood in stool for weeks.  Pt is below the knee amputee on L leg.   Pt is A & O x 4, NAD in triage.

## 2022-08-20 NOTE — Discharge Instructions (Signed)
Follow-up with Dr. Ernest Mallick or one of his colleagues next week.  Return sooner if bleeding gets significantly worse

## 2022-08-23 DIAGNOSIS — K625 Hemorrhage of anus and rectum: Secondary | ICD-10-CM | POA: Diagnosis not present

## 2022-08-23 DIAGNOSIS — G546 Phantom limb syndrome with pain: Secondary | ICD-10-CM | POA: Diagnosis not present

## 2022-08-23 DIAGNOSIS — C50411 Malignant neoplasm of upper-outer quadrant of right female breast: Secondary | ICD-10-CM | POA: Diagnosis not present

## 2022-08-23 DIAGNOSIS — Z170 Estrogen receptor positive status [ER+]: Secondary | ICD-10-CM | POA: Diagnosis not present

## 2022-08-23 DIAGNOSIS — Z01810 Encounter for preprocedural cardiovascular examination: Secondary | ICD-10-CM | POA: Diagnosis not present

## 2022-08-24 DIAGNOSIS — B351 Tinea unguium: Secondary | ICD-10-CM | POA: Diagnosis not present

## 2022-08-24 DIAGNOSIS — M79674 Pain in right toe(s): Secondary | ICD-10-CM | POA: Diagnosis not present

## 2022-08-25 DIAGNOSIS — Z5112 Encounter for antineoplastic immunotherapy: Secondary | ICD-10-CM | POA: Diagnosis not present

## 2022-08-25 DIAGNOSIS — C50411 Malignant neoplasm of upper-outer quadrant of right female breast: Secondary | ICD-10-CM | POA: Diagnosis not present

## 2022-08-25 DIAGNOSIS — Z170 Estrogen receptor positive status [ER+]: Secondary | ICD-10-CM | POA: Diagnosis not present

## 2022-08-26 DIAGNOSIS — Z961 Presence of intraocular lens: Secondary | ICD-10-CM | POA: Diagnosis not present

## 2022-08-26 DIAGNOSIS — H31012 Macula scars of posterior pole (postinflammatory) (post-traumatic), left eye: Secondary | ICD-10-CM | POA: Diagnosis not present

## 2022-08-26 DIAGNOSIS — H353231 Exudative age-related macular degeneration, bilateral, with active choroidal neovascularization: Secondary | ICD-10-CM | POA: Diagnosis not present

## 2022-09-06 DIAGNOSIS — E559 Vitamin D deficiency, unspecified: Secondary | ICD-10-CM | POA: Diagnosis not present

## 2022-09-06 DIAGNOSIS — E782 Mixed hyperlipidemia: Secondary | ICD-10-CM | POA: Diagnosis not present

## 2022-09-06 DIAGNOSIS — K922 Gastrointestinal hemorrhage, unspecified: Secondary | ICD-10-CM | POA: Diagnosis not present

## 2022-09-06 DIAGNOSIS — I119 Hypertensive heart disease without heart failure: Secondary | ICD-10-CM | POA: Diagnosis not present

## 2022-09-07 ENCOUNTER — Ambulatory Visit (INDEPENDENT_AMBULATORY_CARE_PROVIDER_SITE_OTHER): Payer: Medicare Other | Admitting: Gastroenterology

## 2022-09-07 ENCOUNTER — Encounter: Payer: Self-pay | Admitting: Gastroenterology

## 2022-09-07 VITALS — BP 141/82 | HR 74 | Temp 97.3°F | Ht 63.0 in

## 2022-09-07 DIAGNOSIS — L308 Other specified dermatitis: Secondary | ICD-10-CM

## 2022-09-07 DIAGNOSIS — K625 Hemorrhage of anus and rectum: Secondary | ICD-10-CM | POA: Diagnosis not present

## 2022-09-07 DIAGNOSIS — B372 Candidiasis of skin and nail: Secondary | ICD-10-CM | POA: Diagnosis not present

## 2022-09-07 DIAGNOSIS — K623 Rectal prolapse: Secondary | ICD-10-CM

## 2022-09-07 MED ORDER — HYDROCORTISONE 1 % EX CREA: 1.0000 | TOPICAL_CREAM | Freq: Two times a day (BID) | CUTANEOUS | 0 refills | Status: DC

## 2022-09-07 MED ORDER — NYSTATIN 100000 UNIT/GM EX OINT: 1.0000 | TOPICAL_OINTMENT | Freq: Two times a day (BID) | CUTANEOUS | 0 refills | Status: DC

## 2022-09-07 MED ORDER — PHENYLEPH-SHARK LIV OIL-MO-PET 0.25-3-14-71.9 % RE OINT: 1.0000 | TOPICAL_OINTMENT | Freq: Two times a day (BID) | RECTAL | 0 refills | Status: DC

## 2022-09-07 NOTE — Progress Notes (Addendum)
GI Office Note    Referring Provider: No ref. provider found Primary Care Physician:  Pcp, No  Primary Gastroenterologist: Dolores Frame, MD  Chief Complaint   Chief Complaint  Patient presents with   Rectal Bleeding    Patient here today due to seeing bright red blood in stools. Patient says she seen the blood a week ago. Patient denies any chest pain, shortness of breath, or dizziness. She does have issues with fatigue. Aug 23, 2011 hgb was 13.2.Patient had a Ed visit on 08/20/2022 due to rectal bleeding.    History of Present Illness   [name] Petersen is a 89 y.o. female presenting today at the request of No ref. provider found for rectal bleeding.  Patient seen in the ED 08/20/2022 for reported a few weeks of rectal bleeding.  Bleeding described as maroon and occurs intermittently with the scant amount of bleeding.  Denied any association of abdominal pain.  Labs -glucose 200, creatinine 1.29, GFR 39, fecal occult positive, hemoglobin 14.5.  No intervention is performed.  Discharged home and advised to follow-up with GI.  CT A/P 08/20/2022: -Marked diverticulosis of descending and sigmoid colon without evidence of diverticulitis -Pelvic floor laxity with evidence of rectal prolapse -Nonobstructing 4 mm right renal calculus -Cholelithiasis without cholecystitis -Small hiatal hernia -Simple appearing pancreatic cyst measuring 1.5 x 2.4 x 1.8 cm (could consider follow-up MRI in 1 year) -Numerous benign hepatic and renal cysts with no need for follow-up.  Daughters Gunnar Fusi and Clydie Braun accompany her today and helped to provide some history.  Has been having rectal bleeding on and off since the end of April. Is always bright red. Denies history of hemorrhoids. Has a little pain and itching in the rectal area. Has had rectal prolapse. States she is confused all the time due to MD. No chest pain, shortness of breath or frequent falls. Appetite is loess than it used to. Not losing  weight.. Does have pain in the rectal area. Had one day of shooting pain in her abdomen but was short lasting.   Constipation her whole life but since taking the immunotherapy she has had looser stools and does not always know when she has to go. Has pastey loose stools with urination and usually is a small amount.  On plavix for history of stroke.    Current Outpatient Medications  Medication Sig Dispense Refill   beta carotene w/minerals (OCUVITE) tablet Take 1 tablet by mouth daily.     calcium carbonate (TUMS - DOSED IN MG ELEMENTAL CALCIUM) 500 MG chewable tablet Chew 2 tablets by mouth as needed for indigestion or heartburn.     Cholecalciferol (VITAMIN D3) 50 MCG (2000 UT) capsule Take 2,000 Units by mouth daily.     cyanocobalamin 1000 MCG tablet Take 1,000 mcg by mouth daily.     furosemide (LASIX) 20 MG tablet Take 20 mg by mouth daily. Edema     letrozole (FEMARA) 2.5 MG tablet Take 2.5 mg by mouth daily.     Menthol, Topical Analgesic, (BIOFREEZE) 4 % GEL Apply topically as needed.     Multiple Vitamins-Minerals (MULTIVITAMIN WITH MINERALS) tablet Take 1 tablet by mouth daily.     nystatin ointment (MYCOSTATIN) Apply 1 Application topically 2 (two) times daily. 30 g 0   phenylephrine-shark liver oil-mineral oil-petrolatum (PREPARATION H) 0.25-3-14-71.9 % rectal ointment Place 1 Application rectally 2 (two) times daily. Apply internally for 10 days and then as needed. 30 g 0   acetaminophen (TYLENOL) 325 MG  tablet Take 3 tablets (975 mg total) by mouth at bedtime as needed for mild pain.     amLODipine (NORVASC) 5 MG tablet Take 5 mg by mouth daily.     apixaban (ELIQUIS) 5 MG TABS tablet Take 1 tablet (5 mg total) by mouth 2 (two) times daily. 60 tablet 2   gabapentin (NEURONTIN) 100 MG capsule Take 100 mg by mouth daily.     hydrocortisone (ANUSOL-HC) 2.5 % rectal cream Place 1 Application rectally 2 (two) times daily.     lovastatin (MEVACOR) 20 MG tablet Take 20 mg by mouth daily  at 6 PM.     metoprolol tartrate (LOPRESSOR) 25 MG tablet Take 0.5 tablets (12.5 mg total) by mouth 2 (two) times daily. 15 tablet 2   trastuzumab 4 mg/kg in sodium chloride 0.9 % 250 mL Inject 4 mg/kg into the vein once.     No current facility-administered medications for this visit.    Past Medical History:  Diagnosis Date   Arthritis    hands   Atherosclerotic PVD with ulceration (HCC) 07/15/2017   RIGHT HEEL   Breast cancer (HCC)    Cancer (HCC)    Complication of anesthesia    lost eyesight for a few days after surgery in the 1950's   Constipation    Diabetes mellitus without complication (HCC)    no medications    Hypertension    Macular degeneration, bilateral    "one eye wet; one eye dry; she gets shots" (07/07/2017)   PAD (peripheral artery disease) (HCC)    Pneumonia    Stroke (HCC)    some trouble swallowing    Past Surgical History:  Procedure Laterality Date   ABDOMINAL AORTOGRAM W/LOWER EXTREMITY N/A 06/16/2017   Procedure: ABDOMINAL AORTOGRAM W/LOWER EXTREMITY;  Surgeon: Fransisco Hertz, MD;  Location: [facility] INVASIVE CV LAB;  Service: Cardiovascular;  Laterality: N/A;  lt extermity   ABDOMINAL HYSTERECTOMY     AMPUTATION Left 07/06/2017   Procedure: AMPUTATION BELOW KNEE LEFT;  Surgeon: Larina Earthly, MD;  Location: [facility] OR;  Service: Vascular;  Laterality: Left;   CATARACT EXTRACTION W/ INTRAOCULAR LENS  IMPLANT, BILATERAL     DILATION AND CURETTAGE OF UTERUS     LOWER EXTREMITY ANGIOGRAPHY N/A 07/14/2017   Procedure: LOWER EXTREMITY ANGIOGRAPHY;  Surgeon: Chuck Hint, MD;  Location: [facility] INVASIVE CV LAB;  Service: Cardiovascular;  Laterality: N/A;    Family History  Adopted: Yes    Allergies as of 09/07/2022 - Review Complete 09/07/2022  Allergen Reaction Noted   Scopace [scopolamine] Other (See Comments) 04/27/2014    Social History   Socioeconomic History   Marital status: Widowed    Spouse name: Not on file   Number of children: Not on file    Years of education: Not on file   Highest education level: Not on file  Occupational History   Not on file  Tobacco Use   Smoking status: Never   Smokeless tobacco: Never  Vaping Use   Vaping Use: Never used  Substance and Sexual Activity   Alcohol use: Never   Drug use: Never   Sexual activity: Not Currently    Comment: intercourse age 10,sexual patrners less than 5   Other Topics Concern   Not on file  Social History Narrative   Not on file   Social Determinants of Health   Financial Resource Strain: Not on file  Food Insecurity: No Food Insecurity (09/11/2022)   Hunger Vital Sign  Worried About Programme researcher, broadcasting/film/video in the Last Year: Never true    Ran Out of Food in the Last Year: Never true  Transportation Needs: No Transportation Needs (09/11/2022)   PRAPARE - Administrator, Civil Service (Medical): No    Lack of Transportation (Non-Medical): No  Physical Activity: Not on file  Stress: Not on file  Social Connections: Not on file  Intimate Partner Violence: Patient Declined (09/11/2022)   Humiliation, Afraid, Rape, and Kick questionnaire    Fear of Current or Ex-Partner: Patient declined    Emotionally Abused: Patient declined    Physically Abused: Patient declined    Sexually Abused: Patient declined     Review of Systems   Gen: Denies any fever, chills, fatigue, weight loss, lack of appetite.  CV: Denies chest pain, heart palpitations, peripheral edema, syncope.  Resp: Denies shortness of breath at rest or with exertion. Denies wheezing or cough.  GI: see HPI GU : Denies urinary burning, urinary frequency, urinary hesitancy MS: Denies joint pain, muscle weakness, cramps, or limitation of movement.  Derm: + rash. Denies itching, dry skin Psych: Denies depression, anxiety, memory loss, and confusion Heme: Denies bruising, bleeding, and enlarged lymph nodes.   Physical Exam   BP (!) 141/82 (BP Location: Left Arm, Patient Position: Sitting, Cuff  Size: Large)   Pulse 74   Temp (!) 97.3 F (36.3 C) (Temporal)   Ht 5\' 3"  (1.6 m)   BMI 27.63 kg/m   General:   Alert and oriented. Pleasant and cooperative. Well-nourished and well-developed.  Head:  Normocephalic and atraumatic. Eyes:  Without icterus, sclera clear and conjunctiva pink.  Ears:  Normal auditory acuity. Mouth:  No deformity or lesions, oral mucosa pink.  Rectal:mild rectal prolapse noted. Mild mucosal prolapse. Decreased rectal tone. No overt blood present. Mild stool in rectal vault very soft. Possible mild hemorrhoids present. No rectal mass identified.  Msk: left BKA, unsteady gait, 2 assist transfer to wheelchair Extremities:  left BKA Neurologic:  Alert and  oriented x4;  grossly normal neurologically. Skin:  significant erythema to bilateral groin (MASD). Psych:  Alert and cooperative. Normal mood and affect.   Assessment   SHERY WAUNEKA is a 89 y.o. female with a history of arthritis, atherosclerotic PVD, constipation, diabetes, HTN, PAD, stroke, macular degeneration, and breast cancer of the right breast undergoing chemotherapy (followed by [facility] oncology) presenting today for evaluation of rectal bleeding.  Rectal bleeding: History of rectal prolapse. Soft stool present in the rectal vault today, difficult to assess but likely has mild hemorrhoids. Currently on plavix for history of stroke. Bleeding has occurred intermittently for about a month. No rectal pain, primarily discomfort and itching.  Usually constipated but has been having loose stools secondary to palliative chemotherapy for breast cancer. Recent hgb stable. Last colonoscopy many years ago. Will treat supportively with anti hemorrhoid cream BID for 10 days and if no improvement could consider flexible sigmoidoscopy for further evaluation. Follow up in 4 weeks.   Presence of yeast appearing dermatitis to bilateral groins. Will treat with nystatin.   PLAN   Preparation H cream inserted twice daily  for 10 days Could consider flexible sigmoidoscopy if bleeding continues despite treatment with hemorrhoid cream. Apply nystatin cream to bilateral groin for 2 weeks.  Follow-up in 4 weeks   [name] Bonito, MSN, FNP-BC, AGACNP-BC [facility]  I have reviewed the note and agree with the APP's assessment as described in this progress note  If persistent  symptoms, can discuss potential flexible sigmoidoscopy versus full colonoscopy if she could tolerate taking the bowel prep.  Katrinka Blazing, MD Gastroenterology and Hepatology [facility]

## 2022-09-07 NOTE — Patient Instructions (Signed)
Use Preparation H cream twice daily for 10 days, inserted rectally.  Start nystatin ointment twice daily to bilateral groin for 2 weeks.  Will plan to follow-up in 4 weeks.  Notify of any worsening rectal bleeding.  It was a pleasure to see you today. I want to create trusting relationships with patients. If you receive a survey regarding your visit,  I greatly appreciate you taking time to fill this out on paper or through your MyChart. I value your feedback.  Brooke Bonito, MSN, FNP-BC, AGACNP-BC [facility]

## 2022-09-11 ENCOUNTER — Emergency Department (HOSPITAL_COMMUNITY): Payer: Medicare Other

## 2022-09-11 ENCOUNTER — Observation Stay (HOSPITAL_COMMUNITY)
Admission: EM | Admit: 2022-09-11 | Discharge: 2022-09-12 | Disposition: A | Discharge status: Other Acute Inpatient Hospital | Payer: Medicare Other | Attending: Family Medicine | Admitting: Family Medicine

## 2022-09-11 ENCOUNTER — Other Ambulatory Visit: Payer: Self-pay

## 2022-09-11 ENCOUNTER — Observation Stay (HOSPITAL_BASED_OUTPATIENT_CLINIC_OR_DEPARTMENT_OTHER): Payer: Medicare Other

## 2022-09-11 ENCOUNTER — Encounter (HOSPITAL_COMMUNITY): Payer: Self-pay | Admitting: Emergency Medicine

## 2022-09-11 DIAGNOSIS — K5901 Slow transit constipation: Secondary | ICD-10-CM | POA: Diagnosis present

## 2022-09-11 DIAGNOSIS — I7025 Atherosclerosis of native arteries of other extremities with ulceration: Secondary | ICD-10-CM | POA: Diagnosis present

## 2022-09-11 DIAGNOSIS — I4891 Unspecified atrial fibrillation: Principal | ICD-10-CM

## 2022-09-11 DIAGNOSIS — I129 Hypertensive chronic kidney disease with stage 1 through stage 4 chronic kidney disease, or unspecified chronic kidney disease: Secondary | ICD-10-CM | POA: Insufficient documentation

## 2022-09-11 DIAGNOSIS — G546 Phantom limb syndrome with pain: Secondary | ICD-10-CM | POA: Diagnosis not present

## 2022-09-11 DIAGNOSIS — Z8673 Personal history of transient ischemic attack (TIA), and cerebral infarction without residual deficits: Secondary | ICD-10-CM

## 2022-09-11 DIAGNOSIS — R0902 Hypoxemia: Secondary | ICD-10-CM | POA: Diagnosis not present

## 2022-09-11 DIAGNOSIS — R42 Dizziness and giddiness: Secondary | ICD-10-CM | POA: Diagnosis not present

## 2022-09-11 DIAGNOSIS — R339 Retention of urine, unspecified: Secondary | ICD-10-CM | POA: Diagnosis not present

## 2022-09-11 DIAGNOSIS — Z743 Need for continuous supervision: Secondary | ICD-10-CM | POA: Diagnosis not present

## 2022-09-11 DIAGNOSIS — N183 Chronic kidney disease, stage 3 unspecified: Secondary | ICD-10-CM | POA: Diagnosis present

## 2022-09-11 DIAGNOSIS — Z7902 Long term (current) use of antithrombotics/antiplatelets: Secondary | ICD-10-CM | POA: Diagnosis not present

## 2022-09-11 DIAGNOSIS — Z89512 Acquired absence of left leg below knee: Secondary | ICD-10-CM | POA: Diagnosis not present

## 2022-09-11 DIAGNOSIS — S88112S Complete traumatic amputation at level between knee and ankle, left lower leg, sequela: Secondary | ICD-10-CM

## 2022-09-11 DIAGNOSIS — I10 Essential (primary) hypertension: Secondary | ICD-10-CM | POA: Diagnosis not present

## 2022-09-11 DIAGNOSIS — Z79899 Other long term (current) drug therapy: Secondary | ICD-10-CM | POA: Diagnosis not present

## 2022-09-11 DIAGNOSIS — E1169 Type 2 diabetes mellitus with other specified complication: Secondary | ICD-10-CM | POA: Diagnosis present

## 2022-09-11 DIAGNOSIS — E1122 Type 2 diabetes mellitus with diabetic chronic kidney disease: Secondary | ICD-10-CM | POA: Insufficient documentation

## 2022-09-11 DIAGNOSIS — I499 Cardiac arrhythmia, unspecified: Secondary | ICD-10-CM | POA: Diagnosis not present

## 2022-09-11 DIAGNOSIS — Z853 Personal history of malignant neoplasm of breast: Secondary | ICD-10-CM | POA: Insufficient documentation

## 2022-09-11 DIAGNOSIS — I739 Peripheral vascular disease, unspecified: Secondary | ICD-10-CM | POA: Diagnosis present

## 2022-09-11 DIAGNOSIS — E782 Mixed hyperlipidemia: Secondary | ICD-10-CM | POA: Diagnosis not present

## 2022-09-11 DIAGNOSIS — E785 Hyperlipidemia, unspecified: Secondary | ICD-10-CM | POA: Diagnosis present

## 2022-09-11 DIAGNOSIS — R404 Transient alteration of awareness: Secondary | ICD-10-CM | POA: Diagnosis not present

## 2022-09-11 HISTORY — DX: Malignant (primary) neoplasm, unspecified: C80.1

## 2022-09-11 HISTORY — DX: Malignant neoplasm of unspecified site of unspecified female breast: C50.919

## 2022-09-11 LAB — ECHOCARDIOGRAM COMPLETE
AR max vel: 1.38 cm2
AV Area VTI: 1.43 cm2
AV Area mean vel: 1.3 cm2
AV Mean grad: 10 mmHg
AV Peak grad: 14.7 mmHg
Ao pk vel: 1.92 m/s
Area-P 1/2: 3.02 cm2
Height: 63 in
MV VTI: 1.58 cm2
S' Lateral: 2.9 cm
Weight: 2543.23 oz

## 2022-09-11 LAB — URINALYSIS, ROUTINE W REFLEX MICROSCOPIC
Bilirubin Urine: NEGATIVE
Glucose, UA: NEGATIVE mg/dL
Ketones, ur: NEGATIVE mg/dL
Nitrite: NEGATIVE
Protein, ur: 100 mg/dL — AB
RBC / HPF: >50 RBC/hpf (ref 0–5)
Specific Gravity, Urine: 1.017 (ref 1.005–1.030)
pH: 5 (ref 5.0–8.0)

## 2022-09-11 LAB — CBC
HCT: 44.5 % (ref 36.0–46.0)
Hemoglobin: 14.4 g/dL (ref 12.0–15.0)
MCH: 29.1 pg (ref 26.0–34.0)
MCHC: 32.4 g/dL (ref 30.0–36.0)
MCV: 89.9 fL (ref 80.0–100.0)
Platelets: 246 10*3/uL (ref 150–400)
RBC: 4.95 MIL/uL (ref 3.87–5.11)
RDW: 13.3 % (ref 11.5–15.5)
WBC: 7.4 10*3/uL (ref 4.0–10.5)
nRBC: 0 % (ref 0.0–0.2)

## 2022-09-11 LAB — BASIC METABOLIC PANEL
Anion gap: 13 (ref 5–15)
BUN: 18 mg/dL (ref 8–23)
CO2: 28 mmol/L (ref 22–32)
Calcium: 9.2 mg/dL (ref 8.9–10.3)
Chloride: 100 mmol/L (ref 98–111)
Creatinine, Ser: 1.35 mg/dL — ABNORMAL HIGH (ref 0.44–1.00)
GFR, Estimated: 37 mL/min — ABNORMAL LOW (ref >60)
Glucose, Bld: 179 mg/dL — ABNORMAL HIGH (ref 70–99)
Potassium: 3.7 mmol/L (ref 3.5–5.1)
Sodium: 141 mmol/L (ref 135–145)

## 2022-09-11 LAB — TROPONIN I (HIGH SENSITIVITY)
Troponin I (High Sensitivity): 20 ng/L — ABNORMAL HIGH (ref <18)
Troponin I (High Sensitivity): 21 ng/L — ABNORMAL HIGH (ref <18)

## 2022-09-11 LAB — CBG MONITORING, ED: Glucose-Capillary: 181 mg/dL — ABNORMAL HIGH (ref 70–99)

## 2022-09-11 LAB — TSH: TSH: 3.234 u[IU]/mL (ref 0.350–4.500)

## 2022-09-11 MED ORDER — GABAPENTIN 300 MG PO CAPS: 300.0000 mg | ORAL_CAPSULE | Freq: Every day | ORAL | Status: DC

## 2022-09-11 MED ORDER — HYDROCORTISONE (PERIANAL) 2.5 % EX CREA
1.0000 | TOPICAL_CREAM | Freq: Two times a day (BID) | CUTANEOUS | Status: DC
  Filled 2022-09-11: qty 28.35

## 2022-09-11 MED ORDER — METOPROLOL TARTRATE 25 MG PO TABS
12.5000 mg | ORAL_TABLET | Freq: Two times a day (BID) | ORAL | Status: DC
  Administered 2022-09-11: 12.5 mg via ORAL
  Filled 2022-09-11 (×3): qty 1

## 2022-09-11 MED ORDER — APIXABAN 5 MG PO TABS
5.0000 mg | ORAL_TABLET | Freq: Two times a day (BID) | ORAL | Status: DC
  Administered 2022-09-11 – 2022-09-12 (×3): 5 mg via ORAL
  Filled 2022-09-11 (×3): qty 1

## 2022-09-11 MED ORDER — FENTANYL CITRATE PF 50 MCG/ML IJ SOSY: 12.5000 ug | PREFILLED_SYRINGE | INTRAMUSCULAR | Status: DC | PRN

## 2022-09-11 MED ORDER — DILTIAZEM HCL-DEXTROSE 125-5 MG/125ML-% IV SOLN (PREMIX)
5.0000 mg/h | INTRAVENOUS | Status: DC
  Administered 2022-09-11: 5 mg/h via INTRAVENOUS
  Filled 2022-09-11: qty 125

## 2022-09-11 MED ORDER — GABAPENTIN 100 MG PO CAPS
100.0000 mg | ORAL_CAPSULE | Freq: Every day | ORAL | Status: DC
  Administered 2022-09-11: 100 mg via ORAL
  Filled 2022-09-11: qty 1

## 2022-09-11 MED ORDER — ONDANSETRON HCL 4 MG/2ML IJ SOLN: 4.0000 mg | Freq: Four times a day (QID) | INTRAMUSCULAR | Status: DC | PRN

## 2022-09-11 MED ORDER — HEPARIN (PORCINE) 25000 UT/250ML-% IV SOLN: 800.0000 [IU]/h | INTRAVENOUS | Status: DC

## 2022-09-11 MED ORDER — NYSTATIN 100000 UNIT/GM EX OINT
1.0000 | TOPICAL_OINTMENT | Freq: Two times a day (BID) | CUTANEOUS | Status: DC
  Administered 2022-09-12: 1 via TOPICAL
  Filled 2022-09-11: qty 15

## 2022-09-11 MED ORDER — LETROZOLE 2.5 MG PO TABS
2.5000 mg | ORAL_TABLET | Freq: Every day | ORAL | Status: DC
  Administered 2022-09-11 – 2022-09-12 (×2): 2.5 mg via ORAL
  Filled 2022-09-11 (×3): qty 1

## 2022-09-11 MED ORDER — FUROSEMIDE 20 MG PO TABS
20.0000 mg | ORAL_TABLET | Freq: Every day | ORAL | Status: DC
  Administered 2022-09-12: 20 mg via ORAL
  Filled 2022-09-11: qty 1

## 2022-09-11 MED ORDER — ONDANSETRON HCL 4 MG PO TABS: 4.0000 mg | ORAL_TABLET | Freq: Four times a day (QID) | ORAL | Status: DC | PRN

## 2022-09-11 MED ORDER — PRAVASTATIN SODIUM 40 MG PO TABS
40.0000 mg | ORAL_TABLET | Freq: Every day | ORAL | Status: DC
  Administered 2022-09-11: 40 mg via ORAL
  Filled 2022-09-11: qty 1

## 2022-09-11 MED ORDER — BISACODYL 5 MG PO TBEC: 5.0000 mg | DELAYED_RELEASE_TABLET | Freq: Every day | ORAL | Status: DC | PRN

## 2022-09-11 MED ORDER — ACETAMINOPHEN 325 MG PO TABS: 650.0000 mg | ORAL_TABLET | Freq: Four times a day (QID) | ORAL | Status: DC | PRN

## 2022-09-11 MED ORDER — HEPARIN BOLUS VIA INFUSION: 2000.0000 [IU] | Freq: Once | INTRAVENOUS | Status: DC

## 2022-09-11 MED ORDER — DILTIAZEM LOAD VIA INFUSION
10.0000 mg | Freq: Once | INTRAVENOUS | Status: AC
  Administered 2022-09-11: 10 mg via INTRAVENOUS
  Filled 2022-09-11: qty 10

## 2022-09-11 MED ORDER — AMLODIPINE BESYLATE 5 MG PO TABS
5.0000 mg | ORAL_TABLET | Freq: Every day | ORAL | Status: DC
  Administered 2022-09-11 – 2022-09-12 (×2): 5 mg via ORAL
  Filled 2022-09-11 (×2): qty 1

## 2022-09-11 MED ORDER — ACETAMINOPHEN 650 MG RE SUPP: 650.0000 mg | Freq: Four times a day (QID) | RECTAL | Status: DC | PRN

## 2022-09-11 NOTE — Progress Notes (Signed)
  Echocardiogram 2D Echocardiogram has been performed.  Milda Smart 09/11/2022, 4:01 PM

## 2022-09-11 NOTE — H&P (Addendum)
History and Physical  [facility]  [name] Petersen ZOX:[id number] DOB: Nov 09, 1926 DOA: 09/11/2022  Oncologist: Dr. Towana Badger  Patient coming from: [facility] by [facility]  Level of care: Telemetry  I have personally briefly reviewed patient's old medical records in [facility]  Chief Complaint: dizziness   HPI: [name] Petersen is a 89 year old female with stage Ib clinical right breast cancer 3 triple positive currently on palliative neoadjuvant endocrine therapy plus Herceptin, diverticulosis, history of rectal bleeding, cholelithiasis, benign-appearing hepatic and renal cysts, hyperlipidemia, chronic constipation, hypertension, macular degeneration, peripheral artery disease, history of stroke, diet-controlled diabetes mellitus type 2, peripheral neuropathy, vitamin D deficiency who is currently a long-term resident at [facility].  She presented to the ED by EMS from [facility] after experiencing near syncope on the toilet and complaints of dizziness and palpitation.  There was no loss of consciousness.  No chest pain or shortness of breath.  She was recently evaluated by GI for rectal bleeding but that has since resolved.  EMS noted that patient was in atrial fibrillation with RVR and it was confirmed on EKG done in the emergency department.  She had been on Plavix recently for cerebrovascular disease after CVA.  She was initially started on IV diltiazem in the emergency department however she quickly converted to normal sinus rhythm and the diltiazem was discontinued.  She was started on IV heparin infusion and admission was requested.    Past Medical History:  Diagnosis Date   Arthritis    hands   Atherosclerotic PVD with ulceration (HCC) 07/15/2017   RIGHT HEEL   Breast cancer (HCC)    Cancer (HCC)    Complication of anesthesia    lost eyesight for a few days after surgery in the 1950's   Constipation    Diabetes mellitus without complication (HCC)    no  medications    Hypertension    Macular degeneration, bilateral    "one eye wet; one eye dry; she gets shots" (07/07/2017)   PAD (peripheral artery disease) (HCC)    Pneumonia    Stroke (HCC)    some trouble swallowing    Past Surgical History:  Procedure Laterality Date   ABDOMINAL AORTOGRAM W/LOWER EXTREMITY N/A 06/16/2017   Procedure: ABDOMINAL AORTOGRAM W/LOWER EXTREMITY;  Surgeon: Fransisco Hertz, MD;  Location: [facility] INVASIVE CV LAB;  Service: Cardiovascular;  Laterality: N/A;  lt extermity   ABDOMINAL HYSTERECTOMY     AMPUTATION Left 07/06/2017   Procedure: AMPUTATION BELOW KNEE LEFT;  Surgeon: Larina Earthly, MD;  Location: [facility] OR;  Service: Vascular;  Laterality: Left;   CATARACT EXTRACTION W/ INTRAOCULAR LENS  IMPLANT, BILATERAL     DILATION AND CURETTAGE OF UTERUS     LOWER EXTREMITY ANGIOGRAPHY N/A 07/14/2017   Procedure: LOWER EXTREMITY ANGIOGRAPHY;  Surgeon: Chuck Hint, MD;  Location: [facility] INVASIVE CV LAB;  Service: Cardiovascular;  Laterality: N/A;     reports that she has never smoked. She has never used smokeless tobacco. She reports that she does not drink alcohol and does not use drugs.  Allergies  Allergen Reactions   Scopace [Scopolamine] Other (See Comments)    Hallucinations    Family History  Adopted: Yes    Prior to Admission medications   Medication Sig Start Date End Date Taking? Authorizing Provider  acetaminophen (TYLENOL) 325 MG tablet Take 1-2 tablets (325-650 mg total) by mouth every 6 (six) hours as needed for mild pain. Patient taking differently: Take 1,000 mg by mouth  at bedtime as needed for mild pain. 07/29/17  Yes Love, Evlyn Kanner, PA-C  amLODipine (NORVASC) 5 MG tablet Take 5 mg by mouth daily.   Yes [provider]  beta carotene w/minerals (OCUVITE) tablet Take 1 tablet by mouth daily.   Yes [provider]  calcium carbonate (TUMS - DOSED IN MG ELEMENTAL CALCIUM) 500 MG chewable tablet Chew 2 tablets by mouth as needed  for indigestion or heartburn.   Yes [provider]  Cholecalciferol (VITAMIN D3) 50 MCG (2000 UT) capsule Take 2,000 Units by mouth daily.   Yes [provider]  clopidogrel (PLAVIX) 75 MG tablet Take 75 mg by mouth every evening. 04/03/14  Yes [provider]  furosemide (LASIX) 20 MG tablet Take 20 mg by mouth daily. Edema   Yes [provider]  gabapentin (NEURONTIN) 300 MG capsule Take 300 mg by mouth at bedtime.   Yes [provider]  hydrocortisone (ANUSOL-HC) 2.5 % rectal cream Place 1 Application rectally 2 (two) times daily.   Yes [provider]  letrozole (FEMARA) 2.5 MG tablet Take 2.5 mg by mouth daily.   Yes [provider]  lovastatin (MEVACOR) 20 MG tablet Take 20 mg by mouth at bedtime.   Yes [provider]  Menthol, Topical Analgesic, (BIOFREEZE) 4 % GEL Apply topically as needed.   Yes [provider]  Multiple Vitamins-Minerals (MULTIVITAMIN WITH MINERALS) tablet Take 1 tablet by mouth daily.   Yes [provider]  nystatin ointment (MYCOSTATIN) Apply 1 Application topically 2 (two) times daily. 09/07/22  Yes Mahon, Frederik Schmidt, NP  phenylephrine-shark liver oil-mineral oil-petrolatum (PREPARATION H) 0.25-3-14-71.9 % rectal ointment Place 1 Application rectally 2 (two) times daily. Apply internally for 10 days and then as needed. 09/07/22  Yes Mahon, Courtney L, NP  amLODipine (NORVASC) 10 MG tablet Take 1 tablet (10 mg total) by mouth daily. Patient taking differently: Take 5 mg by mouth daily. 07/29/17   Love, Evlyn Kanner, PA-C  cyanocobalamin 1000 MCG tablet Take 1,000 mcg by mouth daily.    [provider]  lovastatin (MEVACOR) 20 MG tablet Take 20 mg by mouth daily at 6 PM. 11/30/18   [provider]  trastuzumab 4 mg/kg in sodium chloride 0.9 % 250 mL Inject 4 mg/kg into the vein once.    [provider]    Physical Exam: Vitals:   09/11/22 0700 09/11/22 0737  09/11/22 0956 09/11/22 1142  BP:  (!) 161/79 (!) 161/79 (!) 150/76  Pulse:  61 61 (!) 55  Resp:  16 16 18   Temp:  97.7 F (36.5 C) 97.7 F (36.5 C) 97.8 F (36.6 C)  TempSrc:   Oral Oral  SpO2:  96%  95%  Weight: 72.1 kg  72.1 kg   Height:   5\' 3"  (1.6 m)     Constitutional: frail, elderly female, awake, alert, NAD, calm, comfortable Eyes: PERRL, lids and conjunctivae normal ENMT: Mucous membranes are moist. Posterior pharynx clear of any exudate or lesions.  Neck: normal, supple, no masses, no thyromegaly Respiratory: clear to auscultation bilaterally, no wheezing, no crackles. Normal respiratory effort. No accessory muscle use.  Cardiovascular: normal s1, s2 sounds, no murmurs / rubs / gallops. No extremity edema. 2+ pedal pulses. No carotid bruits.  Abdomen: no tenderness, no masses palpated. No hepatosplenomegaly. Bowel sounds positive.  Musculoskeletal: no clubbing / cyanosis. No joint deformity upper and lower extremities. Good ROM, no contractures. Normal muscle tone.  Skin: no rashes, lesions, ulcers. No  induration Neurologic: CN 2-12 grossly intact. Sensation intact, DTR normal. Strength 5/5 in all 4.  Psychiatric: Normal judgment and insight. Alert and oriented x 3. Normal mood.   Labs on Admission: I have personally reviewed following labs and imaging studies  CBC: Recent Labs  Lab 09/11/22 0305  WBC 7.4  HGB 14.4  HCT 44.5  MCV 89.9  PLT 246   Basic Metabolic Panel: Recent Labs  Lab 09/11/22 0305  NA 141  K 3.7  CL 100  CO2 28  GLUCOSE 179*  BUN 18  CREATININE 1.35*  CALCIUM 9.2   GFR: Estimated Creatinine Clearance: 24.8 mL/min (A) (by C-G formula based on SCr of 1.35 mg/dL (H)). Liver Function Tests: No results for input(s): "AST", "ALT", "ALKPHOS", "BILITOT", "PROT", "ALBUMIN" in the last 168 hours. No results for input(s): "LIPASE", "AMYLASE" in the last 168 hours. No results for input(s): "AMMONIA" in the last 168 hours. Coagulation  Profile: No results for input(s): "INR", "PROTIME" in the last 168 hours. Cardiac Enzymes: No results for input(s): "CKTOTAL", "CKMB", "CKMBINDEX", "TROPONINI" in the last 168 hours. BNP (last 3 results) No results for input(s): "PROBNP" in the last 8760 hours. HbA1C: No results for input(s): "HGBA1C" in the last 72 hours. CBG: Recent Labs  Lab 09/11/22 0305  GLUCAP 181*   Lipid Profile: No results for input(s): "CHOL", "HDL", "LDLCALC", "TRIG", "CHOLHDL", "LDLDIRECT" in the last 72 hours. Thyroid Function Tests: Recent Labs    09/11/22 0811  TSH 3.234   Anemia Panel: No results for input(s): "VITAMINB12", "FOLATE", "FERRITIN", "TIBC", "IRON", "RETICCTPCT" in the last 72 hours. Urine analysis:    Component Value Date/Time   COLORURINE YELLOW 08/22/2020 2133   APPEARANCEUR CLEAR 08/22/2020 2133   LABSPEC 1.011 08/22/2020 2133   PHURINE 5.0 08/22/2020 2133   GLUCOSEU NEGATIVE 08/22/2020 2133   HGBUR NEGATIVE 08/22/2020 2133   BILIRUBINUR NEGATIVE 08/22/2020 2133   KETONESUR 5 (A) 08/22/2020 2133   PROTEINUR NEGATIVE 08/22/2020 2133   UROBILINOGEN 0.2 04/27/2014 1846   NITRITE NEGATIVE 08/22/2020 2133   LEUKOCYTESUR NEGATIVE 08/22/2020 2133    Radiological Exams on Admission: DG Chest Port 1 View  Result Date: 09/11/2022 CLINICAL DATA:  Dizziness, new onset AFib. EXAM: PORTABLE CHEST 1 VIEW COMPARISON:  08/27/2020. FINDINGS: The heart is enlarged and the mediastinal contour is within normal limits. There is atherosclerotic calcification of the aorta. Lung volumes are low with mild atelectasis or scarring at the lung bases. No consolidation, effusion, or pneumothorax. No acute osseous abnormality. IMPRESSION: Low lung volumes with atelectasis or scarring at the lung bases. Electronically Signed   By: Thornell Sartorius M.D.   On: 09/11/2022 04:23    EKG: Independently reviewed. Atrial fibrillation with RVR   Assessment/Plan Principal Problem:   Atrial fibrillation with RVR  (HCC) Active Problems:   Atherosclerosis of native arteries of the extremities with ulceration (HCC)   PAD (peripheral artery disease) (HCC)   S/P below knee amputation, left (HCC)   Essential hypertension   Stage 3 chronic kidney disease (HCC)   Type 2 diabetes mellitus with obesity (HCC)   Slow transit constipation   Phantom limb pain (HCC)   Unilateral complete BKA, left, sequela (HCC)   Urinary retention   History of stroke   Hyperlipidemia   Atrial fibrillation with RVR - fortunately patient converted to sinus rhythm after brief time on IV diltiazem infusion - metoprolol 12.5 mg BID started for HR control - CHA2DS2-VASc score at least 4 or more - discussion with patient and daughter  at bedside about risks of full anticoagulation, pt insists that rectal bleeding has stopped, they want to proceed with full anticoagulation and understand risks of severe bleeding event, etc. - pharm D consulted to dose apixaban  - monitor for bleeding complication  - monitor on cardiac telemetry  - TTE pending  - check TSH   Essential hypertension  - starting metoprolol 12.5 mg BID  - monitor BP response  - hold amlodipine to give room for metoprolol  - could add back lower dose of amlodipine if needed for BP   History of CVA  - DC plavix since she is starting apixaban  - family does not want to risk having patient on plavix and apixaban  - apixaban for stroke prevention   Type 2 diabetes mellitus with neuropathy - follow up A1c test  -  has been diet controlled per patient  Hyperlipidemia - check lipid panel  - resume home lipid lowering medication   Phantom limb pain s/p left BKA - resume home gabapentin medication   Right breast cancer - Stage IB triple positive - palliative herceptin and letrozole  - [facility]    DVT prophylaxis: apixaban   Code Status: DNR reviewed ACP documents   Family Communication: daughter at bedside    Disposition Plan: return to [facility] on 5/26 if remains stable   Consults called:   Admission status: OBV   Level of care: Telemetry Standley Dakins MD [facility] How to contact the [facility] Attending or Consulting provider 7A - 7P or covering provider during after hours 7P -7A, for this patient?  Check the care team in [facility] and look for a) attending/consulting [facility] provider listed and b) the [facility] team listed Log into [email or web address] and use [place]'s universal password to access. If you do not have the password, please contact the hospital operator. Locate the [facility] provider you are looking for under [facility] and page to a number that you can be directly reached. If you still have difficulty reaching the provider, please page the [facility] (Director on Call) for the Hospitalists listed on amion for assistance.   If 7PM-7AM, please contact night-coverage [email or web address] Password [facility]  09/11/2022, 12:30 PM

## 2022-09-11 NOTE — ED Provider Notes (Signed)
[place] [facility] AT [facility]  Provider Note  CSN: [id number] Arrival date & time: 09/11/22 0249  History Chief Complaint  Patient presents with   Near Syncope    [name] Petersen is a 89 y.o. female brought to the ED via EMS from [facility] for dizziness. She reports she was using the restroom a short time prior to arrival when she began to feel lightheaded and dizzy and her heart was racing. Denies any LOC. No CP. No prior history of same. She was seen recently for rectal bleeding but states that has since stopped. She was previously on Plavix, but I do not see it listed on her MAR now. EMS noted she was in Afib with them, no history of same.    Home Medications Prior to Admission medications   Medication Sig Start Date End Date Taking? Authorizing Provider  acetaminophen (TYLENOL) 325 MG tablet Take 1-2 tablets (325-650 mg total) by mouth every 6 (six) hours as needed for mild pain. Patient taking differently: Take 1,000 mg by mouth at bedtime as needed for mild pain. 07/29/17  Yes Love, Evlyn Kanner, PA-C  amLODipine (NORVASC) 5 MG tablet Take 5 mg by mouth daily.   Yes [provider]  beta carotene w/minerals (OCUVITE) tablet Take 1 tablet by mouth daily.   Yes [provider]  calcium carbonate (TUMS - DOSED IN MG ELEMENTAL CALCIUM) 500 MG chewable tablet Chew 2 tablets by mouth as needed for indigestion or heartburn.   Yes [provider]  Cholecalciferol (VITAMIN D3) 50 MCG (2000 UT) capsule Take 2,000 Units by mouth daily.   Yes [provider]  clopidogrel (PLAVIX) 75 MG tablet Take 75 mg by mouth every evening. 04/03/14  Yes [provider]  furosemide (LASIX) 20 MG tablet Take 20 mg by mouth daily. Edema   Yes [provider]  gabapentin (NEURONTIN) 300 MG capsule Take 300 mg by mouth at bedtime.   Yes [provider]  hydrocortisone (ANUSOL-HC) 2.5 % rectal cream Place 1 Application  rectally 2 (two) times daily.   Yes [provider]  letrozole (FEMARA) 2.5 MG tablet Take 2.5 mg by mouth daily.   Yes [provider]  lovastatin (MEVACOR) 20 MG tablet Take 20 mg by mouth at bedtime.   Yes [provider]  Menthol, Topical Analgesic, (BIOFREEZE) 4 % GEL Apply topically as needed.   Yes [provider]  Multiple Vitamins-Minerals (MULTIVITAMIN WITH MINERALS) tablet Take 1 tablet by mouth daily.   Yes [provider]  nystatin ointment (MYCOSTATIN) Apply 1 Application topically 2 (two) times daily. 09/07/22  Yes Mahon, Frederik Schmidt, NP  phenylephrine-shark liver oil-mineral oil-petrolatum (PREPARATION H) 0.25-3-14-71.9 % rectal ointment Place 1 Application rectally 2 (two) times daily. Apply internally for 10 days and then as needed. 09/07/22  Yes Mahon, Courtney L, NP  amLODipine (NORVASC) 10 MG tablet Take 1 tablet (10 mg total) by mouth daily. Patient taking differently: Take 5 mg by mouth daily. 07/29/17   Love, Evlyn Kanner, PA-C  cyanocobalamin 1000 MCG tablet Take 1,000 mcg by mouth daily.    [provider]  lovastatin (MEVACOR) 20 MG tablet Take 20 mg by mouth daily at 6 PM. 11/30/18   [provider]  trastuzumab 4 mg/kg in sodium chloride 0.9 % 250 mL Inject 4 mg/kg into the vein once.    [provider]     Allergies    Scopace [scopolamine]   Review of Systems  Review of Systems Please see HPI for pertinent positives and negatives  Physical Exam BP (!) 143/79   Pulse 65   Temp 98.4 F (36.9 C)   Resp 18   Ht 5\' 3"  (1.6 m)   Wt 71 kg   SpO2 95%   BMI 27.73 kg/m   Physical Exam Vitals and nursing note reviewed.  Constitutional:      Appearance: Normal appearance.  HENT:     Head: Normocephalic and atraumatic.     Nose: Nose normal.     Mouth/Throat:     Mouth: Mucous membranes are moist.  Eyes:     Extraocular Movements: Extraocular movements intact.     Conjunctiva/sclera:  Conjunctivae normal.  Cardiovascular:     Rate and Rhythm: Tachycardia present. Rhythm irregular.  Pulmonary:     Effort: Pulmonary effort is normal.     Breath sounds: Normal breath sounds.  Abdominal:     General: Abdomen is flat.     Palpations: Abdomen is soft.     Tenderness: There is no abdominal tenderness.  Musculoskeletal:        General: No swelling. Normal range of motion.     Cervical back: Neck supple.     Comments: S/p L BKA  Skin:    General: Skin is warm and dry.  Neurological:     General: No focal deficit present.     Mental Status: She is alert.  Psychiatric:        Mood and Affect: Mood normal.     ED Results / Procedures / Treatments   EKG EKG Interpretation  Date/Time:  Saturday Sep 11 2022 05:16:02 EDT Ventricular Rate:  65 PR Interval:  230 QRS Duration: 83 QT Interval:  399 QTC Calculation: 415 R Axis:   21 Text Interpretation: Sinus rhythm Prolonged PR interval Since last tracing Sinus rhythm has replaced Atrial fibrillation Confirmed by Susy Frizzle [phone]) on 09/11/2022 5:21:41 AM  Procedures .Critical Care  Performed by: Pollyann Savoy, MD Authorized by: Pollyann Savoy, MD   Critical care provider statement:    Critical care time (minutes):  50   Critical care time was exclusive of:  Separately billable procedures and treating other patients   Critical care was necessary to treat or prevent imminent or life-threatening deterioration of the following conditions:  Circulatory failure   Critical care was time spent personally by me on the following activities:  Development of treatment plan with patient or surrogate, discussions with consultants, evaluation of patient's response to treatment, examination of patient, ordering and review of laboratory studies, ordering and review of radiographic studies, ordering and performing treatments and interventions, pulse oximetry, re-evaluation of patient's condition and review of old charts    Care discussed with: admitting provider     Medications Ordered in the ED Medications  diltiazem (CARDIZEM) 1 mg/mL load via infusion 10 mg (10 mg Intravenous Bolus from Bag 09/11/22 0344)    And  diltiazem (CARDIZEM) 125 mg in dextrose 5% 125 mL (1 mg/mL) infusion (0 mg/hr Intravenous Stopped 09/11/22 0611)    Initial Impression and Plan  Patient with no history of afib noted to be in rapid afib on arrival. She has been symptomatic with dizziness and palpitations. Will check labs and begin cardizem for rate control. Not a candidate for cardioversion given she has not been on anticoagulation.   ED Course   Clinical Course as of 09/11/22 5784  Sat Sep 11, 2022  6962 CBC is normal.  [CS]  9562 BMP with mild CKD, similar to previous.  [CS]  [id number] Initial trop is mildly elevated.  [CS]  0405 HR improved but remains in afib.  [CS]  [id number] I personally viewed the images from radiology studies and agree with radiologist interpretation: CXR without edema [CS]  0523 Patient has converted to NSR. [CS]  0602 Repeat trop is flat. Will discuss admission with hospitalist.  [CS]  [phone] Spoke with Dr. Carren Rang, Hospitalist, who will evaluate the patient for admission.  [CS]    Clinical Course User Index [CS] Pollyann Savoy, MD     MDM Rules/Calculators/A&P Medical Decision Making Problems Addressed: Atrial fibrillation with rapid ventricular response [facility]): acute illness or injury  Amount and/or Complexity of Data Reviewed Labs: ordered. Decision-making details documented in ED Course. Radiology: ordered and independent interpretation performed. Decision-making details documented in ED Course. ECG/medicine tests: ordered and independent interpretation performed. Decision-making details documented in ED Course.  Risk Prescription drug management. Decision regarding hospitalization.     Final Clinical Impression(s) / ED Diagnoses Final diagnoses:  Atrial fibrillation with rapid  ventricular response [facility])    Rx / DC Orders ED Discharge Orders     None        Pollyann Savoy, MD 09/11/22 [id number]

## 2022-09-11 NOTE — Progress Notes (Signed)
ANTICOAGULATION CONSULT NOTE -   Pharmacy Consult for heparin=> eliquis Indication: atrial fibrillation  Allergies  Allergen Reactions   Scopace [Scopolamine] Other (See Comments)    Hallucinations    Patient Measurements: Height: 5\' 3"  (160 cm) Weight: 72.1 kg (158 lb 15.2 oz) IBW/kg (Calculated) : 52.4 Heparin Dosing Weight: 65kg  Vital Signs: Temp: 97.7 F (36.5 C) (05/25 0737) BP: 161/79 (05/25 0737) Pulse Rate: 61 (05/25 0737)  Labs: Recent Labs    09/11/22 0305 09/11/22 0505  HGB 14.4  --   HCT 44.5  --   PLT 246  --   CREATININE 1.35*  --   TROPONINIHS 20* 21*     Estimated Creatinine Clearance: 24.8 mL/min (A) (by C-G formula based on SCr of 1.35 mg/dL (H)).   Medical History: Past Medical History:  Diagnosis Date   Arthritis    hands   Atherosclerotic PVD with ulceration (HCC) 07/15/2017   RIGHT HEEL   Breast cancer (HCC)    Cancer (HCC)    Complication of anesthesia    lost eyesight for a few days after surgery in the 1950's   Constipation    Diabetes mellitus without complication (HCC)    no medications    Hypertension    Macular degeneration, bilateral    "one eye wet; one eye dry; she gets shots" (07/07/2017)   PAD (peripheral artery disease) (HCC)    Pneumonia    Stroke (HCC)    some trouble swallowing    Assessment: 89yo female c/o near-syncope, found to be in Afib w/ RVR >> to begin heparin. Now plan to transition patient to oral tx with eliquis  Goal of Therapy:  Heparin level 0.3-0.7 units/ml Monitor platelets by anticoagulation protocol: Yes   Plan:  D/C heparin Eliquis 5mg  po bid Educate on eliquis Monitor for S/S of bleeding  Elder Cyphers, BS Pharm D, BCPS Clinical Pharmacist 09/11/2022,8:23 AM

## 2022-09-11 NOTE — Plan of Care (Signed)
  Problem: Pain Managment: Goal: General experience of comfort will improve Outcome: Progressing   Problem: Safety: Goal: Ability to remain free from injury will improve Outcome: Progressing   Problem: Skin Integrity: Goal: Risk for impaired skin integrity will decrease Outcome: Progressing   

## 2022-09-11 NOTE — ED Triage Notes (Signed)
Pt brought in from [facility] via EMS after she had a near-syncopal episode while on the toilet. Per EMs, pt in A-Fib.

## 2022-09-11 NOTE — Hospital Course (Signed)
89 year old female with stage Ib clinical right breast cancer 3 triple positive currently on palliative neoadjuvant endocrine therapy plus Herceptin, diverticulosis, history of rectal bleeding, cholelithiasis, benign-appearing hepatic and renal cysts, hyperlipidemia, chronic constipation, hypertension, macular degeneration, peripheral artery disease, history of stroke, diet-controlled diabetes mellitus type 2, peripheral neuropathy, vitamin D deficiency who is currently a long-term resident at [facility].  She presented to the ED by EMS from [facility] after experiencing near syncope on the toilet and complaints of dizziness and palpitation.  There was no loss of consciousness.  No chest pain or shortness of breath.  She was recently evaluated by GI for rectal bleeding but that has since resolved.  EMS noted that patient was in atrial fibrillation with RVR and it was confirmed on EKG done in the emergency department.  She had been on Plavix recently for cerebrovascular disease after CVA.  She was initially started on IV diltiazem in the emergency department however she quickly converted to normal sinus rhythm and the diltiazem was discontinued.  She was started on IV heparin infusion and admission was requested.

## 2022-09-11 NOTE — Progress Notes (Signed)
ANTICOAGULATION CONSULT NOTE - Initial Consult  Pharmacy Consult for heparin Indication: atrial fibrillation  Allergies  Allergen Reactions   Scopace [Scopolamine] Other (See Comments)    Hallucinations    Patient Measurements: Height: 5\' 3"  (160 cm) Weight: 71 kg (156 lb 8.4 oz) IBW/kg (Calculated) : 52.4 Heparin Dosing Weight: 65kg  Vital Signs: Temp: 98.4 F (36.9 C) (05/25 0300) BP: 143/79 (05/25 0600) Pulse Rate: 65 (05/25 0600)  Labs: Recent Labs    09/11/22 0305 09/11/22 0505  HGB 14.4  --   HCT 44.5  --   PLT 246  --   CREATININE 1.35*  --   TROPONINIHS 20* 21*    Estimated Creatinine Clearance: 24.6 mL/min (A) (by C-G formula based on SCr of 1.35 mg/dL (H)).   Medical History: Past Medical History:  Diagnosis Date   Arthritis    hands   Atherosclerotic PVD with ulceration (HCC) 07/15/2017   RIGHT HEEL   Breast cancer (HCC)    Cancer (HCC)    Complication of anesthesia    lost eyesight for a few days after surgery in the 1950's   Constipation    Diabetes mellitus without complication (HCC)    no medications    Hypertension    Macular degeneration, bilateral    "one eye wet; one eye dry; she gets shots" (07/07/2017)   PAD (peripheral artery disease) (HCC)    Pneumonia    Stroke (HCC)    some trouble swallowing    Assessment: 89yo female c/o near-syncope, found to be in Afib w/ RVR >> to begin heparin.  Goal of Therapy:  Heparin level 0.3-0.7 units/ml Monitor platelets by anticoagulation protocol: Yes   Plan:  Heparin 2000 units IV bolus x1 followed by infusion at 800 units/hr. Monitor heparin levels and CBC.  Vernard Gambles, PharmD, BCPS  09/11/2022,6:38 AM

## 2022-09-11 NOTE — Progress Notes (Signed)
N.O to bladder scan. 0ml.  No urine retaining at this time.

## 2022-09-12 DIAGNOSIS — E782 Mixed hyperlipidemia: Secondary | ICD-10-CM

## 2022-09-12 DIAGNOSIS — I10 Essential (primary) hypertension: Secondary | ICD-10-CM | POA: Diagnosis not present

## 2022-09-12 DIAGNOSIS — G546 Phantom limb syndrome with pain: Secondary | ICD-10-CM | POA: Diagnosis not present

## 2022-09-12 DIAGNOSIS — I4891 Unspecified atrial fibrillation: Secondary | ICD-10-CM | POA: Diagnosis not present

## 2022-09-12 LAB — CBC
HCT: 42.4 % (ref 36.0–46.0)
Hemoglobin: 13.5 g/dL (ref 12.0–15.0)
MCH: 29.1 pg (ref 26.0–34.0)
MCHC: 31.8 g/dL (ref 30.0–36.0)
MCV: 91.4 fL (ref 80.0–100.0)
Platelets: 222 10*3/uL (ref 150–400)
RBC: 4.64 MIL/uL (ref 3.87–5.11)
RDW: 13.5 % (ref 11.5–15.5)
WBC: 7 10*3/uL (ref 4.0–10.5)
nRBC: 0 % (ref 0.0–0.2)

## 2022-09-12 LAB — BASIC METABOLIC PANEL
Anion gap: 13 (ref 5–15)
BUN: 22 mg/dL (ref 8–23)
CO2: 24 mmol/L (ref 22–32)
Calcium: 9.1 mg/dL (ref 8.9–10.3)
Chloride: 101 mmol/L (ref 98–111)
Creatinine, Ser: 1.32 mg/dL — ABNORMAL HIGH (ref 0.44–1.00)
GFR, Estimated: 38 mL/min — ABNORMAL LOW (ref >60)
Glucose, Bld: 135 mg/dL — ABNORMAL HIGH (ref 70–99)
Potassium: 3.6 mmol/L (ref 3.5–5.1)
Sodium: 138 mmol/L (ref 135–145)

## 2022-09-12 LAB — MAGNESIUM: Magnesium: 2 mg/dL (ref 1.7–2.4)

## 2022-09-12 MED ORDER — APIXABAN 5 MG PO TABS: 5.0000 mg | ORAL_TABLET | Freq: Two times a day (BID) | ORAL | 2 refills | Status: AC

## 2022-09-12 MED ORDER — HYDRALAZINE HCL 25 MG PO TABS
25.0000 mg | ORAL_TABLET | Freq: Three times a day (TID) | ORAL | Status: DC | PRN
  Administered 2022-09-12: 25 mg via ORAL
  Filled 2022-09-12: qty 1

## 2022-09-12 MED ORDER — HYDRALAZINE HCL 20 MG/ML IJ SOLN: 10.0000 mg | INTRAMUSCULAR | Status: DC | PRN

## 2022-09-12 MED ORDER — ACETAMINOPHEN 325 MG PO TABS: 1000.0000 mg | ORAL_TABLET | Freq: Every evening | ORAL | Status: AC | PRN

## 2022-09-12 MED ORDER — METOPROLOL TARTRATE 25 MG PO TABS: 12.5000 mg | ORAL_TABLET | Freq: Two times a day (BID) | ORAL | 2 refills | Status: DC

## 2022-09-12 NOTE — Progress Notes (Signed)
Notified provider of BP. See new orders.     09/12/22 1020  Vitals  BP (!) 185/75 (notified MD / see new orders)  MAP (mmHg) 105  BP Location Right Arm  BP Method Automatic  Patient Position (if appropriate) Sitting  Pulse Rate (!) 56  Pulse Rate Source Dinamap  MEWS COLOR  MEWS Score Color Green  MEWS Score  MEWS Temp 0  MEWS Systolic 0  MEWS Pulse 0  MEWS RR 0  MEWS LOC 0  MEWS Score 0

## 2022-09-12 NOTE — Progress Notes (Signed)
Called report to [employer] and [employer].

## 2022-09-12 NOTE — Care Management Obs Status (Signed)
MEDICARE OBSERVATION STATUS NOTIFICATION   Patient Details  Name: [name] Petersen MRN: [id number] Date of Birth: 17-Oct-1926   Medicare Observation Status Notification Given:  Yes    [name] Dolly, LCSW 09/12/2022, 11:01 AM

## 2022-09-12 NOTE — Discharge Instructions (Signed)
IMPORTANT INFORMATION: PAY CLOSE ATTENTION   PHYSICIAN DISCHARGE INSTRUCTIONS  Follow with Primary care provider  Pcp, No  and other consultants as instructed by your Hospitalist Physician  SEEK MEDICAL CARE OR RETURN TO EMERGENCY ROOM IF SYMPTOMS COME BACK, WORSEN OR NEW PROBLEM DEVELOPS   Please note: You were cared for by a hospitalist during your hospital stay. Every effort will be made to forward records to your primary care provider.  You can request that your primary care provider send for your hospital records if they have not received them.  Once you are discharged, your primary care physician will handle any further medical issues. Please note that NO REFILLS for any discharge medications will be authorized once you are discharged, as it is imperative that you return to your primary care physician (or establish a relationship with a primary care physician if you do not have one) for your post hospital discharge needs so that they can reassess your need for medications and monitor your lab values.  Please get a complete blood count and chemistry panel checked by your Primary MD at your next visit, and again as instructed by your Primary MD.  Get Medicines reviewed and adjusted: Please take all your medications with you for your next visit with your Primary MD  Laboratory/radiological data: Please request your Primary MD to go over all hospital tests and procedure/radiological results at the follow up, please ask your primary care provider to get all Hospital records sent to his/her office.  In some cases, they will be blood work, cultures and biopsy results pending at the time of your discharge. Please request that your primary care provider follow up on these results.  If you are diabetic, please bring your blood sugar readings with you to your follow up appointment with primary care.    Please call and make your follow up appointments as soon as possible.    Also Note the  following: If you experience worsening of your admission symptoms, develop shortness of breath, life threatening emergency, suicidal or homicidal thoughts you must seek medical attention immediately by calling 911 or calling your MD immediately  if symptoms less severe.  You must read complete instructions/literature along with all the possible adverse reactions/side effects for all the Medicines you take and that have been prescribed to you. Take any new Medicines after you have completely understood and accpet all the possible adverse reactions/side effects.   Do not drive when taking Pain medications or sleeping medications (Benzodiazepines)  Do not take more than prescribed Pain, Sleep and Anxiety Medications. It is not advisable to combine anxiety,sleep and pain medications without talking with your primary care practitioner  Special Instructions: If you have smoked or chewed Tobacco  in the last 2 yrs please stop smoking, stop any regular Alcohol  and or any Recreational drug use.  Wear Seat belts while driving.  Do not drive if taking any narcotic, mind altering or controlled substances or recreational drugs or alcohol.       

## 2022-09-12 NOTE — Plan of Care (Signed)

## 2022-09-12 NOTE — Discharge Summary (Addendum)
Physician Discharge Summary  [name] Petersen JXB:[id number] DOB: 09/01/1926 DOA: 09/11/2022  Hem/Onc: Hezzie Bump GI: [facility]   Admit date: 09/11/2022 Discharge date: 09/12/2022  Admitted From:  [facility]  Disposition: [facility]   Recommendations for Outpatient Follow-up:  Follow up with Dr. Hezzie Bump in 1 weeks Follow up with [facility] in 2 weeks Please obtain CBC in 1 week to monitor Hg and please monitor CBC at least every 2 weeks x 3 months Please consider ordering outpatient palliative medicine consultation Please monitor blood pressure daily and adjust medication as needed for better BP control   Discharge Condition: STABLE   CODE STATUS: DNR, reviewed ACP documents DIET: resume previous diet    Brief Hospitalization Summary: Please see all hospital notes, images, labs for full details of the hospitalization. ADMISSION PROVIDER HPI:   89 year old female with stage Ib clinical right breast cancer 3 triple positive currently on palliative neoadjuvant endocrine therapy plus Herceptin, diverticulosis, history of rectal bleeding, cholelithiasis, benign-appearing hepatic and renal cysts, hyperlipidemia, chronic constipation, hypertension, macular degeneration, peripheral artery disease, history of stroke, diet-controlled diabetes mellitus type 2, peripheral neuropathy, vitamin D deficiency who is currently a long-term resident at [facility].   She presented to the ED by EMS from [facility] after experiencing near syncope on the toilet and complaints of dizziness and palpitation.  There was no loss of consciousness.  No chest pain or shortness of breath.  She was recently evaluated by GI for rectal bleeding but that has since resolved.   EMS noted that patient was in atrial fibrillation with RVR and it was confirmed on EKG done in the emergency department.  She had been on Plavix recently for cerebrovascular disease after CVA.  She was initially started on IV diltiazem in the  emergency department however she quickly converted to normal sinus rhythm and the diltiazem was discontinued.  She was started on IV heparin infusion and admission was requested.  HOSPITAL COURSE BY PROBLEM   Atrial fibrillation with RVR - Resolved - fortunately patient converted to sinus rhythm after brief time on IV diltiazem infusion - metoprolol 12.5 mg BID started for HR control - CHA2DS2-VASc score at least 4 or more - discussion with patient and daughter at bedside about risks of full anticoagulation, pt insists that rectal bleeding has stopped, they want to proceed with full anticoagulation and understand risks of severe bleeding event, etc. - pharm D consulted to dose apixaban  - monitored for bleeding complication  - monitor on cardiac telemetry  - TTE LVEF 60-65% grade 2 DD   - check TSH - 3.234 WNL    Essential hypertension  - starting metoprolol 12.5 mg BID  - monitor BP response  - resume home amlodipine     History of CVA  - DC plavix upon starting apixaban  - family reported does not want extra bleeding risk having patient on plavix and apixaban  - apixaban for stroke prevention    Type 2 diabetes mellitus with neuropathy - follow up A1c test  -  has been diet controlled per patient   Hyperlipidemia - resume home lipid lowering medication    Phantom limb pain s/p left BKA - resume home gabapentin medication    Right breast cancer - Stage IB triple positive - palliative herceptin and letrozole  - [facility]    Discharge Diagnoses:  Principal Problem:   Atrial fibrillation with RVR (HCC) Active Problems:   Atherosclerosis of native arteries of the extremities with  ulceration (HCC)   PAD (peripheral artery disease) (HCC)   S/P below knee amputation, left (HCC)   Essential hypertension   Stage 3 chronic kidney disease (HCC)   Type 2 diabetes mellitus with obesity (HCC)   Slow transit constipation   Phantom limb pain  (HCC)   Unilateral complete BKA, left, sequela (HCC)   Urinary retention   History of stroke   Hyperlipidemia   Discharge Instructions:  Allergies as of 09/12/2022       Reactions   Prednisone Other (See Comments)   Other Reaction(s): Fatique   Scopace [scopolamine] Other (See Comments)   Hallucinations        Medication List     STOP taking these medications    clopidogrel 75 MG tablet Commonly known as: PLAVIX       TAKE these medications    acetaminophen 325 MG tablet Commonly known as: TYLENOL Take 3 tablets (975 mg total) by mouth at bedtime as needed for mild pain. What changed:  how much to take when to take this   amLODipine 5 MG tablet Commonly known as: NORVASC Take 5 mg by mouth daily.   apixaban 5 MG Tabs tablet Commonly known as: ELIQUIS Take 1 tablet (5 mg total) by mouth 2 (two) times daily.   beta carotene w/minerals tablet Take 1 tablet by mouth daily.   multivitamin with minerals tablet Take 1 tablet by mouth daily.   Biofreeze 4 % Gel Generic drug: Menthol (Topical Analgesic) Apply topically as needed.   calcium carbonate 500 MG chewable tablet Commonly known as: TUMS - dosed in mg elemental calcium Chew 2 tablets by mouth as needed for indigestion or heartburn.   cyanocobalamin 1000 MCG tablet Take 1,000 mcg by mouth daily.   furosemide 20 MG tablet Commonly known as: LASIX Take 20 mg by mouth daily. Edema   gabapentin 100 MG capsule Commonly known as: NEURONTIN Take 100 mg by mouth daily.   hydrocortisone 2.5 % rectal cream Commonly known as: ANUSOL-HC Place 1 Application rectally 2 (two) times daily.   letrozole 2.5 MG tablet Commonly known as: FEMARA Take 2.5 mg by mouth daily.   lovastatin 20 MG tablet Commonly known as: MEVACOR Take 20 mg by mouth daily at 6 PM.   metoprolol tartrate 25 MG tablet Commonly known as: LOPRESSOR Take 0.5 tablets (12.5 mg total) by mouth 2 (two) times daily.   nystatin  ointment Commonly known as: MYCOSTATIN Apply 1 Application topically 2 (two) times daily.   phenylephrine-shark liver oil-mineral oil-petrolatum 0.25-3-14-71.9 % rectal ointment Commonly known as: PREPARATION H Place 1 Application rectally 2 (two) times daily. Apply internally for 10 days and then as needed.   trastuzumab 4 mg/kg in sodium chloride 0.9 % 250 mL Inject 4 mg/kg into the vein once.   Vitamin D3 50 MCG (2000 UT) capsule Take 2,000 Units by mouth daily.        Follow-up Information     Towana Badger, MD. Schedule an appointment as soon as possible for a visit in 1 week(s).   Specialty: Hematology and Oncology Why: Hospital Follow Up Contact information: [address] [place] [address] [phone]         Aida Raider, NP. Schedule an appointment as soon as possible for a visit in 2 week(s).   Specialty: Gastroenterology Why: Hospital Follow Up Contact information: [address] [place] [address] [phone]  Allergies  Allergen Reactions   Prednisone Other (See Comments)    Other Reaction(s): Fatique   Scopace [Scopolamine] Other (See Comments)    Hallucinations   Allergies as of 09/12/2022       Reactions   Prednisone Other (See Comments)   Other Reaction(s): Fatique   Scopace [scopolamine] Other (See Comments)   Hallucinations        Medication List     STOP taking these medications    clopidogrel 75 MG tablet Commonly known as: PLAVIX       TAKE these medications    acetaminophen 325 MG tablet Commonly known as: TYLENOL Take 3 tablets (975 mg total) by mouth at bedtime as needed for mild pain. What changed:  how much to take when to take this   amLODipine 5 MG tablet Commonly known as: NORVASC Take 5 mg by mouth daily.   apixaban 5 MG Tabs tablet Commonly known as: ELIQUIS Take 1 tablet (5 mg total) by mouth 2 (two) times daily.   beta carotene w/minerals tablet Take 1 tablet  by mouth daily.   multivitamin with minerals tablet Take 1 tablet by mouth daily.   Biofreeze 4 % Gel Generic drug: Menthol (Topical Analgesic) Apply topically as needed.   calcium carbonate 500 MG chewable tablet Commonly known as: TUMS - dosed in mg elemental calcium Chew 2 tablets by mouth as needed for indigestion or heartburn.   cyanocobalamin 1000 MCG tablet Take 1,000 mcg by mouth daily.   furosemide 20 MG tablet Commonly known as: LASIX Take 20 mg by mouth daily. Edema   gabapentin 100 MG capsule Commonly known as: NEURONTIN Take 100 mg by mouth daily.   hydrocortisone 2.5 % rectal cream Commonly known as: ANUSOL-HC Place 1 Application rectally 2 (two) times daily.   letrozole 2.5 MG tablet Commonly known as: FEMARA Take 2.5 mg by mouth daily.   lovastatin 20 MG tablet Commonly known as: MEVACOR Take 20 mg by mouth daily at 6 PM.   metoprolol tartrate 25 MG tablet Commonly known as: LOPRESSOR Take 0.5 tablets (12.5 mg total) by mouth 2 (two) times daily.   nystatin ointment Commonly known as: MYCOSTATIN Apply 1 Application topically 2 (two) times daily.   phenylephrine-shark liver oil-mineral oil-petrolatum 0.25-3-14-71.9 % rectal ointment Commonly known as: PREPARATION H Place 1 Application rectally 2 (two) times daily. Apply internally for 10 days and then as needed.   trastuzumab 4 mg/kg in sodium chloride 0.9 % 250 mL Inject 4 mg/kg into the vein once.   Vitamin D3 50 MCG (2000 UT) capsule Take 2,000 Units by mouth daily.        Procedures/Studies: ECHOCARDIOGRAM COMPLETE  Result Date: 09/11/2022    ECHOCARDIOGRAM REPORT   Patient Name:   [name] Petersen Date of Exam: 09/11/2022 Medical Rec #:  [id number]      Height:       63.0 in Accession #:    [id number]     Weight:       159.0 lb Date of Birth:  June 21, 1926      BSA:          1.754 m Patient Age:    89 years       BP:           150/76 mmHg Patient Gender: F              HR:           55 bpm.  [facility]  Procedure: 2D Echo, Cardiac Doppler and Color Doppler Indications:    Atrial fibrillation  History:        Patient has prior history of Echocardiogram examinations, most                 recent 07/14/2022. Stroke and PAD, Signs/Symptoms:Syncope; Risk                 Factors:Hypertension and Diabetes. CA.  Sonographer:    Milda Smart Referring Phys: [id number] ASIA B ZIERLE-GHOSH IMPRESSIONS  1. Left ventricular ejection fraction, by estimation, is 60 to 65%. The left ventricle has normal function. The left ventricle has no regional wall motion abnormalities. There is mild left ventricular hypertrophy. Left ventricular diastolic parameters are consistent with Grade II diastolic dysfunction (pseudonormalization). Elevated left atrial pressure.  2. Right ventricular systolic function is normal. The right ventricular size is normal.  3. The mitral valve is normal in structure. Mild mitral valve regurgitation. No evidence of mitral stenosis. Moderate mitral annular calcification.  4. The aortic valve is tricuspid. Aortic valve regurgitation is not visualized. Mild aortic valve stenosis.  5. The inferior vena cava is normal in size with greater than 50% respiratory variability, suggesting right atrial pressure of 3 mmHg. FINDINGS  Left Ventricle: Left ventricular ejection fraction, by estimation, is 60 to 65%. The left ventricle has normal function. The left ventricle has no regional wall motion abnormalities. The left ventricular internal cavity size was normal in size. There is  mild left ventricular hypertrophy. Left ventricular diastolic parameters are consistent with Grade II diastolic dysfunction (pseudonormalization). Elevated left atrial pressure. Right Ventricle: The right ventricular size is normal. Right ventricular systolic function is normal. Left Atrium: Left atrial size was normal in size. Right Atrium: Right atrial size was normal in size. Pericardium: There is no evidence of  pericardial effusion. Mitral Valve: The mitral valve is normal in structure. Moderate mitral annular calcification. Mild mitral valve regurgitation. No evidence of mitral valve stenosis. MV peak gradient, 6.5 mmHg. The mean mitral valve gradient is 2.0 mmHg. Tricuspid Valve: The tricuspid valve is normal in structure. Tricuspid valve regurgitation is mild . No evidence of tricuspid stenosis. Aortic Valve: The aortic valve is tricuspid. Aortic valve regurgitation is not visualized. Mild aortic stenosis is present. Aortic valve mean gradient measures 10.0 mmHg. Aortic valve peak gradient measures 14.7 mmHg. Aortic valve area, by VTI measures 1.43 cm. Pulmonic Valve: The pulmonic valve was normal in structure. Pulmonic valve regurgitation is not visualized. No evidence of pulmonic stenosis. Aorta: The aortic root is normal in size and structure. Venous: The inferior vena cava is normal in size with greater than 50% respiratory variability, suggesting right atrial pressure of 3 mmHg. IAS/Shunts: No atrial level shunt detected by color flow Doppler.  LEFT VENTRICLE PLAX 2D LVIDd:         4.00 cm   Diastology LVIDs:         2.90 cm   LV e' medial:    4.78 cm/s LV PW:         1.20 cm   LV E/e' medial:  27.6 LV IVS:        1.40 cm   LV e' lateral:   5.96 cm/s LVOT diam:     2.00 cm   LV E/e' lateral: 22.1 LV SV:         61 LV SV Index:   35 LVOT Area:     3.14 cm  RIGHT VENTRICLE  IVC RV S prime:     7.70 cm/s  IVC diam: 1.10 cm TAPSE (M-mode): 1.6 cm LEFT ATRIUM           Index        RIGHT ATRIUM           Index LA diam:      3.30 cm 1.88 cm/m   RA Area:     13.10 cm LA Vol (A2C): 34.1 ml 19.44 ml/m  RA Volume:   29.30 ml  16.71 ml/m LA Vol (A4C): 40.9 ml 23.32 ml/m  AORTIC VALVE AV Area (Vmax):    1.38 cm AV Area (Vmean):   1.30 cm AV Area (VTI):     1.43 cm AV Vmax:           192.00 cm/s AV Vmean:          152.000 cm/s AV VTI:            0.429 m AV Peak Grad:      14.7 mmHg AV Mean Grad:      10.0  mmHg LVOT Vmax:         84.20 cm/s LVOT Vmean:        62.700 cm/s LVOT VTI:          0.195 m LVOT/AV VTI ratio: 0.45  AORTA Ao Root diam: 3.00 cm Ao Asc diam:  3.30 cm MITRAL VALVE                TRICUSPID VALVE MV Area (PHT): 3.02 cm     TR Peak grad:   34.6 mmHg MV Area VTI:   1.58 cm     TR Mean grad:   24.0 mmHg MV Peak grad:  6.5 mmHg     TR Vmax:        294.00 cm/s MV Mean grad:  2.0 mmHg     TR Vmean:       234.0 cm/s MV Vmax:       1.27 m/s MV Vmean:      72.6 cm/s    SHUNTS MV Decel Time: 251 msec     Systemic VTI:  0.20 m MV E velocity: 132.00 cm/s  Systemic Diam: 2.00 cm MV A velocity: 72.20 cm/s MV E/A ratio:  1.83 Olga Millers MD Electronically signed by Olga Millers MD Signature Date/Time: 09/11/2022/4:09:32 PM    Final    DG Chest Port 1 View  Result Date: 09/11/2022 CLINICAL DATA:  Dizziness, new onset AFib. EXAM: PORTABLE CHEST 1 VIEW COMPARISON:  08/27/2020. FINDINGS: The heart is enlarged and the mediastinal contour is within normal limits. There is atherosclerotic calcification of the aorta. Lung volumes are low with mild atelectasis or scarring at the lung bases. No consolidation, effusion, or pneumothorax. No acute osseous abnormality. IMPRESSION: Low lung volumes with atelectasis or scarring at the lung bases. Electronically Signed   By: Thornell Sartorius M.D.   On: 09/11/2022 04:23   CT ABDOMEN PELVIS W CONTRAST  Result Date: 08/20/2022 CLINICAL DATA:  Right red blood per rectum for several weeks, abdominal pain EXAM: CT ABDOMEN AND PELVIS WITH CONTRAST TECHNIQUE: Multidetector CT imaging of the abdomen and pelvis was performed using the standard protocol following bolus administration of intravenous contrast. RADIATION DOSE REDUCTION: This exam was performed according to the departmental dose-optimization program which includes automated exposure control, adjustment of the mA and/or kV according to patient size and/or use of iterative reconstruction technique. CONTRAST:  80mL  OMNIPAQUE IOHEXOL 300 MG/ML  SOLN COMPARISON:  08/23/2020. FINDINGS: Lower chest: No acute pleural or parenchymal lung disease. Calcification of the mitral and aortic valves. Small hiatal hernia. Hepatobiliary: Multiple gallstones are identified without evidence of acute cholecystitis. There are numerous hepatic simple cysts. No biliary duct dilation or choledocholithiasis. Pancreas: There is a 1.5 x 2.4 x 1.8 cm simple appearing cyst off the inferior margin of the pancreatic body, reference image 33/2. No evidence of mural thickening or nodularity. Thin calcifications are seen within the wall the cyst on the coronal reconstructed images. No acute inflammatory changes within the pancreas. No pancreatic duct dilation. Spleen: Normal in size without focal abnormality. Adrenals/Urinary Tract: 1.4 cm left adrenal nodule is unchanged, with previous imaging characteristics consistent with adenoma. No specific follow-up is required given stability since 08/23/2020 exam. Right adrenal is unremarkable. There are numerous simple bilateral renal cysts which do not require specific imaging follow-up. 4 mm nonobstructing calculus lower pole right kidney. No left-sided calculi. No obstructive uropathy. The bladder is unremarkable. Stomach/Bowel: No bowel obstruction or ileus. Marked diverticulosis of the descending and sigmoid colon without evidence of acute diverticulitis. Normal appendix right lower quadrant. No bowel wall thickening or inflammatory changes. Evidence of pelvic floor laxity and rectal prolapse. Vascular/Lymphatic: Aortic atherosclerosis. No enlarged abdominal or pelvic lymph nodes. Reproductive: Status post hysterectomy. No adnexal masses. Other: No free fluid or free intraperitoneal gas. No abdominal wall hernia. Musculoskeletal: No acute or destructive bony lesions. Severe bilateral hip osteoarthritis. Reconstructed images demonstrate no additional findings. IMPRESSION: 1. Marked diverticulosis of the  descending and sigmoid colon without evidence of acute diverticulitis. 2. Pelvic floor laxity with evidence of rectal prolapse. 3. Nonobstructing 4 mm right renal calculus. 4. Cholelithiasis without cholecystitis. 5. Small hiatal hernia. 6. Simple appearing pancreatic cyst as above. No specific follow-up is recommended given patient age and relatively benign imaging characteristics. If further evaluation is clinically indicated, follow-up dedicated pancreatic MRI in 1 year could be considered. 7. Numerous benign hepatic and renal cysts, which do not require specific imaging follow-up. 8.  Aortic Atherosclerosis (ICD10-I70.0). Electronically Signed   By: Sharlet Salina M.D.   On: 08/20/2022 18:10     Subjective: Pt has been tolerating metoprolol and apixaban and no bleeding complications seen.    Discharge Exam: Vitals:   09/12/22 0432 09/12/22 0825  BP: (!) 166/65 (!) 185/79  Pulse: 65 (!) 58  Resp: 20 20  Temp: 98.2 F (36.8 C) 98.3 F (36.8 C)  SpO2: 99% 97%   Vitals:   09/11/22 1545 09/11/22 1945 09/12/22 0432 09/12/22 0825  BP: (!) 167/73 (!) 164/65 (!) 166/65 (!) 185/79  Pulse: (!) 56 60 65 (!) 58  Resp: 20 20 20 20   Temp: 98.2 F (36.8 C) 98.2 F (36.8 C) 98.2 F (36.8 C) 98.3 F (36.8 C)  TempSrc: Oral Oral Oral Oral  SpO2: 96% 96% 99% 97%  Weight:      Height:       General: Pt is alert, awake, not in acute distress Cardiovascular: normal S1/S2 +, no rubs, no gallops Respiratory: CTA bilaterally, no wheezing, no rhonchi Abdominal: Soft, NT, ND, bowel sounds + Extremities: no edema, no cyanosis   The results of significant diagnostics from this hospitalization (including imaging, microbiology, ancillary and laboratory) are listed below for reference.     Microbiology: No results found for this or any previous visit (from the past 240 hour(s)).   Labs: BNP (last 3 results) No results for input(s): "BNP" in the last 8760 hours. Basic Metabolic Panel: Recent Labs  Lab 09/11/22 0305 09/12/22 0521  NA 141 138  K 3.7 3.6  CL 100 101  CO2 28 24  GLUCOSE 179* 135*  BUN 18 22  CREATININE 1.35* 1.32*  CALCIUM 9.2 9.1  MG  --  2.0   Liver Function Tests: No results for input(s): "AST", "ALT", "ALKPHOS", "BILITOT", "PROT", "ALBUMIN" in the last 168 hours. No results for input(s): "LIPASE", "AMYLASE" in the last 168 hours. No results for input(s): "AMMONIA" in the last 168 hours. CBC: Recent Labs  Lab 09/11/22 0305 09/12/22 0521  WBC 7.4 7.0  HGB 14.4 13.5  HCT 44.5 42.4  MCV 89.9 91.4  PLT 246 222   Cardiac Enzymes: No results for input(s): "CKTOTAL", "CKMB", "CKMBINDEX", "TROPONINI" in the last 168 hours. BNP: Invalid input(s): "POCBNP" CBG: Recent Labs  Lab 09/11/22 0305  GLUCAP 181*   D-Dimer No results for input(s): "DDIMER" in the last 72 hours. Hgb A1c No results for input(s): "HGBA1C" in the last 72 hours. Lipid Profile No results for input(s): "CHOL", "HDL", "LDLCALC", "TRIG", "CHOLHDL", "LDLDIRECT" in the last 72 hours. Thyroid function studies Recent Labs    09/11/22 0811  TSH 3.234   Anemia work up No results for input(s): "VITAMINB12", "FOLATE", "FERRITIN", "TIBC", "IRON", "RETICCTPCT" in the last 72 hours. Urinalysis    Component Value Date/Time   COLORURINE YELLOW 09/11/2022 1555   APPEARANCEUR CLEAR 09/11/2022 1555   LABSPEC 1.017 09/11/2022 1555   PHURINE 5.0 09/11/2022 1555   GLUCOSEU NEGATIVE 09/11/2022 1555   HGBUR MODERATE (A) 09/11/2022 1555   BILIRUBINUR NEGATIVE 09/11/2022 1555   KETONESUR NEGATIVE 09/11/2022 1555   PROTEINUR 100 (A) 09/11/2022 1555   UROBILINOGEN 0.2 04/27/2014 1846   NITRITE NEGATIVE 09/11/2022 1555   LEUKOCYTESUR TRACE (A) 09/11/2022 1555   Sepsis Labs Recent Labs  Lab 09/11/22 0305 09/12/22 0521  WBC 7.4 7.0   Microbiology No results found for this or any previous visit (from the past 240 hour(s)).  Time coordinating discharge: 35 mins   SIGNED:  Standley Dakins, MD  [facility] 09/12/2022, 9:24 AM How to contact the [facility] [employer] Attending or Consulting provider 7A - 7P or covering provider during after hours 7P -7A, for this patient?  Check the care team in [facility] and look for a) attending/consulting [facility] provider listed and b) the [facility] team listed Log into [email or web address] and use [place]'s universal password to access. If you do not have the password, please contact the hospital operator. Locate the [facility] provider you are looking for under [facility] and page to a number that you can be directly reached. If you still have difficulty reaching the provider, please page the [facility] (Director on Call) for the Hospitalists listed on amion for assistance.

## 2022-09-12 NOTE — [place] FL2 (Signed)
[place] MEDICAID FL2 LEVEL OF CARE FORM     IDENTIFICATION  Patient Name: [name] Petersen Birthdate: 05-30-1926 Sex: female Admission Date (Current Location): 09/11/2022  [place] and [place] Number:  Aaron Edelman [id number] Facility and Address:  [facility],  [address] [place] [address]      Provider Number: [id number]  Attending Physician Name and Address:  Cleora Fleet, MD  Relative Name and Phone Number:  Vivien Presto (Daughter) [phone]    Current Level of Care: Hospital Recommended Level of Care: Assisted Living Facility Prior Approval Number:    Date Approved/Denied:   PASRR Number:    Discharge Plan:  [facility])    Current Diagnoses: Patient Active Problem List   Diagnosis Date Noted   Atrial fibrillation with RVR (HCC) 09/11/2022   History of stroke 08/21/2020   Hyperlipidemia 08/21/2020   Urinary retention    Phantom limb pain (HCC)    Unilateral complete BKA, left, sequela (HCC)    Slow transit constipation    Atherosclerotic PVD with ulceration (HCC) 07/14/2017   Pressure injury of right heel, stage 3 (HCC)    Sleep disturbance    Poor nutrition    Hypokalemia    Drug-induced constipation    Essential hypertension    Stage 3 chronic kidney disease (HCC)    Postoperative pain    Type 2 diabetes mellitus with obesity (HCC)    S/P below knee amputation, left (HCC) 07/10/2017   PAD (peripheral artery disease) (HCC) 07/06/2017   Atherosclerosis of native arteries of the extremities with ulceration (HCC) 06/16/2017    Orientation RESPIRATION BLADDER Height & Weight     Self, Situation, Place    Continent Weight: 158 lb 15.2 oz (72.1 kg) Height:  5\' 3"  (160 cm)  BEHAVIORAL SYMPTOMS/MOOD NEUROLOGICAL BOWEL NUTRITION STATUS      Continent Diet  AMBULATORY STATUS COMMUNICATION OF NEEDS Skin   Limited Assist Verbally Normal (Dry)                       Personal Care Assistance Level of Assistance  Dressing      Dressing Assistance: Limited assistance     Functional Limitations Info  Sight, Hearing Sight Info: Impaired Hearing Info: Adequate      SPECIAL CARE FACTORS FREQUENCY                       Contractures Contractures Info: Not present    Additional Factors Info  Code Status Code Status Info: DNR             Current Medications (09/12/2022):  This is the current hospital active medication list Current Facility-Administered Medications  Medication Dose Route Frequency Provider Last Rate Last Admin   acetaminophen (TYLENOL) tablet 650 mg  650 mg Oral Q6H PRN Johnson, Clanford L, MD       Or   acetaminophen (TYLENOL) suppository 650 mg  650 mg Rectal Q6H PRN Johnson, Clanford L, MD       amLODipine (NORVASC) tablet 5 mg  5 mg Oral Daily Johnson, Clanford L, MD   5 mg at 09/12/22 0920   apixaban (ELIQUIS) tablet 5 mg  5 mg Oral BID Johnson, Clanford L, MD   5 mg at 09/12/22 0919   bisacodyl (DULCOLAX) EC tablet 5 mg  5 mg Oral Daily PRN Johnson, Clanford L, MD       fentaNYL (SUBLIMAZE) injection 12.5 mcg  12.5 mcg Intravenous Q2H PRN Johnson, Clanford  L, MD       furosemide (LASIX) tablet 20 mg  20 mg Oral Daily Johnson, Clanford L, MD   20 mg at 09/12/22 0919   gabapentin (NEURONTIN) capsule 100 mg  100 mg Oral QHS Johnson, Clanford L, MD   100 mg at 09/11/22 2120   hydrALAZINE (APRESOLINE) tablet 25 mg  25 mg Oral Q8H PRN Johnson, Clanford L, MD   25 mg at 09/12/22 1042   hydrocortisone (ANUSOL-HC) 2.5 % rectal cream 1 Application  1 Application Rectal BID Laural Benes, Clanford L, MD       letrozole [facility]) tablet 2.5 mg  2.5 mg Oral Daily Johnson, Clanford L, MD   2.5 mg at 09/12/22 0920   metoprolol tartrate (LOPRESSOR) tablet 12.5 mg  12.5 mg Oral BID Johnson, Clanford L, MD   12.5 mg at 09/11/22 1610   nystatin ointment (MYCOSTATIN) 1 Application  1 Application Topical BID Laural Benes, Clanford L, MD   1 Application at 09/12/22 0925   ondansetron (ZOFRAN) tablet 4 mg  4 mg  Oral Q6H PRN Johnson, Clanford L, MD       Or   ondansetron (ZOFRAN) injection 4 mg  4 mg Intravenous Q6H PRN Johnson, Clanford L, MD       pravastatin (PRAVACHOL) tablet 40 mg  40 mg Oral q1800 Johnson, Clanford L, MD   40 mg at 09/11/22 1749     Discharge Medications: Please see discharge summary for a list of discharge medications.  Relevant Imaging Results:  Relevant Lab Results:   Additional Information [id number]  Catalina Gravel, LCSW

## 2022-09-12 NOTE — Progress Notes (Signed)
Spoke with provider in person about BP. Gave scheduled amlodipine and lasix.     09/12/22 0825  Vitals  Temp 98.3 F (36.8 C)  Temp Source Oral  BP (!) 185/79 (gave oral scheduled meds and notified provider)  MAP (mmHg) 110  BP Location Right Arm  BP Method Automatic  Patient Position (if appropriate) Lying  Pulse Rate (!) 58  Pulse Rate Source Monitor  Resp 20  MEWS COLOR  MEWS Score Color Green  Oxygen Therapy  SpO2 97 %  O2 Device Room Air  MEWS Score  MEWS Temp 0  MEWS Systolic 0  MEWS Pulse 0  MEWS RR 0  MEWS LOC 0  MEWS Score 0

## 2022-09-13 DIAGNOSIS — E1152 Type 2 diabetes mellitus with diabetic peripheral angiopathy with gangrene: Secondary | ICD-10-CM | POA: Diagnosis not present

## 2022-09-14 DIAGNOSIS — I959 Hypotension, unspecified: Secondary | ICD-10-CM | POA: Diagnosis not present

## 2022-09-14 DIAGNOSIS — I482 Chronic atrial fibrillation, unspecified: Secondary | ICD-10-CM | POA: Diagnosis not present

## 2022-09-14 DIAGNOSIS — K5900 Constipation, unspecified: Secondary | ICD-10-CM | POA: Diagnosis not present

## 2022-09-14 LAB — HEMOGLOBIN A1C
Hgb A1c MFr Bld: 7.8 % — ABNORMAL HIGH (ref 4.8–5.6)
Mean Plasma Glucose: 177 mg/dL

## 2022-09-17 DIAGNOSIS — R0602 Shortness of breath: Secondary | ICD-10-CM | POA: Diagnosis not present

## 2022-09-17 DIAGNOSIS — N390 Urinary tract infection, site not specified: Secondary | ICD-10-CM | POA: Diagnosis not present

## 2022-09-17 DIAGNOSIS — R0989 Other specified symptoms and signs involving the circulatory and respiratory systems: Secondary | ICD-10-CM | POA: Diagnosis not present

## 2022-09-20 DIAGNOSIS — E782 Mixed hyperlipidemia: Secondary | ICD-10-CM | POA: Diagnosis not present

## 2022-09-20 DIAGNOSIS — I480 Paroxysmal atrial fibrillation: Secondary | ICD-10-CM | POA: Diagnosis not present

## 2022-09-20 DIAGNOSIS — R0600 Dyspnea, unspecified: Secondary | ICD-10-CM | POA: Diagnosis not present

## 2022-09-20 DIAGNOSIS — I10 Essential (primary) hypertension: Secondary | ICD-10-CM | POA: Diagnosis not present

## 2022-09-20 DIAGNOSIS — I959 Hypotension, unspecified: Secondary | ICD-10-CM | POA: Diagnosis not present

## 2022-09-20 IMAGING — DX DG CHEST 1V PORT
1 series · 1 of 1 positions shown · non-contrast
Comparison: Portable exam 3991 hours compared to 08/24/2020

CLINICAL DATA: Shortness of breath, fever, history hypertension,
diabetes mellitus, pneumonia

EXAM:
PORTABLE CHEST 1 VIEW

[chest ap]
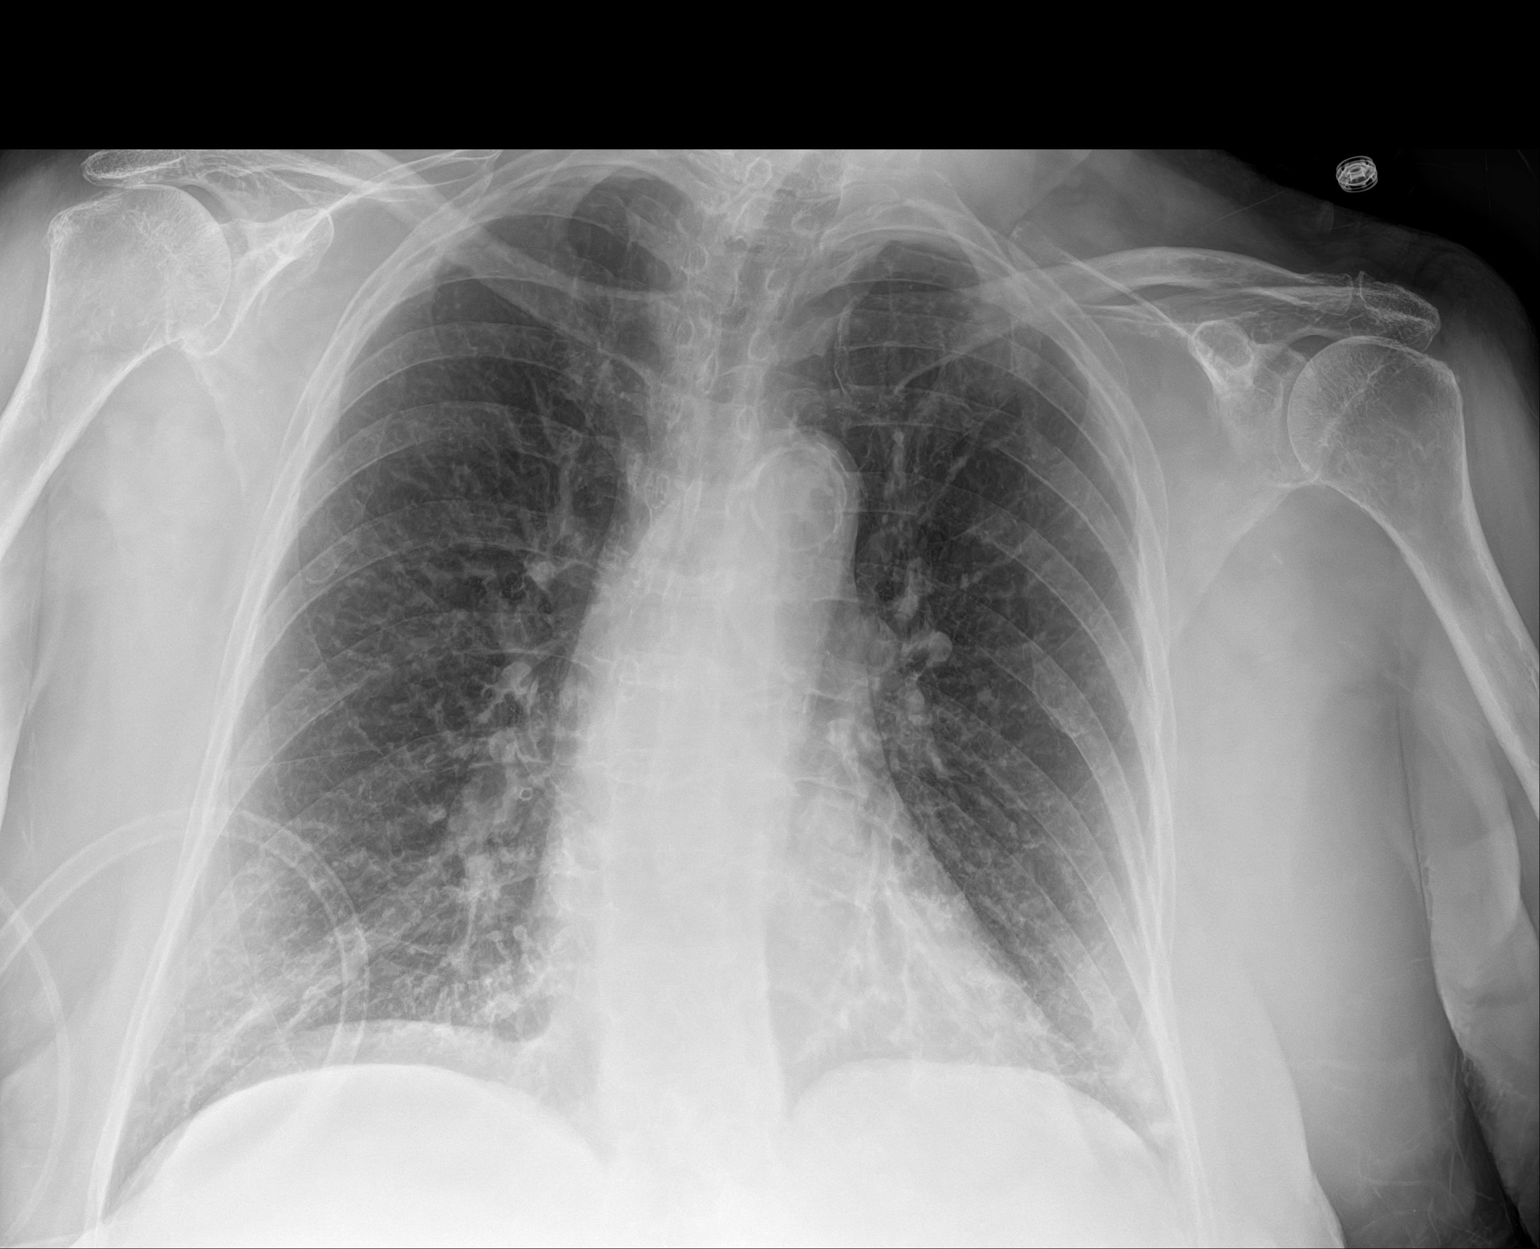

[1 of 1 positions shown; findings below may reference images not displayed]

FINDINGS: Normal heart size, mediastinal contours, and pulmonary vascularity.

Atherosclerotic calcification aorta.

Minimal bibasilar atelectasis.

Lungs otherwise clear.

No acute infiltrate, pleural effusion, or pneumothorax.

Bones demineralized.
IMPRESSION: Minimal bibasilar atelectasis.

Aortic Atherosclerosis ([id number]-[id number].H).

## 2022-09-21 IMAGING — DX DG CHEST 1V PORT
1 series · 1 of 1 positions shown · non-contrast
Comparison: 08/25/2020

CLINICAL DATA: Shortness of breath

EXAM:
PORTABLE CHEST 1 VIEW

[chest ap]
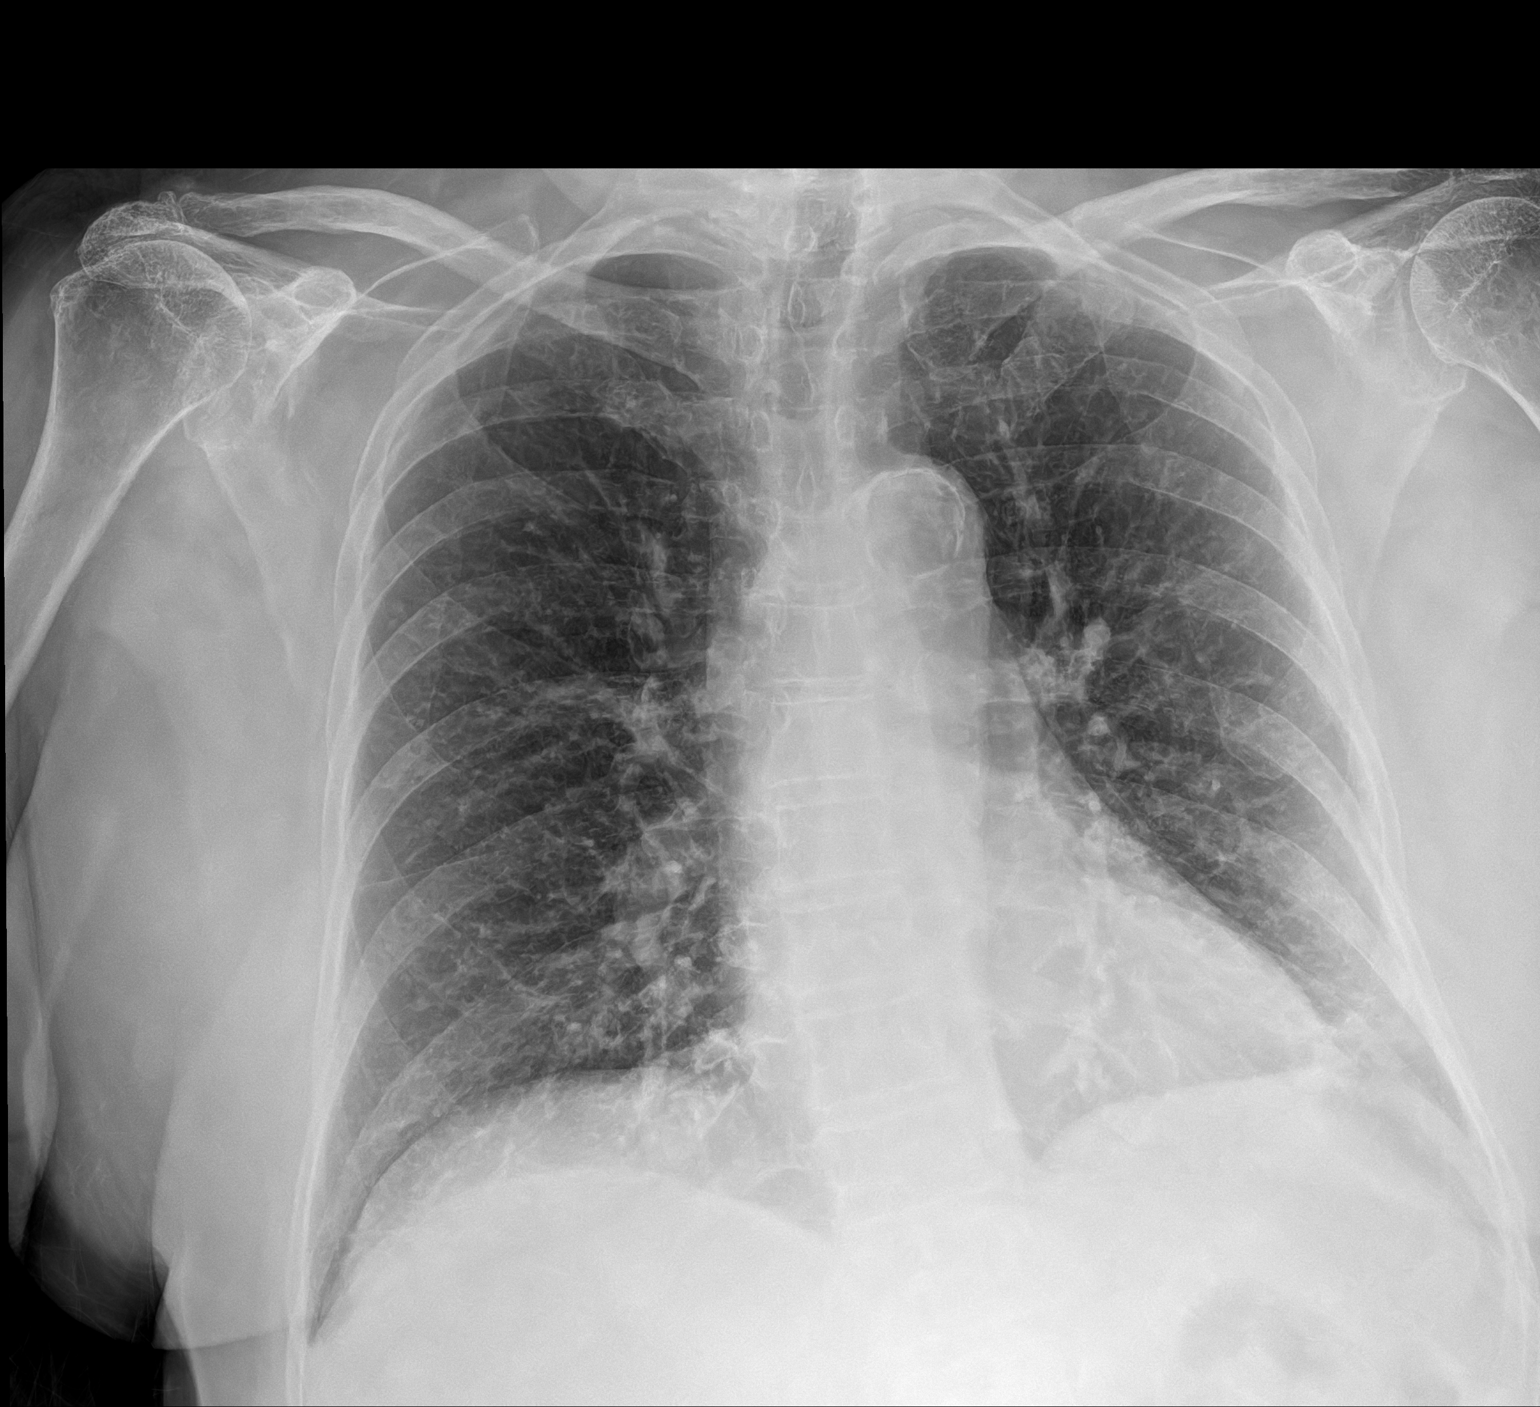

[1 of 1 positions shown; findings below may reference images not displayed]

FINDINGS: Heart is normal size. Aortic atherosclerosis. No confluent opacities
or effusions. No acute bony abnormality.
IMPRESSION: No active disease.

## 2022-09-22 IMAGING — DX DG CHEST 1V PORT
1 series · 1 of 1 positions shown · non-contrast
Comparison: 08/26/2020

CLINICAL DATA: Shortness of breath

EXAM:
PORTABLE CHEST 1 VIEW

[chest ap]
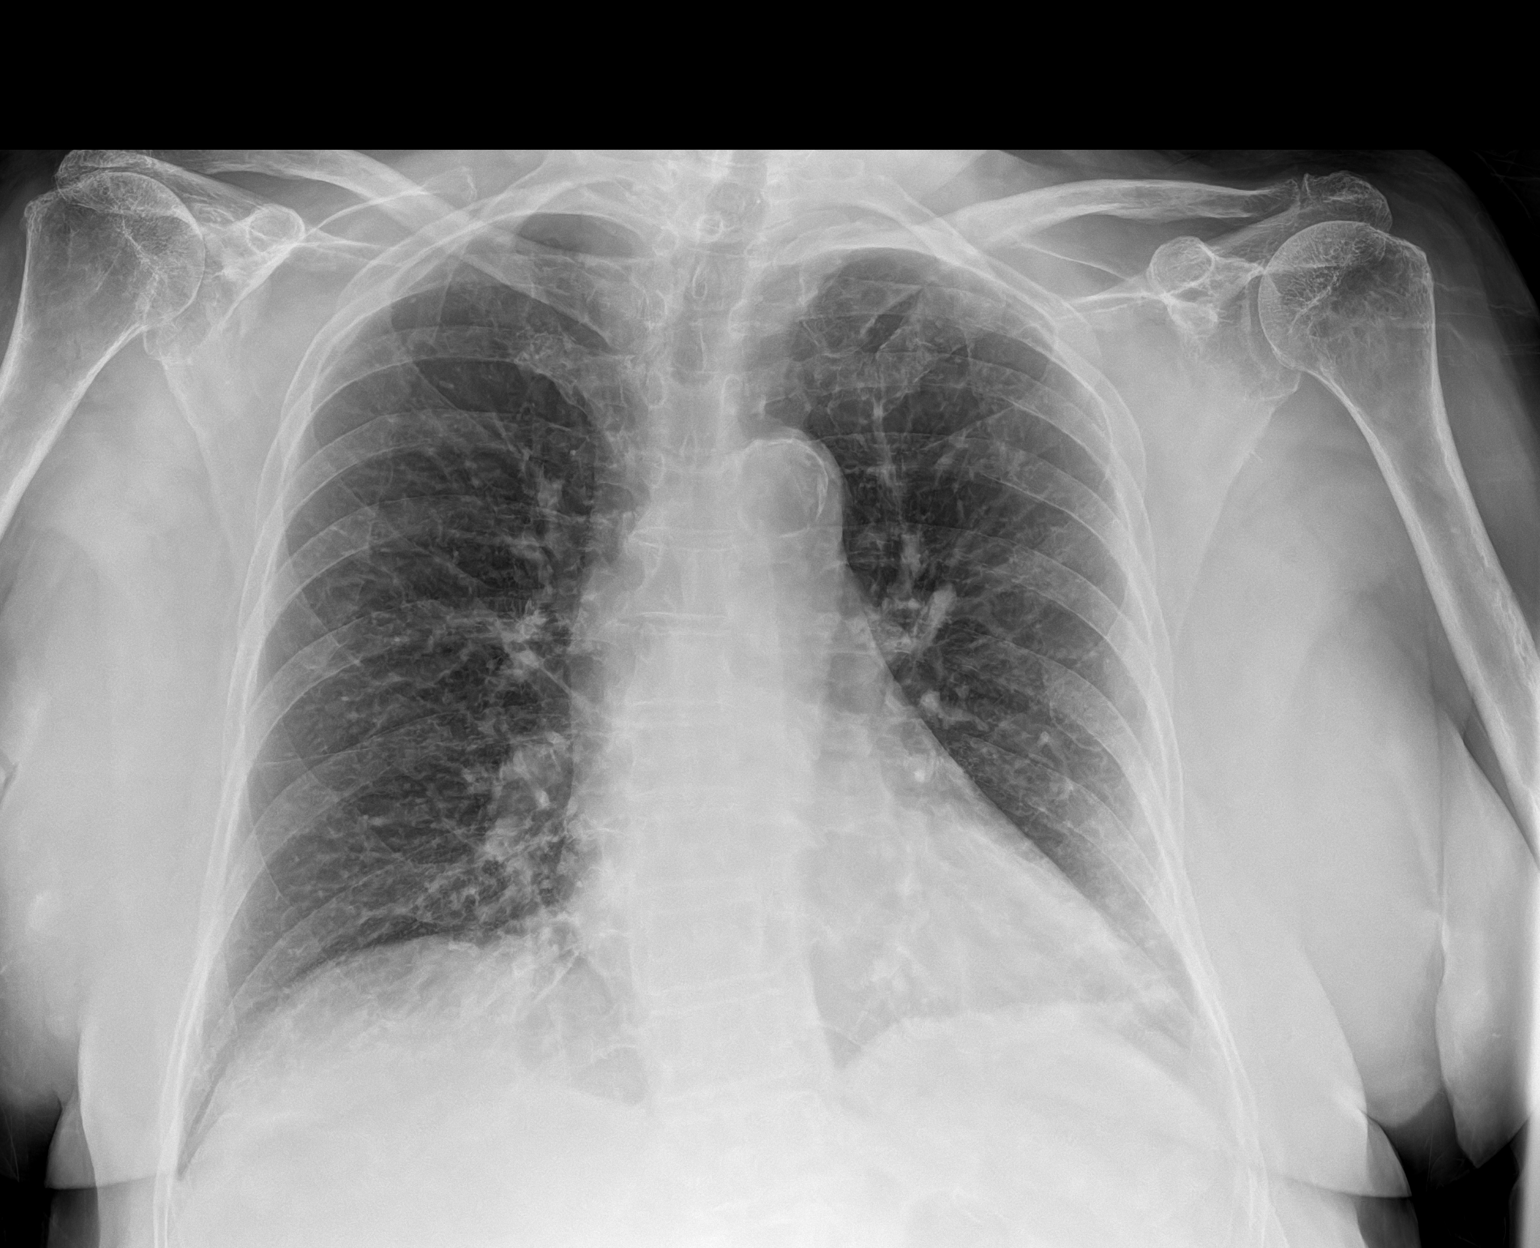

[1 of 1 positions shown; findings below may reference images not displayed]

FINDINGS: Increased markings in the lung bases could reflect atelectasis or
scarring. Heart is normal size. No effusions or pneumothorax. No
acute bony abnormality.
IMPRESSION: Bibasilar scarring or atelectasis.

## 2022-09-24 DIAGNOSIS — I10 Essential (primary) hypertension: Secondary | ICD-10-CM | POA: Diagnosis not present

## 2022-09-27 DIAGNOSIS — N390 Urinary tract infection, site not specified: Secondary | ICD-10-CM | POA: Diagnosis not present

## 2022-09-27 DIAGNOSIS — M79605 Pain in left leg: Secondary | ICD-10-CM | POA: Diagnosis not present

## 2022-10-04 DIAGNOSIS — S88112S Complete traumatic amputation at level between knee and ankle, left lower leg, sequela: Secondary | ICD-10-CM | POA: Diagnosis not present

## 2022-10-04 DIAGNOSIS — Z87440 Personal history of urinary (tract) infections: Secondary | ICD-10-CM | POA: Diagnosis not present

## 2022-10-04 DIAGNOSIS — R350 Frequency of micturition: Secondary | ICD-10-CM | POA: Diagnosis not present

## 2022-10-04 DIAGNOSIS — Z170 Estrogen receptor positive status [ER+]: Secondary | ICD-10-CM | POA: Diagnosis not present

## 2022-10-04 DIAGNOSIS — C50411 Malignant neoplasm of upper-outer quadrant of right female breast: Secondary | ICD-10-CM | POA: Diagnosis not present

## 2022-10-04 DIAGNOSIS — Z01810 Encounter for preprocedural cardiovascular examination: Secondary | ICD-10-CM | POA: Diagnosis not present

## 2022-10-04 DIAGNOSIS — G546 Phantom limb syndrome with pain: Secondary | ICD-10-CM | POA: Diagnosis not present

## 2022-10-05 ENCOUNTER — Ambulatory Visit: Payer: Medicare Other | Admitting: Gastroenterology

## 2022-10-08 DIAGNOSIS — R0989 Other specified symptoms and signs involving the circulatory and respiratory systems: Secondary | ICD-10-CM | POA: Diagnosis not present

## 2022-10-08 DIAGNOSIS — R0602 Shortness of breath: Secondary | ICD-10-CM | POA: Diagnosis not present

## 2022-10-11 DIAGNOSIS — R0602 Shortness of breath: Secondary | ICD-10-CM | POA: Diagnosis not present

## 2022-10-11 DIAGNOSIS — I119 Hypertensive heart disease without heart failure: Secondary | ICD-10-CM | POA: Diagnosis not present

## 2022-10-11 DIAGNOSIS — R5383 Other fatigue: Secondary | ICD-10-CM | POA: Diagnosis not present

## 2022-10-11 DIAGNOSIS — E1152 Type 2 diabetes mellitus with diabetic peripheral angiopathy with gangrene: Secondary | ICD-10-CM | POA: Diagnosis not present

## 2022-10-11 DIAGNOSIS — R42 Dizziness and giddiness: Secondary | ICD-10-CM | POA: Diagnosis not present

## 2022-10-11 DIAGNOSIS — M6281 Muscle weakness (generalized): Secondary | ICD-10-CM | POA: Diagnosis not present

## 2022-10-13 DIAGNOSIS — R42 Dizziness and giddiness: Secondary | ICD-10-CM | POA: Diagnosis not present

## 2022-10-13 DIAGNOSIS — R0602 Shortness of breath: Secondary | ICD-10-CM | POA: Diagnosis not present

## 2022-10-13 DIAGNOSIS — I119 Hypertensive heart disease without heart failure: Secondary | ICD-10-CM | POA: Diagnosis not present

## 2022-10-13 DIAGNOSIS — M6281 Muscle weakness (generalized): Secondary | ICD-10-CM | POA: Diagnosis not present

## 2022-10-13 DIAGNOSIS — R5383 Other fatigue: Secondary | ICD-10-CM | POA: Diagnosis not present

## 2022-10-15 DIAGNOSIS — I10 Essential (primary) hypertension: Secondary | ICD-10-CM | POA: Diagnosis not present

## 2022-10-15 DIAGNOSIS — C50919 Malignant neoplasm of unspecified site of unspecified female breast: Secondary | ICD-10-CM | POA: Diagnosis not present

## 2022-10-15 DIAGNOSIS — E7800 Pure hypercholesterolemia, unspecified: Secondary | ICD-10-CM | POA: Diagnosis not present

## 2022-10-15 DIAGNOSIS — Z89512 Acquired absence of left leg below knee: Secondary | ICD-10-CM | POA: Diagnosis not present

## 2022-10-15 DIAGNOSIS — R42 Dizziness and giddiness: Secondary | ICD-10-CM | POA: Diagnosis not present

## 2022-10-15 DIAGNOSIS — R5383 Other fatigue: Secondary | ICD-10-CM | POA: Diagnosis not present

## 2022-10-15 DIAGNOSIS — R0602 Shortness of breath: Secondary | ICD-10-CM | POA: Diagnosis not present

## 2022-10-15 DIAGNOSIS — M6281 Muscle weakness (generalized): Secondary | ICD-10-CM | POA: Diagnosis not present

## 2022-10-15 DIAGNOSIS — I119 Hypertensive heart disease without heart failure: Secondary | ICD-10-CM | POA: Diagnosis not present

## 2022-10-15 DIAGNOSIS — N183 Chronic kidney disease, stage 3 unspecified: Secondary | ICD-10-CM | POA: Diagnosis not present

## 2022-10-15 DIAGNOSIS — E119 Type 2 diabetes mellitus without complications: Secondary | ICD-10-CM | POA: Diagnosis not present

## 2022-10-18 DIAGNOSIS — I69391 Dysphagia following cerebral infarction: Secondary | ICD-10-CM | POA: Diagnosis not present

## 2022-10-18 DIAGNOSIS — N1832 Chronic kidney disease, stage 3b: Secondary | ICD-10-CM | POA: Diagnosis not present

## 2022-10-18 DIAGNOSIS — E1152 Type 2 diabetes mellitus with diabetic peripheral angiopathy with gangrene: Secondary | ICD-10-CM | POA: Diagnosis not present

## 2022-10-18 DIAGNOSIS — E1151 Type 2 diabetes mellitus with diabetic peripheral angiopathy without gangrene: Secondary | ICD-10-CM | POA: Diagnosis not present

## 2022-10-18 DIAGNOSIS — E1122 Type 2 diabetes mellitus with diabetic chronic kidney disease: Secondary | ICD-10-CM | POA: Diagnosis not present

## 2022-10-18 DIAGNOSIS — I1310 Hypertensive heart and chronic kidney disease without heart failure, with stage 1 through stage 4 chronic kidney disease, or unspecified chronic kidney disease: Secondary | ICD-10-CM | POA: Diagnosis not present

## 2022-10-22 DIAGNOSIS — N1832 Chronic kidney disease, stage 3b: Secondary | ICD-10-CM | POA: Diagnosis not present

## 2022-10-22 DIAGNOSIS — E1122 Type 2 diabetes mellitus with diabetic chronic kidney disease: Secondary | ICD-10-CM | POA: Diagnosis not present

## 2022-10-22 DIAGNOSIS — E1152 Type 2 diabetes mellitus with diabetic peripheral angiopathy with gangrene: Secondary | ICD-10-CM | POA: Diagnosis not present

## 2022-10-22 DIAGNOSIS — E1151 Type 2 diabetes mellitus with diabetic peripheral angiopathy without gangrene: Secondary | ICD-10-CM | POA: Diagnosis not present

## 2022-10-22 DIAGNOSIS — I69391 Dysphagia following cerebral infarction: Secondary | ICD-10-CM | POA: Diagnosis not present

## 2022-10-22 DIAGNOSIS — I1310 Hypertensive heart and chronic kidney disease without heart failure, with stage 1 through stage 4 chronic kidney disease, or unspecified chronic kidney disease: Secondary | ICD-10-CM | POA: Diagnosis not present

## 2022-10-26 DIAGNOSIS — E1122 Type 2 diabetes mellitus with diabetic chronic kidney disease: Secondary | ICD-10-CM | POA: Diagnosis not present

## 2022-10-26 DIAGNOSIS — E1151 Type 2 diabetes mellitus with diabetic peripheral angiopathy without gangrene: Secondary | ICD-10-CM | POA: Diagnosis not present

## 2022-10-26 DIAGNOSIS — E1152 Type 2 diabetes mellitus with diabetic peripheral angiopathy with gangrene: Secondary | ICD-10-CM | POA: Diagnosis not present

## 2022-10-26 DIAGNOSIS — I1310 Hypertensive heart and chronic kidney disease without heart failure, with stage 1 through stage 4 chronic kidney disease, or unspecified chronic kidney disease: Secondary | ICD-10-CM | POA: Diagnosis not present

## 2022-10-26 DIAGNOSIS — I69391 Dysphagia following cerebral infarction: Secondary | ICD-10-CM | POA: Diagnosis not present

## 2022-10-26 DIAGNOSIS — N1832 Chronic kidney disease, stage 3b: Secondary | ICD-10-CM | POA: Diagnosis not present

## 2022-10-27 DIAGNOSIS — E1122 Type 2 diabetes mellitus with diabetic chronic kidney disease: Secondary | ICD-10-CM | POA: Diagnosis not present

## 2022-10-27 DIAGNOSIS — E1151 Type 2 diabetes mellitus with diabetic peripheral angiopathy without gangrene: Secondary | ICD-10-CM | POA: Diagnosis not present

## 2022-10-27 DIAGNOSIS — I1310 Hypertensive heart and chronic kidney disease without heart failure, with stage 1 through stage 4 chronic kidney disease, or unspecified chronic kidney disease: Secondary | ICD-10-CM | POA: Diagnosis not present

## 2022-10-27 DIAGNOSIS — E1152 Type 2 diabetes mellitus with diabetic peripheral angiopathy with gangrene: Secondary | ICD-10-CM | POA: Diagnosis not present

## 2022-10-27 DIAGNOSIS — I69391 Dysphagia following cerebral infarction: Secondary | ICD-10-CM | POA: Diagnosis not present

## 2022-10-27 DIAGNOSIS — N1832 Chronic kidney disease, stage 3b: Secondary | ICD-10-CM | POA: Diagnosis not present

## 2022-11-02 DIAGNOSIS — I69391 Dysphagia following cerebral infarction: Secondary | ICD-10-CM | POA: Diagnosis not present

## 2022-11-02 DIAGNOSIS — E1152 Type 2 diabetes mellitus with diabetic peripheral angiopathy with gangrene: Secondary | ICD-10-CM | POA: Diagnosis not present

## 2022-11-02 DIAGNOSIS — I1310 Hypertensive heart and chronic kidney disease without heart failure, with stage 1 through stage 4 chronic kidney disease, or unspecified chronic kidney disease: Secondary | ICD-10-CM | POA: Diagnosis not present

## 2022-11-02 DIAGNOSIS — E1151 Type 2 diabetes mellitus with diabetic peripheral angiopathy without gangrene: Secondary | ICD-10-CM | POA: Diagnosis not present

## 2022-11-02 DIAGNOSIS — N1832 Chronic kidney disease, stage 3b: Secondary | ICD-10-CM | POA: Diagnosis not present

## 2022-11-02 DIAGNOSIS — E1122 Type 2 diabetes mellitus with diabetic chronic kidney disease: Secondary | ICD-10-CM | POA: Diagnosis not present

## 2022-11-04 DIAGNOSIS — Z170 Estrogen receptor positive status [ER+]: Secondary | ICD-10-CM | POA: Diagnosis not present

## 2022-11-04 DIAGNOSIS — I4891 Unspecified atrial fibrillation: Secondary | ICD-10-CM | POA: Diagnosis not present

## 2022-11-04 DIAGNOSIS — E1152 Type 2 diabetes mellitus with diabetic peripheral angiopathy with gangrene: Secondary | ICD-10-CM | POA: Diagnosis not present

## 2022-11-04 DIAGNOSIS — I1310 Hypertensive heart and chronic kidney disease without heart failure, with stage 1 through stage 4 chronic kidney disease, or unspecified chronic kidney disease: Secondary | ICD-10-CM | POA: Diagnosis not present

## 2022-11-04 DIAGNOSIS — I69391 Dysphagia following cerebral infarction: Secondary | ICD-10-CM | POA: Diagnosis not present

## 2022-11-04 DIAGNOSIS — E1122 Type 2 diabetes mellitus with diabetic chronic kidney disease: Secondary | ICD-10-CM | POA: Diagnosis not present

## 2022-11-04 DIAGNOSIS — N1832 Chronic kidney disease, stage 3b: Secondary | ICD-10-CM | POA: Diagnosis not present

## 2022-11-04 DIAGNOSIS — E1151 Type 2 diabetes mellitus with diabetic peripheral angiopathy without gangrene: Secondary | ICD-10-CM | POA: Diagnosis not present

## 2022-11-04 DIAGNOSIS — C50411 Malignant neoplasm of upper-outer quadrant of right female breast: Secondary | ICD-10-CM | POA: Diagnosis not present

## 2022-11-09 ENCOUNTER — Encounter (HOSPITAL_COMMUNITY): Payer: Self-pay

## 2022-11-09 ENCOUNTER — Other Ambulatory Visit: Payer: Self-pay

## 2022-11-09 ENCOUNTER — Emergency Department (HOSPITAL_COMMUNITY): Payer: Medicare Other

## 2022-11-09 ENCOUNTER — Observation Stay (HOSPITAL_COMMUNITY)
Admission: EM | Admit: 2022-11-09 | Discharge: 2022-11-10 | Disposition: A | Discharge status: Skilled Nursing Facility | Payer: Medicare Other | Attending: Family Medicine | Admitting: Family Medicine

## 2022-11-09 DIAGNOSIS — Z8673 Personal history of transient ischemic attack (TIA), and cerebral infarction without residual deficits: Secondary | ICD-10-CM | POA: Diagnosis not present

## 2022-11-09 DIAGNOSIS — Z7901 Long term (current) use of anticoagulants: Secondary | ICD-10-CM | POA: Diagnosis not present

## 2022-11-09 DIAGNOSIS — I10 Essential (primary) hypertension: Secondary | ICD-10-CM | POA: Diagnosis present

## 2022-11-09 DIAGNOSIS — R0602 Shortness of breath: Secondary | ICD-10-CM | POA: Diagnosis not present

## 2022-11-09 DIAGNOSIS — I130 Hypertensive heart and chronic kidney disease with heart failure and stage 1 through stage 4 chronic kidney disease, or unspecified chronic kidney disease: Secondary | ICD-10-CM | POA: Insufficient documentation

## 2022-11-09 DIAGNOSIS — I5031 Acute diastolic (congestive) heart failure: Secondary | ICD-10-CM | POA: Diagnosis present

## 2022-11-09 DIAGNOSIS — I4892 Unspecified atrial flutter: Secondary | ICD-10-CM | POA: Diagnosis not present

## 2022-11-09 DIAGNOSIS — E1151 Type 2 diabetes mellitus with diabetic peripheral angiopathy without gangrene: Secondary | ICD-10-CM | POA: Diagnosis not present

## 2022-11-09 DIAGNOSIS — Z853 Personal history of malignant neoplasm of breast: Secondary | ICD-10-CM | POA: Diagnosis not present

## 2022-11-09 DIAGNOSIS — I4891 Unspecified atrial fibrillation: Principal | ICD-10-CM | POA: Diagnosis present

## 2022-11-09 DIAGNOSIS — R918 Other nonspecific abnormal finding of lung field: Secondary | ICD-10-CM | POA: Diagnosis not present

## 2022-11-09 DIAGNOSIS — Z043 Encounter for examination and observation following other accident: Secondary | ICD-10-CM | POA: Diagnosis not present

## 2022-11-09 DIAGNOSIS — N1832 Chronic kidney disease, stage 3b: Secondary | ICD-10-CM | POA: Diagnosis not present

## 2022-11-09 DIAGNOSIS — E1122 Type 2 diabetes mellitus with diabetic chronic kidney disease: Secondary | ICD-10-CM | POA: Insufficient documentation

## 2022-11-09 DIAGNOSIS — N183 Chronic kidney disease, stage 3 unspecified: Secondary | ICD-10-CM | POA: Diagnosis not present

## 2022-11-09 DIAGNOSIS — I1310 Hypertensive heart and chronic kidney disease without heart failure, with stage 1 through stage 4 chronic kidney disease, or unspecified chronic kidney disease: Secondary | ICD-10-CM | POA: Diagnosis not present

## 2022-11-09 DIAGNOSIS — I69391 Dysphagia following cerebral infarction: Secondary | ICD-10-CM | POA: Diagnosis not present

## 2022-11-09 DIAGNOSIS — Z79899 Other long term (current) drug therapy: Secondary | ICD-10-CM | POA: Insufficient documentation

## 2022-11-09 DIAGNOSIS — E1152 Type 2 diabetes mellitus with diabetic peripheral angiopathy with gangrene: Secondary | ICD-10-CM | POA: Diagnosis not present

## 2022-11-09 DIAGNOSIS — I499 Cardiac arrhythmia, unspecified: Secondary | ICD-10-CM | POA: Diagnosis not present

## 2022-11-09 DIAGNOSIS — I517 Cardiomegaly: Secondary | ICD-10-CM | POA: Diagnosis not present

## 2022-11-09 DIAGNOSIS — Z743 Need for continuous supervision: Secondary | ICD-10-CM | POA: Diagnosis not present

## 2022-11-09 DIAGNOSIS — R000 Tachycardia, unspecified: Secondary | ICD-10-CM | POA: Diagnosis not present

## 2022-11-09 LAB — COMPREHENSIVE METABOLIC PANEL
ALT: 13 U/L (ref 0–44)
AST: 19 U/L (ref 15–41)
Albumin: 3.2 g/dL — ABNORMAL LOW (ref 3.5–5.0)
Alkaline Phosphatase: 69 U/L (ref 38–126)
Anion gap: 9 (ref 5–15)
BUN: 13 mg/dL (ref 8–23)
CO2: 25 mmol/L (ref 22–32)
Calcium: 8.6 mg/dL — ABNORMAL LOW (ref 8.9–10.3)
Chloride: 103 mmol/L (ref 98–111)
Creatinine, Ser: 1.11 mg/dL — ABNORMAL HIGH (ref 0.44–1.00)
GFR, Estimated: 46 mL/min — ABNORMAL LOW (ref >60)
Glucose, Bld: 138 mg/dL — ABNORMAL HIGH (ref 70–99)
Potassium: 3.8 mmol/L (ref 3.5–5.1)
Sodium: 137 mmol/L (ref 135–145)
Total Bilirubin: 0.6 mg/dL (ref 0.3–1.2)
Total Protein: 6 g/dL — ABNORMAL LOW (ref 6.5–8.1)

## 2022-11-09 LAB — CBC WITH DIFFERENTIAL/PLATELET
Abs Immature Granulocytes: 0.02 10*3/uL (ref 0.00–0.07)
Basophils Absolute: 0 10*3/uL (ref 0.0–0.1)
Basophils Relative: 0 %
Eosinophils Absolute: 0.1 10*3/uL (ref 0.0–0.5)
Eosinophils Relative: 2 %
HCT: 41.7 % (ref 36.0–46.0)
Hemoglobin: 13.1 g/dL (ref 12.0–15.0)
Immature Granulocytes: 0 %
Lymphocytes Relative: 19 %
Lymphs Abs: 1.4 10*3/uL (ref 0.7–4.0)
MCH: 28.7 pg (ref 26.0–34.0)
MCHC: 31.4 g/dL (ref 30.0–36.0)
MCV: 91.4 fL (ref 80.0–100.0)
Monocytes Absolute: 0.8 10*3/uL (ref 0.1–1.0)
Monocytes Relative: 10 %
Neutro Abs: 5.2 10*3/uL (ref 1.7–7.7)
Neutrophils Relative %: 69 %
Platelets: 211 10*3/uL (ref 150–400)
RBC: 4.56 MIL/uL (ref 3.87–5.11)
RDW: 14.8 % (ref 11.5–15.5)
WBC: 7.5 10*3/uL (ref 4.0–10.5)
nRBC: 0 % (ref 0.0–0.2)

## 2022-11-09 LAB — BRAIN NATRIURETIC PEPTIDE: B Natriuretic Peptide: 361 pg/mL — ABNORMAL HIGH (ref 0.0–100.0)

## 2022-11-09 LAB — APTT: aPTT: 31 seconds (ref 24–36)

## 2022-11-09 MED ORDER — DILTIAZEM HCL-DEXTROSE 125-5 MG/125ML-% IV SOLN (PREMIX)
5.0000 mg/h | INTRAVENOUS | Status: DC
  Administered 2022-11-09: 5 mg/h via INTRAVENOUS
  Administered 2022-11-09: 12.5 mg/h via INTRAVENOUS
  Filled 2022-11-09 (×2): qty 125

## 2022-11-09 MED ORDER — ACETAMINOPHEN 650 MG RE SUPP: 650.0000 mg | Freq: Four times a day (QID) | RECTAL | Status: DC | PRN

## 2022-11-09 MED ORDER — DILTIAZEM LOAD VIA INFUSION
10.0000 mg | Freq: Once | INTRAVENOUS | Status: AC
  Administered 2022-11-09: 10 mg via INTRAVENOUS
  Filled 2022-11-09: qty 10

## 2022-11-09 MED ORDER — METOPROLOL TARTRATE 25 MG PO TABS
25.0000 mg | ORAL_TABLET | Freq: Two times a day (BID) | ORAL | Status: DC
  Administered 2022-11-09 – 2022-11-10 (×2): 25 mg via ORAL
  Filled 2022-11-09 (×2): qty 1

## 2022-11-09 MED ORDER — CHLORHEXIDINE GLUCONATE CLOTH 2 % EX PADS
6.0000 | MEDICATED_PAD | Freq: Every day | CUTANEOUS | Status: DC
  Administered 2022-11-09 – 2022-11-10 (×2): 6 via TOPICAL

## 2022-11-09 MED ORDER — ORAL CARE MOUTH RINSE: 15.0000 mL | OROMUCOSAL | Status: DC | PRN

## 2022-11-09 MED ORDER — LETROZOLE 2.5 MG PO TABS
2.5000 mg | ORAL_TABLET | Freq: Every day | ORAL | Status: DC
  Administered 2022-11-10: 2.5 mg via ORAL
  Filled 2022-11-09 (×3): qty 1

## 2022-11-09 MED ORDER — APIXABAN 5 MG PO TABS
5.0000 mg | ORAL_TABLET | Freq: Two times a day (BID) | ORAL | Status: DC
  Administered 2022-11-09 – 2022-11-10 (×2): 5 mg via ORAL
  Filled 2022-11-09 (×2): qty 1

## 2022-11-09 MED ORDER — FUROSEMIDE 10 MG/ML IJ SOLN
20.0000 mg | Freq: Two times a day (BID) | INTRAMUSCULAR | Status: DC
  Administered 2022-11-09 – 2022-11-10 (×2): 20 mg via INTRAVENOUS
  Filled 2022-11-09 (×2): qty 2

## 2022-11-09 MED ORDER — ONDANSETRON HCL 4 MG/2ML IJ SOLN: 4.0000 mg | Freq: Four times a day (QID) | INTRAMUSCULAR | Status: DC | PRN

## 2022-11-09 MED ORDER — ACETAMINOPHEN 325 MG PO TABS: 650.0000 mg | ORAL_TABLET | Freq: Four times a day (QID) | ORAL | Status: DC | PRN

## 2022-11-09 MED ORDER — ONDANSETRON HCL 4 MG PO TABS: 4.0000 mg | ORAL_TABLET | Freq: Four times a day (QID) | ORAL | Status: DC | PRN

## 2022-11-09 MED ORDER — POLYETHYLENE GLYCOL 3350 17 G PO PACK: 17.0000 g | PACK | Freq: Every day | ORAL | Status: DC | PRN

## 2022-11-09 NOTE — Assessment & Plan Note (Addendum)
Heart rate up to 146.  Recent hospitalization 08/2022 with new diagnosis of atrial fibrillation, converted to sinus after being on Cardizem drip briefly.Marland Kitchen  She was discharged on metoprolol 12.5 and Eliquis.  Recent echo with EF of 60 to 65%, G2DD. -10 mg Cardizem bolus given, now on Cardizem drip continue -Resume metoprolol at increased dose 25 mg twice daily

## 2022-11-09 NOTE — H&P (Signed)
History and Physical    [name] Petersen:[id number] DOB: October 16, 1926 DOA: 11/09/2022  PCP: Pcp, No   Patient coming from: [facility]  I have personally briefly reviewed patient's old medical records in [facility]  Chief Complaint: Difficulty breathing   HPI: [name] Petersen is a 89 y.o. female with medical history significant for, stroke, peripheral artery disease, diabetes mellitus, left BKA. Patient presented to the ED with complaints of difficulty breathing that started last night.  At time of my evaluation, patient is awake alert oriented and able to answer questions, daughter [name] is at bedside.  Patient denies chest pain, no right lower extremity swelling.  Recent hospitalization 5/25 to 5/26-admitted with atrial fibrillation with RVR, patient converted to sinus rhythm after being briefly on Cardizem drip.  Started on metoprolol, and discharged on also Eliquis.  ED Course: Tmax 98.4.  Tachycardic up to 146, respiratory 18- 34.  Systolic blood pressure 120s to 140s.  O2 sats 90 to 94% on room air.  EKG showing atrial fibrillation rate 131. BNP 361.  No prior to compare. Chest x-ray-bibasilar pulmonary edema or atelectasis plus pleural effusion. Left shoulder x-ray negative. Cardizem 10 mg given started on Cardizem drip.  Review of Systems: As per HPI all other systems reviewed and negative.  Past Medical History:  Diagnosis Date   Arthritis    hands   Atherosclerotic PVD with ulceration (HCC) 07/15/2017   RIGHT HEEL   Breast cancer (HCC)    Cancer (HCC)    Complication of anesthesia    lost eyesight for a few days after surgery in the 1950's   Constipation    Diabetes mellitus without complication (HCC)    no medications    Hypertension    Macular degeneration, bilateral    "one eye wet; one eye dry; she gets shots" (07/07/2017)   PAD (peripheral artery disease) (HCC)    Pneumonia    Stroke (HCC)    some trouble swallowing    Past Surgical History:   Procedure Laterality Date   ABDOMINAL AORTOGRAM W/LOWER EXTREMITY N/A 06/16/2017   Procedure: ABDOMINAL AORTOGRAM W/LOWER EXTREMITY;  Surgeon: Fransisco Hertz, MD;  Location: [facility] INVASIVE CV LAB;  Service: Cardiovascular;  Laterality: N/A;  lt extermity   ABDOMINAL HYSTERECTOMY     AMPUTATION Left 07/06/2017   Procedure: AMPUTATION BELOW KNEE LEFT;  Surgeon: Larina Earthly, MD;  Location: [facility] OR;  Service: Vascular;  Laterality: Left;   CATARACT EXTRACTION W/ INTRAOCULAR LENS  IMPLANT, BILATERAL     DILATION AND CURETTAGE OF UTERUS     LOWER EXTREMITY ANGIOGRAPHY N/A 07/14/2017   Procedure: LOWER EXTREMITY ANGIOGRAPHY;  Surgeon: Chuck Hint, MD;  Location[facility] INVASIVE CV LAB;  Service: Cardiovascular;  Laterality: N/A;     reports that she has never smoked. She has never used smokeless tobacco. She reports that she does not drink alcohol and does not use drugs.  Allergies  Allergen Reactions   Prednisone Other (See Comments)    Other Reaction(s): Fatique   Scopace [Scopolamine] Other (See Comments)    Hallucinations    Family History  Adopted: Yes   Prior to Admission medications   Medication Sig Start Date End Date Taking? Authorizing Provider  acetaminophen (TYLENOL) 325 MG tablet Take 3 tablets (975 mg total) by mouth at bedtime as needed for mild pain. 09/12/22   Johnson, Clanford L, MD  amLODipine (NORVASC) 5 MG tablet Take 5 mg by mouth daily.    [provider]  apixaban (ELIQUIS) 5 MG TABS tablet Take 1 tablet (5 mg total) by mouth 2 (two) times daily. 09/12/22   Johnson, Clanford L, MD  beta carotene w/minerals (OCUVITE) tablet Take 1 tablet by mouth daily.    [provider]  calcium carbonate (TUMS - DOSED IN MG ELEMENTAL CALCIUM) 500 MG chewable tablet Chew 2 tablets by mouth as needed for indigestion or heartburn.    [provider]  Cholecalciferol (VITAMIN D3) 50 MCG (2000 UT) capsule Take 2,000 Units by mouth daily.    [provider]  cyanocobalamin 1000 MCG tablet Take 1,000 mcg by mouth daily.    [provider]  furosemide (LASIX) 20 MG tablet Take 20 mg by mouth daily. Edema    [provider]  gabapentin (NEURONTIN) 100 MG capsule Take 100 mg by mouth daily.    [provider]  hydrocortisone (ANUSOL-HC) 2.5 % rectal cream Place 1 Application rectally 2 (two) times daily.    [provider]  letrozole (FEMARA) 2.5 MG tablet Take 2.5 mg by mouth daily.    [provider]  lovastatin (MEVACOR) 20 MG tablet Take 20 mg by mouth daily at 6 PM. 11/30/18   [provider]  Menthol, Topical Analgesic, (BIOFREEZE) 4 % GEL Apply topically as needed.    [provider]  metoprolol tartrate (LOPRESSOR) 25 MG tablet Take 0.5 tablets (12.5 mg total) by mouth 2 (two) times daily. 09/12/22   Johnson, Clanford L, MD  Multiple Vitamins-Minerals (MULTIVITAMIN WITH MINERALS) tablet Take 1 tablet by mouth daily.    [provider]  nystatin ointment (MYCOSTATIN) Apply 1 Application topically 2 (two) times daily. 09/07/22   Aida Raider, NP  phenylephrine-shark liver oil-mineral oil-petrolatum (PREPARATION H) 0.25-3-14-71.9 % rectal ointment Place 1 Application rectally 2 (two) times daily. Apply internally for 10 days and then as needed. 09/07/22   Aida Raider, NP  trastuzumab 4 mg/kg in sodium chloride 0.9 % 250 mL Inject 4 mg/kg into the vein once.    [provider]    Physical Exam: Vitals:   11/09/22 1530 11/09/22 1545 11/09/22 1600 11/09/22 1615  BP: (!) 127/103 (!) 140/106 (!) 127/107 (!) 135/95  Pulse: (!) 114 (!) 103 (!) 114 (!) 121  Resp: (!) 25 (!) 34 18 (!) 22  Temp:      TempSrc:      SpO2: 91% 92% 92% 90%  Weight:      Height:        Constitutional: NAD, calm, comfortable Vitals:   11/09/22 1530 11/09/22 1545 11/09/22 1600 11/09/22 1615  BP: (!) 127/103 (!) 140/106 (!) 127/107 (!) 135/95  Pulse: (!) 114 (!) 103 (!)  114 (!) 121  Resp: (!) 25 (!) 34 18 (!) 22  Temp:      TempSrc:      SpO2: 91% 92% 92% 90%  Weight:      Height:       Eyes: PERRL, lids and conjunctivae normal ENMT: Mucous membranes are moist.  Neck: normal, supple, no masses, no thyromegaly Respiratory: clear to auscultation bilaterally, no wheezing, no crackles.  Mild increased work of breathing. No accessory muscle use.  Cardiovascular: Regular rate and rhythm, no murmurs / rubs / gallops. No extremity edema. 2+ pedal pulses. No carotid bruits.  Abdomen: no tenderness, no masses palpated. No hepatosplenomegaly. Bowel sounds positive.  Musculoskeletal: no clubbing / cyanosis.  Left BKA.   Skin: no rashes, lesions, ulcers. No induration Neurologic: No apparent cranial nerve  abnormality, strong bilateral grip strength, moving extremities spontaneously.  Psychiatric: Normal judgment and insight. Alert and oriented x 3. Normal mood.   Labs on Admission: I have personally reviewed following labs and imaging studies  CBC: Recent Labs  Lab 11/09/22 1425  WBC 7.5  NEUTROABS 5.2  HGB 13.1  HCT 41.7  MCV 91.4  PLT 211   Basic Metabolic Panel: Recent Labs  Lab 11/09/22 1425  NA 137  K 3.8  CL 103  CO2 25  GLUCOSE 138*  BUN 13  CREATININE 1.11*  CALCIUM 8.6*   GFR: Estimated Creatinine Clearance: 30.7 mL/min (A) (by C-G formula based on SCr of 1.11 mg/dL (H)). Liver Function Tests: Recent Labs  Lab 11/09/22 1425  AST 19  ALT 13  ALKPHOS 69  BILITOT 0.6  PROT 6.0*  ALBUMIN 3.2*   Radiological Exams on Admission: DG Shoulder Left  Result Date: 11/09/2022 CLINICAL DATA:  Fall. EXAM: LEFT SHOULDER - 2+ VIEW COMPARISON:  None Available. FINDINGS: There is no evidence of fracture or dislocation. There is no evidence of arthropathy or other focal bone abnormality. Soft tissues are unremarkable. IMPRESSION: Negative. Electronically Signed   By: Lupita Raider M.D.   On: 11/09/2022 15:25   DG Chest 2 View  Result  Date: 11/09/2022 CLINICAL DATA:  Shortness of breath. EXAM: CHEST - 2 VIEW COMPARISON:  Sep 11, 2022. FINDINGS: Stable cardiomegaly. Bibasilar interstitial densities are noted concerning for pulmonary edema or possibly atelectasis. Small bilateral pleural effusions are noted. Bony thorax is unremarkable. IMPRESSION: Bibasilar pulmonary edema or atelectasis is noted with associated pleural effusions. Electronically Signed   By: Lupita Raider M.D.   On: 11/09/2022 15:21    EKG: Independently reviewed.  Atrial fibrillation, rate 121, QTc 433.  PVCs present.  No significant change from prior.  Assessment/Plan Principal Problem:   Atrial fibrillation with RVR (HCC) Active Problems:   Acute diastolic (congestive) heart failure (HCC)   Essential hypertension   Stage 3 chronic kidney disease (HCC)   Assessment and Plan: * Atrial fibrillation with RVR (HCC) Heart rate up to 146.  Recent hospitalization 08/2022 with new diagnosis of atrial fibrillation, converted to sinus after being on Cardizem drip briefly.Marland Kitchen  She was discharged on metoprolol 12.5 and Eliquis.  Recent echo with EF of 60 to 65%, G2DD. -10 mg Cardizem bolus given, now on Cardizem drip continue -Resume metoprolol at increased dose 25 mg twice daily  Acute diastolic (congestive) heart failure (HCC) Presenting with dyspnea, chest x-ray showing bibasilar pulmonary edema, with BNP elevated at 361.  No prior to compare.  No significant peripheral edema.  Weights per chart unreliable.  Per med list on Lasix 20 mg daily. -IV Lasix 20mg  BID - Strict input- output, daily weights  Stage 3 chronic kidney disease (HCC) Creatinine 1.1.  Stable.  CKD stage III A/B.  -Monitor with diuresis  Essential hypertension Stable. -Hold Norvasc for now to allow for titration of rate limiting medications -Resume home metoprolol at increased dose 25 twice daily, continue Cardizem drip   DVT prophylaxis: Eliquis Code Status: DNR.  ACP documents reviewed.   Confirmed with daughter and patient at bedside. Family Communication: Daughter [name] at bedside. Disposition Plan: ~ 2 days Consults called: None  Admission status: Inpt stepdown I certify that at the point of admission it is my clinical judgment that the patient will require inpatient hospital care spanning beyond 2 midnights from the point of admission due to high intensity of service, high risk for further  deterioration and high frequency of surveillance required.   Author: Onnie Boer, MD 11/09/2022 6:55 PM  For on call review [email or web address].

## 2022-11-09 NOTE — Assessment & Plan Note (Signed)
Presenting with dyspnea, chest x-ray showing bibasilar pulmonary edema, with BNP elevated at 361.  No prior to compare.  No significant peripheral edema.  Weights per chart unreliable.  Per med list on Lasix 20 mg daily. -IV Lasix 20mg  BID - Strict input- output, daily weights

## 2022-11-09 NOTE — Assessment & Plan Note (Addendum)
Stable. -Hold Norvasc for now to allow for titration of rate limiting medications -Resume home metoprolol at increased dose 25 twice daily, continue Cardizem drip

## 2022-11-09 NOTE — Assessment & Plan Note (Signed)
Creatinine 1.1.  Stable.  CKD stage III A/B.  -Monitor with diuresis

## 2022-11-09 NOTE — Assessment & Plan Note (Signed)
Heart rates up to 146.  Recent hospitalization 08/2022 with new diagnosis of atrial fibrillation.

## 2022-11-09 NOTE — ED Triage Notes (Signed)
Pt came from [facility] living via EMS  Pt complains of SOB laying down and unable to sleep since Sunday   EMS stated that pt has crackles in b/L lower lobes.93% RA AFIB rate of 120's  Dx'ed with afib one month ago takes metoprolol and eliquis. Denies CP  EMS BGL 179 EMS 20 G IV LEFT AC

## 2022-11-09 NOTE — ED Notes (Signed)
Dilt changes per MAR HR 114-147 while this RN was at bedside Pt denies pain Pt still breathing rapidly

## 2022-11-09 NOTE — ED Provider Notes (Signed)
[place] [facility] AT [facility] Provider Note   CSN: [id number] Arrival date & time: 11/09/22  1403     History  Chief Complaint  Patient presents with   Shortness of Breath    [name] Petersen is a 89 y.o. female.   Shortness of Breath Patient with shortness of breath.  Feeling bad since last night.  Is had a cough and not sleep last night.  Does have a history of atrial fibrillation.  Is on Eliquis.  Has been compliant.  Reviewed previous discharge note and had admission back in May for A-fib with RVR.  Had converted during that stay with Cardizem.  Had been doing well otherwise but feeling a little off since last night.    Past Medical History:  Diagnosis Date   Arthritis    hands   Atherosclerotic PVD with ulceration (HCC) 07/15/2017   RIGHT HEEL   Breast cancer (HCC)    Cancer (HCC)    Complication of anesthesia    lost eyesight for a few days after surgery in the 1950's   Constipation    Diabetes mellitus without complication (HCC)    no medications    Hypertension    Macular degeneration, bilateral    "one eye wet; one eye dry; she gets shots" (07/07/2017)   PAD (peripheral artery disease) (HCC)    Pneumonia    Stroke (HCC)    some trouble swallowing    Home Medications Prior to Admission medications   Medication Sig Start Date End Date Taking? Authorizing Provider  acetaminophen (TYLENOL) 325 MG tablet Take 3 tablets (975 mg total) by mouth at bedtime as needed for mild pain. 09/12/22   Johnson, Clanford L, MD  amLODipine (NORVASC) 5 MG tablet Take 5 mg by mouth daily.    [provider]  apixaban (ELIQUIS) 5 MG TABS tablet Take 1 tablet (5 mg total) by mouth 2 (two) times daily. 09/12/22   Johnson, Clanford L, MD  beta carotene w/minerals (OCUVITE) tablet Take 1 tablet by mouth daily.    [provider]  calcium carbonate (TUMS - DOSED IN MG ELEMENTAL CALCIUM) 500 MG chewable tablet Chew 2 tablets by mouth as needed for  indigestion or heartburn.    [provider]  Cholecalciferol (VITAMIN D3) 50 MCG (2000 UT) capsule Take 2,000 Units by mouth daily.    [provider]  cyanocobalamin 1000 MCG tablet Take 1,000 mcg by mouth daily.    [provider]  furosemide (LASIX) 20 MG tablet Take 20 mg by mouth daily. Edema    [provider]  gabapentin (NEURONTIN) 100 MG capsule Take 100 mg by mouth daily.    [provider]  hydrocortisone (ANUSOL-HC) 2.5 % rectal cream Place 1 Application rectally 2 (two) times daily.    [provider]  letrozole (FEMARA) 2.5 MG tablet Take 2.5 mg by mouth daily.    [provider]  lovastatin (MEVACOR) 20 MG tablet Take 20 mg by mouth daily at 6 PM. 11/30/18   [provider]  Menthol, Topical Analgesic, (BIOFREEZE) 4 % GEL Apply topically as needed.    [provider]  metoprolol tartrate (LOPRESSOR) 25 MG tablet Take 0.5 tablets (12.5 mg total) by mouth 2 (two) times daily. 09/12/22   Johnson, Clanford L, MD  Multiple Vitamins-Minerals (MULTIVITAMIN WITH MINERALS) tablet Take 1 tablet by mouth daily.    [provider]  nystatin ointment (MYCOSTATIN) Apply 1 Application topically 2 (two) times daily. 09/07/22  Aida Raider, NP  phenylephrine-shark liver oil-mineral oil-petrolatum (PREPARATION H) 0.25-3-14-71.9 % rectal ointment Place 1 Application rectally 2 (two) times daily. Apply internally for 10 days and then as needed. 09/07/22   Aida Raider, NP  trastuzumab 4 mg/kg in sodium chloride 0.9 % 250 mL Inject 4 mg/kg into the vein once.    [provider]      Allergies    Prednisone and Scopace [scopolamine]    Review of Systems   Review of Systems  Respiratory:  Positive for shortness of breath.     Physical Exam Updated Vital Signs BP (!) 140/106   Pulse (!) 103   Temp 98.4 F (36.9 C) (Oral)   Resp (!) 34   Ht 5\' 3"  (1.6 m)   Wt 74.8 kg   SpO2 92%   BMI  29.23 kg/m  Physical Exam Vitals and nursing note reviewed.  Cardiovascular:     Rate and Rhythm: Tachycardia present. Rhythm irregular.  Pulmonary:     Breath sounds: No wheezing.     Comments: Somewhat harsh breath sounds at the bases Chest:     Chest wall: No mass.  Musculoskeletal:     Cervical back: Normal range of motion.     Right lower leg: Edema present.     Left lower leg: Edema present.  Skin:    General: Skin is warm.  Neurological:     Mental Status: She is alert and oriented to person, place, and time.     ED Results / Procedures / Treatments   Labs (all labs ordered are listed, but only abnormal results are displayed) Labs Reviewed  COMPREHENSIVE METABOLIC PANEL - Abnormal; Notable for the following components:      Result Value   Glucose, Bld 138 (*)    Creatinine, Ser 1.11 (*)    Calcium 8.6 (*)    Total Protein 6.0 (*)    Albumin 3.2 (*)    GFR, Estimated 46 (*)    All other components within normal limits  CBC WITH DIFFERENTIAL/PLATELET  APTT    EKG EKG Interpretation Date/Time:  Tuesday November 09 2022 14:18:52 EDT Ventricular Rate:  131 PR Interval:    QRS Duration:  74 QT Interval:  293 QTC Calculation: 433 R Axis:   44  Text Interpretation: Atrial fibrillation with rapid ventricular response Ventricular premature complex Low voltage, precordial leads Borderline T wave abnormalities Confirmed by Cathren Laine ([id number]) on 11/09/2022 2:29:02 PM  Radiology DG Shoulder Left  Result Date: 11/09/2022 CLINICAL DATA:  Fall. EXAM: LEFT SHOULDER - 2+ VIEW COMPARISON:  None Available. FINDINGS: There is no evidence of fracture or dislocation. There is no evidence of arthropathy or other focal bone abnormality. Soft tissues are unremarkable. IMPRESSION: Negative. Electronically Signed   By: Lupita Raider M.D.   On: 11/09/2022 15:25   DG Chest 2 View  Result Date: 11/09/2022 CLINICAL DATA:  Shortness of breath. EXAM: CHEST - 2 VIEW COMPARISON:  Sep 11, 2022. FINDINGS: Stable cardiomegaly. Bibasilar interstitial densities are noted concerning for pulmonary edema or possibly atelectasis. Small bilateral pleural effusions are noted. Bony thorax is unremarkable. IMPRESSION: Bibasilar pulmonary edema or atelectasis is noted with associated pleural effusions. Electronically Signed   By: Lupita Raider M.D.   On: 11/09/2022 15:21    Procedures Procedures    Medications Ordered in ED Medications  diltiazem (CARDIZEM) 1 mg/mL load via infusion 10 mg (10 mg Intravenous Bolus from Bag 11/09/22 1505)    And  diltiazem (CARDIZEM) 125 mg in dextrose 5% 125 mL (1 mg/mL) infusion (10 mg/hr Intravenous Rate/Dose Change 11/09/22 1535)    ED Course/ Medical Decision Making/ A&P                             Medical Decision Making Amount and/or Complexity of Data Reviewed Labs: ordered. Radiology: ordered. ECG/medicine tests: ordered.   Patient with shortness of breath cough and atrial fibrillation.  Has history of A-fib somewhat paroxysmally.  No chest pain.  Differential diagnosis with shortness of breath does include A-fib particular with her fast heart rate.  No pneumonia seen.  Reviewed discharge note.  Started on Cardizem drip.  I think patient benefit from rate control admission in the hospital.  Will discuss with hospitalist.  CRITICAL CARE Performed by: Benjiman Core Total critical care time: 30 minutes Critical care time was exclusive of separately billable procedures and treating other patients. Critical care was necessary to treat or prevent imminent or life-threatening deterioration. Critical care was time spent personally by me on the following activities: development of treatment plan with patient and/or surrogate as well as nursing, discussions with consultants, evaluation of patient's response to treatment, examination of patient, obtaining history from patient or surrogate, ordering and performing treatments and interventions,  ordering and review of laboratory studies, ordering and review of radiographic studies, pulse oximetry and re-evaluation of patient's condition.  Chads vasc 4   Patient reevaluated.  Continued tachycardia on the Cardizem drip.  Will admit for rate control and likely some diuresis.       Final Clinical Impression(s) / ED Diagnoses Final diagnoses:  Atrial fibrillation with rapid ventricular response [facility])    Rx / DC Orders ED Discharge Orders     None         Benjiman Core, MD 11/09/22 1620

## 2022-11-09 NOTE — ED Notes (Signed)
Titrated dilt per MAR HR 120-135 while this RN at bedside

## 2022-11-10 DIAGNOSIS — I4891 Unspecified atrial fibrillation: Secondary | ICD-10-CM | POA: Diagnosis not present

## 2022-11-10 DIAGNOSIS — Z4781 Encounter for orthopedic aftercare following surgical amputation: Secondary | ICD-10-CM | POA: Diagnosis not present

## 2022-11-10 DIAGNOSIS — S88112S Complete traumatic amputation at level between knee and ankle, left lower leg, sequela: Secondary | ICD-10-CM | POA: Diagnosis not present

## 2022-11-10 DIAGNOSIS — Z89512 Acquired absence of left leg below knee: Secondary | ICD-10-CM | POA: Diagnosis not present

## 2022-11-10 DIAGNOSIS — I4892 Unspecified atrial flutter: Secondary | ICD-10-CM | POA: Diagnosis present

## 2022-11-10 DIAGNOSIS — L89612 Pressure ulcer of right heel, stage 2: Secondary | ICD-10-CM | POA: Diagnosis not present

## 2022-11-10 LAB — CBC
HCT: 43.2 % (ref 36.0–46.0)
Hemoglobin: 13.3 g/dL (ref 12.0–15.0)
MCH: 28.9 pg (ref 26.0–34.0)
MCHC: 30.8 g/dL (ref 30.0–36.0)
MCV: 93.7 fL (ref 80.0–100.0)
Platelets: 215 10*3/uL (ref 150–400)
RBC: 4.61 MIL/uL (ref 3.87–5.11)
RDW: 14.8 % (ref 11.5–15.5)
WBC: 7.3 10*3/uL (ref 4.0–10.5)
nRBC: 0 % (ref 0.0–0.2)

## 2022-11-10 LAB — BASIC METABOLIC PANEL
Anion gap: 10 (ref 5–15)
BUN: 15 mg/dL (ref 8–23)
CO2: 27 mmol/L (ref 22–32)
Calcium: 9.2 mg/dL (ref 8.9–10.3)
Chloride: 102 mmol/L (ref 98–111)
Creatinine, Ser: 1.32 mg/dL — ABNORMAL HIGH (ref 0.44–1.00)
GFR, Estimated: 38 mL/min — ABNORMAL LOW (ref >60)
Glucose, Bld: 148 mg/dL — ABNORMAL HIGH (ref 70–99)
Potassium: 4.2 mmol/L (ref 3.5–5.1)
Sodium: 139 mmol/L (ref 135–145)

## 2022-11-10 LAB — MRSA NEXT GEN BY PCR, NASAL: MRSA by PCR Next Gen: NOT DETECTED

## 2022-11-10 MED ORDER — DILTIAZEM HCL 30 MG PO TABS
30.0000 mg | ORAL_TABLET | Freq: Four times a day (QID) | ORAL | Status: DC
Start: 2022-11-10 — End: 2022-11-10

## 2022-11-10 MED ORDER — DILTIAZEM HCL 30 MG PO TABS
30.0000 mg | ORAL_TABLET | Freq: Four times a day (QID) | ORAL | Status: DC
  Administered 2022-11-10: 30 mg via ORAL
  Filled 2022-11-10: qty 1

## 2022-11-10 MED ORDER — DILTIAZEM HCL ER COATED BEADS 180 MG PO CP24: 180.0000 mg | ORAL_CAPSULE | Freq: Every day | ORAL | 11 refills | Status: DC

## 2022-11-10 NOTE — Care Management Obs Status (Signed)
MEDICARE OBSERVATION STATUS NOTIFICATION   Patient Details  Name: [name] Petersen MRN: [id number] Date of Birth: 1926-07-01   Medicare Observation Status Notification Given:  Yes    Karn Cassis, LCSW 11/10/2022, 2:39 PM

## 2022-11-10 NOTE — Discharge Instructions (Signed)
1) please stop metoprolol due to excessive fatigue 2) please take Cardizem/diltiazem for atrial fibrillation to keep your heart rate from going too fast 3) please stop amlodipine 4) please continue Eliquis/apixaban and your other medications as previously prescribed 5)Avoid ibuprofen/Advil/Aleve/Motrin/Goody Powders/Naproxen/BC powders/Meloxicam/Diclofenac/Indomethacin and other Nonsteroidal anti-inflammatory medications as these will make you more likely to bleed and can cause stomach ulcers, can also cause Kidney problems.

## 2022-11-10 NOTE — Discharge Summary (Signed)
[name] Petersen, is a 89 y.o. female  DOB [birth year]  MRN [id number].  Admission date:  11/09/2022  Admitting Physician  Onnie Boer, MD  Discharge Date:  11/10/2022   Primary MD  Pcp, No  Recommendations for primary care physician for things to follow:   1) please stop metoprolol due to excessive fatigue 2) please take Cardizem/diltiazem for atrial fibrillation to keep your heart rate from going too fast 3) please stop amlodipine 4) please continue Eliquis/apixaban and your other medications as previously prescribed 5)Avoid ibuprofen/Advil/Aleve/Motrin/Goody Powders/Naproxen/BC powders/Meloxicam/Diclofenac/Indomethacin and other Nonsteroidal anti-inflammatory medications as these will make you more likely to bleed and can cause stomach ulcers, can also cause Kidney problems.   Admission Diagnosis  Atrial fibrillation with rapid ventricular response (HCC) [I48.91] Atrial flutter with rapid ventricular response (HCC) [I48.92]   Discharge Diagnosis  Atrial fibrillation with rapid ventricular response (HCC) [I48.91] Atrial flutter with rapid ventricular response (HCC) [I48.92]    Principal Problem:   Atrial fibrillation with RVR (HCC) Active Problems:   Acute diastolic (congestive) heart failure (HCC)   Essential hypertension   Stage 3 chronic kidney disease (HCC)   Atrial flutter with rapid ventricular response (HCC)      Past Medical History:  Diagnosis Date   Arthritis    hands   Atherosclerotic PVD with ulceration (HCC) 07/15/2017   RIGHT HEEL   Breast cancer (HCC)    Cancer (HCC)    Complication of anesthesia    lost eyesight for a few days after surgery in the 1950's   Constipation    Diabetes mellitus without complication (HCC)    no medications    Hypertension    Macular degeneration, bilateral    "one eye wet; one eye dry; she gets shots" (07/07/2017)   PAD (peripheral  artery disease) (HCC)    Pneumonia    Stroke (HCC)    some trouble swallowing    Past Surgical History:  Procedure Laterality Date   ABDOMINAL AORTOGRAM W/LOWER EXTREMITY N/A 06/16/2017   Procedure: ABDOMINAL AORTOGRAM W/LOWER EXTREMITY;  Surgeon: Fransisco Hertz, MD;  Location: [facility] INVASIVE CV LAB;  Service: Cardiovascular;  Laterality: N/A;  lt extermity   ABDOMINAL HYSTERECTOMY     AMPUTATION Left 07/06/2017   Procedure: AMPUTATION BELOW KNEE LEFT;  Surgeon: Larina Earthly, MD;  Location: [facility] OR;  Service: Vascular;  Laterality: Left;   CATARACT EXTRACTION W/ INTRAOCULAR LENS  IMPLANT, BILATERAL     DILATION AND CURETTAGE OF UTERUS     LOWER EXTREMITY ANGIOGRAPHY N/A 07/14/2017   Procedure: LOWER EXTREMITY ANGIOGRAPHY;  Surgeon: Chuck Hint, MD;  Location: [facility] INVASIVE CV LAB;  Service: Cardiovascular;  Laterality: N/A;     HPI  from the history and physical done on the day of admission:  Chief Complaint: Difficulty breathing    HPI: [name] Petersen is a 89 y.o. female with medical history significant for, stroke, peripheral artery disease, diabetes mellitus, left BKA. Patient presented to the ED with complaints of difficulty breathing that started last night.  At time of my evaluation, patient is awake alert oriented and able to answer questions, daughter [name] is at bedside.  Patient denies chest pain, no right lower extremity swelling.   Recent hospitalization 5/25 to 5/26-admitted with atrial fibrillation with RVR, patient converted to sinus rhythm after being briefly on Cardizem drip.  Started on metoprolol, and discharged on also Eliquis.   ED Course: Tmax 98.4.  Tachycardic up to 146, respiratory 18- 34.  Systolic blood pressure 120s to 140s.  O2 sats 90 to 94% on room air.  EKG showing atrial fibrillation rate 131. BNP 361.  No prior to compare. Chest x-ray-bibasilar pulmonary edema or atelectasis plus pleural effusion. Left shoulder x-ray negative. Cardizem 10 mg given  started on Cardizem drip.   Review of Systems: As per HPI all other systems reviewed and negative.    Hospital Course:   Assessment and Plan: 1)Atrial fibrillation with RVR (HCC) Heart rate up to 146.  Recent hospitalization 08/2022 with new diagnosis of atrial fibrillation, converted to sinus after being on Cardizem drip briefly.Marland Kitchen  She was discharged on metoprolol 12.5 and Eliquis.  Recent echo with EF of 60 to 65%, G2DD. -Patient quit taking metoprolol for the last couple of weeks due to fatigue -Readmitted in A-fib with RVR on 11/09/2022 -Heart rate improved on IV Cardizem drip--remains in A-fib -Okay to discharge on p.o. Cardizem -Okay to stop metoprolol due to poor tolerance/fatigue -Stop amlodipine while on Cardizem -Continue Eliquis for stroke prophylaxis  2)Acute diastolic (congestive) heart failure (HCC) Presenting with dyspnea, chest x-ray showing bibasilar pulmonary edema, with BNP elevated at 361.  No prior to compare.  No significant peripheral edema.  Weights per chart unreliable.  Per med list on Lasix 20 mg daily. Responded well to IV Lasix -Diastolic CHF-exacerbation is secondary to A-fib with RVR -Okay to discharge back on PTA dose of Lasix 20 mg daily now that rate control is better  Stage 3 chronic kidney disease (HCC) Stable.  CKD stage III A/B.  -Be within a week with PCP  Essential hypertension Stable. -Okay to discharge on p.o. Cardizem -Okay to stop metoprolol due to poor tolerance/fatigue -Stop amlodipine while on Cardizem  Discharge Condition: stable  Follow UP--- PCP and cardiologist as advised  Diet and Activity recommendation:  As advised  Discharge Instructions    Discharge Instructions     Call MD for:  difficulty breathing, headache or visual disturbances   Complete by: As directed    Call MD for:  extreme fatigue   Complete by: As directed    Call MD for:  persistant dizziness or light-headedness   Complete by: As directed    Call MD  for:  persistant nausea and vomiting   Complete by: As directed    Call MD for:  temperature >100.4   Complete by: As directed    Diet - low sodium heart healthy   Complete by: As directed    Discharge instructions   Complete by: As directed    1) please stop metoprolol due to excessive fatigue 2) please take Cardizem/diltiazem for atrial fibrillation to keep your heart rate from going too fast 3) please stop amlodipine 4) please continue Eliquis/apixaban and your other medications as previously prescribed 5)Avoid ibuprofen/Advil/Aleve/Motrin/Goody Powders/Naproxen/BC powders/Meloxicam/Diclofenac/Indomethacin and other Nonsteroidal anti-inflammatory medications as these will make you more likely to bleed and can cause stomach ulcers, can also cause Kidney problems.   Increase activity slowly   Complete by: As directed  Discharge Medications     Allergies as of 11/10/2022       Reactions   Prednisone Other (See Comments)   Other Reaction(s): Fatique   Scopace [scopolamine] Other (See Comments)   Hallucinations        Medication List     STOP taking these medications    amLODipine 5 MG tablet Commonly known as: NORVASC   metoprolol tartrate 25 MG tablet Commonly known as: LOPRESSOR       TAKE these medications    acetaminophen 325 MG tablet Commonly known as: TYLENOL Take 3 tablets (975 mg total) by mouth at bedtime as needed for mild pain.   apixaban 5 MG Tabs tablet Commonly known as: ELIQUIS Take 1 tablet (5 mg total) by mouth 2 (two) times daily.   beta carotene w/minerals tablet Take 1 tablet by mouth daily.   multivitamin with minerals tablet Take 1 tablet by mouth daily.   Biofreeze 4 % Gel Generic drug: Menthol (Topical Analgesic) Apply topically as needed.   calcium carbonate 500 MG chewable tablet Commonly known as: TUMS - dosed in mg elemental calcium Chew 2 tablets by mouth as needed for indigestion or heartburn.    cyanocobalamin 1000 MCG tablet Take 1,000 mcg by mouth daily.   diltiazem 180 MG 24 hr capsule Commonly known as: Cardizem CD Take 1 capsule (180 mg total) by mouth daily.   furosemide 20 MG tablet Commonly known as: LASIX Take 20 mg by mouth daily. Edema   gabapentin 100 MG capsule Commonly known as: NEURONTIN Take 100 mg by mouth daily.   hydrocortisone 2.5 % rectal cream Commonly known as: ANUSOL-HC Place 1 Application rectally 2 (two) times daily.   letrozole 2.5 MG tablet Commonly known as: FEMARA Take 2.5 mg by mouth daily.   lovastatin 20 MG tablet Commonly known as: MEVACOR Take 20 mg by mouth daily at 6 PM.   nystatin ointment Commonly known as: MYCOSTATIN Apply 1 Application topically 2 (two) times daily.   phenylephrine-shark liver oil-mineral oil-petrolatum 0.25-3-14-71.9 % rectal ointment Commonly known as: PREPARATION H Place 1 Application rectally 2 (two) times daily. Apply internally for 10 days and then as needed.   trastuzumab 4 mg/kg in sodium chloride 0.9 % 250 mL Inject 4 mg/kg into the vein once.   Vitamin D3 50 MCG (2000 UT) capsule Take 2,000 Units by mouth daily.               Durable Medical Equipment  (From admission, onward)           Start     Ordered   11/10/22 1014  For home use only DME Hospital bed  Once       Comments: Patient requires frequent re-positioning of the body in ways that cannot be achieved with an ordinary bed or wedge pillow, to eliminate pain, reduce pressure, and the head of the  bed to be elevated more than 30 degrees most of the time due to congestive heart failure.  Question Answer Comment  Length of Need Lifetime   Patient has (list medical condition): CHF   The above medical condition requires: Patient requires the ability to reposition frequently   Head must be elevated greater than: 30 degrees   Bed type Semi-electric   Support Surface: Gel Overlay      11/10/22 1014            Major  procedures and Radiology Reports - PLEASE review detailed and final reports for all details, in  brief -   DG Shoulder Left  Result Date: 11/09/2022 CLINICAL DATA:  Fall. EXAM: LEFT SHOULDER - 2+ VIEW COMPARISON:  None Available. FINDINGS: There is no evidence of fracture or dislocation. There is no evidence of arthropathy or other focal bone abnormality. Soft tissues are unremarkable. IMPRESSION: Negative. Electronically Signed   By: Lupita Raider M.D.   On: 11/09/2022 15:25   DG Chest 2 View  Result Date: 11/09/2022 CLINICAL DATA:  Shortness of breath. EXAM: CHEST - 2 VIEW COMPARISON:  Sep 11, 2022. FINDINGS: Stable cardiomegaly. Bibasilar interstitial densities are noted concerning for pulmonary edema or possibly atelectasis. Small bilateral pleural effusions are noted. Bony thorax is unremarkable. IMPRESSION: Bibasilar pulmonary edema or atelectasis is noted with associated pleural effusions. Electronically Signed   By: Lupita Raider M.D.   On: 11/09/2022 15:21    Micro Results   Recent Results (from the past 240 hour(s))  MRSA Next Gen by PCR, Nasal     Status: None   Collection Time: 11/09/22  5:49 PM   Specimen: Nasal Mucosa; Nasal Swab  Result Value Ref Range Status   MRSA by PCR Next Gen NOT DETECTED NOT DETECTED Final    Comment: (NOTE) The GeneXpert MRSA Assay (FDA approved for NASAL specimens only), is one component of a comprehensive MRSA colonization surveillance program. It is not intended to diagnose MRSA infection nor to guide or monitor treatment for MRSA infections. Test performance is not FDA approved in patients less than 33 years old. Performed at [facility], [address] [place], [place] [address]   Today   Subjective    [name] Petersen today has no new complaints -Daughter Kermit Balo at bedside, questions answered        -No chest pains no palpitations no dizziness no dyspnea   Patient has been seen and examined prior to discharge   Objective   Blood  pressure 122/79, pulse (!) 25, temperature 97.7 F (36.5 C), temperature source Axillary, resp. rate 20, height 5\' 3"  (1.6 m), weight 72.4 kg, SpO2 93%.   Intake/Output Summary (Last 24 hours) at 11/10/2022 1457 Last data filed at 11/10/2022 6578 Gross per 24 hour  Intake 282.2 ml  Output 400 ml  Net -117.8 ml    Exam Gen:- Awake Alert, no acute distress  HEENT:- [place].AT, No sclera icterus Neck-Supple Neck,No JVD,.  Lungs-  CTAB , good air movement bilaterally CV- S1, S2 normal, irregularly irregular, heart rate around 60 Abd-  +ve B.Sounds, Abd Soft, No tenderness,    Extremity/Skin:- No  edema,   good pulses, left BKA Psych-affect is appropriate, oriented x3 Neuro-no new focal deficits, no tremors    Data Review   CBC w Diff:  Lab Results  Component Value Date   WBC 7.3 11/10/2022   HGB 13.3 11/10/2022   HCT 43.2 11/10/2022   PLT 215 11/10/2022   LYMPHOPCT 19 11/09/2022   MONOPCT 10 11/09/2022   EOSPCT 2 11/09/2022   BASOPCT 0 11/09/2022   CMP:  Lab Results  Component Value Date   NA 139 11/10/2022   K 4.2 11/10/2022   CL 102 11/10/2022   CO2 27 11/10/2022   BUN 15 11/10/2022   CREATININE 1.32 (H) 11/10/2022   PROT 6.0 (L) 11/09/2022   ALBUMIN 3.2 (L) 11/09/2022   BILITOT 0.6 11/09/2022   ALKPHOS 69 11/09/2022   AST 19 11/09/2022   ALT 13 11/09/2022   Total Discharge time is about 33 minutes  Shon Hale M.D on 11/10/2022  at 2:57 PM  Go to [email or web address] -  for contact info  [facility] - Office  [phone]

## 2022-11-10 NOTE — Progress Notes (Signed)
   11/10/22 0726  ReDS Vest / Clip  Station Marker B  Ruler Value 39  ReDS Value Range < 36  ReDS Actual Value 18

## 2022-11-10 NOTE — Progress Notes (Signed)
Patient alert and oriented x3-4. No current complaints of pain, shortness of breath, chest pain, dizziness, nausea or vomiting. Patient tolerated PO meds and diet well, appetite fair. Went over discharge summary and medication education with patient and her two daughters, Elita Quick and Clydie Braun. All questions answered and they expressed full understanding with teach back. IV removed with no complications. Patient discharged with all belongings to [facility] via car.

## 2022-11-10 NOTE — TOC Transition Note (Addendum)
Transition of Care [facility]) - CM/SW Discharge Note   Patient Details  Name: [name] Petersen MRN: [id number] Date of Birth: 1927-04-13  Transition of Care [facility]) CM/SW Contact:  Karn Cassis, LCSW Phone Number: 11/10/2022, 3:09 PM   Clinical Narrative:  Pt d/c today back to [facility]. Pt, pt's daughters, and facility aware and agreeable. Daughters to provide transport. D/C summary and FL2 sent to ALF. Sarah with [facility] aware of d/c and home health orders are in. Hospital bed has already been delivered.      Final next level of care: Assisted Living Barriers to Discharge: Barriers Resolved   Patient Goals and CMS Choice   Choice offered to / list presented to : Adult Children  Discharge Placement                  Patient to be transferred to facility by: family Name of family member notified: [name]- daughter Patient and family notified of of transfer: 11/10/22  Discharge Plan and Services Additional resources added to the After Visit Summary for   In-house Referral: Clinical Social Work   Post Acute Care Choice: Resumption of Svcs/PTA Provider          DME Arranged: Hospital bed DME Agency: [employer] Date DME Agency Contacted: 11/10/22 Time DME Agency Contacted: [phone] Representative spoke with at DME Agency: [employer] HH Arranged: PT, OT HH Agency: Other - See comment Cindie Laroche) Date HH Agency Contacted: 11/10/22 Time HH Agency Contacted: [phone] Representative spoke with at [facility] Agency: Maralyn Sago  Social Determinants of Health (SDOH) Interventions SDOH Screenings   Food Insecurity: No Food Insecurity (11/09/2022)  Housing: Patient Declined (11/09/2022)  Transportation Needs: No Transportation Needs (11/09/2022)  Utilities: Not At Risk (11/09/2022)  Financial Resource Strain: Low Risk  (06/29/2022)   Received from [facility], [facility]  Physical Activity: Inactive (06/29/2022)   Received from [facility], [facility]  Social Connections:  Moderately Integrated (06/29/2022)   Received from [facility], [facility]  Stress: No Stress Concern Present (06/29/2022)   Received from [facility], [facility]  Tobacco Use: Low Risk  (11/09/2022)  Health Literacy: Low Risk  (06/29/2022)   Received from [facility], [facility]     Readmission Risk Interventions    08/26/2020   11:01 AM  Readmission Risk Prevention Plan  Transportation Screening Complete  Home Care Screening Complete  Medication Review (RN CM) Complete

## 2022-11-10 NOTE — Care Management CC44 (Signed)
Condition Code 44 Documentation Completed  Patient Details  Name: [name] Petersen MRN: [id number] Date of Birth: 1926/05/04   Condition Code 44 given:  Yes Patient signature on Condition Code 44 notice:  Yes Documentation of 2 MD's agreement:  Yes Code 44 added to claim:  Yes    Karn Cassis, LCSW 11/10/2022, 2:39 PM

## 2022-11-10 NOTE — NC FL2 (Signed)
[place] MEDICAID FL2 LEVEL OF CARE FORM     IDENTIFICATION  Patient Name: [name] Petersen Birthdate: Aug 17, 1926 Sex: female Admission Date (Current Location): 11/09/2022  [facility] and [place] Number:  [employer] and Address:  [facility],  [address], [place] [address]      Provider Number: [id number]  Attending Physician Name and Address:  Shon Hale, MD  Relative Name and Phone Number:       Current Level of Care: Hospital Recommended Level of Care: Assisted Living Facility Prior Approval Number:    Date Approved/Denied:   PASRR Number:    Discharge Plan: Other (Comment) (ALF)    Current Diagnoses: Patient Active Problem List   Diagnosis Date Noted   Atrial flutter with rapid ventricular response (HCC) 11/10/2022   Acute diastolic (congestive) heart failure (HCC) 11/09/2022   Atrial fibrillation with RVR (HCC) 09/11/2022   History of stroke 08/21/2020   Hyperlipidemia 08/21/2020   Urinary retention    Phantom limb pain (HCC)    Unilateral complete BKA, left, sequela (HCC)    Slow transit constipation    Atherosclerotic PVD with ulceration (HCC) 07/14/2017   Pressure injury of right heel, stage 3 (HCC)    Sleep disturbance    Poor nutrition    Hypokalemia    Drug-induced constipation    Essential hypertension    Stage 3 chronic kidney disease (HCC)    Postoperative pain    Type 2 diabetes mellitus with obesity (HCC)    S/P below knee amputation, left (HCC) 07/10/2017   PAD (peripheral artery disease) (HCC) 07/06/2017   Atherosclerosis of native arteries of the extremities with ulceration (HCC) 06/16/2017    Orientation RESPIRATION BLADDER Height & Weight     Self, Time, Situation, Place  Normal Continent Weight: 159 lb 9.8 oz (72.4 kg) Height:  5\' 3"  (160 cm)  BEHAVIORAL SYMPTOMS/MOOD NEUROLOGICAL BOWEL NUTRITION STATUS      Continent Diet (Low sodium heart healthy)  AMBULATORY STATUS COMMUNICATION OF NEEDS Skin    Extensive Assist Verbally Normal                       Personal Care Assistance Level of Assistance  Bathing, Feeding, Dressing Bathing Assistance: Limited assistance Feeding assistance: Limited assistance Dressing Assistance: Limited assistance     Functional Limitations Info  Sight, Hearing, Speech Sight Info: Impaired Hearing Info: Impaired Speech Info: Adequate    SPECIAL CARE FACTORS FREQUENCY                       Contractures      Additional Factors Info  Code Status, Allergies Code Status Info: DNR Allergies Info: Prednisone, Scopace (scopolamine)           Current Medications (11/10/2022):  This is the current hospital active medication list Current Facility-Administered Medications  Medication Dose Route Frequency Provider Last Rate Last Admin   acetaminophen (TYLENOL) tablet 650 mg  650 mg Oral Q6H PRN Emokpae, Ejiroghene E, MD       Or   acetaminophen (TYLENOL) suppository 650 mg  650 mg Rectal Q6H PRN Emokpae, Ejiroghene E, MD       apixaban (ELIQUIS) tablet 5 mg  5 mg Oral BID Emokpae, Ejiroghene E, MD   5 mg at 11/10/22 0911   Chlorhexidine Gluconate Cloth 2 % PADS 6 each  6 each Topical Q0600 Emokpae, Ejiroghene E, MD   6 each at 11/10/22 0547   diltiazem (  CARDIZEM) tablet 30 mg  30 mg Oral Q6H Emokpae, Courage, MD   30 mg at 11/10/22 1005   furosemide (LASIX) injection 20 mg  20 mg Intravenous BID Emokpae, Ejiroghene E, MD   20 mg at 11/10/22 0911   letrozole [facility]) tablet 2.5 mg  2.5 mg Oral Daily Emokpae, Ejiroghene E, MD   2.5 mg at 11/10/22 0916   metoprolol tartrate (LOPRESSOR) tablet 25 mg  25 mg Oral BID Emokpae, Ejiroghene E, MD   25 mg at 11/10/22 0911   ondansetron (ZOFRAN) tablet 4 mg  4 mg Oral Q6H PRN Emokpae, Ejiroghene E, MD       Or   ondansetron (ZOFRAN) injection 4 mg  4 mg Intravenous Q6H PRN Emokpae, Ejiroghene E, MD       Oral care mouth rinse  15 mL Mouth Rinse PRN Emokpae, Ejiroghene E, MD       polyethylene glycol  (MIRALAX / GLYCOLAX) packet 17 g  17 g Oral Daily PRN Emokpae, Ejiroghene E, MD         Discharge Medications: TAKE these medications     acetaminophen 325 MG tablet Commonly known as: TYLENOL Take 3 tablets (975 mg total) by mouth at bedtime as needed for mild pain.    apixaban 5 MG Tabs tablet Commonly known as: ELIQUIS Take 1 tablet (5 mg total) by mouth 2 (two) times daily.    beta carotene w/minerals tablet Take 1 tablet by mouth daily.    multivitamin with minerals tablet Take 1 tablet by mouth daily.    Biofreeze 4 % Gel Generic drug: Menthol (Topical Analgesic) Apply topically as needed.    calcium carbonate 500 MG chewable tablet Commonly known as: TUMS - dosed in mg elemental calcium Chew 2 tablets by mouth as needed for indigestion or heartburn.    cyanocobalamin 1000 MCG tablet Take 1,000 mcg by mouth daily.    diltiazem 180 MG 24 hr capsule Commonly known as: Cardizem CD Take 1 capsule (180 mg total) by mouth daily.    furosemide 20 MG tablet Commonly known as: LASIX Take 20 mg by mouth daily. Edema    gabapentin 100 MG capsule Commonly known as: NEURONTIN Take 100 mg by mouth daily.    hydrocortisone 2.5 % rectal cream Commonly known as: ANUSOL-HC Place 1 Application rectally 2 (two) times daily.    letrozole 2.5 MG tablet Commonly known as: FEMARA Take 2.5 mg by mouth daily.    lovastatin 20 MG tablet Commonly known as: MEVACOR Take 20 mg by mouth daily at 6 PM.    nystatin ointment Commonly known as: MYCOSTATIN Apply 1 Application topically 2 (two) times daily.    phenylephrine-shark liver oil-mineral oil-petrolatum 0.25-3-14-71.9 % rectal ointment Commonly known as: PREPARATION H Place 1 Application rectally 2 (two) times daily. Apply internally for 10 days and then as needed.    trastuzumab 4 mg/kg in sodium chloride 0.9 % 250 mL Inject 4 mg/kg into the vein once.    Vitamin D3 50 MCG (2000 UT) capsule Take 2,000 Units by mouth  daily.      Relevant Imaging Results:  Relevant Lab Results:   Additional Information Daily weights. HHPT/OT.  Karn Cassis, LCSW

## 2022-11-10 NOTE — TOC Initial Note (Addendum)
Transition of Care [facility]) - Initial/Assessment Note    Patient Details  Name: [name] Petersen MRN: [id number] Date of Birth: September 23, 1926  Transition of Care [facility]) CM/SW Contact:    Karn Cassis, LCSW Phone Number: 11/10/2022, 8:14 AM  Clinical Narrative: Pt admitted due to atrial fibrillation with RVR. Per daughter, [name], pt has been a resident at [facility] for about 5 years. Family plans on return to [place] at d/c. Per Marcelino Duster at facility, pt requires limited assist with ADLs. She is active with [facility] OT. Will need to add PT at d/c. Okay to return. [facility] requesting hospital bed. Will order through [employer].   TOC received consult for CHF screening. Marcelino Duster indicates this is a new diagnosis. Facility to start daily weights. Pt takes medications as prescribed. LCSW added CHF education to AVS.                   Expected Discharge Plan: Assisted Living Barriers to Discharge: Continued Medical Work up   Patient Goals and CMS Choice Patient states their goals for this hospitalization and ongoing recovery are:: return to ALF   Choice offered to / list presented to : Adult Children [place] ownership interest in [facility].provided to::  (n/a)    Expected Discharge Plan and Services In-house Referral: Clinical Social Work   Post Acute Care Choice: Resumption of Svcs/PTA Provider Living arrangements for the past 2 months: Assisted Living Facility                           HH Arranged: PT, OT HH Agency: Other - See comment [employer]) Date HH Agency Contacted: 11/10/22 Time HH Agency Contacted: [phone] Representative spoke with at [facility] Agency: Sarah  Prior Living Arrangements/Services Living arrangements for the past 2 months: Assisted Living Facility Lives with:: Facility Resident Patient language and need for interpreter reviewed:: Yes Do you feel safe going back to the place where you live?: Yes          Current home services: Home PT,  Home OT, DME (wheelchair, 3N1) Criminal Activity/Legal Involvement Pertinent to Current Situation/Hospitalization: No - Comment as needed  Activities of Daily Living Home Assistive Devices/Equipment: Wheelchair ADL Screening (condition at time of admission) Patient's cognitive ability adequate to safely complete daily activities?: Yes Is the patient deaf or have difficulty hearing?: No Does the patient have difficulty seeing, even when wearing glasses/contacts?: No Does the patient have difficulty concentrating, remembering, or making decisions?: Yes Patient able to express need for assistance with ADLs?: Yes Does the patient have difficulty dressing or bathing?: Yes Independently performs ADLs?: No Communication: Independent Dressing (OT): Needs assistance Is this a change from baseline?: Pre-admission baseline Grooming: Needs assistance Is this a change from baseline?: Pre-admission baseline Feeding: Independent Bathing: Needs assistance Is this a change from baseline?: Pre-admission baseline Toileting: Needs assistance Is this a change from baseline?: Pre-admission baseline In/Out Bed: Dependent Is this a change from baseline?: Pre-admission baseline Walks in Home: Dependent, Independent with device (comment) (wheelchair) Is this a change from baseline?: Pre-admission baseline Does the patient have difficulty walking or climbing stairs?: Yes Weakness of Legs: Both Weakness of Arms/Hands: None  Permission Sought/Granted Permission sought to share information with : Facility Industrial/product designer granted to share information with : Yes, Verbal Permission Granted     Permission granted to share info w AGENCY: [facility]  Permission granted to share info w Relationship: ALF     Emotional Assessment  Alcohol / Substance Use: Not Applicable Psych Involvement: No (comment)  Admission diagnosis:  Atrial fibrillation with rapid ventricular  response (HCC) [I48.91] Atrial flutter with rapid ventricular response (HCC) [I48.92] Patient Active Problem List   Diagnosis Date Noted   Acute diastolic (congestive) heart failure (HCC) 11/09/2022   Atrial fibrillation with RVR (HCC) 09/11/2022   History of stroke 08/21/2020   Hyperlipidemia 08/21/2020   Urinary retention    Phantom limb pain (HCC)    Unilateral complete BKA, left, sequela (HCC)    Slow transit constipation    Atherosclerotic PVD with ulceration (HCC) 07/14/2017   Pressure injury of right heel, stage 3 (HCC)    Sleep disturbance    Poor nutrition    Hypokalemia    Drug-induced constipation    Essential hypertension    Stage 3 chronic kidney disease (HCC)    Postoperative pain    Type 2 diabetes mellitus with obesity (HCC)    S/P below knee amputation, left (HCC) 07/10/2017   PAD (peripheral artery disease) (HCC) 07/06/2017   Atherosclerosis of native arteries of the extremities with ulceration (HCC) 06/16/2017   PCP:  Pcp, No Pharmacy:   [facility] [place], [place] - [address] [place] [address] Phone: [phone] Fax: [phone]  [facility] [place], [place] [address] [place] [address] Phone: [phone] Fax: [phone]     Social Determinants of Health (SDOH) Social History: SDOH Screenings   Food Insecurity: No Food Insecurity (11/09/2022)  Housing: Patient Declined (11/09/2022)  Transportation Needs: No Transportation Needs (11/09/2022)  Utilities: Not At Risk (11/09/2022)  Financial Resource Strain: Low Risk  (06/29/2022)   Received from [facility], [facility]  Physical Activity: Inactive (06/29/2022)   Received from [facility], [facility]  Social Connections: Moderately Integrated (06/29/2022)   Received from [facility], [facility]  Stress: No Stress  Concern Present (06/29/2022)   Received from [facility], [facility]  Tobacco Use: Low Risk  (11/09/2022)  Health Literacy: Low Risk  (06/29/2022)   Received from [facility]   SDOH Interventions:     Readmission Risk Interventions    08/26/2020   11:01 AM  Readmission Risk Prevention Plan  Transportation Screening Complete  Home Care Screening Complete  Medication Review (RN CM) Complete

## 2022-11-10 NOTE — Plan of Care (Signed)

## 2022-11-10 NOTE — Progress Notes (Signed)
Patient requires frequent re-positioning of the body in ways that cannot be achieved with an ordinary bed or wedge pillow, to eliminate pain, reduce pressure, and the head of the bed to be elevated more than 30 degrees most of the time due to congestive heart failure.  ?

## 2022-11-11 ENCOUNTER — Emergency Department (HOSPITAL_COMMUNITY): Payer: Medicare Other

## 2022-11-11 ENCOUNTER — Encounter (HOSPITAL_COMMUNITY): Payer: Self-pay

## 2022-11-11 ENCOUNTER — Other Ambulatory Visit: Payer: Self-pay

## 2022-11-11 ENCOUNTER — Emergency Department (HOSPITAL_COMMUNITY)
Admission: EM | Admit: 2022-11-11 | Discharge: 2022-11-11 | Disposition: A | Discharge status: Skilled Nursing Facility | Payer: Medicare Other | Attending: Emergency Medicine | Admitting: Emergency Medicine

## 2022-11-11 DIAGNOSIS — R140 Abdominal distension (gaseous): Secondary | ICD-10-CM | POA: Diagnosis not present

## 2022-11-11 DIAGNOSIS — R0989 Other specified symptoms and signs involving the circulatory and respiratory systems: Secondary | ICD-10-CM | POA: Diagnosis not present

## 2022-11-11 DIAGNOSIS — R0602 Shortness of breath: Secondary | ICD-10-CM | POA: Insufficient documentation

## 2022-11-11 DIAGNOSIS — R5381 Other malaise: Secondary | ICD-10-CM | POA: Insufficient documentation

## 2022-11-11 DIAGNOSIS — Z853 Personal history of malignant neoplasm of breast: Secondary | ICD-10-CM | POA: Diagnosis not present

## 2022-11-11 DIAGNOSIS — R0601 Orthopnea: Secondary | ICD-10-CM | POA: Diagnosis not present

## 2022-11-11 DIAGNOSIS — E119 Type 2 diabetes mellitus without complications: Secondary | ICD-10-CM | POA: Insufficient documentation

## 2022-11-11 DIAGNOSIS — Z743 Need for continuous supervision: Secondary | ICD-10-CM | POA: Diagnosis not present

## 2022-11-11 DIAGNOSIS — I10 Essential (primary) hypertension: Secondary | ICD-10-CM | POA: Diagnosis not present

## 2022-11-11 LAB — URINALYSIS, ROUTINE W REFLEX MICROSCOPIC
Bilirubin Urine: NEGATIVE
Glucose, UA: NEGATIVE mg/dL
Ketones, ur: NEGATIVE mg/dL
Nitrite: NEGATIVE
Protein, ur: 100 mg/dL — AB
RBC / HPF: >50 RBC/hpf (ref 0–5)
Specific Gravity, Urine: 1.011 (ref 1.005–1.030)
WBC, UA: >50 WBC/hpf (ref 0–5)
pH: 6 (ref 5.0–8.0)

## 2022-11-11 LAB — CBC
HCT: 40.3 % (ref 36.0–46.0)
Hemoglobin: 12.9 g/dL (ref 12.0–15.0)
MCH: 29.3 pg (ref 26.0–34.0)
MCHC: 32 g/dL (ref 30.0–36.0)
MCV: 91.4 fL (ref 80.0–100.0)
Platelets: 222 10*3/uL (ref 150–400)
RBC: 4.41 MIL/uL (ref 3.87–5.11)
RDW: 14.6 % (ref 11.5–15.5)
WBC: 7.9 10*3/uL (ref 4.0–10.5)
nRBC: 0 % (ref 0.0–0.2)

## 2022-11-11 LAB — BRAIN NATRIURETIC PEPTIDE: B Natriuretic Peptide: 498 pg/mL — ABNORMAL HIGH (ref 0.0–100.0)

## 2022-11-11 LAB — BASIC METABOLIC PANEL
Anion gap: 9 (ref 5–15)
BUN: 23 mg/dL (ref 8–23)
CO2: 25 mmol/L (ref 22–32)
Calcium: 9.3 mg/dL (ref 8.9–10.3)
Chloride: 100 mmol/L (ref 98–111)
Creatinine, Ser: 1.59 mg/dL — ABNORMAL HIGH (ref 0.44–1.00)
GFR, Estimated: 30 mL/min — ABNORMAL LOW (ref >60)
Glucose, Bld: 136 mg/dL — ABNORMAL HIGH (ref 70–99)
Potassium: 3.7 mmol/L (ref 3.5–5.1)
Sodium: 134 mmol/L — ABNORMAL LOW (ref 135–145)

## 2022-11-11 LAB — TROPONIN I (HIGH SENSITIVITY): Troponin I (High Sensitivity): 10 ng/L (ref <18)

## 2022-11-11 NOTE — ED Triage Notes (Signed)
BIBA from [facility] with c/o sob that is worse laying flat.  Pt seen for same 7/23.  Pt on eliquis and recently diagnosed with a-fib DNR

## 2022-11-11 NOTE — ED Notes (Signed)
Family reports 60mg  Lasix was administered by [facility] PTA

## 2022-11-11 NOTE — Discharge Instructions (Signed)
You were evaluated in the Emergency Department and after careful evaluation, we did not find any emergent condition requiring admission or further testing in the hospital.  Your exam/testing today was overall reassuring.  Symptoms likely due to some continued pulmonary edema.  Recommend sleeping on an incline and continuing your home medications, following up closely with your primary care doctor.  Please return to the Emergency Department if you experience any worsening of your condition.  Thank you for allowing [place] to be a part of your care.

## 2022-11-11 NOTE — ED Provider Notes (Signed)
AP-EMERGENCY DEPT [facility] Emergency Department Provider Note MRN:  [id number]  Arrival date & time: 11/11/22     Chief Complaint   Shortness of Breath   History of Present Illness   [name] Petersen is a 89 y.o. year-old female with a history of diabetes, stroke, A-fib presenting to the ED with chief complaint of shortness of breath.  Shortness of breath this morning when laying flat, malaise.  Denies any pain.  Review of Systems  A thorough review of systems was obtained and all systems are negative except as noted in the HPI and PMH.   Patient's Health History    Past Medical History:  Diagnosis Date   Arthritis    hands   Atherosclerotic PVD with ulceration (HCC) 07/15/2017   RIGHT HEEL   Breast cancer (HCC)    Cancer (HCC)    Complication of anesthesia    lost eyesight for a few days after surgery in the 1950's   Constipation    Diabetes mellitus without complication (HCC)    no medications    Hypertension    Macular degeneration, bilateral    "one eye wet; one eye dry; she gets shots" (07/07/2017)   PAD (peripheral artery disease) (HCC)    Pneumonia    Stroke (HCC)    some trouble swallowing    Past Surgical History:  Procedure Laterality Date   ABDOMINAL AORTOGRAM W/LOWER EXTREMITY N/A 06/16/2017   Procedure: ABDOMINAL AORTOGRAM W/LOWER EXTREMITY;  Surgeon: Fransisco Hertz, MD;  Location: [facility] INVASIVE CV LAB;  Service: Cardiovascular;  Laterality: N/A;  lt extermity   ABDOMINAL HYSTERECTOMY     AMPUTATION Left 07/06/2017   Procedure: AMPUTATION BELOW KNEE LEFT;  Surgeon: Larina Earthly, MD;  Location: [facility] OR;  Service: Vascular;  Laterality: Left;   CATARACT EXTRACTION W/ INTRAOCULAR LENS  IMPLANT, BILATERAL     DILATION AND CURETTAGE OF UTERUS     LOWER EXTREMITY ANGIOGRAPHY N/A 07/14/2017   Procedure: LOWER EXTREMITY ANGIOGRAPHY;  Surgeon: Chuck Hint, MD;  Location: [facility] INVASIVE CV LAB;  Service: Cardiovascular;  Laterality: N/A;    Family  History  Adopted: Yes    Social History   Socioeconomic History   Marital status: Widowed    Spouse name: Not on file   Number of children: Not on file   Years of education: Not on file   Highest education level: Not on file  Occupational History   Not on file  Tobacco Use   Smoking status: Never   Smokeless tobacco: Never  Vaping Use   Vaping status: Never Used  Substance and Sexual Activity   Alcohol use: Never   Drug use: Never   Sexual activity: Not Currently    Comment: intercourse age 79,sexual patrners less than 5   Other Topics Concern   Not on file  Social History Narrative   Not on file   Social Determinants of Health   Financial Resource Strain: Low Risk  (06/29/2022)   Received from [facility], [facility]   Overall Financial Resource Strain (CARDIA)    Difficulty of Paying Living Expenses: Not hard at all  Food Insecurity: No Food Insecurity (11/09/2022)   Hunger Vital Sign    Worried About Running Out of Food in the Last Year: Never true    Ran Out of Food in the Last Year: Never true  Transportation Needs: No Transportation Needs (11/09/2022)   PRAPARE - Administrator, Civil Service (Medical): No  Lack of Transportation (Non-Medical): No  Physical Activity: Inactive (06/29/2022)   Received from [facility], [facility]   Exercise Vital Sign    Days of Exercise per Week: 0 days    Minutes of Exercise per Session: 0 min  Stress: No Stress Concern Present (06/29/2022)   Received from [facility], [facility] of Occupational Health - Occupational Stress Questionnaire    Feeling of Stress : Not at all  Social Connections: Moderately Integrated (06/29/2022)   Received from [facility], [facility]   Social Connection and Isolation Panel [NHANES]    Frequency of Communication with Friends and Family: More than three times a week    Frequency of Social Gatherings with Friends and Family: Twice a  week    Attends Religious Services: More than 4 times per year    Active Member of [employer] or Organizations: Yes    Attends Banker Meetings: More than 4 times per year    Marital Status: Widowed  Intimate Partner Violence: Patient Declined (11/09/2022)   Humiliation, Afraid, Rape, and Kick questionnaire    Fear of Current or Ex-Partner: Patient declined    Emotionally Abused: Patient declined    Physically Abused: Patient declined    Sexually Abused: Patient declined     Physical Exam   Vitals:   11/11/22 0543 11/11/22 0615  BP:  127/81  Pulse: 84 85  Resp: 19 19  Temp:    SpO2: 96% 96%    CONSTITUTIONAL: Chronically ill-appearing, NAD NEURO/PSYCH:  Alert and oriented x 3, no focal deficits EYES:  eyes equal and reactive ENT/NECK:  no LAD, no JVD CARDIO: Regular rate, well-perfused, normal S1 and S2 PULM:  CTAB no wheezing or rhonchi GI/GU:  non-distended, non-tender MSK/SPINE:  No gross deformities, no edema SKIN:  no rash, atraumatic   *Additional and/or pertinent findings included in MDM below  Diagnostic and Interventional Summary    EKG Interpretation Date/Time:  Thursday November 11 2022 05:34:30 EDT Ventricular Rate:  86 PR Interval:    QRS Duration:  87 QT Interval:  351 QTC Calculation: 420 R Axis:   67  Text Interpretation: Atrial fibrillation Ventricular premature complex Low voltage, precordial leads Confirmed by Kennis Carina [phone]) on 11/11/2022 5:42:32 AM       Labs Reviewed  BASIC METABOLIC PANEL - Abnormal; Notable for the following components:      Result Value   Sodium 134 (*)    Glucose, Bld 136 (*)    Creatinine, Ser 1.59 (*)    GFR, Estimated 30 (*)    All other components within normal limits  BRAIN NATRIURETIC PEPTIDE - Abnormal; Notable for the following components:   B Natriuretic Peptide 498.0 (*)    All other components within normal limits  URINALYSIS, ROUTINE W REFLEX MICROSCOPIC - Abnormal; Notable for the following  components:   APPearance HAZY (*)    Hgb urine dipstick LARGE (*)    Protein, ur 100 (*)    Leukocytes,Ua MODERATE (*)    Bacteria, UA RARE (*)    All other components within normal limits  URINE CULTURE  CBC  TROPONIN I (HIGH SENSITIVITY)  TROPONIN I (HIGH SENSITIVITY)    DG Chest Port 1 View  Final Result      Medications - No data to display   Procedures  /  Critical Care Procedures  ED Course and Medical Decision Making  Initial Impression and Ddx Question pulmonary edema  given the reported orthopnea.  Recent admission for similar symptoms, was in A-fib with RVR at that time.  Rate is controlled at this time.  Still in A-fib.  Past medical/surgical history that increases complexity of ED encounter: A-fib, CHF  Interpretation of Diagnostics I personally reviewed the Chest Xray and my interpretation is as follows: Mild pulmonary edema  Labs overall reassuring with no significant blood count or electrolyte disturbance.  Slight elevation in BUN/creatinine.  Troponin negative  Patient Reassessment and Ultimate Disposition/Management     On reassessment patient is feeling much better.  She was given Lasix at the facility prior to transport here and she has produced quite a bit of urine here.  She is now on room air saturations in the mid 90s, no acute distress.  Never had any chest pain.  Daughter at bedside who explains that for some reason her bed was set flat this evening which is abnormal and she thinks this contributed to the presentation.  No great benefit to admission at this time.  Daughter and patient are comfortable with discharge, strict return precautions.  Patient management required discussion with the following services or consulting groups:  None  Complexity of Problems Addressed Acute illness or injury that poses threat of life of bodily function  Additional Data Reviewed and Analyzed Further history obtained from: Further history from spouse/family  member  Additional Factors Impacting ED Encounter Risk Consideration of hospitalization  Elmer Sow. Pilar Plate, MD [facility] Emergency Medicine [facility] [email or web address]  Final Clinical Impressions(s) / ED Diagnoses     ICD-10-CM   1. Orthopnea  R06.01       ED Discharge Orders     None        Discharge Instructions Discussed with and Provided to Patient:     Discharge Instructions      You were evaluated in the Emergency Department and after careful evaluation, we did not find any emergent condition requiring admission or further testing in the hospital.  Your exam/testing today was overall reassuring.  Symptoms likely due to some continued pulmonary edema.  Recommend sleeping on an incline and continuing your home medications, following up closely with your primary care doctor.  Please return to the Emergency Department if you experience any worsening of your condition.  Thank you for allowing [place] to be a part of your care.        Sabas Sous, MD 11/11/22 [phone]

## 2022-11-12 DIAGNOSIS — I1310 Hypertensive heart and chronic kidney disease without heart failure, with stage 1 through stage 4 chronic kidney disease, or unspecified chronic kidney disease: Secondary | ICD-10-CM | POA: Diagnosis not present

## 2022-11-12 DIAGNOSIS — E1151 Type 2 diabetes mellitus with diabetic peripheral angiopathy without gangrene: Secondary | ICD-10-CM | POA: Diagnosis not present

## 2022-11-12 DIAGNOSIS — N1832 Chronic kidney disease, stage 3b: Secondary | ICD-10-CM | POA: Diagnosis not present

## 2022-11-12 DIAGNOSIS — I69391 Dysphagia following cerebral infarction: Secondary | ICD-10-CM | POA: Diagnosis not present

## 2022-11-12 DIAGNOSIS — E1152 Type 2 diabetes mellitus with diabetic peripheral angiopathy with gangrene: Secondary | ICD-10-CM | POA: Diagnosis not present

## 2022-11-12 DIAGNOSIS — E1122 Type 2 diabetes mellitus with diabetic chronic kidney disease: Secondary | ICD-10-CM | POA: Diagnosis not present

## 2022-11-15 DIAGNOSIS — I69391 Dysphagia following cerebral infarction: Secondary | ICD-10-CM | POA: Diagnosis not present

## 2022-11-15 DIAGNOSIS — I130 Hypertensive heart and chronic kidney disease with heart failure and stage 1 through stage 4 chronic kidney disease, or unspecified chronic kidney disease: Secondary | ICD-10-CM | POA: Diagnosis not present

## 2022-11-15 DIAGNOSIS — I509 Heart failure, unspecified: Secondary | ICD-10-CM | POA: Diagnosis not present

## 2022-11-15 DIAGNOSIS — E1151 Type 2 diabetes mellitus with diabetic peripheral angiopathy without gangrene: Secondary | ICD-10-CM | POA: Diagnosis not present

## 2022-11-15 DIAGNOSIS — R0609 Other forms of dyspnea: Secondary | ICD-10-CM | POA: Diagnosis not present

## 2022-11-15 DIAGNOSIS — I1310 Hypertensive heart and chronic kidney disease without heart failure, with stage 1 through stage 4 chronic kidney disease, or unspecified chronic kidney disease: Secondary | ICD-10-CM | POA: Diagnosis not present

## 2022-11-15 DIAGNOSIS — E1122 Type 2 diabetes mellitus with diabetic chronic kidney disease: Secondary | ICD-10-CM | POA: Diagnosis not present

## 2022-11-15 DIAGNOSIS — I739 Peripheral vascular disease, unspecified: Secondary | ICD-10-CM | POA: Diagnosis not present

## 2022-11-15 DIAGNOSIS — E1152 Type 2 diabetes mellitus with diabetic peripheral angiopathy with gangrene: Secondary | ICD-10-CM | POA: Diagnosis not present

## 2022-11-15 DIAGNOSIS — I482 Chronic atrial fibrillation, unspecified: Secondary | ICD-10-CM | POA: Diagnosis not present

## 2022-11-15 DIAGNOSIS — N1832 Chronic kidney disease, stage 3b: Secondary | ICD-10-CM | POA: Diagnosis not present

## 2022-11-17 ENCOUNTER — Telehealth: Payer: Self-pay | Admitting: *Deleted

## 2022-11-17 ENCOUNTER — Observation Stay (HOSPITAL_COMMUNITY): Payer: Medicare Other

## 2022-11-17 ENCOUNTER — Inpatient Hospital Stay (HOSPITAL_COMMUNITY)
Admission: EM | Admit: 2022-11-17 | Discharge: 2022-11-23 | Disposition: A | Discharge status: Skilled Nursing Facility | Payer: Medicare Other | Attending: Internal Medicine | Admitting: Internal Medicine

## 2022-11-17 ENCOUNTER — Encounter (HOSPITAL_COMMUNITY): Payer: Self-pay | Admitting: Pharmacy Technician

## 2022-11-17 ENCOUNTER — Other Ambulatory Visit: Payer: Self-pay

## 2022-11-17 DIAGNOSIS — C50911 Malignant neoplasm of unspecified site of right female breast: Secondary | ICD-10-CM | POA: Diagnosis not present

## 2022-11-17 DIAGNOSIS — Z961 Presence of intraocular lens: Secondary | ICD-10-CM | POA: Diagnosis present

## 2022-11-17 DIAGNOSIS — I5032 Chronic diastolic (congestive) heart failure: Secondary | ICD-10-CM | POA: Diagnosis present

## 2022-11-17 DIAGNOSIS — Z79811 Long term (current) use of aromatase inhibitors: Secondary | ICD-10-CM | POA: Diagnosis not present

## 2022-11-17 DIAGNOSIS — G2581 Restless legs syndrome: Secondary | ICD-10-CM | POA: Diagnosis present

## 2022-11-17 DIAGNOSIS — Z8673 Personal history of transient ischemic attack (TIA), and cerebral infarction without residual deficits: Secondary | ICD-10-CM | POA: Diagnosis not present

## 2022-11-17 DIAGNOSIS — Z79899 Other long term (current) drug therapy: Secondary | ICD-10-CM

## 2022-11-17 DIAGNOSIS — N183 Chronic kidney disease, stage 3 unspecified: Secondary | ICD-10-CM | POA: Diagnosis not present

## 2022-11-17 DIAGNOSIS — I700 Atherosclerosis of aorta: Secondary | ICD-10-CM | POA: Diagnosis not present

## 2022-11-17 DIAGNOSIS — N1832 Chronic kidney disease, stage 3b: Secondary | ICD-10-CM | POA: Diagnosis not present

## 2022-11-17 DIAGNOSIS — G8191 Hemiplegia, unspecified affecting right dominant side: Secondary | ICD-10-CM | POA: Diagnosis present

## 2022-11-17 DIAGNOSIS — E782 Mixed hyperlipidemia: Secondary | ICD-10-CM | POA: Diagnosis not present

## 2022-11-17 DIAGNOSIS — Z743 Need for continuous supervision: Secondary | ICD-10-CM | POA: Diagnosis not present

## 2022-11-17 DIAGNOSIS — I6381 Other cerebral infarction due to occlusion or stenosis of small artery: Secondary | ICD-10-CM | POA: Diagnosis not present

## 2022-11-17 DIAGNOSIS — I4892 Unspecified atrial flutter: Secondary | ICD-10-CM | POA: Diagnosis not present

## 2022-11-17 DIAGNOSIS — Z9221 Personal history of antineoplastic chemotherapy: Secondary | ICD-10-CM

## 2022-11-17 DIAGNOSIS — K5900 Constipation, unspecified: Secondary | ICD-10-CM | POA: Diagnosis present

## 2022-11-17 DIAGNOSIS — E785 Hyperlipidemia, unspecified: Secondary | ICD-10-CM | POA: Diagnosis present

## 2022-11-17 DIAGNOSIS — R0600 Dyspnea, unspecified: Secondary | ICD-10-CM | POA: Diagnosis not present

## 2022-11-17 DIAGNOSIS — I4891 Unspecified atrial fibrillation: Secondary | ICD-10-CM | POA: Diagnosis not present

## 2022-11-17 DIAGNOSIS — Z888 Allergy status to other drugs, medicaments and biological substances status: Secondary | ICD-10-CM | POA: Diagnosis not present

## 2022-11-17 DIAGNOSIS — E1151 Type 2 diabetes mellitus with diabetic peripheral angiopathy without gangrene: Secondary | ICD-10-CM | POA: Diagnosis not present

## 2022-11-17 DIAGNOSIS — I4819 Other persistent atrial fibrillation: Secondary | ICD-10-CM | POA: Diagnosis not present

## 2022-11-17 DIAGNOSIS — R000 Tachycardia, unspecified: Secondary | ICD-10-CM | POA: Diagnosis not present

## 2022-11-17 DIAGNOSIS — I639 Cerebral infarction, unspecified: Secondary | ICD-10-CM | POA: Diagnosis not present

## 2022-11-17 DIAGNOSIS — I10 Essential (primary) hypertension: Secondary | ICD-10-CM | POA: Diagnosis not present

## 2022-11-17 DIAGNOSIS — I499 Cardiac arrhythmia, unspecified: Secondary | ICD-10-CM | POA: Diagnosis not present

## 2022-11-17 DIAGNOSIS — E1140 Type 2 diabetes mellitus with diabetic neuropathy, unspecified: Secondary | ICD-10-CM | POA: Diagnosis not present

## 2022-11-17 DIAGNOSIS — I130 Hypertensive heart and chronic kidney disease with heart failure and stage 1 through stage 4 chronic kidney disease, or unspecified chronic kidney disease: Secondary | ICD-10-CM | POA: Diagnosis present

## 2022-11-17 DIAGNOSIS — E1122 Type 2 diabetes mellitus with diabetic chronic kidney disease: Secondary | ICD-10-CM | POA: Diagnosis present

## 2022-11-17 DIAGNOSIS — Z7901 Long term (current) use of anticoagulants: Secondary | ICD-10-CM | POA: Diagnosis not present

## 2022-11-17 DIAGNOSIS — J9811 Atelectasis: Secondary | ICD-10-CM | POA: Diagnosis not present

## 2022-11-17 DIAGNOSIS — Z89512 Acquired absence of left leg below knee: Secondary | ICD-10-CM | POA: Diagnosis not present

## 2022-11-17 DIAGNOSIS — Z515 Encounter for palliative care: Secondary | ICD-10-CM | POA: Diagnosis not present

## 2022-11-17 DIAGNOSIS — G546 Phantom limb syndrome with pain: Secondary | ICD-10-CM | POA: Diagnosis not present

## 2022-11-17 DIAGNOSIS — Z90710 Acquired absence of both cervix and uterus: Secondary | ICD-10-CM

## 2022-11-17 DIAGNOSIS — H3530 Unspecified macular degeneration: Secondary | ICD-10-CM | POA: Diagnosis present

## 2022-11-17 DIAGNOSIS — Z66 Do not resuscitate: Secondary | ICD-10-CM | POA: Diagnosis not present

## 2022-11-17 DIAGNOSIS — R0689 Other abnormalities of breathing: Secondary | ICD-10-CM | POA: Diagnosis not present

## 2022-11-17 DIAGNOSIS — I5033 Acute on chronic diastolic (congestive) heart failure: Secondary | ICD-10-CM | POA: Diagnosis not present

## 2022-11-17 DIAGNOSIS — R29711 NIHSS score 11: Secondary | ICD-10-CM | POA: Diagnosis present

## 2022-11-17 DIAGNOSIS — Z7189 Other specified counseling: Secondary | ICD-10-CM | POA: Diagnosis not present

## 2022-11-17 HISTORY — DX: Unspecified atrial fibrillation: I48.91

## 2022-11-17 LAB — COMPREHENSIVE METABOLIC PANEL
ALT: 18 U/L (ref 0–44)
AST: 23 U/L (ref 15–41)
Albumin: 3.2 g/dL — ABNORMAL LOW (ref 3.5–5.0)
Alkaline Phosphatase: 75 U/L (ref 38–126)
Anion gap: 9 (ref 5–15)
BUN: 15 mg/dL (ref 8–23)
CO2: 26 mmol/L (ref 22–32)
Calcium: 8.7 mg/dL — ABNORMAL LOW (ref 8.9–10.3)
Chloride: 101 mmol/L (ref 98–111)
Creatinine, Ser: 1.27 mg/dL — ABNORMAL HIGH (ref 0.44–1.00)
GFR, Estimated: 39 mL/min — ABNORMAL LOW (ref >60)
Glucose, Bld: 183 mg/dL — ABNORMAL HIGH (ref 70–99)
Potassium: 3.8 mmol/L (ref 3.5–5.1)
Sodium: 136 mmol/L (ref 135–145)
Total Bilirubin: 0.7 mg/dL (ref 0.3–1.2)
Total Protein: 5.9 g/dL — ABNORMAL LOW (ref 6.5–8.1)

## 2022-11-17 LAB — CBC WITH DIFFERENTIAL/PLATELET
Abs Immature Granulocytes: 0.02 10*3/uL (ref 0.00–0.07)
Basophils Absolute: 0 10*3/uL (ref 0.0–0.1)
Basophils Relative: 0 %
Eosinophils Absolute: 0.1 10*3/uL (ref 0.0–0.5)
Eosinophils Relative: 2 %
HCT: 39 % (ref 36.0–46.0)
Hemoglobin: 12.2 g/dL (ref 12.0–15.0)
Immature Granulocytes: 0 %
Lymphocytes Relative: 23 %
Lymphs Abs: 1.4 10*3/uL (ref 0.7–4.0)
MCH: 28.7 pg (ref 26.0–34.0)
MCHC: 31.3 g/dL (ref 30.0–36.0)
MCV: 91.8 fL (ref 80.0–100.0)
Monocytes Absolute: 0.8 10*3/uL (ref 0.1–1.0)
Monocytes Relative: 13 %
Neutro Abs: 3.8 10*3/uL (ref 1.7–7.7)
Neutrophils Relative %: 62 %
Platelets: 270 10*3/uL (ref 150–400)
RBC: 4.25 MIL/uL (ref 3.87–5.11)
RDW: 14.6 % (ref 11.5–15.5)
WBC: 6.2 10*3/uL (ref 4.0–10.5)
nRBC: 0 % (ref 0.0–0.2)

## 2022-11-17 LAB — TSH: TSH: 1.171 u[IU]/mL (ref 0.350–4.500)

## 2022-11-17 LAB — BRAIN NATRIURETIC PEPTIDE: B Natriuretic Peptide: 244 pg/mL — ABNORMAL HIGH (ref 0.0–100.0)

## 2022-11-17 LAB — MRSA NEXT GEN BY PCR, NASAL: MRSA by PCR Next Gen: NOT DETECTED

## 2022-11-17 LAB — MAGNESIUM: Magnesium: 2 mg/dL (ref 1.7–2.4)

## 2022-11-17 MED ORDER — ACETAMINOPHEN 325 MG PO TABS
650.0000 mg | ORAL_TABLET | ORAL | Status: DC | PRN
  Administered 2022-11-19 – 2022-11-22 (×6): 650 mg via ORAL
  Filled 2022-11-17 (×4): qty 2

## 2022-11-17 MED ORDER — DILTIAZEM HCL-DEXTROSE 125-5 MG/125ML-% IV SOLN (PREMIX)
5.0000 mg/h | INTRAVENOUS | Status: DC
  Administered 2022-11-17: 5 mg/h via INTRAVENOUS
  Administered 2022-11-18: 15 mg/h via INTRAVENOUS
  Filled 2022-11-17 (×3): qty 125

## 2022-11-17 MED ORDER — APIXABAN 5 MG PO TABS
5.0000 mg | ORAL_TABLET | Freq: Two times a day (BID) | ORAL | Status: DC
  Administered 2022-11-17 – 2022-11-23 (×12): 5 mg via ORAL
  Filled 2022-11-17 (×12): qty 1

## 2022-11-17 MED ORDER — ONDANSETRON HCL 4 MG/2ML IJ SOLN: 4.0000 mg | Freq: Four times a day (QID) | INTRAMUSCULAR | Status: DC | PRN

## 2022-11-17 MED ORDER — POTASSIUM CHLORIDE CRYS ER 20 MEQ PO TBCR
40.0000 meq | EXTENDED_RELEASE_TABLET | Freq: Once | ORAL | Status: AC
  Administered 2022-11-17: 40 meq via ORAL
  Filled 2022-11-17: qty 2

## 2022-11-17 MED ORDER — DILTIAZEM HCL 60 MG PO TABS
60.0000 mg | ORAL_TABLET | Freq: Three times a day (TID) | ORAL | Status: DC
  Filled 2022-11-17 (×2): qty 1

## 2022-11-17 MED ORDER — VITAMIN B-12 1000 MCG PO TABS
1000.0000 ug | ORAL_TABLET | Freq: Every day | ORAL | Status: DC
  Administered 2022-11-18 – 2022-11-20 (×3): 1000 ug via ORAL
  Filled 2022-11-17 (×3): qty 1

## 2022-11-17 MED ORDER — ADULT MULTIVITAMIN W/MINERALS CH
1.0000 | ORAL_TABLET | Freq: Every day | ORAL | Status: DC
  Administered 2022-11-18 – 2022-11-20 (×3): 1 via ORAL
  Filled 2022-11-17 (×3): qty 1

## 2022-11-17 MED ORDER — LETROZOLE 2.5 MG PO TABS
2.5000 mg | ORAL_TABLET | Freq: Every day | ORAL | Status: DC
  Administered 2022-11-18 – 2022-11-20 (×3): 2.5 mg via ORAL
  Filled 2022-11-17 (×8): qty 1

## 2022-11-17 MED ORDER — VITAMIN D 25 MCG (1000 UNIT) PO TABS
2000.0000 [IU] | ORAL_TABLET | Freq: Every day | ORAL | Status: DC
  Administered 2022-11-18 – 2022-11-20 (×3): 2000 [IU] via ORAL
  Filled 2022-11-17 (×4): qty 2

## 2022-11-17 MED ORDER — DILTIAZEM LOAD VIA INFUSION
10.0000 mg | Freq: Once | INTRAVENOUS | Status: AC
  Administered 2022-11-17: 10 mg via INTRAVENOUS
  Filled 2022-11-17: qty 10

## 2022-11-17 MED ORDER — GABAPENTIN 100 MG PO CAPS
100.0000 mg | ORAL_CAPSULE | Freq: Every day | ORAL | Status: DC
  Administered 2022-11-18 – 2022-11-21 (×4): 100 mg via ORAL
  Filled 2022-11-17 (×4): qty 1

## 2022-11-17 MED ORDER — PRAVASTATIN SODIUM 10 MG PO TABS
20.0000 mg | ORAL_TABLET | Freq: Every day | ORAL | Status: DC
  Administered 2022-11-17 – 2022-11-23 (×7): 20 mg via ORAL
  Filled 2022-11-17 (×7): qty 2

## 2022-11-17 MED ORDER — FUROSEMIDE 20 MG PO TABS
20.0000 mg | ORAL_TABLET | Freq: Every day | ORAL | Status: DC
  Administered 2022-11-17 – 2022-11-23 (×7): 20 mg via ORAL
  Filled 2022-11-17 (×7): qty 1

## 2022-11-17 MED ORDER — DIGOXIN 0.25 MG/ML IJ SOLN
0.2500 mg | Freq: Once | INTRAMUSCULAR | Status: AC
  Administered 2022-11-17: 0.25 mg via INTRAVENOUS
  Filled 2022-11-17: qty 2

## 2022-11-17 MED ORDER — CHLORHEXIDINE GLUCONATE CLOTH 2 % EX PADS
6.0000 | MEDICATED_PAD | Freq: Every day | CUTANEOUS | Status: DC
  Administered 2022-11-18 – 2022-11-23 (×6): 6 via TOPICAL

## 2022-11-17 NOTE — H&P (Addendum)
TRH H&P   Patient Demographics:    [name] Petersen, is a 89 y.o. female  MRN: [id number]   DOB - [birth year]  Admit Date - 11/17/2022  Outpatient Primary MD for the patient is Pcp, No  Referring MD/NP/PA: Dr Posey Rea  Patient coming from: [facility]  Chief Complaint  Patient presents with   Shortness of Breath      HPI:    [name] Petersen  is a 89 y.o. female, with medical history significant for, stroke, peripheral artery disease, diabetes mellitus, left BKA.  With recent hospitalization due to A-fib with RVR, she is discharged on 11/10/2022, she was discharged on Cardizem CD to 40 mg oral daily, and her metoprolol has been discontinued as she reported has been giving her fatigue. -Patient was sent to ED today for uncontrolled heart rate, she was noted to have heart rate in the 140s-150s in the facility, so she was sent to ED for further evaluation, any chest pain, but she does report some dyspnea, but she has no hypoxia, no leg edema, no oxygen requirement, she denies fever, cough or chills -In ED patient was noted to have heart rate in the 150s, she was started on Cardizem drip, workup significant for stable creatinine at 1.27, potassium of 3.8, chest x-ray still pending, BNP still pending, [facility] hospitalist consulted to admit     Review of systems:     A full 10 point Review of Systems was done, except as stated above, all other Review of Systems were negative.   With Past History of the following :    Past Medical History:  Diagnosis Date   A-fib (HCC)    Arthritis    hands   Atherosclerotic PVD with ulceration (HCC) 07/15/2017   RIGHT HEEL   Breast cancer (HCC)    Cancer (HCC)    Complication of anesthesia    lost eyesight for a few days after surgery in the 1950's   Constipation    Diabetes mellitus without complication (HCC)    no medications    Hypertension     Macular degeneration, bilateral    "one eye wet; one eye dry; she gets shots" (07/07/2017)   PAD (peripheral artery disease) (HCC)    Pneumonia    Stroke (HCC)    some trouble swallowing      Past Surgical History:  Procedure Laterality Date   ABDOMINAL AORTOGRAM W/LOWER EXTREMITY N/A 06/16/2017   Procedure: ABDOMINAL AORTOGRAM W/LOWER EXTREMITY;  Surgeon: Fransisco Hertz, MD;  Location: [facility] INVASIVE CV LAB;  Service: Cardiovascular;  Laterality: N/A;  lt extermity   ABDOMINAL HYSTERECTOMY     AMPUTATION Left 07/06/2017   Procedure: AMPUTATION BELOW KNEE LEFT;  Surgeon: Larina Earthly, MD;  Location: [facility] OR;  Service: Vascular;  Laterality: Left;   CATARACT EXTRACTION W/ INTRAOCULAR LENS  IMPLANT, BILATERAL     DILATION AND CURETTAGE OF UTERUS  LOWER EXTREMITY ANGIOGRAPHY N/A 07/14/2017   Procedure: LOWER EXTREMITY ANGIOGRAPHY;  Surgeon: Chuck Hint, MD;  Location: [facility] INVASIVE CV LAB;  Service: Cardiovascular;  Laterality: N/A;      Social History:     Social History   Tobacco Use   Smoking status: Never   Smokeless tobacco: Never  Substance Use Topics   Alcohol use: Never       Family History :     Family History  Adopted: Yes     Home Medications:   Prior to Admission medications   Medication Sig Start Date End Date Taking? Authorizing Provider  acetaminophen (TYLENOL) 325 MG tablet Take 3 tablets (975 mg total) by mouth at bedtime as needed for mild pain. 09/12/22   Johnson, Clanford L, MD  apixaban (ELIQUIS) 5 MG TABS tablet Take 1 tablet (5 mg total) by mouth 2 (two) times daily. 09/12/22   Johnson, Clanford L, MD  beta carotene w/minerals (OCUVITE) tablet Take 1 tablet by mouth daily.    [provider]  calcium carbonate (TUMS - DOSED IN MG ELEMENTAL CALCIUM) 500 MG chewable tablet Chew 2 tablets by mouth as needed for indigestion or heartburn.    [provider]  Cholecalciferol (VITAMIN D3) 50 MCG (2000 UT) capsule Take 2,000 Units by  mouth daily.    [provider]  cyanocobalamin 1000 MCG tablet Take 1,000 mcg by mouth daily.    [provider]  diltiazem (CARDIZEM CD) 180 MG 24 hr capsule Take 1 capsule (180 mg total) by mouth daily. 11/10/22 11/10/23  Shon Hale, MD  furosemide (LASIX) 20 MG tablet Take 20 mg by mouth daily. Edema    [provider]  gabapentin (NEURONTIN) 100 MG capsule Take 100 mg by mouth daily.    [provider]  hydrocortisone (ANUSOL-HC) 2.5 % rectal cream Place 1 Application rectally 2 (two) times daily.    [provider]  letrozole (FEMARA) 2.5 MG tablet Take 2.5 mg by mouth daily.    [provider]  lovastatin (MEVACOR) 20 MG tablet Take 20 mg by mouth daily at 6 PM. 11/30/18   [provider]  Menthol, Topical Analgesic, (BIOFREEZE) 4 % GEL Apply topically as needed.    [provider]  Multiple Vitamins-Minerals (MULTIVITAMIN WITH MINERALS) tablet Take 1 tablet by mouth daily.    [provider]  nystatin ointment (MYCOSTATIN) Apply 1 Application topically 2 (two) times daily. 09/07/22   Aida Raider, NP  phenylephrine-shark liver oil-mineral oil-petrolatum (PREPARATION H) 0.25-3-14-71.9 % rectal ointment Place 1 Application rectally 2 (two) times daily. Apply internally for 10 days and then as needed. 09/07/22   Aida Raider, NP  trastuzumab 4 mg/kg in sodium chloride 0.9 % 250 mL Inject 4 mg/kg into the vein once.    [provider]     Allergies:     Allergies  Allergen Reactions   Prednisone Other (See Comments)    Other Reaction(s): Fatique   Scopace [Scopolamine] Other (See Comments)    Hallucinations     Physical Exam:   Vitals  Blood pressure 126/81, pulse (!) 111, temperature 98.7 F (37.1 C), resp. rate (!) 25, SpO2 93%.   1. General well-developed elderly female, laying in bed, no apparent distress  2. Normal affect and insight, Not Suicidal or Homicidal, Awake  Alert, Oriented X 3.  3. No F.N deficits, ALL C.Nerves Intact, Strength 5/5 all 4 extremities, Sensation intact all 4 extremities, Plantars down going.  4. Ears  and Eyes appear Normal, Conjunctivae clear, PERRLA. Moist Oral Mucosa.  5. Supple Neck, No JVD, No cervical lymphadenopathy appriciated, No Carotid Bruits.  6. Symmetrical Chest wall movement, Good air movement bilaterally, rales present at the bases  7.  irregular, no Gallops, Rubs or Murmurs, No Parasternal Heave.  8. Positive Bowel Sounds, Abdomen Soft, No tenderness, No organomegaly appriciated,No rebound -guarding or rigidity.  9.  No Cyanosis, Normal Skin Turgor, No Skin Rash or Bruise.  10. Good muscle tone,  joints appear normal , no effusions, Normal ROM.  Left BKA     Data Review:    CBC Recent Labs  Lab 11/11/22 0630 11/17/22 1359  WBC 7.9 6.2  HGB 12.9 12.2  HCT 40.3 39.0  PLT 222 270  MCV 91.4 91.8  MCH 29.3 28.7  MCHC 32.0 31.3  RDW 14.6 14.6  LYMPHSABS  --  1.4  MONOABS  --  0.8  EOSABS  --  0.1  BASOSABS  --  0.0   ------------------------------------------------------------------------------------------------------------------  Chemistries  Recent Labs  Lab 11/11/22 0630 11/17/22 1359  NA 134* 136  K 3.7 3.8  CL 100 101  CO2 25 26  GLUCOSE 136* 183*  BUN 23 15  CREATININE 1.59* 1.27*  CALCIUM 9.3 8.7*  AST  --  23  ALT  --  18  ALKPHOS  --  75  BILITOT  --  0.7   ------------------------------------------------------------------------------------------------------------------ estimated creatinine clearance is 26.3 mL/min (A) (by C-G formula based on SCr of 1.27 mg/dL (H)). ------------------------------------------------------------------------------------------------------------------ No results for input(s): "TSH", "T4TOTAL", "T3FREE", "THYROIDAB" in the last 72 hours.  Invalid input(s): "FREET3"  Coagulation profile No results for input(s): "INR", "PROTIME" in the last  168 hours. ------------------------------------------------------------------------------------------------------------------- No results for input(s): "DDIMER" in the last 72 hours. -------------------------------------------------------------------------------------------------------------------  Cardiac Enzymes No results for input(s): "CKMB", "TROPONINI", "MYOGLOBIN" in the last 168 hours.  Invalid input(s): "CK" ------------------------------------------------------------------------------------------------------------------    Component Value Date/Time   BNP 498.0 (H) 11/11/2022 0630     ---------------------------------------------------------------------------------------------------------------  Urinalysis    Component Value Date/Time   COLORURINE YELLOW 11/11/2022 0605   APPEARANCEUR HAZY (A) 11/11/2022 0605   LABSPEC 1.011 11/11/2022 0605   PHURINE 6.0 11/11/2022 0605   GLUCOSEU NEGATIVE 11/11/2022 0605   HGBUR LARGE (A) 11/11/2022 0605   BILIRUBINUR NEGATIVE 11/11/2022 0605   KETONESUR NEGATIVE 11/11/2022 0605   PROTEINUR 100 (A) 11/11/2022 0605   UROBILINOGEN 0.2 04/27/2014 1846   NITRITE NEGATIVE 11/11/2022 0605   LEUKOCYTESUR MODERATE (A) 11/11/2022 0605    ----------------------------------------------------------------------------------------------------------------   Imaging Results:    No results found.  EkG is pending, but on telemetry heart rate appears to be a fib in the 120s  Assessment & Plan:    Principal Problem:   Atrial fibrillation with RVR (HCC) Active Problems:   Essential hypertension   Stage 3 chronic kidney disease (HCC)   Hyperlipidemia    A-fib with RVR -With recent hospitalization for the same, on discharge her metoprolol has been discontinued (as patient reported caused her fatigue), and she was started on Cardizem CD 240 mg oral daily which appears she has been taking on her medication list. -Rate in the 140s to 150s while  in ED, continue with Cardizem drip for now, I will start on short acting Cardizem 60 mg p.o. every 8 hours, hoping can DC the drip by a.m., -Likely she will need higher dose of Cardizem CD upon discharge if blood pressure tolerates, as well as needed short acting Cardizem for uncontrolled events -Continue with  Eliquis -TSH within normal limit in May, I will repeat -Serum 3.8, will replete, will check magnesium level as well -Recent echo with EF 60%, with grade 2 diastolic dysfunction  Diastolic CHF unspecified -X-ray still pending, BNP still pending, she presents with dyspnea -Continue with home dose Lasix 20 mg oral daily  CKD, stage 3 -Stable, at baseline  Hypertension -She is on Cardizem CD at home, will need to be resumed at a higher dose for better heart rate control  History of CVA  -Continue with Eliquis  Type 2 diabetes mellitus with neuropathy -Controlled with diet   Hyperlipidemia -Continue with statin   Phantom limb pain s/p left BKA -Continue with gabapentin   Right breast cancer - Stage IB triple positive - continue with  letrozole   Daughters reports patient was seen by [facility] today, apparently SNF physician had made referral to palliative/with oral care, and etc. care nurse who sent patient to ED for evaluation, will consult palliative medicine as he did not get to talk to them before [facility] started skin care.  DVT Prophylaxis on Eliquis  AM Labs Ordered, also please review Full Orders  Family Communication: Admission, patients condition and plan of care including tests being ordered have been discussed with the patient and daughters at bedside who indicate understanding and agree with the plan and Code Status.  Code Status DNR, confirmed by patient and family at bedside, she has DNR form  Likely DC to back to [facility]  Condition GUARDED  Consults called: Cardiology by ED  Admission status: Observation  Time spent in minutes : 70  minutes   Huey Bienenstock M.D on 11/17/2022 at 3:32 PM   [facility] - Office  [phone]

## 2022-11-17 NOTE — Telephone Encounter (Signed)
Transition Care Management Unsuccessful Follow-up Telephone Call  Date of discharge and from where:  [name] Petersen 11/11/2022  Attempts:  2nd Attempt  Reason for unsuccessful TCM follow-up call:  No answer/busy

## 2022-11-17 NOTE — Telephone Encounter (Signed)
Transition Care Management Unsuccessful Follow-up Telephone Call  Date of discharge and from where:  [name] Petersen 11/11/2022  Attempts:  1st Attempt  Reason for unsuccessful TCM follow-up call:  Left voice message

## 2022-11-17 NOTE — ED Triage Notes (Signed)
Pt bib ems from [facility]  with complaints of shob. Pt found to have irregular pulse, found to be in afib with rate 110-160. Pt takes eliquis.

## 2022-11-17 NOTE — ED Provider Notes (Signed)
[facility]IVE CARE UNIT Provider Note  CSN: [id number] Arrival date & time: 11/17/22 1332  Chief Complaint(s) Shortness of Breath  HPI [name] Petersen is a 89 y.o. female with PMH CVA, PAD, left BKA, T2DM, A-fib on Eliquis who presents emergency department for evaluation of rapid heart rate.  History obtained from patient's daughter who states that the patient really has been asymptomatic and her nursing facility today but vitals were checked and the patient had elevated heart rates of 110-160.  Patient arrives in the emergency department with no complaints including no chest pain, shortness of breath, abdominal pain, nausea, vomiting or other systemic symptoms.  Chief complaint does state shortness of breath but patient is not complaining of shortness of breath here in the emergency department and is saturating 95% on room air.    Past Medical History Past Medical History:  Diagnosis Date   A-fib [facility])    Arthritis    hands   Atherosclerotic PVD with ulceration (HCC) 07/15/2017   RIGHT HEEL   Breast cancer (HCC)    Cancer (HCC)    Complication of anesthesia    lost eyesight for a few days after surgery in the 1950's   Constipation    Diabetes mellitus without complication (HCC)    no medications    Hypertension    Macular degeneration, bilateral    "one eye wet; one eye dry; she gets shots" (07/07/2017)   PAD (peripheral artery disease) (HCC)    Pneumonia    Stroke [facility])    some trouble swallowing   Patient Active Problem List   Diagnosis Date Noted   Atrial flutter with rapid ventricular response (HCC) 11/10/2022   Acute diastolic (congestive) heart failure (HCC) 11/09/2022   Atrial fibrillation with RVR (HCC) 09/11/2022   History of stroke 08/21/2020   Hyperlipidemia 08/21/2020   Urinary retention    Phantom limb pain (HCC)    Unilateral complete BKA, left, sequela (HCC)    Slow transit constipation    Atherosclerotic PVD with ulceration (HCC) 07/14/2017    Pressure injury of right heel, stage 3 (HCC)    Sleep disturbance    Poor nutrition    Hypokalemia    Drug-induced constipation    Essential hypertension    Stage 3 chronic kidney disease (HCC)    Postoperative pain    Type 2 diabetes mellitus with obesity (HCC)    S/P below knee amputation, left (HCC) 07/10/2017   PAD (peripheral artery disease) (HCC) 07/06/2017   Atherosclerosis of native arteries of the extremities with ulceration (HCC) 06/16/2017   Home Medication(s) Prior to Admission medications   Medication Sig Start Date End Date Taking? Authorizing Provider  acetaminophen (TYLENOL) 325 MG tablet Take 3 tablets (975 mg total) by mouth at bedtime as needed for mild pain. 09/12/22   Johnson, Clanford L, MD  apixaban (ELIQUIS) 5 MG TABS tablet Take 1 tablet (5 mg total) by mouth 2 (two) times daily. 09/12/22   Johnson, Clanford L, MD  beta carotene w/minerals (OCUVITE) tablet Take 1 tablet by mouth daily.    [provider]  calcium carbonate (TUMS - DOSED IN MG ELEMENTAL CALCIUM) 500 MG chewable tablet Chew 2 tablets by mouth as needed for indigestion or heartburn.    [provider]  Cholecalciferol (VITAMIN D3) 50 MCG (2000 UT) capsule Take 2,000 Units by mouth daily.    [provider]  cyanocobalamin 1000 MCG tablet Take 1,000 mcg by mouth daily.    [provider]  diltiazem (CARDIZEM CD) 180 MG 24 hr capsule Take 1 capsule (180 mg total) by mouth daily. 11/10/22 11/10/23  Shon Hale, MD  furosemide (LASIX) 20 MG tablet Take 20 mg by mouth daily. Edema    [provider]  gabapentin (NEURONTIN) 100 MG capsule Take 100 mg by mouth daily.    [provider]  hydrocortisone (ANUSOL-HC) 2.5 % rectal cream Place 1 Application rectally 2 (two) times daily.    [provider]  letrozole (FEMARA) 2.5 MG tablet Take 2.5 mg by mouth daily.    [provider]  lovastatin (MEVACOR) 20 MG tablet Take 20 mg by mouth  daily at 6 PM. 11/30/18   [provider]  Menthol, Topical Analgesic, (BIOFREEZE) 4 % GEL Apply topically as needed.    [provider]  Multiple Vitamins-Minerals (MULTIVITAMIN WITH MINERALS) tablet Take 1 tablet by mouth daily.    [provider]  nystatin ointment (MYCOSTATIN) Apply 1 Application topically 2 (two) times daily. 09/07/22   Aida Raider, NP  phenylephrine-shark liver oil-mineral oil-petrolatum (PREPARATION H) 0.25-3-14-71.9 % rectal ointment Place 1 Application rectally 2 (two) times daily. Apply internally for 10 days and then as needed. 09/07/22   Aida Raider, NP  trastuzumab 4 mg/kg in sodium chloride 0.9 % 250 mL Inject 4 mg/kg into the vein once.    [provider]                                                                                                                                    Past Surgical History Past Surgical History:  Procedure Laterality Date   ABDOMINAL AORTOGRAM W/LOWER EXTREMITY N/A 06/16/2017   Procedure: ABDOMINAL AORTOGRAM W/LOWER EXTREMITY;  Surgeon: Fransisco Hertz, MD;  Location: [facility] INVASIVE CV LAB;  Service: Cardiovascular;  Laterality: N/A;  lt extermity   ABDOMINAL HYSTERECTOMY     AMPUTATION Left 07/06/2017   Procedure: AMPUTATION BELOW KNEE LEFT;  Surgeon: Larina Earthly, MD;  Location: [facility] OR;  Service: Vascular;  Laterality: Left;   CATARACT EXTRACTION W/ INTRAOCULAR LENS  IMPLANT, BILATERAL     DILATION AND CURETTAGE OF UTERUS     LOWER EXTREMITY ANGIOGRAPHY N/A 07/14/2017   Procedure: LOWER EXTREMITY ANGIOGRAPHY;  Surgeon: Chuck Hint, MD;  Location: [facility] INVASIVE CV LAB;  Service: Cardiovascular;  Laterality: N/A;   Family History Family History  Adopted: Yes    Social History Social History   Tobacco Use   Smoking status: Never   Smokeless tobacco: Never  Vaping Use   Vaping status: Never Used  Substance Use Topics   Alcohol use: Never   Drug use: Never    Allergies Prednisone and Scopace [scopolamine]  Review of Systems Review of Systems  All other systems reviewed and are negative.   Physical Exam Vital Signs  I have reviewed the triage vital signs BP (!) 165/91   Pulse (!) 144   Temp  98.1 F (36.7 C) (Oral)   Resp (!) 24   Ht 5\' 2"  (1.575 m)   Wt 74.1 kg   SpO2 93%   BMI 29.88 kg/m   Physical Exam Vitals and nursing note reviewed.  Constitutional:      General: She is not in acute distress.    Appearance: She is well-developed.  HENT:     Head: Normocephalic and atraumatic.  Eyes:     Conjunctiva/sclera: Conjunctivae normal.  Cardiovascular:     Rate and Rhythm: Tachycardia present. Rhythm irregular.     Heart sounds: No murmur heard. Pulmonary:     Effort: Pulmonary effort is normal. No respiratory distress.     Breath sounds: Normal breath sounds.  Abdominal:     Palpations: Abdomen is soft.     Tenderness: There is no abdominal tenderness.  Musculoskeletal:        General: No swelling.     Cervical back: Neck supple.  Skin:    General: Skin is warm and dry.     Capillary Refill: Capillary refill takes less than 2 seconds.  Neurological:     Mental Status: She is alert.  Psychiatric:        Mood and Affect: Mood normal.     ED Results and Treatments Labs (all labs ordered are listed, but only abnormal results are displayed) Labs Reviewed  COMPREHENSIVE METABOLIC PANEL - Abnormal; Notable for the following components:      Result Value   Glucose, Bld 183 (*)    Creatinine, Ser 1.27 (*)    Calcium 8.7 (*)    Total Protein 5.9 (*)    Albumin 3.2 (*)    GFR, Estimated 39 (*)    All other components within normal limits  BRAIN NATRIURETIC PEPTIDE - Abnormal; Notable for the following components:   B Natriuretic Peptide 244.0 (*)    All other components within normal limits  MRSA NEXT GEN BY PCR, NASAL  CBC WITH DIFFERENTIAL/PLATELET  MAGNESIUM  TSH  COMPREHENSIVE METABOLIC PANEL                                                                                                                           Radiology DG Chest Port 1 View  Result Date: 11/17/2022 CLINICAL DATA:  Dyspnea EXAM: PORTABLE CHEST 1 VIEW COMPARISON:  11/11/2022 FINDINGS: Borderline cardiopericardial silhouette. Calcified aorta. Slight linear opacity lung bases likely scar or atelectasis. No pneumothorax, effusion or consolidation. Film is rotated to the left. Overlapping cardiac leads. Osteopenia. Degenerative changes of the spine and shoulders. IMPRESSION: Mild basilar atelectasis.  Rotated radiograph. Electronically Signed   By: Karen Kays M.D.   On: 11/17/2022 16:16    Pertinent labs & imaging results that were available during my care of the patient were reviewed by me and considered in my medical decision making (see MDM for details).  Medications Ordered in ED Medications  diltiazem (CARDIZEM) 1 mg/mL load via infusion 10 mg (10 mg Intravenous  Bolus from Bag 11/17/22 1419)    And  diltiazem (CARDIZEM) 125 mg in dextrose 5% 125 mL (1 mg/mL) infusion ( Intravenous Canceled Entry 11/17/22 1811)  pravastatin (PRAVACHOL) tablet 20 mg (20 mg Oral Given 11/17/22 1620)  apixaban (ELIQUIS) tablet 5 mg (has no administration in time range)  cyanocobalamin (VITAMIN B12) tablet 1,000 mcg (has no administration in time range)  gabapentin (NEURONTIN) capsule 100 mg (has no administration in time range)  cholecalciferol (VITAMIN D3) 25 MCG (1000 UNIT) tablet 2,000 Units (has no administration in time range)  multivitamin with minerals tablet 1 tablet (has no administration in time range)  acetaminophen (TYLENOL) tablet 650 mg (has no administration in time range)  ondansetron (ZOFRAN) injection 4 mg (has no administration in time range)  diltiazem (CARDIZEM) tablet 60 mg (60 mg Oral Not Given 11/17/22 1622)  furosemide (LASIX) tablet 20 mg (20 mg Oral Given 11/17/22 1620)  letrozole [facility]) tablet 2.5 mg (has no  administration in time range)  Chlorhexidine Gluconate Cloth 2 % PADS 6 each (has no administration in time range)  potassium chloride SA (KLOR-CON M) CR tablet 40 mEq (40 mEq Oral Given 11/17/22 1620)                                                                                                                                     Procedures .Critical Care  Performed by: Glendora Score, MD Authorized by: Glendora Score, MD   Critical care provider statement:    Critical care time (minutes):  30   Critical care was necessary to treat or prevent imminent or life-threatening deterioration of the following conditions:  Cardiac failure   Critical care was time spent personally by me on the following activities:  Development of treatment plan with patient or surrogate, discussions with consultants, evaluation of patient's response to treatment, examination of patient, ordering and review of laboratory studies, ordering and review of radiographic studies, ordering and performing treatments and interventions, pulse oximetry, re-evaluation of patient's condition and review of old charts   (including critical care time)  Medical Decision Making / ED Course   This patient presents to the ED for concern of rapid heart rate, this involves an extensive number of treatment options, and is a complaint that carries with it a high risk of complications and morbidity.  The differential diagnosis includes A-fib, a flutter, electrolyte abnormality, medication noncompliance  MDM: Patient seen emergency room for evaluation of rapid heart rate.  Physical exam with a rapid irregular tachycardia but is otherwise unremarkable.  Laboratory evaluation with a creatinine of 1.27, albumin 3.2, BNP 244 but is otherwise unremarkable.  Chest x-ray unremarkable.  I did speak with the patient's family about bedside synchronized cardioversion as the patient has been compliant with her Eliquis, but they are not very comfortable  with the idea of sedating a 89 year old which is not unreasonable.  Diltiazem drip then started as this appears to  have converted her previously and patient require hospital admission.  I spoke with Dr. Wyline Mood of cardiology who will help assist inpatient team.   Additional history obtained: -Additional history obtained from multiple family members -External records from outside source obtained and reviewed including: Chart review including previous notes, labs, imaging, consultation notes   Lab Tests: -I ordered, reviewed, and interpreted labs.   The pertinent results include:   Labs Reviewed  COMPREHENSIVE METABOLIC PANEL - Abnormal; Notable for the following components:      Result Value   Glucose, Bld 183 (*)    Creatinine, Ser 1.27 (*)    Calcium 8.7 (*)    Total Protein 5.9 (*)    Albumin 3.2 (*)    GFR, Estimated 39 (*)    All other components within normal limits  BRAIN NATRIURETIC PEPTIDE - Abnormal; Notable for the following components:   B Natriuretic Peptide 244.0 (*)    All other components within normal limits  MRSA NEXT GEN BY PCR, NASAL  CBC WITH DIFFERENTIAL/PLATELET  MAGNESIUM  TSH  COMPREHENSIVE METABOLIC PANEL       Imaging Studies ordered: I independently visualized and interpreted imaging. I agree with the radiologist interpretation   Medicines ordered and prescription drug management: Meds ordered this encounter  Medications   AND Linked Order Group    diltiazem (CARDIZEM) 1 mg/mL load via infusion 10 mg    diltiazem (CARDIZEM) 125 mg in dextrose 5% 125 mL (1 mg/mL) infusion   pravastatin (PRAVACHOL) tablet 20 mg   apixaban (ELIQUIS) tablet 5 mg   cyanocobalamin (VITAMIN B12) tablet 1,000 mcg   gabapentin (NEURONTIN) capsule 100 mg   cholecalciferol (VITAMIN D3) 25 MCG (1000 UNIT) tablet 2,000 Units   multivitamin with minerals tablet 1 tablet   acetaminophen (TYLENOL) tablet 650 mg   ondansetron (ZOFRAN) injection 4 mg   diltiazem (CARDIZEM)  tablet 60 mg   potassium chloride SA (KLOR-CON M) CR tablet 40 mEq   furosemide (LASIX) tablet 20 mg    Edema     letrozole (FEMARA) tablet 2.5 mg   Chlorhexidine Gluconate Cloth 2 % PADS 6 each    -I have reviewed the patients home medicines and have made adjustments as needed  Critical interventions Diltiazem drip, cardiology consultation  Consultations Obtained: I requested consultation with the cardiologist on-call Dr. Wyline Mood,  and discussed lab and imaging findings as well as pertinent plan - they recommend: Inpatient evaluation   Cardiac Monitoring: The patient was maintained on a cardiac monitor.  I personally viewed and interpreted the cardiac monitored which showed an underlying rhythm of: A-fib with RVR  Social Determinants of Health:  Factors impacting patients care include: Lives in facility   Reevaluation: After the interventions noted above, I reevaluated the patient and found that they have :stayed the same  Co morbidities that complicate the patient evaluation  Past Medical History:  Diagnosis Date   A-fib [facility])    Arthritis    hands   Atherosclerotic PVD with ulceration (HCC) 07/15/2017   RIGHT HEEL   Breast cancer (HCC)    Cancer (HCC)    Complication of anesthesia    lost eyesight for a few days after surgery in the 1950's   Constipation    Diabetes mellitus without complication (HCC)    no medications    Hypertension    Macular degeneration, bilateral    "one eye wet; one eye dry; she gets shots" (07/07/2017)   PAD (peripheral artery disease) (HCC)    Pneumonia  Stroke [facility])    some trouble swallowing      Dispostion: I considered admission for this patient, due to A-fib with RVR requiring diltiazem drip patient require hospital admission     Final Clinical Impression(s) / ED Diagnoses Final diagnoses:  Atrial flutter with rapid ventricular response [facility])     @PCDICTATION @    Glendora Score, MD 11/17/22 [phone]

## 2022-11-18 ENCOUNTER — Observation Stay (HOSPITAL_COMMUNITY): Payer: Medicare Other

## 2022-11-18 DIAGNOSIS — I639 Cerebral infarction, unspecified: Secondary | ICD-10-CM | POA: Diagnosis not present

## 2022-11-18 DIAGNOSIS — E1122 Type 2 diabetes mellitus with diabetic chronic kidney disease: Secondary | ICD-10-CM | POA: Diagnosis not present

## 2022-11-18 DIAGNOSIS — H3530 Unspecified macular degeneration: Secondary | ICD-10-CM | POA: Diagnosis present

## 2022-11-18 DIAGNOSIS — E785 Hyperlipidemia, unspecified: Secondary | ICD-10-CM | POA: Diagnosis present

## 2022-11-18 DIAGNOSIS — I6381 Other cerebral infarction due to occlusion or stenosis of small artery: Secondary | ICD-10-CM | POA: Diagnosis not present

## 2022-11-18 DIAGNOSIS — Z66 Do not resuscitate: Secondary | ICD-10-CM | POA: Diagnosis not present

## 2022-11-18 DIAGNOSIS — Z7189 Other specified counseling: Secondary | ICD-10-CM

## 2022-11-18 DIAGNOSIS — Z515 Encounter for palliative care: Secondary | ICD-10-CM | POA: Diagnosis not present

## 2022-11-18 DIAGNOSIS — G8191 Hemiplegia, unspecified affecting right dominant side: Secondary | ICD-10-CM | POA: Diagnosis not present

## 2022-11-18 DIAGNOSIS — Z89512 Acquired absence of left leg below knee: Secondary | ICD-10-CM | POA: Diagnosis not present

## 2022-11-18 DIAGNOSIS — N1832 Chronic kidney disease, stage 3b: Secondary | ICD-10-CM | POA: Diagnosis not present

## 2022-11-18 DIAGNOSIS — I4891 Unspecified atrial fibrillation: Secondary | ICD-10-CM | POA: Diagnosis not present

## 2022-11-18 DIAGNOSIS — Z79811 Long term (current) use of aromatase inhibitors: Secondary | ICD-10-CM | POA: Diagnosis not present

## 2022-11-18 DIAGNOSIS — I4819 Other persistent atrial fibrillation: Secondary | ICD-10-CM | POA: Diagnosis not present

## 2022-11-18 DIAGNOSIS — N183 Chronic kidney disease, stage 3 unspecified: Secondary | ICD-10-CM | POA: Diagnosis not present

## 2022-11-18 DIAGNOSIS — E1151 Type 2 diabetes mellitus with diabetic peripheral angiopathy without gangrene: Secondary | ICD-10-CM | POA: Diagnosis present

## 2022-11-18 DIAGNOSIS — Z7901 Long term (current) use of anticoagulants: Secondary | ICD-10-CM | POA: Diagnosis not present

## 2022-11-18 DIAGNOSIS — R69 Illness, unspecified: Secondary | ICD-10-CM | POA: Diagnosis not present

## 2022-11-18 DIAGNOSIS — Z888 Allergy status to other drugs, medicaments and biological substances status: Secondary | ICD-10-CM | POA: Diagnosis not present

## 2022-11-18 DIAGNOSIS — R5381 Other malaise: Secondary | ICD-10-CM | POA: Diagnosis not present

## 2022-11-18 DIAGNOSIS — G459 Transient cerebral ischemic attack, unspecified: Secondary | ICD-10-CM | POA: Diagnosis not present

## 2022-11-18 DIAGNOSIS — Z79899 Other long term (current) drug therapy: Secondary | ICD-10-CM | POA: Diagnosis not present

## 2022-11-18 DIAGNOSIS — I6782 Cerebral ischemia: Secondary | ICD-10-CM | POA: Diagnosis not present

## 2022-11-18 DIAGNOSIS — M6281 Muscle weakness (generalized): Secondary | ICD-10-CM | POA: Diagnosis not present

## 2022-11-18 DIAGNOSIS — I6523 Occlusion and stenosis of bilateral carotid arteries: Secondary | ICD-10-CM | POA: Diagnosis not present

## 2022-11-18 DIAGNOSIS — G546 Phantom limb syndrome with pain: Secondary | ICD-10-CM | POA: Diagnosis not present

## 2022-11-18 DIAGNOSIS — Z961 Presence of intraocular lens: Secondary | ICD-10-CM | POA: Diagnosis present

## 2022-11-18 DIAGNOSIS — R29818 Other symptoms and signs involving the nervous system: Secondary | ICD-10-CM | POA: Diagnosis not present

## 2022-11-18 DIAGNOSIS — I4892 Unspecified atrial flutter: Secondary | ICD-10-CM | POA: Diagnosis not present

## 2022-11-18 DIAGNOSIS — R278 Other lack of coordination: Secondary | ICD-10-CM | POA: Diagnosis not present

## 2022-11-18 DIAGNOSIS — I130 Hypertensive heart and chronic kidney disease with heart failure and stage 1 through stage 4 chronic kidney disease, or unspecified chronic kidney disease: Secondary | ICD-10-CM | POA: Diagnosis not present

## 2022-11-18 DIAGNOSIS — Z7401 Bed confinement status: Secondary | ICD-10-CM | POA: Diagnosis not present

## 2022-11-18 DIAGNOSIS — I10 Essential (primary) hypertension: Secondary | ICD-10-CM | POA: Diagnosis not present

## 2022-11-18 DIAGNOSIS — Z90710 Acquired absence of both cervix and uterus: Secondary | ICD-10-CM | POA: Diagnosis not present

## 2022-11-18 DIAGNOSIS — E1140 Type 2 diabetes mellitus with diabetic neuropathy, unspecified: Secondary | ICD-10-CM | POA: Diagnosis not present

## 2022-11-18 DIAGNOSIS — R9089 Other abnormal findings on diagnostic imaging of central nervous system: Secondary | ICD-10-CM | POA: Diagnosis not present

## 2022-11-18 DIAGNOSIS — C50911 Malignant neoplasm of unspecified site of right female breast: Secondary | ICD-10-CM | POA: Diagnosis not present

## 2022-11-18 DIAGNOSIS — I5032 Chronic diastolic (congestive) heart failure: Secondary | ICD-10-CM | POA: Diagnosis not present

## 2022-11-18 DIAGNOSIS — G2581 Restless legs syndrome: Secondary | ICD-10-CM | POA: Diagnosis not present

## 2022-11-18 DIAGNOSIS — I5033 Acute on chronic diastolic (congestive) heart failure: Secondary | ICD-10-CM

## 2022-11-18 DIAGNOSIS — I672 Cerebral atherosclerosis: Secondary | ICD-10-CM | POA: Diagnosis not present

## 2022-11-18 DIAGNOSIS — E782 Mixed hyperlipidemia: Secondary | ICD-10-CM | POA: Diagnosis not present

## 2022-11-18 DIAGNOSIS — Z8673 Personal history of transient ischemic attack (TIA), and cerebral infarction without residual deficits: Secondary | ICD-10-CM | POA: Diagnosis not present

## 2022-11-18 DIAGNOSIS — R2689 Other abnormalities of gait and mobility: Secondary | ICD-10-CM | POA: Diagnosis not present

## 2022-11-18 MED ORDER — GUAIFENESIN-DM 100-10 MG/5ML PO SYRP
5.0000 mL | ORAL_SOLUTION | ORAL | Status: DC | PRN
  Administered 2022-11-18: 5 mL via ORAL
  Filled 2022-11-18: qty 5

## 2022-11-18 MED ORDER — DILTIAZEM HCL 60 MG PO TABS
60.0000 mg | ORAL_TABLET | Freq: Four times a day (QID) | ORAL | Status: DC
  Administered 2022-11-18 – 2022-11-19 (×7): 60 mg via ORAL
  Filled 2022-11-18 (×7): qty 1

## 2022-11-18 MED ORDER — LORAZEPAM 2 MG/ML IJ SOLN
1.0000 mg | Freq: Once | INTRAMUSCULAR | Status: AC
  Administered 2022-11-18: 1 mg via INTRAVENOUS
  Filled 2022-11-18: qty 1

## 2022-11-18 MED ORDER — ALBUTEROL SULFATE (2.5 MG/3ML) 0.083% IN NEBU
INHALATION_SOLUTION | RESPIRATORY_TRACT | Status: AC
  Filled 2022-11-18: qty 3

## 2022-11-18 MED ORDER — HYDRALAZINE HCL 20 MG/ML IJ SOLN
5.0000 mg | Freq: Four times a day (QID) | INTRAMUSCULAR | Status: DC | PRN
  Administered 2022-11-18 – 2022-11-20 (×3): 5 mg via INTRAVENOUS
  Filled 2022-11-18 (×3): qty 1

## 2022-11-18 MED ORDER — ALBUTEROL SULFATE (2.5 MG/3ML) 0.083% IN NEBU: 2.5000 mg | INHALATION_SOLUTION | Freq: Once | RESPIRATORY_TRACT | Status: DC

## 2022-11-18 MED ORDER — DOCUSATE SODIUM 100 MG PO CAPS
100.0000 mg | ORAL_CAPSULE | Freq: Two times a day (BID) | ORAL | Status: DC
  Administered 2022-11-18 – 2022-11-19 (×3): 100 mg via ORAL
  Filled 2022-11-18 (×3): qty 1

## 2022-11-18 MED ORDER — POLYETHYLENE GLYCOL 3350 17 G PO PACK
17.0000 g | PACK | Freq: Every day | ORAL | Status: DC
  Administered 2022-11-18 – 2022-11-20 (×3): 17 g via ORAL
  Filled 2022-11-18 (×3): qty 1

## 2022-11-18 NOTE — Plan of Care (Signed)
  Problem: Clinical Measurements: Goal: Respiratory complications will improve Outcome: Progressing Goal: Cardiovascular complication will be avoided Outcome: Progressing   Problem: Clinical Measurements: Goal: Cardiovascular complication will be avoided Outcome: Progressing   Problem: Activity: Goal: Risk for activity intolerance will decrease Outcome: Progressing   Problem: Nutrition: Goal: Adequate nutrition will be maintained Outcome: Progressing

## 2022-11-18 NOTE — Discharge Instructions (Signed)
  Providers Accepting New Patients in [place], [place]    [facility] [address]  [place], [place] [address] [phone] Accepts most insurances  [facility] [address] [place], [place] [address] [phone] Accepts most insurances  [facility] [place] [address] [place], [place] [address]  [phone] Must meet requirements  [facility] [address]  [place], [place] [address] [phone] Accepts most insurances  [facility] [address]  [place], [place] [address] [phone] Accepts most insurances  [facility] [address]   [place], [place]   [phone] Accepts most insurances  [facility] ([facility])  [address]  [place], [place] [address] [phone] Accepts most insurances     [facility] [address]  [place], [place] [address] [phone] Accepts most insurances  [facility] [facility] [address] [place], [place] [address] [phone] option 1 Accepts Medicaid and Uninsured  [facility] [address]  [place], [place] [address] [phone] Accepts most insurances  Tesfaye Fanta, MD [address]  [place], [place] [address] [phone] Accepts most insurances  [facility] [place] [address]  [place], [place] [address] [phone] Accepts most insurances  [facility] [address] [place],  [address] [phone] Accepts most insurances  Zack Hall, MD [address] [place],  [address] [phone]  Accepts most insurances                      

## 2022-11-18 NOTE — Progress Notes (Signed)
Calling out, "Help me." Upon entering room, she is stating, "I am going to die." She also states, "I can't move my right arm. " VS WNL.  She can not hold her right arm up in the air but is able to squeeze my hand w/ a moderate grip and move her hand and fingers. All other extremities strong. Pupils equal , 3mm, and brisk bilaterally. Answers all orientation questions correctly except the year she says is 2023. Also placed Oxygen on her b/c she is feeling short of breath. Noted Wheezing in upper airway and RT at bedside to complete a breathing tx.   Notified Dr Delrae Rend of all events.

## 2022-11-18 NOTE — Consult Note (Signed)
Consultation Note Date: 11/18/2022   Patient Name: [name] Petersen  DOB: [birth year]  MRN: [id number]  Age / Sex: 89 y.o., female  PCP: Pcp, No Referring Physician: Vassie Loll, MD  Reason for Consultation: Establishing goals of care  HPI/Patient Profile: 89 y.o. female  with past medical history of stroke, afib, PAD, diabetes, L BKA admitted on 11/17/2022 with shortness of breath with recurrent AF RVR.   Clinical Assessment and Goals of Care: Consult received and chart review completed. Advance Directive reviewed. I met today with Ms. [name] Petersen along with her daughter, [name], at bedside. Ms. [name] Petersen is awake and alert. She has 3 daughters, 3 grandchildren, and 3 great grandchildren. Her daughter, [name] Petersen, works at [employer] where [name] Petersen has been living for ~5 years now. She complains of chronic pain in L BKA and restlessness at night. [name] explains that they are working to exchange a hospital bed for her previous bed as this is expected to help her sleep at night. She takes gabapentin for BKA chronic pain and this is tolerable.   I spoke more with [name] as [name] Petersen had EKG. [name] shares that they did have an initial visit from [facility] palliative nurse although they were surprised as they were not expecting palliative care - they agree with [facility] palliative to continue to follow outpatient. [name] also shares hopes that afib can be better controlled with medication adjustments. We did discuss that everyone responds differently to medications and some patients have more difficulty being able to achieve control despite medication adjustments. [name] shares that her mother has had poor quality of life and they are not interested in invasive/aggressive measures. They declined surgical intervention for breast cancer and [name] shares that they regret doing the infusions but this is only in hindsight. She knows that her mother does  not want aggressive measures to prolong.   I provided them with MOST form and Hard Choices booklet. We reviewed in brief and they plan to speak as a family about the choices and [name] Petersen wishes. I reviewed with them hopes that palliative care can assist them with helping to identify her wishes and care goals as time goes on. We reviewed that what makes sense today may not next week or next month. They know there will be a time she may not want to return to the hospital and a time when hospice care may be beneficial. At this time they are hopeful for better control of afib with po medications and return to [place].   All questions/concerns addressed. Emotional support provided.   Primary Decision Maker PATIENT HCPOA: Daughters Vic Blackbird    SUMMARY OF RECOMMENDATIONS   - DNR in place - MOST form and Hard Choices provided - [facility] outpatient palliative to follow  Code Status/Advance Care Planning: DNR   Symptom Management:  Per attending, cardiology  Prognosis:  Overall prognosis poor given underlying co-morbidities and advanced age.   Discharge Planning: Return to [facility] with [facility] outpatient palliative care      Primary Diagnoses: Present on  Admission:  Atrial fibrillation with RVR (HCC)  Essential hypertension  Stage 3 chronic kidney disease (HCC)  Hyperlipidemia   I have reviewed the medical record, interviewed the patient and family, and examined the patient. The following aspects are pertinent.  Past Medical History:  Diagnosis Date   A-fib [facility])    Arthritis    hands   Atherosclerotic PVD with ulceration (HCC) 07/15/2017   RIGHT HEEL   Breast cancer (HCC)    Cancer (HCC)    Complication of anesthesia    lost eyesight for a few days after surgery in the 1950's   Constipation    Diabetes mellitus without complication (HCC)    no medications    Hypertension    Macular degeneration, bilateral    "one eye wet; one eye dry; she gets shots" (07/07/2017)    PAD (peripheral artery disease) (HCC)    Pneumonia    Stroke (HCC)    some trouble swallowing   Social History   Socioeconomic History   Marital status: Widowed    Spouse name: Not on file   Number of children: Not on file   Years of education: Not on file   Highest education level: Not on file  Occupational History   Not on file  Tobacco Use   Smoking status: Never   Smokeless tobacco: Never  Vaping Use   Vaping status: Never Used  Substance and Sexual Activity   Alcohol use: Never   Drug use: Never   Sexual activity: Not Currently    Comment: intercourse age 49,sexual patrners less than 5   Other Topics Concern   Not on file  Social History Narrative   Not on file   Social Determinants of Health   Financial Resource Strain: Low Risk  (06/29/2022)   Received from [facility], [facility]   Overall Financial Resource Strain (CARDIA)    Difficulty of Paying Living Expenses: Not hard at all  Food Insecurity: No Food Insecurity (11/17/2022)   Hunger Vital Sign    Worried About Running Out of Food in the Last Year: Never true    Ran Out of Food in the Last Year: Never true  Transportation Needs: No Transportation Needs (11/17/2022)   PRAPARE - Administrator, Civil Service (Medical): No    Lack of Transportation (Non-Medical): No  Physical Activity: Inactive (06/29/2022)   Received from [facility], [facility]   Exercise Vital Sign    Days of Exercise per Week: 0 days    Minutes of Exercise per Session: 0 min  Stress: No Stress Concern Present (06/29/2022)   Received from [facility], [facility] - Occupational Stress Questionnaire    Feeling of Stress : Not at all  Social Connections: Moderately Integrated (06/29/2022)   Received from [facility], [facility]   Social Connection and Isolation Panel [NHANES]    Frequency of Communication with Friends and Family: More than three times a  week    Frequency of Social Gatherings with Friends and Family: Twice a week    Attends Religious Services: More than 4 times per year    Active Member of [employer] or Organizations: Yes    Attends Banker Meetings: More than 4 times per year    Marital Status: Widowed   Family History  Adopted: Yes   Scheduled Meds:  apixaban  5 mg Oral BID   Chlorhexidine Gluconate Cloth  6 each Topical Q0600   cholecalciferol  2,000 Units Oral Daily   cyanocobalamin  1,000 mcg Oral Daily   diltiazem  60 mg Oral Q8H   docusate sodium  100 mg Oral BID   furosemide  20 mg Oral Daily   gabapentin  100 mg Oral Daily   letrozole  2.5 mg Oral Daily   multivitamin with minerals  1 tablet Oral Daily   polyethylene glycol  17 g Oral Daily   pravastatin  20 mg Oral q1800   Continuous Infusions:  diltiazem (CARDIZEM) infusion 10 mg/hr (11/18/22 0936)   PRN Meds:.acetaminophen, guaiFENesin-dextromethorphan, ondansetron (ZOFRAN) IV Allergies  Allergen Reactions   Prednisone Other (See Comments)    Other Reaction(s): Fatique   Scopace [Scopolamine] Other (See Comments)    Hallucinations   Review of Systems  Constitutional:  Positive for activity change, appetite change and fatigue.  Neurological:  Positive for weakness.    Physical Exam Vitals and nursing note reviewed.  Constitutional:      General: She is not in acute distress.    Comments: Generalized weakness and fatigue  Cardiovascular:     Rate and Rhythm: Normal rate.  Pulmonary:     Effort: No tachypnea, accessory muscle usage or respiratory distress.  Abdominal:     Palpations: Abdomen is soft.  Neurological:     Mental Status: She is alert and oriented to person, place, and time.     Vital Signs: BP (!) 152/83   Pulse 84   Temp 98.1 F (36.7 C) (Oral)   Resp (!) 21   Ht 5\' 2"  (1.575 m)   Wt 74.1 kg   SpO2 99%   BMI 29.88 kg/m  Pain Scale: 0-10   Pain Score: 0-No pain   SpO2: SpO2: 99 % O2 Device:SpO2: 99  % O2 Flow Rate: .O2 Flow Rate (L/min): 2 L/min  IO: Intake/output summary:  Intake/Output Summary (Last 24 hours) at 11/18/2022 1055 Last data filed at 11/18/2022 0500 Gross per 24 hour  Intake 642.61 ml  Output 500 ml  Net 142.61 ml    LBM: Last BM Date : 11/10/22 Baseline Weight: Weight: 74.1 kg Most recent weight: Weight: 74.1 kg     Palliative Assessment/Data:     Time Total: 75 min  Greater than 50%  of this time was spent counseling and coordinating care related to the above assessment and plan.  Signed by: Yong Channel, NP Palliative Medicine Team Pager # [phone] (M-F 8a-5p) Team Phone # [phone] (Nights/Weekends)

## 2022-11-18 NOTE — Consult Note (Addendum)
Cardiology Consultation   Patient ID: [name] Petersen MRN: [id number]; DOB: [birth year]  Admit date: 11/17/2022 Date of Consult: 11/18/2022  PCP:  Oneita Hurt, No   [facility] Providers Cardiologist: New to [facility]  Patient Profile:   [name] Petersen is a 89 y.o. female with a hx of paroxysmal atrial fibrillation, HTN, HLD, Type II DM, history of CVA, macular degeneration, right breast cancer (followed at [facility] and receiving Letrozole) and prior left BKA who is being seen 11/18/2022 for the evaluation of atrial fibrillation at the request of Dr. Randol Kern.  History of Present Illness:   [name] Petersen was diagnosed with atrial fibrillation during admission in 08/2022 and converted back to normal sinus rhythm with IV Cardizem at that time. Risks and benefits of anticoagulation were reviewed and she was started on Eliquis for anticoagulation along with Lopressor 12.5 mg twice daily for rate control.  She had a recurrent admission from 7/23 - 11/10/2022 for evaluation of worsening dyspnea and fatigue. She was found to be back in atrial fibrillation with RVR and was started on IV Cardizem again and discharged on Cardizem CD 180 mg daily. She did report fatigue with Lopressor and had discontinued this prior to admission. She was also found to have a mild CHF exacerbation as BNP was elevated to 361 and she received IV Lasix and was discharged on Lasix 20 mg daily.  She presented back to [facility] ED on 11/17/2022 from [facility] for evaluation of worsening dyspnea and tachycardia with heart rate in the 110's to 160's. Labs showed WBC 6.2, Hgb 12.2, platelets 270, Na+ 136, K+ 3.8 and creatinine 1.27 (close to baseline).  Albumin low at 3.2. LFT's within normal limits. Magnesium 2.0.  TSH 1.171.  BNP elevated to 244.  CXR showed mild basilar atelectasis. EKG unavailable for review in epic.  She received IV Digoxin 0.25mg  and IV Cardizem 10mg . Was also started on IV Cardizem along with short-acting  Cardizem 60 mg every 6 hours but she has not received doses of this due to being on the drip. She has been continued on PTA PO Lasix 20 mg daily.  In talking with the patient and her daughter today, she reports having episodes of dyspnea on exertion and orthopnea. Activity is limited given her amputation and she is mostly in a wheelchair throughout the day but does stand for transfers. Denies any specific chest pain or palpitations. Does experience occasional lower extremity edema.  Says that she was asymptomatic yesterday when found to be tachycardic and this was found on a routine vitals check while at [facility]. She has been on Eliquis for anticoagulation with no reports of melena, hematochezia or hematuria.  Past Medical History:  Diagnosis Date   A-fib [facility])    Arthritis    hands   Atherosclerotic PVD with ulceration (HCC) 07/15/2017   RIGHT HEEL   Breast cancer (HCC)    Cancer (HCC)    Complication of anesthesia    lost eyesight for a few days after surgery in the 1950's   Constipation    Diabetes mellitus without complication (HCC)    no medications    Hypertension    Macular degeneration, bilateral    "one eye wet; one eye dry; she gets shots" (07/07/2017)   PAD (peripheral artery disease) (HCC)    Pneumonia    Stroke [facility])    some trouble swallowing    Past Surgical History:  Procedure Laterality Date   ABDOMINAL AORTOGRAM W/LOWER EXTREMITY N/A 06/16/2017  Procedure: ABDOMINAL AORTOGRAM W/LOWER EXTREMITY;  Surgeon: Fransisco Hertz, MD;  Location: [facility] INVASIVE CV LAB;  Service: Cardiovascular;  Laterality: N/A;  lt extermity   ABDOMINAL HYSTERECTOMY     AMPUTATION Left 07/06/2017   Procedure: AMPUTATION BELOW KNEE LEFT;  Surgeon: Larina Earthly, MD;  Location: [facility] OR;  Service: Vascular;  Laterality: Left;   CATARACT EXTRACTION W/ INTRAOCULAR LENS  IMPLANT, BILATERAL     DILATION AND CURETTAGE OF UTERUS     LOWER EXTREMITY ANGIOGRAPHY N/A 07/14/2017   Procedure: LOWER  EXTREMITY ANGIOGRAPHY;  Surgeon: Chuck Hint, MD;  Location: [facility] INVASIVE CV LAB;  Service: Cardiovascular;  Laterality: N/A;     Home Medications:  Prior to Admission medications   Medication Sig Start Date End Date Taking? Authorizing Provider  acetaminophen (TYLENOL) 325 MG tablet Take 3 tablets (975 mg total) by mouth at bedtime as needed for mild pain. 09/12/22   Johnson, Clanford L, MD  apixaban (ELIQUIS) 5 MG TABS tablet Take 1 tablet (5 mg total) by mouth 2 (two) times daily. 09/12/22   Johnson, Clanford L, MD  beta carotene w/minerals (OCUVITE) tablet Take 1 tablet by mouth daily.    [provider]  calcium carbonate (TUMS - DOSED IN MG ELEMENTAL CALCIUM) 500 MG chewable tablet Chew 2 tablets by mouth as needed for indigestion or heartburn.    [provider]  Cholecalciferol (VITAMIN D3) 50 MCG (2000 UT) capsule Take 2,000 Units by mouth daily.    [provider]  cyanocobalamin 1000 MCG tablet Take 1,000 mcg by mouth daily.    [provider]  diltiazem (CARDIZEM CD) 180 MG 24 hr capsule Take 1 capsule (180 mg total) by mouth daily. 11/10/22 11/10/23  Shon Hale, MD  furosemide (LASIX) 20 MG tablet Take 20 mg by mouth daily. Edema    [provider]  gabapentin (NEURONTIN) 100 MG capsule Take 100 mg by mouth daily.    [provider]  hydrocortisone (ANUSOL-HC) 2.5 % rectal cream Place 1 Application rectally 2 (two) times daily.    [provider]  letrozole (FEMARA) 2.5 MG tablet Take 2.5 mg by mouth daily.    [provider]  lovastatin (MEVACOR) 20 MG tablet Take 20 mg by mouth daily at 6 PM. 11/30/18   [provider]  Menthol, Topical Analgesic, (BIOFREEZE) 4 % GEL Apply topically as needed.    [provider]  Multiple Vitamins-Minerals (MULTIVITAMIN WITH MINERALS) tablet Take 1 tablet by mouth daily.    [provider]  nystatin ointment (MYCOSTATIN) Apply 1 Application  topically 2 (two) times daily. 09/07/22   Aida Raider, NP  phenylephrine-shark liver oil-mineral oil-petrolatum (PREPARATION H) 0.25-3-14-71.9 % rectal ointment Place 1 Application rectally 2 (two) times daily. Apply internally for 10 days and then as needed. 09/07/22   Aida Raider, NP  trastuzumab 4 mg/kg in sodium chloride 0.9 % 250 mL Inject 4 mg/kg into the vein once.    [provider]    Inpatient Medications: Scheduled Meds:  apixaban  5 mg Oral BID   Chlorhexidine Gluconate Cloth  6 each Topical Q0600   cholecalciferol  2,000 Units Oral Daily   cyanocobalamin  1,000 mcg Oral Daily   diltiazem  60 mg Oral Q8H   docusate sodium  100 mg Oral BID   furosemide  20 mg Oral Daily   gabapentin  100 mg Oral Daily   letrozole  2.5 mg Oral Daily   multivitamin with minerals  1 tablet Oral Daily   polyethylene glycol  17 g Oral Daily   pravastatin  20 mg Oral q1800   Continuous Infusions:  diltiazem (CARDIZEM) infusion 5 mg/hr (11/18/22 1112)   PRN Meds: acetaminophen, guaiFENesin-dextromethorphan, ondansetron (ZOFRAN) IV  Allergies:    Allergies  Allergen Reactions   Prednisone Other (See Comments)    Other Reaction(s): Fatique   Scopace [Scopolamine] Other (See Comments)    Hallucinations    Social History:   Social History   Socioeconomic History   Marital status: Widowed    Spouse name: Not on file   Number of children: Not on file   Years of education: Not on file   Highest education level: Not on file  Occupational History   Not on file  Tobacco Use   Smoking status: Never   Smokeless tobacco: Never  Vaping Use   Vaping status: Never Used  Substance and Sexual Activity   Alcohol use: Never   Drug use: Never   Sexual activity: Not Currently    Comment: intercourse age 38,sexual patrners less than 5   Other Topics Concern   Not on file  Social History Narrative   Not on file   Social Determinants of Health   Financial Resource Strain:  Low Risk  (06/29/2022)   Received from [facility], [facility]   Overall Financial Resource Strain (CARDIA)    Difficulty of Paying Living Expenses: Not hard at all  Food Insecurity: No Food Insecurity (11/17/2022)   Hunger Vital Sign    Worried About Running Out of Food in the Last Year: Never true    Ran Out of Food in the Last Year: Never true  Transportation Needs: No Transportation Needs (11/17/2022)   PRAPARE - Administrator, Civil Service (Medical): No    Lack of Transportation (Non-Medical): No  Physical Activity: Inactive (06/29/2022)   Received from [facility], [facility]   Exercise Vital Sign    Days of Exercise per Week: 0 days    Minutes of Exercise per Session: 0 min  Stress: No Stress Concern Present (06/29/2022)   Received from [facility], [facility] Occupational [facility] - Occupational Stress Questionnaire    Feeling of Stress : Not at all  Social Connections: Moderately Integrated (06/29/2022)   Received from [facility], [facility]   Social Connection and Isolation Panel [NHANES]    Frequency of Communication with Friends and Family: More than three times a week    Frequency of Social Gatherings with Friends and Family: Twice a week    Attends Religious Services: More than 4 times per year    Active Member of [employer] or Organizations: Yes    Attends Banker Meetings: More than 4 times per year    Marital Status: Widowed  Intimate Partner Violence: Not At Risk (11/17/2022)   Humiliation, Afraid, Rape, and Kick questionnaire    Fear of Current or Ex-Partner: No    Emotionally Abused: No    Physically Abused: No    Sexually Abused: No    Family History:    Family History  Adopted: Yes     ROS:  Please see the history of present illness.   All other ROS reviewed and negative.     Physical Exam/Data:   Vitals:   11/18/22 0500 11/18/22 0700 11/18/22 0731 11/18/22 0800  BP: (!)  152/73 (!) 161/96 121/73 (!) 152/83  Pulse:  70 71 73 84  Resp: 18 20 (!) 22 (!) 21  Temp:  98.1 F (36.7 C)    TempSrc:  Oral    SpO2: 100% 98% 99% 99%  Weight:      Height:        Intake/Output Summary (Last 24 hours) at 11/18/2022 1121 Last data filed at 11/18/2022 0500 Gross per 24 hour  Intake 642.61 ml  Output 500 ml  Net 142.61 ml      11/17/2022    4:21 PM 11/11/2022    5:31 AM 11/10/2022    5:00 AM  Last 3 Weights  Weight (lbs) 163 lb 5.8 oz 158 lb 11.7 oz 159 lb 9.8 oz  Weight (kg) 74.1 kg 72 kg 72.4 kg     Body mass index is 29.88 kg/m.  General:  Pleasant elderly female appearing no acute distress.  HEENT: normal Neck: no JVD Vascular: No carotid bruits; Distal pulses 2+ bilaterally Cardiac:  normal S1, S2; Irregularly irregular Lungs:  clear to auscultation bilaterally, no wheezing, rhonchi or rales  Abd: soft, nontender, no hepatomegaly  Ext: no pitting edema Musculoskeletal:  Left BKA Skin: warm and dry  Neuro:  CNs 2-12 intact, no focal abnormalities noted Psych:  Normal affect   Telemetry:  Telemetry was personally reviewed and demonstrates: Atrial fibrillation, HR in 80's to 90's overnight and this morning.   Relevant CV Studies:  Echocardiogram: 08/2022 IMPRESSIONS     1. Left ventricular ejection fraction, by estimation, is 60 to 65%. The  left ventricle has normal function. The left ventricle has no regional  wall motion abnormalities. There is mild left ventricular hypertrophy.  Left ventricular diastolic parameters  are consistent with Grade II diastolic dysfunction (pseudonormalization).  Elevated left atrial pressure.   2. Right ventricular systolic function is normal. The right ventricular  size is normal.   3. The mitral valve is normal in structure. Mild mitral valve  regurgitation. No evidence of mitral stenosis. Moderate mitral annular  calcification.   4. The aortic valve is tricuspid. Aortic valve regurgitation is not  visualized.  Mild aortic valve stenosis.   5. The inferior vena cava is normal in size with greater than 50%  respiratory variability, suggesting right atrial pressure of 3 mmHg.    Laboratory Data:  High Sensitivity Troponin:   Recent Labs  Lab 11/11/22 0630  TROPONINIHS 10     Chemistry Recent Labs  Lab 11/17/22 1359 11/18/22 0505  NA 136 137  K 3.8 4.0  CL 101 102  CO2 26 26  GLUCOSE 183* 148*  BUN 15 14  CREATININE 1.27* 1.16*  CALCIUM 8.7* 8.8*  MG 2.0  --   GFRNONAA 39* 44*  ANIONGAP 9 9    Recent Labs  Lab 11/17/22 1359 11/18/22 0505  PROT 5.9* 5.8*  ALBUMIN 3.2* 3.2*  AST 23 20  ALT 18 17  ALKPHOS 75 74  BILITOT 0.7 0.5   Lipids No results for input(s): "CHOL", "TRIG", "HDL", "LABVLDL", "LDLCALC", "CHOLHDL" in the last 168 hours.  Hematology Recent Labs  Lab 11/17/22 1359  WBC 6.2  RBC 4.25  HGB 12.2  HCT 39.0  MCV 91.8  MCH 28.7  MCHC 31.3  RDW 14.6  PLT 270   Thyroid  Recent Labs  Lab 11/17/22 1359  TSH 1.171    BNP Recent Labs  Lab 11/17/22 1359  BNP 244.0*    DDimer No results for input(s): "DDIMER" in the last 168 hours.   Radiology/Studies:  DG Chest  Port 1 View  Result Date: 11/17/2022 CLINICAL DATA:  Dyspnea EXAM: PORTABLE CHEST 1 VIEW COMPARISON:  11/11/2022 FINDINGS: Borderline cardiopericardial silhouette. Calcified aorta. Slight linear opacity lung bases likely scar or atelectasis. No pneumothorax, effusion or consolidation. Film is rotated to the left. Overlapping cardiac leads. Osteopenia. Degenerative changes of the spine and shoulders. IMPRESSION: Mild basilar atelectasis.  Rotated radiograph. Electronically Signed   By: Karen Kays M.D.   On: 11/17/2022 16:16     Assessment and Plan:   1. Atrial Fibrillation with RVR - This is her third admission for atrial fibrillation with RVR and while she denies any chest pain or palpitations, she does report worsening dyspnea on exertion and orthopnea when tachycardic. - She is  currently on IV Cardizem at 10 mg/hr and given her well-controlled rates in the 80's to 90's, did review her with her nurse in regards to decreasing the dose and starting the PO doses that are ordered. Will review with Dr. Wyline Mood but anticipate she would likely benefit from antiarrhythmic therapy given her recurrent admissions.  Amiodarone would likely be ideal given her age (no interactions with Letrozole by review of [facility] as she is on this for her breast cancer). She was previously intolerant to beta-blocker therapy due to significant fatigue. - No reports of active bleeding. She remains on Eliquis 5 mg twice daily for anticoagulation which is the appropriate dose at this time given her age, weight and renal function.  2. Acute on chronic HFpEF - She reports still having orthopnea and BNP was elevated to 244 on admission which has improved over the past few weeks. CXR showed atelectasis but no fluid overload. She already received PO Lasix 20 mg this morning and reports good urine output. If orthopnea does not improve, can switch to IV Lasix.   3. HTN - BP has been elevated with SBP in the 150's to 170's this morning. She currently remains on IV Cardizem.  4. HLD - On Lovastatin prior to admission but currently switched to Pravastatin given formulary availability.   5. Right Arm Weakness - She reports an episode of isolated right arm weakness this morning with no other neurological deficits at that time. The admitting team was made aware and a Head CT is pending.   Risk Assessment/Risk Scores:   CHA2DS2-VASc Score = 8   This indicates a 10.8% annual risk of stroke. The patient's score is based upon: CHF History: 0 HTN History: 1 Diabetes History: 1 Stroke History: 2 Vascular Disease History: 1 Age Score: 2 Gender Score: 1   For questions or updates, please contact [facility] Please consult [email or web address] for contact info under    Signed, Ellsworth Lennox, PA-C   11/18/2022 11:21 AM  Attending note 89 yo female history of PAF, stroke, PAD with left BKA, DM2, breast CA, admitted with tachycardia and dyspnea. She reports medication compliance. Found to be in afib with RVR in the ER. Started on dilt gtt.    WBC 6.2 Hgb 12.2 Plt 270 K 3.8 BUN 15 Cr 1.27 Mg 2 TSH 1.171 BNP 244 CXR no acute process EKG afib RVR  08/2022 echo: LVEF 60-65%, no WMAs, grade II dd, normal RV function, mild MR, mild AS   1.PAF/Afib with RVR - had been on dilt 180 since 11/10/22 discharge, was admitted with afib with RVR. Also prior admit 08/2022 with afib with RVR, started on metoprolol at that time. Later changed to diltiazem due to fatigue.  - she is on eliquis 5mg   bid for stroke prevention  - on dilt gtt, will increase oral dilt to 60mg  qid and see if can wean drip. Consolidated to long acting dilt closer to discharge. If fails rate control would start amiodarone.    2.Acute on chronic HFpEF - mildly elevated BNP but below prior baseline, CXR without acute process. Not overally volume up by exam - likely exacerbated by RVR - work toward rate control, continue her oral lasix at this time. If ongong dyspnea once rate controlled could consider more aggressive diuresis     Dina Rich MD

## 2022-11-18 NOTE — Progress Notes (Signed)
Progress Note   Patient: [name] Petersen WUX:[id number] DOB: [birth year] DOA: 11/17/2022     0 DOS: the patient was seen and examined on 11/18/2022   Brief hospital admission narrative: As per Dr. Randol Kern H&P written on 11/17/2022 [name] Petersen  is a 89 y.o. female, with medical history significant for, stroke, peripheral artery disease, diabetes mellitus, left BKA.  With recent hospitalization due to A-fib with RVR, she is discharged on 11/10/2022, she was discharged on Cardizem CD to 40 mg oral daily, and her metoprolol has been discontinued as she reported has been giving her fatigue. -Patient was sent to ED today for uncontrolled heart rate, she was noted to have heart rate in the 140s-150s in the facility, so she was sent to ED for further evaluation, any chest pain, but she does report some dyspnea, but she has no hypoxia, no leg edema, no oxygen requirement, she denies fever, cough or chills -In ED patient was noted to have heart rate in the 150s, she was started on Cardizem drip, workup significant for stable creatinine at 1.27, potassium of 3.8, chest x-ray still pending, BNP still pending, [facility] hospitalist consulted to admit  Assessment and Plan: 1-A-fib with RVR -Currently third hospitalization secondary to uncontrolled atrial fibrillation -Responding to Cardizem drip along with oral diltiazem; will follow cardiology recommendations and further increase diltiazem to 60 mg 4 times daily in an attempt to wean patient off Cardizem drip -Patient previously unable to tolerate the use of beta-blocker -If unable to control rate will require initiation of amiodarone. -Continue Eliquis for secondary prevention.  2-diastolic heart failure -For the most part compensated; as present at the moment of admission some dyspnea probably driven by RVR component on presentation -Continue oral Lasix, daily weights and strict I's and O's -Low-sodium diet discussed with patient.  3-history of CVA -Continue  Eliquis for secondary prevention -Acutely demonstrated transient episode of right upper extremity weakness; CT scan demonstrating no acute intracranial hemorrhage.  Disease and new left outdate head infarct in comparison to previous CT scans but age indeterminant and most likely chronic presentation -Continue close monitoring and supportive care. -Continue risk factor modification.  4-chronic kidney disease stage III -Stable and at baseline -Continue to follow renal function trend/stability -Continue avoiding hypotension and minimizing nephrotoxic agents.  5-hypertension -Continue current antihypertensive agent -Healthy diet discussed with patient.  6-right breast cancer -Stage Ib triple positive -Continue letrozole and outpatient follow-up with oncology service.  7-peripheral vascular disease/phantom limb pain: Patient is status post left BKA -Continue gabapentin.  8-hyperlipidemia -Continue statin  9-type 2 diabetes with neuropathy -Currently controlled with diet -Continue to follow CBGs fluctuation -Continue the use of Neurontin.   Subjective:  Afebrile, no chest pain, no palpitations; appears to continue experiencing intermittent episodes of A-fib with RVR on telemetry evaluation.  Physical Exam: Vitals:   11/18/22 0700 11/18/22 0731 11/18/22 0800 11/18/22 1300  BP: (!) 161/96 121/73 (!) 152/83 (!) 145/112  Pulse: 71 73 84 77  Resp: 20 (!) 22 (!) 21 19  Temp: 98.1 F (36.7 C)     TempSrc: Oral     SpO2: 98% 99% 99% 94%  Weight:      Height:       General exam: Alert, awake, oriented x 3; no chest pain, no nausea vomiting. Respiratory system: Lungs, no using accessory muscles; normal respiratory effort. Mild Expiratory wheezing appreciated on exam. Cardiovascular system: Irregular irregular; no rubs or gallops. Gastrointestinal system: Abdomen is nondistended, soft and nontender. No organomegaly or masses felt.  Normal bowel sounds heard. Central nervous system:  Transient right upper extremity weakness and inability to keep limp elevated against gravity.  No other focal deficits. Extremities: No cyanosis or clubbing; left BKA. Skin: No petechiae. Psychiatry: Judgement and insight appear normal. Mood & affect appropriate.   Data Reviewed: Comprehensive metabolic panel: Sodium 137, potassium 4.0, chloride 102, bicarb 26, BUN 14, creatinine 1.16, normal LFTs; GFR 44.  Family Communication: Daughter At bedside.  Disposition: Status is: Inpatient Remains inpatient appropriate because: Continue treatment with IV Cardizem and follow recommendations by cardiology service to further manage A-fib with RVR.   Planned Discharge Destination: Assisted living facility  Time spent: 50 minutes  Author: Vassie Loll, MD 11/18/2022 5:24 PM  For on call review [email or web address].

## 2022-11-18 NOTE — Progress Notes (Addendum)
   11/18/22 1511  TOC Brief Assessment  Insurance and Status Reviewed  Patient has primary care physician Yes  Home environment has been reviewed From [facility]  Prior level of function: needs assistance  Prior/Current Home Services No current home services  Social Determinants of Health Reivew SDOH reviewed no interventions necessary  Readmission risk has been reviewed Yes  Transition of care needs no transition of care needs at this time   Cardiology following, plans to return to [facility] tomorrow. Daughter is planning to transport her back. Patient is active with [facility].

## 2022-11-19 ENCOUNTER — Inpatient Hospital Stay (HOSPITAL_COMMUNITY): Payer: Medicare Other

## 2022-11-19 DIAGNOSIS — I4891 Unspecified atrial fibrillation: Secondary | ICD-10-CM | POA: Diagnosis not present

## 2022-11-19 DIAGNOSIS — I639 Cerebral infarction, unspecified: Secondary | ICD-10-CM | POA: Diagnosis not present

## 2022-11-19 DIAGNOSIS — N183 Chronic kidney disease, stage 3 unspecified: Secondary | ICD-10-CM | POA: Diagnosis not present

## 2022-11-19 DIAGNOSIS — E782 Mixed hyperlipidemia: Secondary | ICD-10-CM | POA: Diagnosis not present

## 2022-11-19 DIAGNOSIS — I10 Essential (primary) hypertension: Secondary | ICD-10-CM | POA: Diagnosis not present

## 2022-11-19 MED ORDER — METOPROLOL TARTRATE 5 MG/5ML IV SOLN
5.0000 mg | Freq: Once | INTRAVENOUS | Status: AC
  Administered 2022-11-19: 5 mg via INTRAVENOUS
  Filled 2022-11-19: qty 5

## 2022-11-19 MED ORDER — AMIODARONE HCL IN DEXTROSE 360-4.14 MG/200ML-% IV SOLN
30.0000 mg/h | INTRAVENOUS | Status: DC
  Administered 2022-11-19 – 2022-11-20 (×2): 30 mg/h via INTRAVENOUS
  Filled 2022-11-19 (×3): qty 200

## 2022-11-19 MED ORDER — DILTIAZEM HCL 25 MG/5ML IV SOLN
15.0000 mg | Freq: Once | INTRAVENOUS | Status: AC
  Administered 2022-11-19: 15 mg via INTRAVENOUS
  Filled 2022-11-19: qty 5

## 2022-11-19 MED ORDER — SENNOSIDES-DOCUSATE SODIUM 8.6-50 MG PO TABS
1.0000 | ORAL_TABLET | Freq: Every day | ORAL | Status: DC
  Administered 2022-11-19 – 2022-11-22 (×4): 1 via ORAL
  Filled 2022-11-19 (×4): qty 1

## 2022-11-19 MED ORDER — AMIODARONE LOAD VIA INFUSION
150.0000 mg | Freq: Once | INTRAVENOUS | Status: AC
  Administered 2022-11-19: 150 mg via INTRAVENOUS
  Filled 2022-11-19: qty 83.34

## 2022-11-19 MED ORDER — AMIODARONE HCL IN DEXTROSE 360-4.14 MG/200ML-% IV SOLN
60.0000 mg/h | INTRAVENOUS | Status: AC
  Administered 2022-11-19 (×2): 60 mg/h via INTRAVENOUS
  Filled 2022-11-19 (×2): qty 200

## 2022-11-19 NOTE — Progress Notes (Signed)
Notified Dr Delrae Rend of HR continuing to be from 100s to 140s Afibb . The IV Cardizem one time dose helped only for a brief period of time. She was transitioned from the IV cardizem gtt yesterday to PO cardizem and during the night tonight she has not been well controlled w/ her rate.

## 2022-11-19 NOTE — Progress Notes (Signed)
Progress Note   Patient: [name] Petersen ZOX:[id number] DOB: 1927/03/23 DOA: 11/17/2022     1 DOS: the patient was seen and examined on 11/19/2022   Brief hospital admission narrative: As per Dr. Randol Kern H&P written on 11/17/2022 [name] Petersen  is a 89 y.o. female, with medical history significant for, stroke, peripheral artery disease, diabetes mellitus, left BKA.  With recent hospitalization due to A-fib with RVR, she is discharged on 11/10/2022, she was discharged on Cardizem CD to 40 mg oral daily, and her metoprolol has been discontinued as she reported has been giving her fatigue. -Patient was sent to ED today for uncontrolled heart rate, she was noted to have heart rate in the 140s-150s in the facility, so she was sent to ED for further evaluation, any chest pain, but she does report some dyspnea, but she has no hypoxia, no leg edema, no oxygen requirement, she denies fever, cough or chills -In ED patient was noted to have heart rate in the 150s, she was started on Cardizem drip, workup significant for stable creatinine at 1.27, potassium of 3.8, chest x-ray still pending, BNP still pending, [facility] hospitalist consulted to admit  Assessment and Plan: 1-A-fib with RVR -Currently third hospitalization secondary to uncontrolled atrial fibrillation -patient failed to be controlled with cardizem alone -per cardiology starting amiodarone -ok to continue oral cardizem if required. -Patient previously unable to tolerate the use of beta-blocker -If unable to control rate will require initiation of amiodarone. -Continue Eliquis for secondary prevention at the moment.  2-diastolic heart failure -For the most part compensated; as present at the moment of admission some dyspnea probably driven by RVR component on presentation -Continue oral Lasix, daily weights and strict I's and O's -Low-sodium diet discussed with patient.  3-history of CVA; with concerns for new stroke. -Continue Eliquis for  secondary prevention -on 11/18/22 Acutely demonstrated transient episode of right upper extremity weakness; CT scan demonstrating no acute intracranial hemorrhage. Patient's daughter not interested at the moment for further stroke work up. -by 11/19/22 symptoms have further worsen and after discussing with neurology and further discussing with family concenst given for MRI, MRA, carotid dopplers and base on results decision regarding future preventive management (possible eliquis failure if dealing with embolic source).  4-chronic kidney disease stage III -Stable and at baseline -Continue to follow renal function trend/stability -Continue avoiding hypotension and minimizing nephrotoxic agents.  5-hypertension -Continue current antihypertensive agent -Healthy diet discussed with patient.  6-right breast cancer -Stage Ib triple positive -Continue letrozole and outpatient follow-up with oncology service.  7-peripheral vascular disease/phantom limb pain: Patient is status post left BKA -Continue gabapentin.  8-hyperlipidemia -Continue statin  9-type 2 diabetes with neuropathy -Currently controlled with diet -Continue to follow CBGs fluctuation -Continue the use of Neurontin.  10-physical deconditioning -Patient has been seen by physical therapy with recommendations for SNF at discharge.   Subjective:  Patient continues to experience irregular irregular rate/breathing with requirement of amiodarone drip per cardiology recommendations.  Physical Exam: Vitals:   11/19/22 1300 11/19/22 1602 11/19/22 1609 11/19/22 1700  BP: 135/77 (!) 166/94  118/73  Pulse: 78 (!) 119  (!) 122  Resp: 13 20  (!) 21  Temp:   (!) 97.5 F (36.4 C)   TempSrc:   Axillary   SpO2: 98% 95%  97%  Weight:      Height:       General exam: Per family mentation not at baseline; patient demonstrating right-sided weakness, slumping to the right and mild right facial  droop. Respiratory system: 1-2 L nasal cannula  supplementation in place; reporting no orthopnea and no shortness of breath. Cardiovascular system: Irregularly irregular; no rubs, no gallops, no JVD on exam. Gastrointestinal system: Abdomen is obese, nondistended, soft and nontender. No organomegaly or masses felt. Normal bowel sounds heard. Central nervous system: Able to communicate without any troubles and was able to follow simple commands.  Significant right-sided weakness, lack of coordination especially in her upper extremity and the rest concern for mild facial droop. Extremities: No cyanosis or clubbing; left BKA.  No edema on her right lower extremity. Skin: No petechiae. Psychiatry: Mood & affect appropriate.   Data Reviewed: Comprehensive metabolic panel: Sodium 137, potassium 4.0, chloride 102, bicarb 26, BUN 14, creatinine 1.16, normal LFTs; GFR 44.  Family Communication: Daughter At bedside.  Disposition: Status is: Inpatient Remains inpatient appropriate because: Continue treatment with IV Cardizem and follow recommendations by cardiology service to further manage A-fib with RVR.   Planned Discharge Destination: Assisted living facility  Time spent: 50 minutes  Author: Vassie Loll, MD 11/19/2022 5:36 PM  For on call review [email or web address].

## 2022-11-19 NOTE — Consult Note (Signed)
Neurology Consultation  Reason for Consult: Stroke Referring Physician: Dr. Gwenlyn Perking  CC: Right-sided weakness  History is obtained from: Chart, patient's family  HPI: [name] Petersen is a 89 y.o. female past history of breast cancer, left BKA, diabetes, peripheral artery disease, macular degeneration, atrial fibrillation on anticoagulation with Eliquis admitted for treatment of A-fib with RVR, chronic heart failure and hypertension with complaints on admission of transient right-sided weakness that had resolved.  Yesterday somewhere around 2 PM the family noted that she was having trouble lifting her right arm and she complained that her right arm feels very heavy.  The symptoms were fluctuating with 1 daughter telling me that she was able to see the patient brush her teeth with the right hand in the evening but earlier in the afternoon the patient was unable to use the right hand to hold her spoon and fork. Due to the clinical concern for stroke, neurological consultation was obtained. Patient has a prior history of strokes with no big residual weakness. She has had a progressive decline in her functional status and family feels that her quality of life has been extremely poor over the last few months but she remains mentally sharp.  Family does not wish to pursue any aggressive measures   LKW: Unclear-there was some question of weakness prior to admission but that had either resolved or improved with more sudden changes somewhere around 2 PM yesterday. IV thrombolysis given?: no, outside the window by the time neurology consultation was obtained Premorbid modified Rankin scale (mRS):4-5  ROS: Full ROS was performed and is negative except as noted in the HPI.  Past Medical History:  Diagnosis Date   A-fib [facility])    Arthritis    hands   Atherosclerotic PVD with ulceration (HCC) 07/15/2017   RIGHT HEEL   Breast cancer (HCC)    Cancer (HCC)    Complication of anesthesia    lost eyesight for  a few days after surgery in the 1950's   Constipation    Diabetes mellitus without complication (HCC)    no medications    Hypertension    Macular degeneration, bilateral    "one eye wet; one eye dry; she gets shots" (07/07/2017)   PAD (peripheral artery disease) (HCC)    Pneumonia    Stroke (HCC)    some trouble swallowing    Family History  Adopted: Yes    Social History:   reports that she has never smoked. She has never used smokeless tobacco. She reports that she does not drink alcohol and does not use drugs.  Medications  Current Facility-Administered Medications:    acetaminophen (TYLENOL) tablet 650 mg, 650 mg, Oral, Q4H PRN, Elgergawy, Leana Roe, MD, 650 mg at 11/19/22 1002   albuterol (PROVENTIL) (2.5 MG/3ML) 0.083% nebulizer solution 2.5 mg, 2.5 mg, Nebulization, Once, Zierle-Ghosh, Asia B, DO   [COMPLETED] amiodarone (NEXTERONE) 1.8 mg/mL load via infusion 150 mg, 150 mg, Intravenous, Once, 150 mg at 11/19/22 0920 **FOLLOWED BY** amiodarone (NEXTERONE PREMIX) 360-4.14 MG/200ML-% (1.8 mg/mL) IV infusion, 60 mg/hr, Intravenous, Continuous, Last Rate: 33.3 mL/hr at 11/19/22 1210, 60 mg/hr at 11/19/22 1210 **FOLLOWED BY** amiodarone (NEXTERONE PREMIX) 360-4.14 MG/200ML-% (1.8 mg/mL) IV infusion, 30 mg/hr, Intravenous, Continuous, Strader, [place] M, PA-C   apixaban (ELIQUIS) tablet 5 mg, 5 mg, Oral, BID, Elgergawy, Leana Roe, MD, 5 mg at 11/19/22 1610   Chlorhexidine Gluconate Cloth 2 % PADS 6 each, 6 each, Topical, Q0600, Elgergawy, Leana Roe, MD, 6 each at 11/19/22 [phone]  cholecalciferol (VITAMIN D3) 25 MCG (1000 UNIT) tablet 2,000 Units, 2,000 Units, Oral, Daily, Elgergawy, Leana Roe, MD, 2,000 Units at 11/19/22 1324   cyanocobalamin (VITAMIN B12) tablet 1,000 mcg, 1,000 mcg, Oral, Daily, Elgergawy, Leana Roe, MD, 1,000 mcg at 11/19/22 4010   diltiazem (CARDIZEM) tablet 60 mg, 60 mg, Oral, QID, Antoine Poche, MD, 60 mg at 11/19/22 2725   docusate sodium (COLACE) capsule 100  mg, 100 mg, Oral, BID, Vassie Loll, MD, 100 mg at 11/19/22 3664   furosemide (LASIX) tablet 20 mg, 20 mg, Oral, Daily, Elgergawy, Leana Roe, MD, 20 mg at 11/19/22 4034   gabapentin (NEURONTIN) capsule 100 mg, 100 mg, Oral, Daily, Elgergawy, Leana Roe, MD, 100 mg at 11/19/22 7425   guaiFENesin-dextromethorphan (ROBITUSSIN DM) 100-10 MG/5ML syrup 5 mL, 5 mL, Oral, Q4H PRN, Elgergawy, Leana Roe, MD, 5 mL at 11/18/22 0546   hydrALAZINE (APRESOLINE) injection 5 mg, 5 mg, Intravenous, Q6H PRN, Elgergawy, Leana Roe, MD, 5 mg at 11/18/22 2248   letrozole [facility]) tablet 2.5 mg, 2.5 mg, Oral, Daily, Elgergawy, Leana Roe, MD, 2.5 mg at 11/19/22 1126   multivitamin with minerals tablet 1 tablet, 1 tablet, Oral, Daily, Elgergawy, Leana Roe, MD, 1 tablet at 11/19/22 0922   ondansetron (ZOFRAN) injection 4 mg, 4 mg, Intravenous, Q6H PRN, Elgergawy, Leana Roe, MD   polyethylene glycol (MIRALAX / GLYCOLAX) packet 17 g, 17 g, Oral, Daily, Vassie Loll, MD, 17 g at 11/19/22 9563   pravastatin (PRAVACHOL) tablet 20 mg, 20 mg, Oral, q1800, Elgergawy, Leana Roe, MD, 20 mg at 11/18/22 1745  Exam: Current vital signs: BP (!) 148/101   Pulse (!) 104   Temp (!) 97.5 F (36.4 C) (Oral)   Resp 16   Ht 5\' 2"  (1.575 m)   Wt 73.5 kg   SpO2 98%   BMI 29.64 kg/m  Vital signs in last 24 hours: Temp:  [97.5 F (36.4 C)-98.7 F (37.1 C)] 97.5 F (36.4 C) (08/02 1143) Pulse Rate:  [27-149] 104 (08/02 1000) Resp:  [15-37] 16 (08/02 1000) BP: (111-178)/(65-106) 148/101 (08/02 1000) SpO2:  [93 %-99 %] 98 % (08/02 1000) Weight:  [73.5 kg] 73.5 kg (08/02 0500) General: Sleeping in bed.  Awakens to voice.  Keeps falling asleep during conversation. HEENT: Normocephalic atraumatic Lungs: Clear Cardiovascular: Irregularly irregular Abdomen nondistended nontender Extremities: Left BKA, no edema. Neurological exam Awake alert oriented x 2. Mildly dysarthric speech No evidence of aphasia.  Able to follow simple  commands. Poor attention concentration Cranial nerves II to XII: Pupils equal round react light, extraocular movements appear unhindered, question some right upper quadrantanopsia, subtle right lower facial weakness but family reports that face has been somewhat asymmetric due to partial dentures on the right, facial sensation intact, tongue and palate midline. Motor examination with 0/5 proximal upper extremity strength on the right.  Grip strength on the right 1-2/5 at best.  Right lower extremity is barely 1/5.  Left upper extremity has 4/5 strength.  Left lower extremity at the hip also has 4/5 strength. Sensation intact to touch without extinction Coordination with no gross dysmetria NIHSS 1a Level of Conscious.: 0 1b LOC Questions: 2 1c LOC Commands: 0 2 Best Gaze: 0 3 Visual: 1 4 Facial Palsy: 1 5a Motor Arm - left: 0 5b Motor Arm - Right: 3 6a Motor Leg - Left: 0 6b Motor Leg - Right: 3 7 Limb Ataxia: 0 8 Sensory: 0 9 Best Language: 0 10 Dysarthria: 1 11 Extinct. and Inatten.: 0 TOTAL: 11  Labs I have reviewed labs in epic and the results pertinent to this consultation are: CBC    Component Value Date/Time   WBC 6.2 11/17/2022 1359   RBC 4.25 11/17/2022 1359   HGB 12.2 11/17/2022 1359   HCT 39.0 11/17/2022 1359   PLT 270 11/17/2022 1359   MCV 91.8 11/17/2022 1359   MCH 28.7 11/17/2022 1359   MCHC 31.3 11/17/2022 1359   RDW 14.6 11/17/2022 1359   LYMPHSABS 1.4 11/17/2022 1359   MONOABS 0.8 11/17/2022 1359   EOSABS 0.1 11/17/2022 1359   BASOSABS 0.0 11/17/2022 1359    CMP     Component Value Date/Time   NA 135 11/19/2022 0002   K 4.1 11/19/2022 0002   CL 100 11/19/2022 0002   CO2 26 11/19/2022 0002   GLUCOSE 162 (H) 11/19/2022 0002   BUN 14 11/19/2022 0002   CREATININE 1.19 (H) 11/19/2022 0002   CALCIUM 9.1 11/19/2022 0002   PROT 5.8 (L) 11/18/2022 0505   ALBUMIN 3.2 (L) 11/18/2022 0505   AST 20 11/18/2022 0505   ALT 17 11/18/2022 0505   ALKPHOS 74  11/18/2022 0505   BILITOT 0.5 11/18/2022 0505   GFRNONAA 43 (L) 11/19/2022 0002   GFRAA 42 (L) 07/26/2017 0731    Lab Results  Component Value Date   HGBA1C 7.8 (H) 09/11/2022      Imaging I have reviewed the images obtained:  CT-head: Compared with 2008-left caudate hypodensity which is new.  Age-related chronic white matter disease.  Generalized brain atrophy.   2D echocardiogram done in May 2024-EF 60 to 65%, mitral valve normal, aortic valve tricuspid with no AR, LA normal size, RA normal size.  No atrial level shunt detected by color-flow Doppler.  Assessment:  89 year old with above past medical history including that of atrial fibrillation on Eliquis admitted for A-fib with RVR and transient right-sided weakness with again recurrence of right upper extremity weakness now to the point where she is plegic on the right side. Unclear as to what her last known well was but according to family the change in symptoms meaning the complete right-sided weakness happened sometime 2 PM yesterday but then again she was able to use some function of her right arm by the evening as witnessed by one of the family members who saw her brushing her teeth with that right hand. Clinically she does appear to have a stroke affecting the left hemisphere and it is difficult to assess whether the visual field cut was true or not because she also has macular degeneration but mostly appears consistent with a pure motor lacunar infarction. If she indeed has embolic appearing strokes on MRI, there might need to be consideration for changing anticoagulation considering this to be Eliquis failure.  If the stroke on the MRI appears to be small vessel etiology, she can continue with Eliquis.  Impression: Acute ischemic stroke  Recommendations: Telemetry Frequent neurochecks For now continue Eliquis If the MRI shows a small vessel etiology stroke-it is okay to continue Eliquis. If the MRI shows scattered  embolic looking infarcts-would recommend changing Eliquis to Pradaxa. MR angio head contrast, carotid Dopplers for completion of risk factor w/u Check lipid panel - statin for goal LDL goal <70. For now continue home pravastatin. A1c at goal PT OT ST The echocardiogram done less than 3 months ago-no need to repeat as unlikely to change management Permissive hypertension - for next 24 hours or so allow permissive hypertension and, treat SBP >180 on a PRN basis (lower  goal of permissive hypertension with the fact that she is on blood thinner).  After 24 hours, start normalizing blood pressures with a goal blood pressure discharge of 140/90 or below.  Follow-up with outpatient neurology-[place] [facility] in 8 to 12 weeks.  Neurology services are not available at [facility] tomorrow but I have provided my cell phone number to the primary hospitalist for questions as needed-please feel free to reach out with questions as needed.   -- Milon Dikes, MD Neurologist [facility] Pager: [phone]

## 2022-11-19 NOTE — Progress Notes (Signed)
Rounding Note    Patient Name: [name] Petersen Date of Encounter: 11/19/2022  [facility] Cardiologist: New   Subjective   Patient says her breathing is OK  No palpitations  No dizzienss   Inpatient Medications    Scheduled Meds:  albuterol  2.5 mg Nebulization Once   apixaban  5 mg Oral BID   Chlorhexidine Gluconate Cloth  6 each Topical Q0600   cholecalciferol  2,000 Units Oral Daily   cyanocobalamin  1,000 mcg Oral Daily   diltiazem  60 mg Oral QID   docusate sodium  100 mg Oral BID   furosemide  20 mg Oral Daily   gabapentin  100 mg Oral Daily   letrozole  2.5 mg Oral Daily   multivitamin with minerals  1 tablet Oral Daily   polyethylene glycol  17 g Oral Daily   pravastatin  20 mg Oral q1800   Continuous Infusions:  amiodarone 60 mg/hr (11/19/22 1210)   Followed by   amiodarone     PRN Meds: acetaminophen, guaiFENesin-dextromethorphan, hydrALAZINE, ondansetron (ZOFRAN) IV   Vital Signs    Vitals:   11/19/22 0800 11/19/22 0900 11/19/22 1000 11/19/22 1143  BP: (!) 129/100 (!) 149/99 (!) 148/101   Pulse: (!) 132 (!) 137 (!) 104   Resp: (!) 22 18 16    Temp:    (!) 97.5 F (36.4 C)  TempSrc:    Oral  SpO2: 97% 96% 98%   Weight:      Height:        Intake/Output Summary (Last 24 hours) at 11/19/2022 1346 Last data filed at 11/19/2022 0500 Gross per 24 hour  Intake 335.27 ml  Output 500 ml  Net -164.73 ml      11/19/2022    5:00 AM 11/17/2022    4:21 PM 11/11/2022    5:31 AM  Last 3 Weights  Weight (lbs) 162 lb 0.6 oz 163 lb 5.8 oz 158 lb 11.7 oz  Weight (kg) 73.5 kg 74.1 kg 72 kg      Telemetry    Afib   Average 100s  - Personally Reviewed  ECG    No new  - Personally Reviewed  Physical Exam   GEN: No acute distress.   Neck: No JVD Cardiac: Irreg irreg  No S3  No murmurs  Respiratory: Clear to auscultation bilaterally. GI: Soft, nontender, non-distended  MS: No edema; s/p L BKA   Neuro:  Nonfocal  Psych: Normal affect   Labs     High Sensitivity Troponin:   Recent Labs  Lab 11/11/22 0630  TROPONINIHS 10     Chemistry Recent Labs  Lab 11/17/22 1359 11/18/22 0505 11/19/22 0002  NA 136 137 135  K 3.8 4.0 4.1  CL 101 102 100  CO2 26 26 26   GLUCOSE 183* 148* 162*  BUN 15 14 14   CREATININE 1.27* 1.16* 1.19*  CALCIUM 8.7* 8.8* 9.1  MG 2.0  --  2.1  PROT 5.9* 5.8*  --   ALBUMIN 3.2* 3.2*  --   AST 23 20  --   ALT 18 17  --   ALKPHOS 75 74  --   BILITOT 0.7 0.5  --   GFRNONAA 39* 44* 43*  ANIONGAP 9 9 9     Lipids No results for input(s): "CHOL", "TRIG", "HDL", "LABVLDL", "LDLCALC", "CHOLHDL" in the last 168 hours.  Hematology Recent Labs  Lab 11/17/22 1359  WBC 6.2  RBC 4.25  HGB 12.2  HCT 39.0  MCV  91.8  MCH 28.7  MCHC 31.3  RDW 14.6  PLT 270   Thyroid  Recent Labs  Lab 11/17/22 1359  TSH 1.171    BNP Recent Labs  Lab 11/17/22 1359  BNP 244.0*    DDimer No results for input(s): "DDIMER" in the last 168 hours.   Radiology    CT HEAD WO CONTRAST ( )  Result Date: 11/18/2022 CLINICAL DATA:  Transient ischemic attack (TIA) EXAM: CT HEAD WITHOUT CONTRAST TECHNIQUE: Contiguous axial images were obtained from the base of the skull through the vertex without intravenous contrast. RADIATION DOSE REDUCTION: This exam was performed according to the departmental dose-optimization program which includes automated exposure control, adjustment of the mA and/or kV according to patient size and/or use of iterative reconstruction technique. COMPARISON:  CT Head 04/18/07 FINDINGS: Brain: No evidence of acute infarction, hemorrhage, hydrocephalus, extra-axial collection or mass lesion/mass effect. Sequela of moderate chronic microvascular ischemic change. There is chronic infarct in the right basal ganglia. Compared to prior exam there is a new infarct in the left caudate head. Vascular: No hyperdense vessel or unexpected calcification. Skull: Normal. Negative for fracture or focal lesion.  Sinuses/Orbits: No middle ear or mastoid effusion. Paranasal sinuses are clear. Bilateral lens replacement. Orbits are otherwise unremarkable. Other: None. IMPRESSION: Compared to prior exam there is a new infarct in the left caudate head, which is likely chronic, but age indeterminate. No evidence of acute intracranial hemorrhage. Electronically Signed   By: Lorenza Cambridge M.D.   On: 11/18/2022 14:22   DG Chest Port 1 View  Result Date: 11/17/2022 CLINICAL DATA:  Dyspnea EXAM: PORTABLE CHEST 1 VIEW COMPARISON:  11/11/2022 FINDINGS: Borderline cardiopericardial silhouette. Calcified aorta. Slight linear opacity lung bases likely scar or atelectasis. No pneumothorax, effusion or consolidation. Film is rotated to the left. Overlapping cardiac leads. Osteopenia. Degenerative changes of the spine and shoulders. IMPRESSION: Mild basilar atelectasis.  Rotated radiograph. Electronically Signed   By: Karen Kays M.D.   On: 11/17/2022 16:16    Cardiac Studies     Patient Profile     89 y.o. female  hx of PAF   PRsents with increased dyspnea  Found to be in afib   Assessment & Plan   1  Atrial fibrillation   This is now the 3rd admission for this patient for atrial fibrillation She has not done well after the past 2 discharges  She was controled with IV then PO cardiazem but then back in afib with RVR and dyspnea How there heart rates are improved from admission. She has received digoxin and cardiazem IV.    She appears comfortable in bed  HR still 100  I would recomm starting amiodarone to get better rate and possible rhythm control  Goal would be to keep this patient out of the hospital Can start with IV to hasten load then switch to PO (200 bid when goes home) Can keep on diltiazem   Follow HR  May need to decrease dose as HR goes down  Follow   NO more digoxin     Will make sure that she has outpt appt schedule if goes home over weekend.    For questions or updates, please contact [facility] Please consult [email or web address] for contact info under        Signed, Dietrich Pates, MD  11/19/2022, 1:46 PM

## 2022-11-19 NOTE — Progress Notes (Signed)
Lying in bed eyes closed and resting. HR Afibb and has been rate controlled 70s/80s most of night until the last hour and now her HR is 115-140s and just up and down. Notified Dr Delrae Rend. See  new order.

## 2022-11-19 NOTE — Plan of Care (Signed)
  Problem: Acute Rehab PT Goals(only PT should resolve) Goal: Pt will Roll Supine to Side Outcome: Progressing Flowsheets (Taken 11/19/2022 1601) Pt will Roll Supine to Side: with mod assist Goal: Pt Will Go Supine/Side To Sit Outcome: Progressing Flowsheets (Taken 11/19/2022 1601) Pt will go Supine/Side to Sit:  with moderate assist  with maximum assist Goal: Pt Will Go Sit To Supine/Side Outcome: Progressing Flowsheets (Taken 11/19/2022 1601) Pt will go Sit to Supine/Side:  with moderate assist  with maximum assist Goal: Patient Will Perform Sitting Balance Outcome: Progressing Flowsheets (Taken 11/19/2022 1601) Patient will perform sitting balance:  with minimal assist  with moderate assist Goal: Patient Will Transfer Sit To/From Stand Outcome: Progressing Goal: Pt Will Transfer Bed To Chair/Chair To Bed Outcome: Progressing   4:02 PM, 11/19/22 [name] Petersen, MPT Physical Therapist with [facility] 336 [phone] office [phone] mobile phone

## 2022-11-19 NOTE — TOC Initial Note (Addendum)
Transition of Care [facility]) - Initial/Assessment Note    Patient Details  Name: [name] Petersen MRN: [id number] Date of Birth: Sep 07, 1926  Transition of Care [facility]) CM/SW Contact:    Leitha Bleak, RN Phone Number: 11/19/2022, 3:12 PM  Clinical Narrative:          Chip Boer is here to assess patient. CM at the bedside with the family. Patient is in MRI. Per family patient's rights side weakness. This is new, Chip Boer and family agrees she will need SNF. PT eval is pending. [facility]. FL2 started, TOC following.     Addendum:  [facility] - patient does not match the SS#. TOC verifying with Chip Boer they have [phone]  [facility] has [phone].  Family will bring in proof tomorrow so we can update the [facility] or  must system.  TOC follow for PT eval.   Expected Discharge Plan: Skilled Nursing Facility Barriers to Discharge: Continued Medical Work up   Patient Goals and CMS Choice      Expected Discharge Plan and Services         Expected Discharge Date: 11/19/22                                    Prior Living Arrangements/Services                       Activities of Daily Living Home Assistive Devices/Equipment: Wheelchair ADL Screening (condition at time of admission) Patient's cognitive ability adequate to safely complete daily activities?: Yes Is the patient deaf or have difficulty hearing?: No Does the patient have difficulty seeing, even when wearing glasses/contacts?: No Does the patient have difficulty concentrating, remembering, or making decisions?: No Patient able to express need for assistance with ADLs?: Yes Does the patient have difficulty dressing or bathing?: No Independently performs ADLs?: Yes (appropriate for developmental age) Communication: Independent Dressing (OT): Independent with device (comment), Appropriate for developmental age Grooming: Independent with device (comment), Appropriate  for developmental age Feeding: Independent with device (comment), Appropriate for developmental age Bathing: Independent, Independent with device (comment), Appropriate for developmental age Toileting: Independent with device (comment), Appropriate for developmental age In/Out Bed: Appropriate for developmental age, Independent with device (comment) Walks in Home: Appropriate for developmental age Is this a change from baseline?: Pre-admission baseline Does the patient have difficulty walking or climbing stairs?: Yes Weakness of Legs: Both Weakness of Arms/Hands: None  Permission Sought/Granted                  Emotional Assessment              Admission diagnosis:  Atrial flutter with rapid ventricular response (HCC) [I48.92] Atrial fibrillation with RVR (HCC) [I48.91] Patient Active Problem List   Diagnosis Date Noted   Atrial flutter with rapid ventricular response (HCC) 11/10/2022   Acute diastolic (congestive) heart failure (HCC) 11/09/2022   Atrial fibrillation with RVR (HCC) 09/11/2022   History of stroke 08/21/2020   Hyperlipidemia 08/21/2020   Urinary retention    Phantom limb pain (HCC)    Unilateral complete BKA, left, sequela (HCC)    Slow transit constipation    Atherosclerotic PVD with ulceration (HCC) 07/14/2017   Pressure injury of right heel, stage 3 (HCC)    Sleep disturbance    Poor nutrition    Hypokalemia    Drug-induced constipation    Essential  hypertension    Stage 3 chronic kidney disease (HCC)    Postoperative pain    Type 2 diabetes mellitus with obesity (HCC)    S/P below knee amputation, left (HCC) 07/10/2017   PAD (peripheral artery disease) (HCC) 07/06/2017   Atherosclerosis of native arteries of the extremities with ulceration (HCC) 06/16/2017   PCP:  Pcp, No Pharmacy:   [facility] - [place], [place] - [address] AT [address] OF [address] [place] [address] Phone:  [phone] Fax: [phone]  [facility] [place], [place] [address] [place] [address] Phone: [phone] Fax: [phone]     Social Determinants of Health (SDOH) Social History: SDOH Screenings   Food Insecurity: No Food Insecurity (11/17/2022)  Housing: Patient Declined (11/17/2022)  Transportation Needs: No Transportation Needs (11/17/2022)  Utilities: Not At Risk (11/17/2022)  Financial Resource Strain: Low Risk  (06/29/2022)   [facility], [facility]  Physical Activity: Inactive (06/29/2022)   Received from [facility], [facility]  Social Connections: Moderately Integrated (06/29/2022)   [facility] from [facility], [facility]  Stress: No Stress Concern Present (06/29/2022)   Received from [facility], [facility]  Tobacco Use: Low Risk  (11/17/2022)  Health Literacy: Low Risk  (06/29/2022)   Received from [facility], [facility]   SDOH Interventions:     Readmission Risk Interventions    11/19/2022    3:12 PM 08/26/2020   11:01 AM  Readmission Risk Prevention Plan  Transportation Screening Complete Complete  PCP or Specialist Appt within 5-7 Days Not Complete   Home Care Screening Complete Complete  Medication Review (RN CM) Complete Complete

## 2022-11-19 NOTE — Evaluation (Signed)
Physical Therapy Evaluation Patient Details Name: [name] Petersen MRN: [id number] DOB: 01-27-1927 Today's Date: 11/19/2022  History of Present Illness  [name] Petersen  is a 89 y.o. female, with medical history significant for, stroke, peripheral artery disease, diabetes mellitus, left BKA.  With recent hospitalization due to A-fib with RVR, she is discharged on 11/10/2022, she was discharged on Cardizem CD to 40 mg oral daily, and her metoprolol has been discontinued as she reported has been giving her fatigue.  -Patient was sent to ED today for uncontrolled heart rate, she was noted to have heart rate in the 140s-150s in the facility, so she was sent to ED for further evaluation, any chest pain, but she does report some dyspnea, but she has no hypoxia, no leg edema, no oxygen requirement, she denies fever, cough or chills   Clinical Impression  Patient demonstrates slow labored movement for sitting up at bedside, once seated had frequent leaning to the right and falling forward, unable to stand or transfer to chair due to right sided weakness with inability to lift against gravity.  Patient required Max assist for repositioning when put back to bed.  Patient will benefit from continued skilled physical therapy in hospital and recommended venue below to increase strength, balance, endurance for safe ADLs and gait.          If plan is discharge home, recommend the following: A lot of help with bathing/dressing/bathroom;A lot of help with walking and/or transfers;Assistance with cooking/housework;Help with stairs or ramp for entrance   Can travel by private vehicle   No    Equipment Recommendations None recommended by PT  Recommendations for Other Services       Functional Status Assessment Patient has had a recent decline in their functional status and demonstrates the ability to make significant improvements in function in a reasonable and predictable amount of time.     Precautions /  Restrictions Precautions Precautions: Fall Restrictions Weight Bearing Restrictions: No      Mobility  Bed Mobility Overal bed mobility: Needs Assistance Bed Mobility: Supine to Sit, Sit to Supine     Supine to sit: Max assist Sit to supine: Max assist   General bed mobility comments: slow labored movement, with limited to no use of RUE    Transfers                        Ambulation/Gait                  Stairs            Wheelchair Mobility     Tilt Bed    Modified Rankin (Stroke Patients Only)       Balance Overall balance assessment: Needs assistance Sitting-balance support: Feet supported, Bilateral upper extremity supported Sitting balance-Leahy Scale: Poor Sitting balance - Comments: frequent falling foward and to the right Postural control: Right lateral lean                                   Pertinent Vitals/Pain Pain Assessment Pain Assessment: Faces Faces Pain Scale: Hurts little more Pain Location: with movement of right side Pain Descriptors / Indicators: Sore, Discomfort, Grimacing Pain Intervention(s): Limited activity within patient's tolerance, Monitored during session, Repositioned    Home Living Family/patient expects to be discharged to:: Assisted living  Home Equipment: Wheelchair - manual      Prior Function Prior Level of Function : Needs assist       Physical Assist : Mobility (physical);ADLs (physical) Mobility (physical): Bed mobility;Transfers   Mobility Comments: Asissted transfers to wheelchair,  propells self in wheelchair, non-ambulatory ADLs Comments: assisted by ALF staff     Hand Dominance   Dominant Hand: Right    Extremity/Trunk Assessment   Upper Extremity Assessment Upper Extremity Assessment: Generalized weakness;RUE deficits/detail RUE Deficits / Details: grossly 1/5, unable to lift against gravity, trace movement noted RUE Sensation:  decreased proprioception RUE Coordination: decreased fine motor;decreased gross motor    Lower Extremity Assessment Lower Extremity Assessment: Generalized weakness;RLE deficits/detail RLE Deficits / Details: grossly 1/5, trace movement noted, unable to move against gravity RLE Sensation: decreased light touch;decreased proprioception RLE Coordination: decreased fine motor;decreased gross motor    Cervical / Trunk Assessment Cervical / Trunk Assessment: Kyphotic  Communication   Communication: No difficulties  Cognition Arousal/Alertness: Awake/alert Behavior During Therapy: WFL for tasks assessed/performed Overall Cognitive Status: Within Functional Limits for tasks assessed                                          General Comments      Exercises     Assessment/Plan    PT Assessment Patient needs continued PT services  PT Problem List Decreased strength;Decreased activity tolerance;Decreased balance;Decreased mobility;Decreased coordination;Impaired sensation       PT Treatment Interventions DME instruction;Functional mobility training;Therapeutic activities;Therapeutic exercise;Patient/family education;Wheelchair mobility training;Balance training    PT Goals (Current goals can be found in the Care Plan section)  Acute Rehab PT Goals Patient Stated Goal: return home (ALF) PT Goal Formulation: With patient/family Time For Goal Achievement: 12/03/22 Potential to Achieve Goals: Fair    Frequency Min 3X/week     Co-evaluation               AM-PAC PT "6 Clicks" Mobility  Outcome Measure Help needed turning from your back to your side while in a flat bed without using bedrails?: A Lot Help needed moving from lying on your back to sitting on the side of a flat bed without using bedrails?: A Lot Help needed moving to and from a bed to a chair (including a wheelchair)?: Total Help needed standing up from a chair using your arms (e.g., wheelchair  or bedside chair)?: Total Help needed to walk in hospital room?: Total Help needed climbing 3-5 steps with a railing? : Total 6 Click Score: 8    End of Session   Activity Tolerance: Patient tolerated treatment well;Patient limited by fatigue Patient left: in bed;with call bell/phone within reach;with family/visitor present Nurse Communication: Mobility status PT Visit Diagnosis: Unsteadiness on feet (R26.81);Other abnormalities of gait and mobility (R26.89);Muscle weakness (generalized) (M62.81)    Time: 0272-5366 PT Time Calculation (min) (ACUTE ONLY): 24 min   Charges:   PT Evaluation $PT Eval Moderate Complexity: 1 Mod PT Treatments $Therapeutic Activity: 23-37 mins PT General Charges $$ ACUTE PT VISIT: 1 Visit         3:59 PM, 11/19/22 Ocie Bob, MPT Physical Therapist with [facility] 336 [phone] office [phone] mobile phone

## 2022-11-20 DIAGNOSIS — I10 Essential (primary) hypertension: Secondary | ICD-10-CM | POA: Diagnosis not present

## 2022-11-20 DIAGNOSIS — I4891 Unspecified atrial fibrillation: Secondary | ICD-10-CM | POA: Diagnosis not present

## 2022-11-20 DIAGNOSIS — N183 Chronic kidney disease, stage 3 unspecified: Secondary | ICD-10-CM | POA: Diagnosis not present

## 2022-11-20 DIAGNOSIS — E782 Mixed hyperlipidemia: Secondary | ICD-10-CM | POA: Diagnosis not present

## 2022-11-20 LAB — LIPID PANEL
Cholesterol: 118 mg/dL (ref 0–200)
HDL: 40 mg/dL — ABNORMAL LOW (ref >40)
LDL Cholesterol: 64 mg/dL (ref 0–99)
Total CHOL/HDL Ratio: 3 RATIO
Triglycerides: 70 mg/dL (ref <150)
VLDL: 14 mg/dL (ref 0–40)

## 2022-11-20 MED ORDER — BISACODYL 10 MG RE SUPP
10.0000 mg | Freq: Every day | RECTAL | Status: DC | PRN
  Administered 2022-11-20: 10 mg via RECTAL
  Filled 2022-11-20: qty 1

## 2022-11-20 MED ORDER — PANTOPRAZOLE SODIUM 40 MG PO TBEC
40.0000 mg | DELAYED_RELEASE_TABLET | Freq: Every day | ORAL | Status: DC
  Administered 2022-11-20 – 2022-11-23 (×4): 40 mg via ORAL
  Filled 2022-11-20 (×4): qty 1

## 2022-11-20 MED ORDER — ROPINIROLE HCL 0.25 MG PO TABS
0.2500 mg | ORAL_TABLET | Freq: Two times a day (BID) | ORAL | Status: DC
  Administered 2022-11-20 – 2022-11-21 (×2): 0.25 mg via ORAL
  Filled 2022-11-20 (×2): qty 1

## 2022-11-20 MED ORDER — LEVALBUTEROL HCL 0.63 MG/3ML IN NEBU
0.6300 mg | INHALATION_SOLUTION | Freq: Four times a day (QID) | RESPIRATORY_TRACT | Status: DC | PRN
  Administered 2022-11-20: 0.63 mg via RESPIRATORY_TRACT
  Filled 2022-11-20: qty 3

## 2022-11-20 MED ORDER — POLYETHYLENE GLYCOL 3350 17 G PO PACK
17.0000 g | PACK | Freq: Two times a day (BID) | ORAL | Status: DC
  Administered 2022-11-20 – 2022-11-23 (×5): 17 g via ORAL
  Filled 2022-11-20 (×5): qty 1

## 2022-11-20 MED ORDER — ASPIRIN 81 MG PO TBEC
81.0000 mg | DELAYED_RELEASE_TABLET | Freq: Every day | ORAL | Status: DC
  Administered 2022-11-20 – 2022-11-22 (×3): 81 mg via ORAL
  Filled 2022-11-20 (×3): qty 1

## 2022-11-20 MED ORDER — DILTIAZEM HCL ER COATED BEADS 300 MG PO CP24
300.0000 mg | ORAL_CAPSULE | Freq: Every day | ORAL | Status: DC
  Administered 2022-11-20 – 2022-11-21 (×2): 300 mg via ORAL
  Filled 2022-11-20 (×3): qty 1

## 2022-11-20 NOTE — Plan of Care (Signed)

## 2022-11-20 NOTE — Progress Notes (Signed)
Progress Note   Patient: [name] Petersen DOB: [birth year] DOA: 11/17/2022     2 DOS: the patient was seen and examined on 11/20/2022   Brief hospital admission narrative: As per Dr. Randol Kern H&P written on 11/17/2022 [name] Petersen  is a 89 y.o. female, with medical history significant for, stroke, peripheral artery disease, diabetes mellitus, left BKA.  With recent hospitalization due to A-fib with RVR, she is discharged on 11/10/2022, she was discharged on Cardizem CD to 40 mg oral daily, and her metoprolol has been discontinued as she reported has been giving her fatigue. -Patient was sent to ED today for uncontrolled heart rate, she was noted to have heart rate in the 140s-150s in the facility, so she was sent to ED for further evaluation, any chest pain, but she does report some dyspnea, but she has no hypoxia, no leg edema, no oxygen requirement, she denies fever, cough or chills -In ED patient was noted to have heart rate in the 150s, she was started on Cardizem drip, workup significant for stable creatinine at 1.27, potassium of 3.8, chest x-ray still pending, BNP still pending, [facility] hospitalist consulted to admit  Assessment and Plan: 1-A-fib with RVR -Currently third hospitalization secondary to uncontrolled atrial fibrillation -Patient denies chest pain and expressed no feeling palpitations or heart racing at the moment. -ok to continue oral cardizem at adjusted dose; will continue to use of amiodarone drip. -Patient previously unable to tolerate the use of beta-blocker -If unable to control rate will require initiation of amiodarone. -Continue Eliquis for secondary prevention at the moment.  2-diastolic heart failure -For the most part compensated; as present at the moment of admission some dyspnea probably driven by RVR component on presentation -Continue oral Lasix, daily weights and strict I's and O's -Continue to low-sodium diet discussed with patient.  3-history of  CVA; with concerns for new stroke. -Continue Eliquis for secondary prevention -on 11/18/22 Acutely demonstrated transient episode of right upper extremity weakness; CT scan demonstrating no acute intracranial hemorrhage. Patient's daughter not interested at the moment for further stroke work up. -by 11/19/22 symptoms have further worsen and after discussing with neurology and further discussing with family concenst given for MRI, MRA, carotid dopplers and base on results decision regarding future preventive management (possible eliquis failure if dealing with embolic source). -After reviewing MRI/MRI results there is a combination of significant vascular disease and new left frontal cortex stroke.  Following neurology recommendations we will continue the use of Eliquis and add 81 mg baby aspirin on daily basis.  4-chronic kidney disease stage III -Stable and at baseline -Continue to follow renal function trend/stability -Continue avoiding hypotension and minimizing nephrotoxic agents.  5-hypertension -Continue current antihypertensive agent -Healthy diet discussed with patient. -Continue to follow vital signs.  6-right breast cancer -Stage Ib triple positive -Continue letrozole and outpatient follow-up with oncology service.  7-peripheral vascular disease/phantom limb pain: Patient is status post left BKA -Continue gabapentin.  8-hyperlipidemia -Continue statin  9-type 2 diabetes with neuropathy -Currently controlled with diet -Continue to follow CBGs fluctuation -Continue the use of Neurontin.  10-physical deconditioning -Patient has been seen by physical therapy with recommendations for SNF at discharge.   Subjective:  Continue to require amiodarone drip; demonstrating right-sided weakness and denying chest pain or shortness of breath.  Physical Exam: Vitals:   11/20/22 0549 11/20/22 0600 11/20/22 0730 11/20/22 1112  BP:  (!) 140/102    Pulse:  99    Resp:  20    Temp: (!)  97.5 F (36.4 C)  (!) 97.4 F (36.3 C) 98.1 F (36.7 C)  TempSrc: Axillary  Axillary Axillary  SpO2:  97%    Weight:      Height:       General exam: Oriented x 2; able to follow simple commands, oxygen supplementation 1-2 L through nasal cannula in place.  Continues to have right-sided deficits and lack of coordination. Respiratory system: Good air movement bilaterally; no wheezing or frank crackles.  No using accessory muscles.   Cardiovascular system: Irregular, no rubs, no gallops. Gastrointestinal system: Abdomen is obese, nondistended, soft and nontender. No organomegaly or masses felt. Normal bowel sounds heard. Central nervous system: Significant right-sided weakness, lack of coordination and my facial droop continued to be appreciated on examination.  Able to follow simple commands. Extremities: No cyanosis or clubbing.  Positive left BKA. Skin: No petechiae. Psychiatry: Mood and affect appropriate.  Latest data Reviewed: Comprehensive metabolic panel: Sodium 137, potassium 4.0, chloride 102, bicarb 26, BUN 14, creatinine 1.16, normal LFTs; GFR 44. LDL: Total cholesterol 118, triglycerides 70, HDL 40, LDL 64  Family Communication: Daughter At bedside.  Disposition: Status is: Inpatient Remains inpatient appropriate because: Continue treatment with IV Cardizem and follow recommendations by cardiology service to further manage A-fib with RVR.   Planned Discharge Destination: Assisted living facility  Time spent: 50 minutes  Author: Vassie Loll, MD 11/20/2022 3:57 PM  For on call review [email or web address].

## 2022-11-20 NOTE — TOC Progression Note (Signed)
Transition of Care [facility]) - Progression Note    Patient Details  Name: [name] Petersen MRN: [id number] Date of Birth: [birth year]  Transition of Care [facility]) CM/SW Contact  Catalina Gravel, [place] Phone Number: 11/20/2022, 3:29 PM  Clinical Narrative:    CSW contacted daughter [name], who states she does not know where mother's SS Card is but did bring a paper to the hospital with her number on it.  SS# is [id number]- one we were given by [facility]. TOC to follow.   Addendum Glennon Mac completed [id number].   Expected Discharge Plan: Skilled Nursing Facility Barriers to Discharge: Continued Medical Work up  Expected Discharge Plan and Services         Expected Discharge Date: 11/19/22                                     Social Determinants of Health (SDOH) Interventions SDOH Screenings   Food Insecurity: No Food Insecurity (11/17/2022)  Housing: Patient Declined (11/17/2022)  Transportation Needs: No Transportation Needs (11/17/2022)  Utilities: Not At Risk (11/17/2022)  Financial Resource Strain: Low Risk  (06/29/2022)   Received from [facility], [facility]  Physical Activity: Inactive (06/29/2022)   Received from [facility], [facility]  Social Connections: Moderately Integrated (06/29/2022)   Received from [facility], [facility]  Stress: No Stress Concern Present (06/29/2022)   Received from [facility], [facility]  Tobacco Use: Low Risk  (11/17/2022)  Health Literacy: Low Risk  (06/29/2022)   Received from [facility], [facility]    Readmission Risk Interventions    11/19/2022    3:12 PM 08/26/2020   11:01 AM  Readmission Risk Prevention Plan  Transportation Screening Complete Complete  PCP or Specialist Appt within 5-7 Days Not Complete   Home Care Screening Complete Complete  Medication Review (RN CM) Complete Complete

## 2022-11-20 NOTE — Progress Notes (Addendum)
CHART REVIEW FOLLOW UP NOTE MRI brain. MRA head and carotid dopplers reviewed. MR brain with scattered infarcts in the left frontal WM cortex and surrounding area with chronic small vessel disease. MRA head shows much motion but advanced b/l M1 flow gap and ICAD. No flow in the non dominant left vert until just before the basilar, likely retrograde flow. Carotid duplex with minimal calcified plaque in b/l extracranial carotids. Left vert not visualized. LDL - pending     IMPRESSION Stroke - most common culprit possible is underlying afib but also has severe ICAD which can be source of concomitant small vessel disease. She does not have significant athero in the neck to suspect extracranial carotid source but has bilateral M1 significant stenosis which also could make this somewhat watershed appearing.   Recs Ideally from stroke prevention standpoint in a patient with Afib, anticoagulation is sufficient, but with her ICAD and h/o PAD - I would recommend adding a baby ASA to her medical regimen, in addition to [facility] the caveat that this combination of antiplatelet and anticoagulant will have increased risk of bleed than one agent alone but with her strokes, and arterial disease, it is indicated. LDL pending. Goal <70. Follow with [facility] Stroke NP in 8-12 weeks. D/W Dr Gwenlyn Perking.

## 2022-11-20 NOTE — Plan of Care (Signed)
  Problem: Activity: Goal: Risk for activity intolerance will decrease Outcome: Progressing   Problem: Nutrition: Goal: Adequate nutrition will be maintained Outcome: Progressing   Problem: Pain Managment: Goal: General experience of comfort will improve Outcome: Progressing   Problem: Safety: Goal: Ability to remain free from injury will improve Outcome: Progressing   Problem: Skin Integrity: Goal: Risk for impaired skin integrity will decrease Outcome: Progressing   

## 2022-11-21 DIAGNOSIS — I4891 Unspecified atrial fibrillation: Secondary | ICD-10-CM | POA: Diagnosis not present

## 2022-11-21 DIAGNOSIS — N183 Chronic kidney disease, stage 3 unspecified: Secondary | ICD-10-CM | POA: Diagnosis not present

## 2022-11-21 DIAGNOSIS — I10 Essential (primary) hypertension: Secondary | ICD-10-CM | POA: Diagnosis not present

## 2022-11-21 DIAGNOSIS — E782 Mixed hyperlipidemia: Secondary | ICD-10-CM | POA: Diagnosis not present

## 2022-11-21 LAB — BASIC METABOLIC PANEL
Anion gap: 9 (ref 5–15)
BUN: 19 mg/dL (ref 8–23)
CO2: 28 mmol/L (ref 22–32)
Calcium: 8.8 mg/dL — ABNORMAL LOW (ref 8.9–10.3)
Chloride: 97 mmol/L — ABNORMAL LOW (ref 98–111)
Creatinine, Ser: 1.15 mg/dL — ABNORMAL HIGH (ref 0.44–1.00)
GFR, Estimated: 44 mL/min — ABNORMAL LOW (ref >60)
Glucose, Bld: 180 mg/dL — ABNORMAL HIGH (ref 70–99)
Potassium: 3.9 mmol/L (ref 3.5–5.1)
Sodium: 134 mmol/L — ABNORMAL LOW (ref 135–145)

## 2022-11-21 MED ORDER — HYDROMORPHONE HCL 1 MG/ML IJ SOLN
0.2500 mg | Freq: Three times a day (TID) | INTRAMUSCULAR | Status: DC | PRN
  Administered 2022-11-21: 0.25 mg via INTRAVENOUS
  Filled 2022-11-21: qty 0.5

## 2022-11-21 MED ORDER — ROPINIROLE HCL 0.25 MG PO TABS
0.2500 mg | ORAL_TABLET | Freq: Three times a day (TID) | ORAL | Status: DC
  Administered 2022-11-21 – 2022-11-23 (×7): 0.25 mg via ORAL
  Filled 2022-11-21 (×7): qty 1

## 2022-11-21 MED ORDER — AMIODARONE HCL 200 MG PO TABS
200.0000 mg | ORAL_TABLET | Freq: Two times a day (BID) | ORAL | Status: DC
  Administered 2022-11-21 – 2022-11-23 (×5): 200 mg via ORAL
  Filled 2022-11-21 (×5): qty 1

## 2022-11-21 MED ORDER — GABAPENTIN 100 MG PO CAPS: 100.0000 mg | ORAL_CAPSULE | Freq: Two times a day (BID) | ORAL | Status: DC

## 2022-11-21 MED ORDER — GABAPENTIN 100 MG PO CAPS
100.0000 mg | ORAL_CAPSULE | Freq: Two times a day (BID) | ORAL | Status: DC
  Administered 2022-11-21 – 2022-11-23 (×4): 100 mg via ORAL
  Filled 2022-11-21 (×4): qty 1

## 2022-11-21 MED ORDER — HYDROMORPHONE HCL 1 MG/ML IJ SOLN: 0.2500 mg | Freq: Once | INTRAMUSCULAR | Status: DC

## 2022-11-21 MED ORDER — AMIODARONE IV BOLUS ONLY 150 MG/100ML
150.0000 mg | Freq: Once | INTRAVENOUS | Status: AC
  Administered 2022-11-21: 150 mg via INTRAVENOUS
  Filled 2022-11-21: qty 100

## 2022-11-21 NOTE — TOC Progression Note (Signed)
Transition of Care [facility]) - Progression Note    Patient Details  Name: [name] Petersen MRN: [id number] Date of Birth: 02/20/1927  Transition of Care [facility]) CM/SW Contact  Leitha Bleak, RN Phone Number: 11/21/2022, 4:28 PM  Clinical Narrative:   Clovis Cao completed, SS # [id number]- Patient already had a PASSR # [id number]. Sent FL2 and PT notes to [facility] [place] [facility] [place]. TOC will contact registration Monday to correct SS#.  INS AUTH started.     Expected Discharge Plan: Skilled Nursing Facility Barriers to Discharge: Continued Medical Work up  Expected Discharge Plan and Services        Expected Discharge Date: 11/19/22                    Social Determinants of Health (SDOH) Interventions SDOH Screenings   Food Insecurity: No Food Insecurity (11/17/2022)  Housing: Patient Declined (11/17/2022)  Transportation Needs: No Transportation Needs (11/17/2022)  Utilities: Not At Risk (11/17/2022)  Financial Resource Strain: Low Risk  (06/29/2022)   Received from [facility], [facility]  Physical Activity: Inactive (06/29/2022)   Received from [facility], [facility]  Social Connections: Moderately Integrated (06/29/2022)   Received from [facility], [facility]  Stress: No Stress Concern Present (06/29/2022)   Received from [facility], [facility]  Tobacco Use: Low Risk  (11/17/2022)  Health Literacy: Low Risk  (06/29/2022)   Received from [facility], [facility]    Readmission Risk Interventions    11/19/2022    3:12 PM 08/26/2020   11:01 AM  Readmission Risk Prevention Plan  Transportation Screening Complete Complete  PCP or Specialist Appt within 5-7 Days Not Complete   Home Care Screening Complete Complete  Medication Review (RN CM) Complete Complete

## 2022-11-21 NOTE — [place] FL2 (Signed)
MEDICAID FL2 LEVEL OF CARE FORM     IDENTIFICATION  Patient Name: [name] Petersen Birthdate: 10/11/1926 Sex: female Admission Date (Current Location): 11/17/2022  [facility] and [place] Number:  [employer] and Address:  [facility],  [address], [place] [address]      Provider Number: [id number]  Attending Physician Name and Address:  Vassie Loll, MD  Relative Name and Phone Number:  Vivien Presto (Daughter)  [phone] (    Current Level of Care: Hospital Recommended Level of Care: Assisted Living Facility, Skilled Nursing Facility Prior Approval Number:    Date Approved/Denied:   PASRR Number: [id number]  Discharge Plan: SNF    Current Diagnoses: Patient Active Problem List   Diagnosis Date Noted   Atrial flutter with rapid ventricular response (HCC) 11/10/2022   Acute diastolic (congestive) heart failure (HCC) 11/09/2022   Atrial fibrillation with RVR (HCC) 09/11/2022   History of stroke 08/21/2020   Hyperlipidemia 08/21/2020   Urinary retention    Phantom limb pain (HCC)    Unilateral complete BKA, left, sequela (HCC)    Slow transit constipation    Atherosclerotic PVD with ulceration (HCC) 07/14/2017   Pressure injury of right heel, stage 3 (HCC)    Sleep disturbance    Poor nutrition    Hypokalemia    Drug-induced constipation    Essential hypertension    Stage 3 chronic kidney disease (HCC)    Postoperative pain    Type 2 diabetes mellitus with obesity (HCC)    S/P below knee amputation, left (HCC) 07/10/2017   PAD (peripheral artery disease) (HCC) 07/06/2017   Atherosclerosis of native arteries of the extremities with ulceration (HCC) 06/16/2017    Orientation RESPIRATION BLADDER Height & Weight     Self, Time, Situation, Place  Normal Continent Weight: 72.6 kg Height:  5\' 2"  (157.5 cm)  BEHAVIORAL SYMPTOMS/MOOD NEUROLOGICAL BOWEL NUTRITION STATUS      Continent Diet (Low sodium heart healthy)  AMBULATORY  STATUS COMMUNICATION OF NEEDS Skin   Extensive Assist Verbally Normal                       Personal Care Assistance Level of Assistance  Bathing, Feeding, Dressing Bathing Assistance: Limited assistance Feeding assistance: Limited assistance Dressing Assistance: Limited assistance     Functional Limitations Info  Sight, Hearing, Speech Sight Info: Impaired Hearing Info: Impaired Speech Info: Adequate    SPECIAL CARE FACTORS FREQUENCY  PT (By licensed PT)     PT Frequency: 5 times a week              Contractures Contractures Info: Not present    Additional Factors Info  Code Status, Allergies Code Status Info: DNR Allergies Info: Prednisone, Scopace(scopolamine)           Current Medications (11/21/2022):  This is the current hospital active medication list Current Facility-Administered Medications  Medication Dose Route Frequency Provider Last Rate Last Admin   acetaminophen (TYLENOL) tablet 650 mg  650 mg Oral Q4H PRN Elgergawy, Leana Roe, MD   650 mg at 11/21/22 0901   albuterol (PROVENTIL) (2.5 MG/3ML) 0.083% nebulizer solution 2.5 mg  2.5 mg Nebulization Once Zierle-Ghosh, Asia B, DO       amiodarone (PACERONE) tablet 200 mg  200 mg Oral BID Vassie Loll, MD   200 mg at 11/21/22 0957   apixaban (ELIQUIS) tablet 5 mg  5 mg Oral BID Elgergawy, Leana Roe, MD   5 mg at  11/21/22 0849   aspirin EC tablet 81 mg  81 mg Oral Daily Vassie Loll, MD   81 mg at 11/21/22 0850   bisacodyl (DULCOLAX) suppository 10 mg  10 mg Rectal Daily PRN Vassie Loll, MD   10 mg at 11/20/22 1342   Chlorhexidine Gluconate Cloth 2 % PADS 6 each  6 each Topical Q0600 Elgergawy, Leana Roe, MD   6 each at 11/21/22 0531   diltiazem (CARDIZEM CD) 24 hr capsule 300 mg  300 mg Oral Daily Vassie Loll, MD   300 mg at 11/21/22 9604   furosemide (LASIX) tablet 20 mg  20 mg Oral Daily Elgergawy, Leana Roe, MD   20 mg at 11/21/22 0851   gabapentin (NEURONTIN) capsule 100 mg  100 mg Oral BID  Vassie Loll, MD       guaiFENesin-dextromethorphan (ROBITUSSIN DM) 100-10 MG/5ML syrup 5 mL  5 mL Oral Q4H PRN Elgergawy, Leana Roe, MD   5 mL at 11/18/22 0546   hydrALAZINE (APRESOLINE) injection 5 mg  5 mg Intravenous Q6H PRN Elgergawy, Leana Roe, MD   5 mg at 11/20/22 2044   HYDROmorphone (DILAUDID) injection 0.25 mg  0.25 mg Intravenous Q8H PRN Vassie Loll, MD       letrozole [facility]) tablet 2.5 mg  2.5 mg Oral Daily Elgergawy, Leana Roe, MD   2.5 mg at 11/20/22 0926   levalbuterol (XOPENEX) nebulizer solution 0.63 mg  0.63 mg Nebulization Q6H PRN Zierle-Ghosh, Asia B, DO   0.63 mg at 11/20/22 0308   ondansetron (ZOFRAN) injection 4 mg  4 mg Intravenous Q6H PRN Elgergawy, Leana Roe, MD       pantoprazole (PROTONIX) EC tablet 40 mg  40 mg Oral Daily Vassie Loll, MD   40 mg at 11/21/22 0848   polyethylene glycol (MIRALAX / GLYCOLAX) packet 17 g  17 g Oral BID Vassie Loll, MD   17 g at 11/21/22 0957   pravastatin (PRAVACHOL) tablet 20 mg  20 mg Oral q1800 Elgergawy, Leana Roe, MD   20 mg at 11/20/22 2044   rOPINIRole (REQUIP) tablet 0.25 mg  0.25 mg Oral TID Vassie Loll, MD       senna-docusate (Senokot-S) tablet 1 tablet  1 tablet Oral QHS Vassie Loll, MD   1 tablet at 11/20/22 2043     Discharge Medications: Please see discharge summary for a list of discharge medications.  Relevant Imaging Results:  Relevant Lab Results:   Additional Information SS# [id number]  Leitha Bleak, RN

## 2022-11-21 NOTE — Progress Notes (Signed)
Progress Note   Patient: [name] Petersen MWN:[id number] DOB: 1926-08-11 DOA: 11/17/2022     3 DOS: the patient was seen and examined on 11/21/2022   Brief hospital admission narrative: As per Dr. Randol Kern H&P written on 11/17/2022 [name] Petersen  is a 89 y.o. female, with medical history significant for, stroke, peripheral artery disease, diabetes mellitus, left BKA.  With recent hospitalization due to A-fib with RVR, she is discharged on 11/10/2022, she was discharged on Cardizem CD to 40 mg oral daily, and her metoprolol has been discontinued as she reported has been giving her fatigue. -Patient was sent to ED today for uncontrolled heart rate, she was noted to have heart rate in the 140s-150s in the facility, so she was sent to ED for further evaluation, any chest pain, but she does report some dyspnea, but she has no hypoxia, no leg edema, no oxygen requirement, she denies fever, cough or chills -In ED patient was noted to have heart rate in the 150s, she was started on Cardizem drip, workup significant for stable creatinine at 1.27, potassium of 3.8, chest x-ray still pending, BNP still pending, [facility] hospitalist consulted to admit  Assessment and Plan: 1-A-fib with RVR -Currently third hospitalization secondary to uncontrolled atrial fibrillation -Patient denies chest pain and expressed no feeling palpitations or heart racing at the moment. -Attempted to transition patient off amiodarone drip with plan to use 200 mg twice a day; continue daily Cardizem extended release 360 mg. -Close monitoring of patient electrolytes with goal for potassium above 4 and magnesium above 2. -Patient previously unable to tolerate the use of beta-blocker -If unable to control rate will require initiation of amiodarone. -Continue Eliquis for secondary prevention at the moment.  2-diastolic heart failure -For the most part compensated; as present at the moment of admission some dyspnea probably driven by RVR  component on presentation -Continue oral Lasix, daily weights and strict I's and O's -Continue to low-sodium diet discussed with patient.  3-history of CVA; with concerns for new stroke. -Continue Eliquis for secondary prevention -on 11/18/22 Acutely demonstrated transient episode of right upper extremity weakness; CT scan demonstrating no acute intracranial hemorrhage. Patient's daughter not interested at the moment for further stroke work up. -by 11/19/22 symptoms have further worsen and after discussing with neurology and further discussing with family concenst given for MRI, MRA, carotid dopplers and base on results decision regarding future preventive management (possible eliquis failure if dealing with embolic source). -After reviewing MRI/MRI results there is a combination of significant vascular disease and new left frontal cortex stroke.  Following neurology recommendations we will continue the use of Eliquis and add 81 mg baby aspirin on daily basis. -So far tolerating medication well; no signs of overt bleeding. -Continue GI prophylaxis with the use of daily Protonix.  4-chronic kidney disease stage III -Stable and at baseline -Will continue to follow renal function trend/stability -Continue avoiding hypotension and minimizing nephrotoxic agents.  5-hypertension -Continue current antihypertensive agent -Healthy diet discussed with patient. -Continue to follow vital signs.  6-right breast cancer -Stage Ib triple positive -Continue letrozole and outpatient follow-up with oncology service.  7-peripheral vascular disease/phantom limb pain: Patient is status post left BKA -Continue gabapentin at adjusted dose..  8-hyperlipidemia -Continue statin  9-type 2 diabetes with neuropathy -Currently controlled with diet -Continue to follow CBGs fluctuation -Continue the use of Neurontin, dose adjusted to twice a day.  10-physical deconditioning -Patient has been seen by physical  therapy with recommendations for SNF at discharge.  11-constipation -  Continue treatment with Senokot, MiraLAX and as needed Dulcolax suppository. -Maintain adequate hydration.   Subjective:  Continue to require amiodarone drip; demonstrating right-sided weakness and denying chest pain or shortness of breath.  Physical Exam: Vitals:   11/21/22 0830 11/21/22 0900 11/21/22 0930 11/21/22 1000  BP:    (!) 164/112  Pulse: (!) 151 (!) 126 (!) 108 (!) 117  Resp: 15 (!) 21 (!) 26 19  Temp:      TempSrc:      SpO2: 98% 95% 97% 95%  Weight:      Height:       General exam: Afebrile, mild distress secondary to restless leg syndrome, lack of adequate mobility and coordination on her right side.  No chest pain, no shortness of breath.  Good saturation on room air Respiratory system: Clear to auscultation. Respiratory effort normal.  Good air movement bilaterally. Cardiovascular system: Rate controlled, no rubs, no gallops, no JVD on exam. Gastrointestinal system: Abdomen is obese, nondistended, soft and nontender.  Positive bowel sounds. Central nervous system: No new focal deficits appreciated. Extremities: No cyanosis or clubbing; no edema.  Left BKA. Skin: No petechiae. Psychiatry: Flat affect appreciated on exam.  Latest data Reviewed: Comprehensive metabolic panel: Sodium 137, potassium 4.0, chloride 102, bicarb 26, BUN 14, creatinine 1.16, normal LFTs; GFR 44. LDL: Total cholesterol 118, triglycerides 70, HDL 40, LDL 64  Family Communication: Daughter and niece updated at bedside.  Disposition: Status is: Inpatient Remains inpatient appropriate because: Continue treatment with IV Cardizem and follow recommendations by cardiology service to further manage A-fib with RVR.   Planned Discharge Destination: Assisted living facility  Time spent: 50 minutes  Author: Vassie Loll, MD 11/21/2022 10:35 AM  For on call review [email or web address].

## 2022-11-22 DIAGNOSIS — Z8673 Personal history of transient ischemic attack (TIA), and cerebral infarction without residual deficits: Secondary | ICD-10-CM

## 2022-11-22 DIAGNOSIS — Z7901 Long term (current) use of anticoagulants: Secondary | ICD-10-CM

## 2022-11-22 DIAGNOSIS — E782 Mixed hyperlipidemia: Secondary | ICD-10-CM | POA: Diagnosis not present

## 2022-11-22 DIAGNOSIS — I4891 Unspecified atrial fibrillation: Secondary | ICD-10-CM | POA: Diagnosis not present

## 2022-11-22 DIAGNOSIS — I10 Essential (primary) hypertension: Secondary | ICD-10-CM | POA: Diagnosis not present

## 2022-11-22 DIAGNOSIS — N183 Chronic kidney disease, stage 3 unspecified: Secondary | ICD-10-CM | POA: Diagnosis not present

## 2022-11-22 MED ORDER — DILTIAZEM HCL ER COATED BEADS 180 MG PO CP24
360.0000 mg | ORAL_CAPSULE | Freq: Every day | ORAL | Status: DC
  Administered 2022-11-22 – 2022-11-23 (×2): 360 mg via ORAL
  Filled 2022-11-22 (×2): qty 2

## 2022-11-22 NOTE — Progress Notes (Signed)
I have received a request by family member and patient to stop the use of letrozole; they have contacted [facility] oncology service to receive formal and final authorization on it without answer or response.  They understand any potential associated risk and would like to still hold on the medication for now.  Plan: -Letrozole discontinued.  Vassie Loll MD [phone]

## 2022-11-22 NOTE — Progress Notes (Signed)
Progress Note  Patient Name: [name] Petersen Date of Encounter: 11/22/2022  Primary Cardiologist: None  Subjective   No acute events overnight, no symptoms.  Has some mild SOB for a long time but resting well.  No palpitations or fatigue.  Inpatient Medications    Scheduled Meds:  albuterol  2.5 mg Nebulization Once   amiodarone  200 mg Oral BID   apixaban  5 mg Oral BID   aspirin EC  81 mg Oral Daily   Chlorhexidine Gluconate Cloth  6 each Topical Q0600   diltiazem  300 mg Oral Daily   furosemide  20 mg Oral Daily   gabapentin  100 mg Oral BID   letrozole  2.5 mg Oral Daily   pantoprazole  40 mg Oral Daily   polyethylene glycol  17 g Oral BID   pravastatin  20 mg Oral q1800   rOPINIRole  0.25 mg Oral TID   senna-docusate  1 tablet Oral QHS   Continuous Infusions:  PRN Meds: acetaminophen, bisacodyl, guaiFENesin-dextromethorphan, hydrALAZINE, HYDROmorphone (DILAUDID) injection, levalbuterol, ondansetron (ZOFRAN) IV   Vital Signs    Vitals:   11/22/22 0531 11/22/22 0600 11/22/22 0711 11/22/22 0830  BP:  138/64  (!) 156/108  Pulse:  (!) 126 91 (!) 114  Resp:  (!) 21 20 18   Temp: (!) 97.5 F (36.4 C)  (!) 97.5 F (36.4 C)   TempSrc: Oral  Axillary   SpO2:  90% 91% 95%  Weight: 72.9 kg     Height:        Intake/Output Summary (Last 24 hours) at 11/22/2022 0942 Last data filed at 11/22/2022 0830 Gross per 24 hour  Intake 780 ml  Output --  Net 780 ml   Filed Weights   11/19/22 0500 11/21/22 0435 11/22/22 0531  Weight: 73.5 kg 72.6 kg 72.9 kg    Telemetry   In atrial fibrillation, heart rate ranging between 1900s.  ECG    Not performed today  Physical Exam   GEN: No acute distress.   Neck: No JVD. Cardiac: irregular rate and rhythm, no murmur, rub, or gallop.  Respiratory: Nonlabored. Clear to auscultation bilaterally. GI: Soft, nontender, bowel sounds present. MS: No edema; L BKA, RLE no edema Neuro:  Nonfocal. Psych: Alert and oriented x 3.  Normal affect.  Labs    Chemistry Recent Labs  Lab 11/17/22 1359 11/18/22 0505 11/19/22 0002 11/21/22 1056  NA 136 137 135 134*  K 3.8 4.0 4.1 3.9  CL 101 102 100 97*  CO2 26 26 26 28   GLUCOSE 183* 148* 162* 180*  BUN 15 14 14 19   CREATININE 1.27* 1.16* 1.19* 1.15*  CALCIUM 8.7* 8.8* 9.1 8.8*  PROT 5.9* 5.8*  --   --   ALBUMIN 3.2* 3.2*  --   --   AST 23 20  --   --   ALT 18 17  --   --   ALKPHOS 75 74  --   --   BILITOT 0.7 0.5  --   --   GFRNONAA 39* 44* 43* 44*  ANIONGAP 9 9 9 9      Hematology Recent Labs  Lab 11/17/22 1359 11/22/22 0519  WBC 6.2 6.1  RBC 4.25 4.24  HGB 12.2 12.1  HCT 39.0 38.3  MCV 91.8 90.3  MCH 28.7 28.5  MCHC 31.3 31.6  RDW 14.6 14.6  PLT 270 302    Cardiac Enzymes Recent Labs  Lab 11/11/22 0630  TROPONINIHS 10    BNP  Recent Labs  Lab 11/17/22 1359  BNP 244.0*     DDimerNo results for input(s): "DDIMER" in the last 168 hours.   Radiology    No results found.  Assessment & Plan    # Persistent atrial fibrillation with RVR # History of CVA, presented with strokelike symptoms -New diagnosis after starting chemotherapy for breast cancer.  No symptoms of palpitations, fatigue but has mild SOB for a long time.  Echo from 5/24 showed normal LVEF, G2 DD, normal RV function, mild MR and mild AS.  Currently on amiodarone 200 mg twice daily which I will continue for 3 weeks and switch to amiodarone 200 mg once daily thereafter.  Currently on diltiazem 300 mg once daily (dose was increased from 240 mg, home medication), will increase to 360 mg once daily. Goal resting HR < 90 bpm.  Currently DNR, I spoke with the patient's daughter at the bedside who prefers medical management. Does not want aggressive procedures like TEE or DCCV. -Continue Eliquis 5 mg twice daily discontinue aspirin while on Eliquis. Patient's daughter not interested to pursue further stroke workup at this time.   [facility] will sign off.   Medication  Recommendations: Diltiazem 360 mg once daily, amiodarone 200 mg twice daily for 3 weeks followed by amiodarone 200 mg once daily, p.o. Lasix 20 mg once daily. Other recommendations (labs, testing, etc): None Follow up as an outpatient: Cardiology follow-up in 1 month   Signed,  P , MD  11/22/2022, 9:42 AM

## 2022-11-22 NOTE — Progress Notes (Signed)
Physical Therapy Treatment Patient Details Name: [name] Petersen MRN: [id number] DOB: 1926-10-04 Today's Date: 11/22/2022   History of Present Illness Derick Avitia  is a 89 y.o. female, with medical history significant for, stroke, peripheral artery disease, diabetes mellitus, left BKA.  With recent hospitalization due to A-fib with RVR, she is discharged on 11/10/2022, she was discharged on Cardizem CD to 40 mg oral daily, and her metoprolol has been discontinued as she reported has been giving her fatigue.  -Patient was sent to ED today for uncontrolled heart rate, she was noted to have heart rate in the 140s-150s in the facility, so she was sent to ED for further evaluation, any chest pain, but she does report some dyspnea, but she has no hypoxia, no leg edema, no oxygen requirement, she denies fever, cough or chills    PT Comments  Patient demonstrates some trance movement in right and and foot, unable to lift against gravity due to weakness, focused on trunk control while seated at bedside, able to keep trunk in midline for up to 20-30 seconds before leaning forward, frequently had to use left hand to support self and tolerated sitting up at bedside for approximately 15-20 minutes before having to lay down due to fatigue.  Patient required Max assist to reposition when put back to bed.  Patient will benefit from continued skilled physical therapy in hospital and recommended venue below to increase strength, balance, endurance for safe ADLs and gait.     If plan is discharge home, recommend the following: A lot of help with bathing/dressing/bathroom;A lot of help with walking and/or transfers;Assistance with cooking/housework;Help with stairs or ramp for entrance   Can travel by private vehicle     No  Equipment Recommendations  None recommended by PT    Recommendations for Other Services       Precautions / Restrictions Precautions Precautions: Fall Restrictions Weight Bearing  Restrictions: No     Mobility  Bed Mobility Overal bed mobility: Needs Assistance Bed Mobility: Supine to Sit, Sit to Supine, Rolling Rolling: Mod assist   Supine to sit: Max assist Sit to supine: Mod assist, Max assist   General bed mobility comments: Mod assist rolling to the right, slow labored movement with limited use of right side    Transfers                        Ambulation/Gait                   Stairs             Wheelchair Mobility     Tilt Bed    Modified Rankin (Stroke Patients Only)       Balance Overall balance assessment: Needs assistance Sitting-balance support: Feet supported, Single extremity supported Sitting balance-Leahy Scale: Poor Sitting balance - Comments: frequent leaning/falling forward due to poor trunk control, right sided weakness Postural control: Right lateral lean                                  Cognition Arousal/Alertness: Awake/alert Behavior During Therapy: WFL for tasks assessed/performed Overall Cognitive Status: Within Functional Limits for tasks assessed  Exercises General Exercises - Lower Extremity Heel Slides: AAROM, Strengthening, Right, 10 reps Hip ABduction/ADduction: AAROM, Strengthening, Right, 10 reps    General Comments        Pertinent Vitals/Pain Pain Assessment Pain Assessment: No/denies pain    Home Living                          Prior Function            PT Goals (current goals can now be found in the care plan section) Acute Rehab PT Goals Patient Stated Goal: return home (ALF) PT Goal Formulation: With patient/family Time For Goal Achievement: 12/03/22 Potential to Achieve Goals: Fair Progress towards PT goals: Progressing toward goals    Frequency    Min 3X/week      PT Plan Current plan remains appropriate    Co-evaluation              AM-PAC PT "6 Clicks"  Mobility   Outcome Measure  Help needed turning from your back to your side while in a flat bed without using bedrails?: A Lot Help needed moving from lying on your back to sitting on the side of a flat bed without using bedrails?: A Lot Help needed moving to and from a bed to a chair (including a wheelchair)?: Total Help needed standing up from a chair using your arms (e.g., wheelchair or bedside chair)?: Total Help needed to walk in hospital room?: Total Help needed climbing 3-5 steps with a railing? : Total 6 Click Score: 8    End of Session   Activity Tolerance: Patient tolerated treatment well;Patient limited by fatigue Patient left: in bed;with call bell/phone within reach;with family/visitor present Nurse Communication: Mobility status PT Visit Diagnosis: Unsteadiness on feet (R26.81);Other abnormalities of gait and mobility (R26.89);Muscle weakness (generalized) (M62.81)     Time: 4403-4742 PT Time Calculation (min) (ACUTE ONLY): 26 min  Charges:    $Therapeutic Exercise: 8-22 mins $Therapeutic Activity: 8-22 mins PT General Charges $$ ACUTE PT VISIT: 1 Visit                     3:42 PM, 11/22/22 Ocie Bob, MPT Physical Therapist with [facility] 336 [phone] office [phone] mobile phone

## 2022-11-22 NOTE — TOC Progression Note (Signed)
Transition of Care [facility]) - Progression Note    Patient Details  Name: [name] Petersen MRN: [id number] Date of Birth: February 08, 1927  [facility] CM/SW Contact  Leitha Bleak, RN Phone Number: 11/22/2022, 10:39 AM  Clinical Narrative:   Discussed bed offers with her daughter,[name]. [facility] Sem[facility] accepted, CM confirmed bed offer, CMA adding to [facility] AUTH    Expected Discharge Plan: Skilled Nursing Facility Barriers to Discharge: Continued Medical Work up  Expected Discharge Plan and Services      Expected Discharge Date: 11/19/22                  Social Determinants of Health (SDOH) Interventions SDOH Screenings   Food Insecurity: No Food Insecurity (11/17/2022)  Housing: Patient Declined (11/17/2022)  Transportation Needs: No Transportation Needs (11/17/2022)  Utilities: Not At Risk (11/17/2022)  Financial Resource Strain: Low Risk  (06/29/2022)   Received from [facility], [facility]  Physical Activity: Inactive (06/29/2022)   Received from [facility], [facility]  Social Connections: Moderately Integrated (06/29/2022)   Received from [facility], [facility]  Stress: No Stress Concern Present (06/29/2022)   Received from [facility], [facility]  Tobacco Use: Low Risk  (11/17/2022)  Health Literacy: Low Risk  (06/29/2022)   Received from [facility], [facility]    Readmission Risk Interventions    11/19/2022    3:12 PM 08/26/2020   11:01 AM  Readmission Risk Prevention Plan  Transportation Screening Complete Complete  PCP or Specialist Appt within 5-7 Days Not Complete   Home Care Screening Complete Complete  Medication Review (RN CM) Complete Complete

## 2022-11-22 NOTE — Progress Notes (Signed)
Progress Note   Patient: GABI REESER ZOX:[id number] DOB: 12-23-1926 DOA: 11/17/2022     4 DOS: the patient was seen and examined on 11/22/2022   Brief hospital admission narrative: As per Dr. Randol Kern H&P written on 11/17/2022 Jeliah Apkarian  is a 89 y.o. female, with medical history significant for, stroke, peripheral artery disease, diabetes mellitus, left BKA.  With recent hospitalization due to A-fib with RVR, she is discharged on 11/10/2022, she was discharged on Cardizem CD to 40 mg oral daily, and her metoprolol has been discontinued as she reported has been giving her fatigue. -Patient was sent to ED today for uncontrolled heart rate, she was noted to have heart rate in the 140s-150s in the facility, so she was sent to ED for further evaluation, any chest pain, but she does report some dyspnea, but she has no hypoxia, no leg edema, no oxygen requirement, she denies fever, cough or chills -In ED patient was noted to have heart rate in the 150s, she was started on Cardizem drip, workup significant for stable creatinine at 1.27, potassium of 3.8, chest x-ray still pending, BNP still pending, [facility] hospitalist consulted to admit  Assessment and Plan: 1-A-fib with RVR -Currently third hospitalization secondary to uncontrolled atrial fibrillation -Patient denies chest pain and expressed no feeling palpitations or heart racing at the moment. -off amiodarone drip and using orally 200 mg twice a day for three weeks; continue daily Cardizem extended release 360 mg now. -Close monitoring of patient electrolytes with goal for potassium above 4 and magnesium above 2. -Patient previously unable to tolerate the use of beta-blocker -If unable to control rate will require initiation of amiodarone. -Continue Eliquis for secondary prevention at the moment.  2-diastolic heart failure -For the most part compensated; as present at the moment of admission some dyspnea probably driven by RVR component on  presentation -Continue oral Lasix, daily weights and strict I's and O's -Continue to low-sodium diet discussed with patient.  3-history of CVA; with concerns for new stroke. -Continue Eliquis for secondary prevention -on 11/18/22 Acutely demonstrated transient episode of right upper extremity weakness; CT scan demonstrating no acute intracranial hemorrhage. Patient's daughter not interested at the moment for further stroke work up. -by 11/19/22 symptoms have further worsen and after discussing with neurology and further discussing with family concenst given for MRI, MRA, carotid dopplers and base on results decision regarding future preventive management (possible eliquis failure if dealing with embolic source). -After reviewing MRI/MRI results there is a combination of significant vascular disease and new left frontal cortex stroke.  Following neurology recommendations we will continue the use of Eliquis and add 81 mg baby aspirin on daily basis. -So far tolerating medication well; no signs of overt bleeding. -Continue GI prophylaxis with the use of daily Protonix.  4-chronic kidney disease stage III -Stable and at baseline -Will continue to follow renal function trend/stability -Continue avoiding hypotension and minimizing nephrotoxic agents.  5-hypertension -Continue current antihypertensive agent -Healthy diet discussed with patient. -Continue to follow vital signs.  6-right breast cancer -Stage Ib triple positive -Continue letrozole and outpatient follow-up with oncology service.  7-peripheral vascular disease/phantom limb pain: Patient is status post left BKA -Continue gabapentin at adjusted dose..  8-hyperlipidemia -Continue statin  9-type 2 diabetes with neuropathy -Currently controlled with diet -Continue to follow CBGs fluctuation -Continue the use of Neurontin, dose adjusted to twice a day.  10-physical deconditioning -Patient has been seen by physical therapy with  recommendations for SNF at discharge.  11-constipation -Continue  treatment with Senokot, MiraLAX and as needed Dulcolax suppository. -Maintain adequate hydration.   Subjective:  Afebrile, no CP, no nausea, no vomiting, no CP. Good sat on room air. Off amiodarone drip and well controlled for the most part.  Physical Exam: Vitals:   11/22/22 0600 11/22/22 0711 11/22/22 0830 11/22/22 1142  BP: 138/64  (!) 156/108   Pulse: (!) 126 91 (!) 114 (!) 148  Resp: (!) 21 20 18  (!) 22  Temp:  (!) 97.5 F (36.4 C)  (!) 97.5 F (36.4 C)  TempSrc:  Axillary  Axillary  SpO2: 90% 91% 95% 94%  Weight:      Height:       General exam: no new focal neurologic dficit, experiencing less discomfort with restless leg syndrome. Good sat on RA. Respiratory system: Clear to auscultation. Respiratory effort normal. No using accessory muscles. Cardiovascular system: irregular, rate much better controlled. No CP, no rubs or gallops.  Gastrointestinal system: Abdomen is obese, nondistended, soft and nontender. No organomegaly or masses felt. Normal bowel sounds heard. Central nervous system:No new focal neurological deficits. Extremities: No cyanosis, no clubbing, left BKA. Skin: No petechiae. Psychiatry: stable flat affect appreciated.  Latest data Reviewed: Comprehensive metabolic panel: Sodium 137, potassium 4.0, chloride 102, bicarb 26, BUN 14, creatinine 1.16, normal LFTs; GFR 44. LDL: Total cholesterol 118, triglycerides 70, HDL 40, LDL 64 CBC:WBC's 6.1, Hgb 03.4 and platelets 302K  Family Communication: Daughter and niece updated at bedside.  Disposition: Status is: Inpatient Remains inpatient appropriate because: Continue treatment with IV Cardizem and follow recommendations by cardiology service to further manage A-fib with RVR.   Planned Discharge Destination: Assisted living facility  Time spent: 50 minutes  Author: Vassie Loll, MD 11/22/2022 12:21 PM  For on call review  [email or web address].

## 2022-11-23 DIAGNOSIS — R2689 Other abnormalities of gait and mobility: Secondary | ICD-10-CM | POA: Diagnosis not present

## 2022-11-23 DIAGNOSIS — I639 Cerebral infarction, unspecified: Secondary | ICD-10-CM | POA: Diagnosis not present

## 2022-11-23 DIAGNOSIS — R5381 Other malaise: Secondary | ICD-10-CM | POA: Diagnosis not present

## 2022-11-23 DIAGNOSIS — Z7401 Bed confinement status: Secondary | ICD-10-CM | POA: Diagnosis not present

## 2022-11-23 DIAGNOSIS — I130 Hypertensive heart and chronic kidney disease with heart failure and stage 1 through stage 4 chronic kidney disease, or unspecified chronic kidney disease: Secondary | ICD-10-CM | POA: Diagnosis not present

## 2022-11-23 DIAGNOSIS — I4891 Unspecified atrial fibrillation: Secondary | ICD-10-CM | POA: Diagnosis not present

## 2022-11-23 DIAGNOSIS — N1832 Chronic kidney disease, stage 3b: Secondary | ICD-10-CM | POA: Diagnosis not present

## 2022-11-23 DIAGNOSIS — R278 Other lack of coordination: Secondary | ICD-10-CM | POA: Diagnosis not present

## 2022-11-23 DIAGNOSIS — I5033 Acute on chronic diastolic (congestive) heart failure: Secondary | ICD-10-CM | POA: Diagnosis not present

## 2022-11-23 DIAGNOSIS — Z515 Encounter for palliative care: Secondary | ICD-10-CM | POA: Diagnosis not present

## 2022-11-23 DIAGNOSIS — E782 Mixed hyperlipidemia: Secondary | ICD-10-CM | POA: Diagnosis not present

## 2022-11-23 DIAGNOSIS — I739 Peripheral vascular disease, unspecified: Secondary | ICD-10-CM | POA: Diagnosis not present

## 2022-11-23 DIAGNOSIS — R69 Illness, unspecified: Secondary | ICD-10-CM | POA: Diagnosis not present

## 2022-11-23 DIAGNOSIS — R799 Abnormal finding of blood chemistry, unspecified: Secondary | ICD-10-CM | POA: Diagnosis not present

## 2022-11-23 DIAGNOSIS — I4892 Unspecified atrial flutter: Secondary | ICD-10-CM | POA: Diagnosis not present

## 2022-11-23 DIAGNOSIS — E1122 Type 2 diabetes mellitus with diabetic chronic kidney disease: Secondary | ICD-10-CM | POA: Diagnosis not present

## 2022-11-23 DIAGNOSIS — I10 Essential (primary) hypertension: Secondary | ICD-10-CM | POA: Diagnosis not present

## 2022-11-23 DIAGNOSIS — I7389 Other specified peripheral vascular diseases: Secondary | ICD-10-CM | POA: Diagnosis not present

## 2022-11-23 DIAGNOSIS — M6281 Muscle weakness (generalized): Secondary | ICD-10-CM | POA: Diagnosis not present

## 2022-11-23 DIAGNOSIS — N183 Chronic kidney disease, stage 3 unspecified: Secondary | ICD-10-CM | POA: Diagnosis not present

## 2022-11-23 MED ORDER — GABAPENTIN 100 MG PO CAPS: 100.0000 mg | ORAL_CAPSULE | Freq: Two times a day (BID) | ORAL | Status: AC

## 2022-11-23 MED ORDER — AMIODARONE HCL 200 MG PO TABS: ORAL_TABLET | ORAL | Status: DC

## 2022-11-23 MED ORDER — DILTIAZEM HCL ER COATED BEADS 360 MG PO CP24: 360.0000 mg | ORAL_CAPSULE | Freq: Every day | ORAL | Status: AC

## 2022-11-23 MED ORDER — ROPINIROLE HCL 0.25 MG PO TABS: 0.2500 mg | ORAL_TABLET | Freq: Three times a day (TID) | ORAL | Status: DC

## 2022-11-23 MED ORDER — PANTOPRAZOLE SODIUM 40 MG PO TBEC: 40.0000 mg | DELAYED_RELEASE_TABLET | Freq: Every day | ORAL | Status: DC

## 2022-11-23 NOTE — TOC Transition Note (Signed)
Transition of Care [facility]) - CM/SW Discharge Note   Patient Details  Name: [name] Petersen MRN: [id number] Date of Birth: 07-08-1926  [facility] CM/SW Contact:  Leitha Bleak, RN Phone Number: 11/23/2022, 1:54 PM   Clinical Narrative:   Patient discharging to [facility] place. Peer to Peer completed and Auth received. [facility]'s admission provided room number and number for report. RN calling report, TOC will fax DC summary and set up EMS. Medical necessity printed. CM updated her daughter with the discharge plan.     Final next level of care: Skilled Nursing Facility Barriers to Discharge: Barriers Resolved   Patient Goals and CMS Choice   Discharge Placement                 Patient to be transferred to facility by: EMS Name of family member notified: [name] Patient and family notified of of transfer: 11/23/22  Discharge Plan and Services Additional resources added to the After Visit Summary for                                       Social Determinants of Health (SDOH) Interventions SDOH Screenings   Food Insecurity: No Food Insecurity (11/17/2022)  Housing: Patient Declined (11/17/2022)  Transportation Needs: No Transportation Needs (11/17/2022)  Utilities: Not At Risk (11/17/2022)  Financial Resource Strain: Low Risk  (06/29/2022)   Received from [facility], [facility]  Physical Activity: Inactive (06/29/2022)   Received from [facility], [facility]  Social Connections: Moderately Integrated (06/29/2022)   Received from [facility], [facility]  Stress: No Stress Concern Present (06/29/2022)   Received from [facility], [facility]  Tobacco Use: Low Risk  (11/17/2022)  Health Literacy: Low Risk  (06/29/2022)   Received from [facility], [facility]    Readmission Risk Interventions    11/19/2022    3:12 PM 08/26/2020   11:01 AM  Readmission Risk Prevention Plan  Transportation Screening Complete Complete   PCP or Specialist Appt within 5-7 Days Not Complete   Home Care Screening Complete Complete  Medication Review (RN CM) Complete Complete

## 2022-11-23 NOTE — Progress Notes (Signed)
Patient being discharged to [facility] called and given to Erven Colla, LPN. [facility] to transport patient to awaiting facility. IV discontinued,catheter intact. Family at the bedside.

## 2022-11-23 NOTE — Consult Note (Signed)
[facility] Customer service manager [facility]) Accountable Care Organization (ACO) [facility] Liaison Note  11/23/2022  [name] Petersen May 09, 1926 [id number]  Location: [facility] Liaison screened the patient remotely at [facility].  Insurance: [employer]   [name] Petersen is a 89 y.o. female who is a Primary Care Patient of Pcp, No. The patient was screened for 7 and 30 day readmission hospitalization with noted medium risk score for unplanned readmission risk with 3 IP/2 ED in 6 months.  The patient was assessed for potential [facility]) Care Management service needs for post hospital transition for care coordination. Review of patient's electronic medical record reveals patient was admitted with Atrial Flutter with RVR.  Liaison spoke with pt's daughter [name] and [name]) concerning care management services. Daughter indicated pt will go to [facility] in [place] today. Facility will provide pt's needs via care management services. Further discuss concerning community services daughter requested contacts. Liaison provided the main office and this hospital liaison contact for future follow up on any resources that maybe needed.  [facility] Care Management/Population Health does not replace or interfere with any arrangements made by the Inpatient Transition of Care team.   For questions contact:   Elliot Cousin, RN, [facility] Liaison [place]   Population Health Office Hours MTWF  8:00 am-6:00 pm Off on Thursday [phone] mobile [phone] [Office toll free line] Office Hours are M-F 8:30 - 5 pm [email or web address]

## 2022-11-23 NOTE — Discharge Summary (Signed)
Physician Discharge Summary   Patient: [name] Petersen MRN: [id number] DOB: May 08, 1926  Admit date:     11/17/2022  Discharge date: 11/23/22  Discharge Physician: Vassie Loll   PCP: Pcp, No   Recommendations at discharge:  Repeat CBC in 1 week to follow hemoglobin trend/stability Repeat basic metabolic panel to follow a class renal function Reassess blood pressure and adjust antihypertensive treatment as needed Make sure patient follow-up with cardiology service as instructed.  Discharge Diagnoses: Principal Problem:   Atrial fibrillation with RVR (HCC) Active Problems:   Essential hypertension   Stage 3 chronic kidney disease (HCC)   Hyperlipidemia   Acute ischemic stroke [facility])  Brief hospital admission narrative: As per Dr. Randol Kern H&P written on 11/17/2022 [name] Petersen  is a 89 y.o. female, with medical history significant for, stroke, peripheral artery disease, diabetes mellitus, left BKA.  With recent hospitalization due to A-fib with RVR, she is discharged on 11/10/2022, she was discharged on Cardizem CD to 40 mg oral daily, and her metoprolol has been discontinued as she reported has been giving her fatigue. -Patient was sent to ED today for uncontrolled heart rate, she was noted to have heart rate in the 140s-150s in the facility, so she was sent to ED for further evaluation, any chest pain, but she does report some dyspnea, but she has no hypoxia, no leg edema, no oxygen requirement, she denies fever, cough or chills -In ED patient was noted to have heart rate in the 150s, she was started on Cardizem drip, workup significant for stable creatinine at 1.27, potassium of 3.8, chest x-ray still pending, BNP still pending, [facility] consulted to admit  Assessment and Plan: 1-A-fib with RVR -Currently third hospitalization secondary to uncontrolled atrial fibrillation -Patient denies chest pain and expressed no feeling palpitations or heart racing at the moment. -off  amiodarone drip and using orally 200 mg twice a day for three weeks; continue daily Cardizem extended release 360 mg now. -Close monitoring of patient electrolytes with goal for potassium above 4 and magnesium above 2. -Patient previously unable to tolerate the use of beta-blocker -If unable to control rate will require initiation of amiodarone. -Continue Eliquis for secondary prevention at the moment.   2-diastolic heart failure -For the most part compensated; as present at the moment of admission some dyspnea probably driven by RVR component on presentation -Continue oral Lasix, daily weights and strict I's and O's -Continue to low-sodium diet discussed with patient.   3-history of CVA; with concerns for new stroke. -Continue Eliquis for secondary prevention -on 11/18/22 Acutely demonstrated transient episode of right upper extremity weakness; CT scan demonstrating no acute intracranial hemorrhage. Patient's daughter not interested at the moment for further stroke work up. -by 11/19/22 symptoms have further worsen and after discussing with neurology and further discussing with family concenst given for MRI, MRA, carotid dopplers and base on results decision regarding future preventive management (possible eliquis failure if dealing with embolic source). -After reviewing MRI/MRI results there is a combination of significant vascular disease and new left frontal cortex stroke.  Following neurology recommendations we will continue the use of Eliquis and add 81 mg baby aspirin on daily basis. -So far tolerating medication well; no signs of overt bleeding. -Continue GI prophylaxis with the use of daily Protonix.   4-chronic kidney disease stage IIIb -Stable and at baseline -Continue to follow renal function intermittently to assist trend/stability -Continue avoiding hypotension and minimizing nephrotoxic agents as much as possible.   5-hypertension -Continue current  antihypertensive agent -Heart  healthy diet discussed with patient. -Continue to follow vital signs.   6-right breast cancer -Stage Ib triple positive -Continue outpatient follow-up with oncology service. -Family and patient has decided to stop the use of Femara.   7-peripheral vascular disease/phantom limb pain: Patient is status post left BKA -Continue gabapentin at adjusted dose..   8-hyperlipidemia -Continue statin   9-type 2 diabetes with neuropathy -Currently controlled with diet -Continue to follow CBGs fluctuation -Continue the use of Neurontin, dose adjusted to twice a day.   10-physical deconditioning -Patient has been seen by physical therapy with recommendations for SNF at discharge.   11-constipation -Continue treatment with Senokot, MiraLAX and as needed Dulcolax suppository. -Maintain adequate hydration increase physical activity..  Consultants: Cardiology, neurology palliative care Procedures performed: See below for x-ray reports. Disposition: Skilled nursing facility Diet recommendation: Heart healthy/modified carbohydrate diet.   DISCHARGE MEDICATION: Allergies as of 11/23/2022       Reactions   Prednisone Other (See Comments)   Fatigue   Scopace [scopolamine] Other (See Comments)   Hallucinations        Medication List     STOP taking these medications    letrozole 2.5 MG tablet Commonly known as: FEMARA       TAKE these medications    acetaminophen 325 MG tablet Commonly known as: TYLENOL Take 3 tablets (975 mg total) by mouth at bedtime as needed for mild pain.   amiodarone 200 MG tablet Commonly known as: PACERONE Twice a day for 3 weeks; then 200 mg daily.   apixaban 5 MG Tabs tablet Commonly known as: ELIQUIS Take 1 tablet (5 mg total) by mouth 2 (two) times daily.   BETA CAROTENE PO Take 1 tablet by mouth daily.   Biofreeze 4 % Gel Generic drug: Menthol (Topical Analgesic) Apply topically as needed.   cyanocobalamin 1000 MCG tablet Take 1,000 mcg  by mouth daily.   diltiazem 360 MG 24 hr capsule Commonly known as: CARDIZEM CD Take 1 capsule (360 mg total) by mouth daily. Start taking on: November 24, 2022 What changed:  medication strength how much to take   furosemide 20 MG tablet Commonly known as: LASIX Take 20 mg by mouth daily.   gabapentin 100 MG capsule Commonly known as: NEURONTIN Take 1 capsule (100 mg total) by mouth 2 (two) times daily. What changed: when to take this   hydrocortisone 2.5 % rectal cream Commonly known as: ANUSOL-HC Place 1 Application rectally 2 (two) times daily.   I-Vite Tabs Take 1 tablet by mouth daily.   lovastatin 20 MG tablet Commonly known as: MEVACOR Take 20 mg by mouth daily at 6 PM.   nystatin ointment Commonly known as: MYCOSTATIN Apply 1 Application topically 2 (two) times daily.   pantoprazole 40 MG tablet Commonly known as: PROTONIX Take 1 tablet (40 mg total) by mouth daily. Start taking on: November 24, 2022   rOPINIRole 0.25 MG tablet Commonly known as: REQUIP Take 1 tablet (0.25 mg total) by mouth 3 (three) times daily.   sennosides-docusate sodium 8.6-50 MG tablet Commonly known as: SENOKOT-S Take 1 tablet by mouth See admin instructions. 1 tablet at bedtime every other day for constipation   TUMS PO Take 2 tablets by mouth every 6 (six) hours as needed (acid reflux).   Vitamin D3 50 MCG (2000 UT) Tabs Take 2,000 Units by mouth daily.        Contact information for follow-up providers     Laurann Montana, PA-C  Follow up on 12/09/2022.   Specialties: Cardiology, Radiology Why: Cardiology Hospital Follow-up on 12/09/2022 at 3:30 PM. Contact information: [address] [place] [address] [phone]              Contact information for after-discharge care     Destination     [facility] Preferred SNF .   Service: Skilled Nursing Contact information: [address] [place]  [address] [phone]                    Discharge Exam: Ceasar Mons Weights   11/19/22 0500 11/21/22 0435 11/22/22 0531  Weight: 73.5 kg 72.6 kg 72.9 kg   General exam: no new focal neurologic dficit, experiencing less discomfort with restless leg syndrome. Good sat on RA. Respiratory system: Clear to auscultation. Respiratory effort normal. No using accessory muscles. Cardiovascular system: irregular, rate much better controlled. No CP, no rubs or gallops.  Gastrointestinal system: Abdomen is obese, nondistended, soft and nontender. No organomegaly or masses felt. Normal bowel sounds heard. Central nervous system:No new focal neurological deficits. Extremities: No cyanosis, no clubbing, left BKA. Skin: No petechiae. Psychiatry: stable flat affect appreciated.  Condition at discharge: Stable and improved.  The results of significant diagnostics from this hospitalization (including imaging, microbiology, ancillary and laboratory) are listed below for reference.   Imaging Studies: MR BRAIN WO CONTRAST  Result Date: 11/19/2022 CLINICAL DATA:  Neuro deficit with acute stroke suspected. EXAM: MRI HEAD WITHOUT CONTRAST MRA HEAD WITHOUT CONTRAST TECHNIQUE: Multiplanar, multi-echo pulse sequences of the brain and surrounding structures were acquired without intravenous contrast. Angiographic images of the Circle of Willis were acquired using MRA technique without intravenous contrast. COMPARISON:  Head CT from yesterday. FINDINGS: MRI HEAD FINDINGS Brain: Restricted diffusion in the left precentral gyrus at the vertex with cluster of small superior left frontal white matter infarcts in the vicinity. Extensive chronic small vessel ischemia in the deep cerebral white matter with chronic small vessel infarcts in the deep gray nuclei and deep white matter tracks. Cerebral volume loss is generalized. No collection, mass, or hydrocephalus. Vascular: Major flow voids are preserved. Skull and upper  cervical spine: Normal marrow signal. Sinuses/Orbits: No acute or significant finding. MRA HEAD FINDINGS Significant motion degradation. Fetal type right PCA and dominant right vertebral artery. Essentially no flow seen in the left V4 segment until just before the basilar, likely retrograde. Advanced bilateral M1 stenosis with flow gap. Extensive atheromatous irregularity of medium size vessels diffusely, accentuated by motion. No gross aneurysm or vascular malformation. IMPRESSION: Brain MRI: 1. Small acute infarct in the superior left frontal white matter and cortex, including the left precentral gyrus. 2. Advanced chronic small vessel disease. MRA: 1. Very degraded by motion but still notable for advanced intracranial atherosclerosis. 2. Advanced bilateral M1 stenosis with flow gap. Extensive atheromatous irregularity of medium size branches. 3. No flow seen in the non dominant left vertebral artery until just before the basilar, likely retrograde. Electronically Signed   By: Tiburcio Pea M.D.   On: 11/19/2022 17:37   MR ANGIO HEAD WO CONTRAST  Result Date: 11/19/2022 CLINICAL DATA:  Neuro deficit with acute stroke suspected. EXAM: MRI HEAD WITHOUT CONTRAST MRA HEAD WITHOUT CONTRAST TECHNIQUE: Multiplanar, multi-echo pulse sequences of the brain and surrounding structures were acquired without intravenous contrast. Angiographic images of the Circle of Willis were acquired using MRA technique without intravenous contrast. COMPARISON:  Head CT from yesterday. FINDINGS: MRI HEAD FINDINGS Brain: Restricted diffusion in  the left precentral gyrus at the vertex with cluster of small superior left frontal white matter infarcts in the vicinity. Extensive chronic small vessel ischemia in the deep cerebral white matter with chronic small vessel infarcts in the deep gray nuclei and deep white matter tracks. Cerebral volume loss is generalized. No collection, mass, or hydrocephalus. Vascular: Major flow voids are  preserved. Skull and upper cervical spine: Normal marrow signal. Sinuses/Orbits: No acute or significant finding. MRA HEAD FINDINGS Significant motion degradation. Fetal type right PCA and dominant right vertebral artery. Essentially no flow seen in the left V4 segment until just before the basilar, likely retrograde. Advanced bilateral M1 stenosis with flow gap. Extensive atheromatous irregularity of medium size vessels diffusely, accentuated by motion. No gross aneurysm or vascular malformation. IMPRESSION: Brain MRI: 1. Small acute infarct in the superior left frontal white matter and cortex, including the left precentral gyrus. 2. Advanced chronic small vessel disease. MRA: 1. Very degraded by motion but still notable for advanced intracranial atherosclerosis. 2. Advanced bilateral M1 stenosis with flow gap. Extensive atheromatous irregularity of medium size branches. 3. No flow seen in the non dominant left vertebral artery until just before the basilar, likely retrograde. Electronically Signed   By: Tiburcio Pea M.D.   On: 11/19/2022 17:37   US Carotid Bilateral  Result Date: 11/19/2022 CLINICAL DATA:  89 year old female with a history of stroke EXAM: BILATERAL CAROTID DUPLEX ULTRASOUND TECHNIQUE: Wallace Cullens scale imaging, color Doppler and duplex ultrasound were performed of bilateral carotid and vertebral arteries in the neck. COMPARISON:  None Available. FINDINGS: Criteria: Quantification of carotid stenosis is based on velocity parameters that correlate the residual internal carotid diameter with NASCET-based stenosis levels, using the diameter of the distal internal carotid lumen as the denominator for stenosis measurement. The following velocity measurements were obtained: RIGHT ICA:  Systolic 68 cm/sec, Diastolic 30 cm/sec CCA:  45 cm/sec SYSTOLIC ICA/CCA RATIO:  1.5 ECA:  141 cm/sec LEFT ICA:  Systolic 78 cm/sec, Diastolic 21 cm/sec CCA:  59 cm/sec SYSTOLIC ICA/CCA RATIO:  1.3 ECA:  368 cm/sec Right  Brachial SBP: Not acquired Left Brachial SBP: Not acquired RIGHT CAROTID ARTERY: No significant calcifications of the right common carotid artery. Intermediate waveform maintained. Heterogeneous and partially calcified plaque at the right carotid bifurcation. No significant lumen shadowing. Low resistance waveform of the right ICA. No significant tortuosity. RIGHT VERTEBRAL ARTERY: Antegrade flow with low resistance waveform. LEFT CAROTID ARTERY: No significant calcifications of the left common carotid artery. Intermediate waveform maintained. Heterogeneous and partially calcified plaque at the left carotid bifurcation without significant lumen shadowing. Low resistance waveform of the left ICA. No significant tortuosity. LEFT VERTEBRAL ARTERY: Not visualized. Additional: Irregular cardiac rhythm during the exam IMPRESSION: Color duplex indicates minimal heterogeneous and calcified plaque, with no hemodynamically significant stenosis by duplex criteria in the extracranial cerebrovascular circulation. Left vertebral artery not visualized. Irregular cardiac rhythm during the exam. Correlation with any history of arrhythmia or prior EKG may be useful. Signed, Yvone Neu. Miachel Roux, RPVI [facility] Radiology Electronically Signed   By: Gilmer Mor D.O.   On: 11/19/2022 16:01   CT HEAD WO CONTRAST ( )  Result Date: 11/18/2022 CLINICAL DATA:  Transient ischemic attack (TIA) EXAM: CT HEAD WITHOUT CONTRAST TECHNIQUE: Contiguous axial images were obtained from the base of the skull through the vertex without intravenous contrast. RADIATION DOSE REDUCTION: This exam was performed according to the departmental dose-optimization program which includes automated exposure control, adjustment of the mA  and/or kV according to patient size and/or use of iterative reconstruction technique. COMPARISON:  CT Head 04/18/07 FINDINGS: Brain: No evidence of acute infarction,  hemorrhage, hydrocephalus, extra-axial collection or mass lesion/mass effect. Sequela of moderate chronic microvascular ischemic change. There is chronic infarct in the right basal ganglia. Compared to prior exam there is a new infarct in the left caudate head. Vascular: No hyperdense vessel or unexpected calcification. Skull: Normal. Negative for fracture or focal lesion. Sinuses/Orbits: No middle ear or mastoid effusion. Paranasal sinuses are clear. Bilateral lens replacement. Orbits are otherwise unremarkable. Other: None. IMPRESSION: Compared to prior exam there is a new infarct in the left caudate head, which is likely chronic, but age indeterminate. No evidence of acute intracranial hemorrhage. Electronically Signed   By: Lorenza Cambridge M.D.   On: 11/18/2022 14:22   DG Chest Port 1 View  Result Date: 11/17/2022 CLINICAL DATA:  Dyspnea EXAM: PORTABLE CHEST 1 VIEW COMPARISON:  11/11/2022 FINDINGS: Borderline cardiopericardial silhouette. Calcified aorta. Slight linear opacity lung bases likely scar or atelectasis. No pneumothorax, effusion or consolidation. Film is rotated to the left. Overlapping cardiac leads. Osteopenia. Degenerative changes of the spine and shoulders. IMPRESSION: Mild basilar atelectasis.  Rotated radiograph. Electronically Signed   By: Karen Kays M.D.   On: 11/17/2022 16:16   DG Chest Port 1 View  Result Date: 11/11/2022 CLINICAL DATA:  89 year old female with shortness of breath. Orthopnea. EXAM: PORTABLE CHEST 1 VIEW COMPARISON:  Chest radiograph 11/09/2022 and earlier. FINDINGS: Portable AP upright view at 0539 hours. Stable to mildly improved lung volumes. Stable mild cardiomegaly and mediastinal contours. No pneumothorax. No pleural effusion or consolidation. Diffuse pulmonary interstitial opacity, suspicious for vascular congestion and not significantly changed from radiographs since May. Paucity of bowel gas in the visible abdomen. No acute osseous abnormality identified.  IMPRESSION: Ongoing pulmonary vascular congestion, suspicious for interstitial edema. No pleural effusion or consolidation. Electronically Signed   By: Odessa Fleming M.D.   On: 11/11/2022 05:57   DG Shoulder Left  Result Date: 11/09/2022 CLINICAL DATA:  Fall. EXAM: LEFT SHOULDER - 2+ VIEW COMPARISON:  None Available. FINDINGS: There is no evidence of fracture or dislocation. There is no evidence of arthropathy or other focal bone abnormality. Soft tissues are unremarkable. IMPRESSION: Negative. Electronically Signed   By: Lupita Raider M.D.   On: 11/09/2022 15:25   DG Chest 2 View  Result Date: 11/09/2022 CLINICAL DATA:  Shortness of breath. EXAM: CHEST - 2 VIEW COMPARISON:  Sep 11, 2022. FINDINGS: Stable cardiomegaly. Bibasilar interstitial densities are noted concerning for pulmonary edema or possibly atelectasis. Small bilateral pleural effusions are noted. Bony thorax is unremarkable. IMPRESSION: Bibasilar pulmonary edema or atelectasis is noted with associated pleural effusions. Electronically Signed   By: Lupita Raider M.D.   On: 11/09/2022 15:21    Microbiology: Results for orders placed or performed during the hospital encounter of 11/17/22  MRSA Next Gen by PCR, Nasal     Status: None   Collection Time: 11/17/22  4:15 PM   Specimen: Nasal Mucosa; Nasal Swab  Result Value Ref Range Status   MRSA by PCR Next Gen NOT DETECTED NOT DETECTED Final    Comment: (NOTE) The GeneXpert MRSA Assay (FDA approved for NASAL specimens only), is one component of a comprehensive MRSA colonization surveillance program. It is not intended to diagnose MRSA infection nor to guide or monitor treatment for MRSA infections. Test performance is not FDA approved in patients less than 21 years old. Performed at  [facility], [address] [place], [place] [address]     Labs: CBC: Recent Labs  Lab 11/17/22 1359 11/22/22 0519  WBC 6.2 6.1  NEUTROABS 3.8  --   HGB 12.2 12.1  HCT 39.0 38.3  MCV 91.8 90.3   PLT 270 302   Basic Metabolic Panel: Recent Labs  Lab 11/17/22 1359 11/18/22 0505 11/19/22 0002 11/21/22 1056  NA 136 137 135 134*  K 3.8 4.0 4.1 3.9  CL 101 102 100 97*  CO2 26 26 26 28   GLUCOSE 183* 148* 162* 180*  BUN 15 14 14 19   CREATININE 1.27* 1.16* 1.19* 1.15*  CALCIUM 8.7* 8.8* 9.1 8.8*  MG 2.0  --  2.1  --    Liver Function Tests: Recent Labs  Lab 11/17/22 1359 11/18/22 0505  AST 23 20  ALT 18 17  ALKPHOS 75 74  BILITOT 0.7 0.5  PROT 5.9* 5.8*  ALBUMIN 3.2* 3.2*   CBG: No results for input(s): "GLUCAP" in the last 168 hours.  Discharge time spent: greater than 30 minutes.  Signed: Vassie Loll, MD [facility] 11/23/2022

## 2022-11-23 NOTE — Care Management Important Message (Signed)
Important Message  Patient Details  Name: [name] Petersen MRN: [id number] Date of Birth: [birth year]   Medicare Important Message Given:  Yes     Corey Harold 11/23/2022, 2:44 PM

## 2022-11-23 NOTE — Progress Notes (Signed)
Physical Therapy Treatment Patient Details Name: [name] Petersen MRN: [id number] DOB: [birth year] Today's Date: 11/23/2022   History of Present Illness [name] Petersen  is a 89 y.o. female, with medical history significant for, stroke, peripheral artery disease, diabetes mellitus, left BKA.  With recent hospitalization due to A-fib with RVR, she is discharged on 11/10/2022, she was discharged on Cardizem CD to 40 mg oral daily, and her metoprolol has been discontinued as she reported has been giving her fatigue.  -Patient was sent to ED today for uncontrolled heart rate, she was noted to have heart rate in the 140s-150s in the facility, so she was sent to ED for further evaluation, any chest pain, but she does report some dyspnea, but she has no hypoxia, no leg edema, no oxygen requirement, she denies fever, cough or chills    PT Comments  Patient able to keep trunk in mid line up to 20-30 seconds without supporting self using LUE, held trunk in midline up to 1-2 minutes supporting self with LUE, frequent leaning forward once fatigued and tolerated sitting up at bedside for approximately 20 minutes before c/o of fatigue.  Patient required Mod/max assist for repositioning when put back to bed.  Patient will benefit from continued skilled physical therapy in hospital and recommended venue below to increase strength, balance, endurance for safe ADLs and gait.     If plan is discharge home, recommend the following: A lot of help with bathing/dressing/bathroom;A lot of help with walking and/or transfers;Assistance with cooking/housework;Help with stairs or ramp for entrance   Can travel by private vehicle     No  Equipment Recommendations  None recommended by PT    Recommendations for Other Services       Precautions / Restrictions Precautions Precautions: Fall Restrictions Weight Bearing Restrictions: No     Mobility  Bed Mobility Overal bed mobility: Needs Assistance Bed Mobility: Supine to Sit,  Sit to Supine, Rolling Rolling: Mod assist   Supine to sit: Max assist Sit to supine: Mod assist, Max assist   General bed mobility comments: slow labored movement wtih limited use of right side    Transfers                        Ambulation/Gait                   Stairs             Wheelchair Mobility     Tilt Bed    Modified Rankin (Stroke Patients Only)       Balance Overall balance assessment: Needs assistance Sitting-balance support: Feet supported, No upper extremity supported Sitting balance-Leahy Scale: Poor Sitting balance - Comments: frequent leaning/falling forward due to poor trunk control, right sided weakness Postural control: Right lateral lean                                  Cognition Arousal/Alertness: Awake/alert Behavior During Therapy: WFL for tasks assessed/performed Overall Cognitive Status: Within Functional Limits for tasks assessed                                          Exercises General Exercises - Lower Extremity Heel Slides: AAROM, Strengthening, Right, 10 reps Hip ABduction/ADduction: AAROM, Strengthening, Right, 10 reps Other Exercises Other Exercises:  bilateral shoulder flexion, internal/external rotation using using left hand to move RUE x 10 reps each    General Comments        Pertinent Vitals/Pain Pain Assessment Pain Assessment: No/denies pain    Home Living                          Prior Function            PT Goals (current goals can now be found in the care plan section) Acute Rehab PT Goals Patient Stated Goal: return home (ALF) PT Goal Formulation: With patient/family Time For Goal Achievement: 12/03/22 Potential to Achieve Goals: Fair Progress towards PT goals: Progressing toward goals    Frequency    Min 3X/week      PT Plan Current plan remains appropriate    Co-evaluation              AM-PAC PT "6 Clicks" Mobility    Outcome Measure  Help needed turning from your back to your side while in a flat bed without using bedrails?: A Lot Help needed moving from lying on your back to sitting on the side of a flat bed without using bedrails?: A Lot Help needed moving to and from a bed to a chair (including a wheelchair)?: Total Help needed standing up from a chair using your arms (e.g., wheelchair or bedside chair)?: Total Help needed to walk in hospital room?: Total Help needed climbing 3-5 steps with a railing? : Total 6 Click Score: 8    End of Session   Activity Tolerance: Patient tolerated treatment well;Patient limited by fatigue Patient left: in bed;with call bell/phone within reach;with family/visitor present Nurse Communication: Mobility status PT Visit Diagnosis: Unsteadiness on feet (R26.81);Other abnormalities of gait and mobility (R26.89);Muscle weakness (generalized) (M62.81)     Time: 1610-9604 PT Time Calculation (min) (ACUTE ONLY): 28 min  Charges:    $Therapeutic Exercise: 8-22 mins $Therapeutic Activity: 8-22 mins PT General Charges $$ ACUTE PT VISIT: 1 Visit                     12:35 PM, 11/23/22 Ocie Bob, MPT Physical Therapist with [facility] [phone] office [phone] mobile phone

## 2022-11-24 DIAGNOSIS — I10 Essential (primary) hypertension: Secondary | ICD-10-CM | POA: Diagnosis not present

## 2022-11-24 DIAGNOSIS — I130 Hypertensive heart and chronic kidney disease with heart failure and stage 1 through stage 4 chronic kidney disease, or unspecified chronic kidney disease: Secondary | ICD-10-CM | POA: Diagnosis not present

## 2022-11-24 DIAGNOSIS — M6281 Muscle weakness (generalized): Secondary | ICD-10-CM | POA: Diagnosis not present

## 2022-11-24 DIAGNOSIS — I5033 Acute on chronic diastolic (congestive) heart failure: Secondary | ICD-10-CM | POA: Diagnosis not present

## 2022-11-24 DIAGNOSIS — I639 Cerebral infarction, unspecified: Secondary | ICD-10-CM | POA: Diagnosis not present

## 2022-11-24 DIAGNOSIS — N1832 Chronic kidney disease, stage 3b: Secondary | ICD-10-CM | POA: Diagnosis not present

## 2022-11-24 DIAGNOSIS — E1122 Type 2 diabetes mellitus with diabetic chronic kidney disease: Secondary | ICD-10-CM | POA: Diagnosis not present

## 2022-11-24 DIAGNOSIS — I4891 Unspecified atrial fibrillation: Secondary | ICD-10-CM | POA: Diagnosis not present

## 2022-11-24 DIAGNOSIS — I739 Peripheral vascular disease, unspecified: Secondary | ICD-10-CM | POA: Diagnosis not present

## 2022-11-26 DIAGNOSIS — I7389 Other specified peripheral vascular diseases: Secondary | ICD-10-CM | POA: Diagnosis not present

## 2022-11-26 DIAGNOSIS — I130 Hypertensive heart and chronic kidney disease with heart failure and stage 1 through stage 4 chronic kidney disease, or unspecified chronic kidney disease: Secondary | ICD-10-CM | POA: Diagnosis not present

## 2022-11-26 DIAGNOSIS — M6281 Muscle weakness (generalized): Secondary | ICD-10-CM | POA: Diagnosis not present

## 2022-11-26 DIAGNOSIS — I10 Essential (primary) hypertension: Secondary | ICD-10-CM | POA: Diagnosis not present

## 2022-11-26 DIAGNOSIS — I4891 Unspecified atrial fibrillation: Secondary | ICD-10-CM | POA: Diagnosis not present

## 2022-11-26 DIAGNOSIS — I5033 Acute on chronic diastolic (congestive) heart failure: Secondary | ICD-10-CM | POA: Diagnosis not present

## 2022-11-26 DIAGNOSIS — N1832 Chronic kidney disease, stage 3b: Secondary | ICD-10-CM | POA: Diagnosis not present

## 2022-11-26 DIAGNOSIS — E1122 Type 2 diabetes mellitus with diabetic chronic kidney disease: Secondary | ICD-10-CM | POA: Diagnosis not present

## 2022-11-26 DIAGNOSIS — I4892 Unspecified atrial flutter: Secondary | ICD-10-CM | POA: Diagnosis not present

## 2022-11-26 DIAGNOSIS — I639 Cerebral infarction, unspecified: Secondary | ICD-10-CM | POA: Diagnosis not present

## 2022-11-26 DIAGNOSIS — R2689 Other abnormalities of gait and mobility: Secondary | ICD-10-CM | POA: Diagnosis not present

## 2022-11-29 DIAGNOSIS — M6281 Muscle weakness (generalized): Secondary | ICD-10-CM | POA: Diagnosis not present

## 2022-11-29 DIAGNOSIS — I639 Cerebral infarction, unspecified: Secondary | ICD-10-CM | POA: Diagnosis not present

## 2022-11-29 DIAGNOSIS — I130 Hypertensive heart and chronic kidney disease with heart failure and stage 1 through stage 4 chronic kidney disease, or unspecified chronic kidney disease: Secondary | ICD-10-CM | POA: Diagnosis not present

## 2022-11-29 DIAGNOSIS — I10 Essential (primary) hypertension: Secondary | ICD-10-CM | POA: Diagnosis not present

## 2022-11-29 DIAGNOSIS — I5033 Acute on chronic diastolic (congestive) heart failure: Secondary | ICD-10-CM | POA: Diagnosis not present

## 2022-11-29 DIAGNOSIS — I4891 Unspecified atrial fibrillation: Secondary | ICD-10-CM | POA: Diagnosis not present

## 2022-11-29 DIAGNOSIS — I7389 Other specified peripheral vascular diseases: Secondary | ICD-10-CM | POA: Diagnosis not present

## 2022-11-29 DIAGNOSIS — E1122 Type 2 diabetes mellitus with diabetic chronic kidney disease: Secondary | ICD-10-CM | POA: Diagnosis not present

## 2022-11-29 DIAGNOSIS — N1832 Chronic kidney disease, stage 3b: Secondary | ICD-10-CM | POA: Diagnosis not present

## 2022-11-30 DIAGNOSIS — I739 Peripheral vascular disease, unspecified: Secondary | ICD-10-CM | POA: Diagnosis not present

## 2022-11-30 DIAGNOSIS — I130 Hypertensive heart and chronic kidney disease with heart failure and stage 1 through stage 4 chronic kidney disease, or unspecified chronic kidney disease: Secondary | ICD-10-CM | POA: Diagnosis not present

## 2022-11-30 DIAGNOSIS — I5033 Acute on chronic diastolic (congestive) heart failure: Secondary | ICD-10-CM | POA: Diagnosis not present

## 2022-11-30 DIAGNOSIS — I639 Cerebral infarction, unspecified: Secondary | ICD-10-CM | POA: Diagnosis not present

## 2022-11-30 DIAGNOSIS — M6281 Muscle weakness (generalized): Secondary | ICD-10-CM | POA: Diagnosis not present

## 2022-11-30 DIAGNOSIS — I10 Essential (primary) hypertension: Secondary | ICD-10-CM | POA: Diagnosis not present

## 2022-11-30 DIAGNOSIS — N1832 Chronic kidney disease, stage 3b: Secondary | ICD-10-CM | POA: Diagnosis not present

## 2022-11-30 DIAGNOSIS — I4891 Unspecified atrial fibrillation: Secondary | ICD-10-CM | POA: Diagnosis not present

## 2022-11-30 DIAGNOSIS — E1122 Type 2 diabetes mellitus with diabetic chronic kidney disease: Secondary | ICD-10-CM | POA: Diagnosis not present

## 2022-12-01 DIAGNOSIS — N1832 Chronic kidney disease, stage 3b: Secondary | ICD-10-CM | POA: Diagnosis not present

## 2022-12-01 DIAGNOSIS — I10 Essential (primary) hypertension: Secondary | ICD-10-CM | POA: Diagnosis not present

## 2022-12-01 DIAGNOSIS — I130 Hypertensive heart and chronic kidney disease with heart failure and stage 1 through stage 4 chronic kidney disease, or unspecified chronic kidney disease: Secondary | ICD-10-CM | POA: Diagnosis not present

## 2022-12-01 DIAGNOSIS — E1122 Type 2 diabetes mellitus with diabetic chronic kidney disease: Secondary | ICD-10-CM | POA: Diagnosis not present

## 2022-12-01 DIAGNOSIS — R2689 Other abnormalities of gait and mobility: Secondary | ICD-10-CM | POA: Diagnosis not present

## 2022-12-01 DIAGNOSIS — I4892 Unspecified atrial flutter: Secondary | ICD-10-CM | POA: Diagnosis not present

## 2022-12-01 DIAGNOSIS — M6281 Muscle weakness (generalized): Secondary | ICD-10-CM | POA: Diagnosis not present

## 2022-12-01 DIAGNOSIS — I4891 Unspecified atrial fibrillation: Secondary | ICD-10-CM | POA: Diagnosis not present

## 2022-12-01 DIAGNOSIS — I7389 Other specified peripheral vascular diseases: Secondary | ICD-10-CM | POA: Diagnosis not present

## 2022-12-01 DIAGNOSIS — I5033 Acute on chronic diastolic (congestive) heart failure: Secondary | ICD-10-CM | POA: Diagnosis not present

## 2022-12-01 DIAGNOSIS — I639 Cerebral infarction, unspecified: Secondary | ICD-10-CM | POA: Diagnosis not present

## 2022-12-03 DIAGNOSIS — I4891 Unspecified atrial fibrillation: Secondary | ICD-10-CM | POA: Diagnosis not present

## 2022-12-03 DIAGNOSIS — I639 Cerebral infarction, unspecified: Secondary | ICD-10-CM | POA: Diagnosis not present

## 2022-12-03 DIAGNOSIS — I130 Hypertensive heart and chronic kidney disease with heart failure and stage 1 through stage 4 chronic kidney disease, or unspecified chronic kidney disease: Secondary | ICD-10-CM | POA: Diagnosis not present

## 2022-12-03 DIAGNOSIS — I10 Essential (primary) hypertension: Secondary | ICD-10-CM | POA: Diagnosis not present

## 2022-12-03 DIAGNOSIS — E1122 Type 2 diabetes mellitus with diabetic chronic kidney disease: Secondary | ICD-10-CM | POA: Diagnosis not present

## 2022-12-03 DIAGNOSIS — M6281 Muscle weakness (generalized): Secondary | ICD-10-CM | POA: Diagnosis not present

## 2022-12-03 DIAGNOSIS — I5033 Acute on chronic diastolic (congestive) heart failure: Secondary | ICD-10-CM | POA: Diagnosis not present

## 2022-12-03 DIAGNOSIS — I7389 Other specified peripheral vascular diseases: Secondary | ICD-10-CM | POA: Diagnosis not present

## 2022-12-03 DIAGNOSIS — N1832 Chronic kidney disease, stage 3b: Secondary | ICD-10-CM | POA: Diagnosis not present

## 2022-12-03 DIAGNOSIS — I739 Peripheral vascular disease, unspecified: Secondary | ICD-10-CM | POA: Diagnosis not present

## 2022-12-07 DIAGNOSIS — I639 Cerebral infarction, unspecified: Secondary | ICD-10-CM | POA: Diagnosis not present

## 2022-12-07 DIAGNOSIS — I130 Hypertensive heart and chronic kidney disease with heart failure and stage 1 through stage 4 chronic kidney disease, or unspecified chronic kidney disease: Secondary | ICD-10-CM | POA: Diagnosis not present

## 2022-12-07 DIAGNOSIS — I739 Peripheral vascular disease, unspecified: Secondary | ICD-10-CM | POA: Diagnosis not present

## 2022-12-07 DIAGNOSIS — N1832 Chronic kidney disease, stage 3b: Secondary | ICD-10-CM | POA: Diagnosis not present

## 2022-12-07 DIAGNOSIS — I4891 Unspecified atrial fibrillation: Secondary | ICD-10-CM | POA: Diagnosis not present

## 2022-12-07 DIAGNOSIS — I4892 Unspecified atrial flutter: Secondary | ICD-10-CM | POA: Diagnosis not present

## 2022-12-07 DIAGNOSIS — R2689 Other abnormalities of gait and mobility: Secondary | ICD-10-CM | POA: Diagnosis not present

## 2022-12-07 DIAGNOSIS — I10 Essential (primary) hypertension: Secondary | ICD-10-CM | POA: Diagnosis not present

## 2022-12-07 DIAGNOSIS — M6281 Muscle weakness (generalized): Secondary | ICD-10-CM | POA: Diagnosis not present

## 2022-12-07 DIAGNOSIS — E1122 Type 2 diabetes mellitus with diabetic chronic kidney disease: Secondary | ICD-10-CM | POA: Diagnosis not present

## 2022-12-07 DIAGNOSIS — I5033 Acute on chronic diastolic (congestive) heart failure: Secondary | ICD-10-CM | POA: Diagnosis not present

## 2022-12-08 ENCOUNTER — Encounter: Payer: Self-pay | Admitting: Physician Assistant

## 2022-12-08 NOTE — Progress Notes (Deleted)
Cardiology Office Note    Date:  12/08/2022  ID:  [name] Petersen, DOB 1927-01-27, MRN [id number] PCP:  Oneita Hurt, No  Cardiologist:  Dina Rich, MD  Electrophysiologist:  None   Chief Complaint: ***  History of Present Illness: .    [name] Petersen is a 89 y.o. female with visit-pertinent history of paroxysmal atrial fibrillation, mild MR, mild AS, chronic HFpEF, HTN, HLD, Type II DM, CKD3b, history of CVA, macular degeneration, right breast cancer (followed at [facility] and receiving Letrozole) prior left BKA seen for follow-up.  [name] Petersen was diagnosed with atrial fibrillation during admission in 08/2022 and converted back to normal sinus rhythm with IV diltiazem. Risks and benefits of anticoagulation were reviewed and she was started on Eliquis for anticoagulation along with metoprolol. She was readmitted 10/2022 with dyspnea, AF RVR, and mild CHF exacerbation. She had had fatigue on metoprolol so was switched to diltiazem and treated wih Lasix. Said to be rate controlled at DC. She was then readmitted 7/31-8/6 with AF RVR. She was initiated on amiodarone and diltiazem was increased. The patient declined further procedures like TEE/DCCV. She also had strokelike symptoms with possible new CVA. IM notes indicated plan to add ASA to Eliquis but do not see this done. 2D Echo 08/2022 showed EF 60-65%, G2DD, mild MR, mild AS.  F/u neuro? PCP re: ASA Amio 200 bidx3wks then 200mg  daily  Persistent afib Chronic HFpEF, HTN Mild AS/MR History of stroke   Labwork independently reviewed: 11/2022 trig 70, LDL 64, K 3.9, Cr 1.15, Mg 2.1, alb 3.2, AST ALT OK, TSH wnl  ROS: .    Please see the history of present illness. Otherwise, review of systems is positive for ***.  All other systems are reviewed and otherwise negative.  Studies Reviewed: Marland Kitchen    EKG:  EKG is ordered today, personally reviewed, demonstrating ***  CV Studies: Cardiac studies reviewed are outlined and summarized above.  Otherwise please see EMR for full report.   Current Reported Medications:.    No outpatient medications have been marked as taking for the 12/09/22 encounter (Appointment) with Laurann Montana, PA-C.    Physical Exam:    VS:  There were no vitals taken for this visit.   Wt Readings from Last 3 Encounters:  11/22/22 160 lb 11.5 oz (72.9 kg)  11/11/22 158 lb 11.7 oz (72 kg)  11/10/22 159 lb 9.8 oz (72.4 kg)    GEN: Well nourished, well developed in no acute distress NECK: No JVD; No carotid bruits CARDIAC: ***RRR, no murmurs, rubs, gallops RESPIRATORY:  Clear to auscultation without rales, wheezing or rhonchi  ABDOMEN: Soft, non-tender, non-distended EXTREMITIES:  No edema; No acute deformity   Asessement and Plan:.     ***     Disposition: F/u with ***  Signed, Laurann Montana, PA-C

## 2022-12-09 ENCOUNTER — Ambulatory Visit: Payer: Medicare Other | Admitting: Physician Assistant

## 2022-12-09 DIAGNOSIS — Z515 Encounter for palliative care: Secondary | ICD-10-CM | POA: Diagnosis not present

## 2022-12-10 DIAGNOSIS — I4892 Unspecified atrial flutter: Secondary | ICD-10-CM | POA: Diagnosis not present

## 2022-12-10 DIAGNOSIS — M6281 Muscle weakness (generalized): Secondary | ICD-10-CM | POA: Diagnosis not present

## 2022-12-10 DIAGNOSIS — R2689 Other abnormalities of gait and mobility: Secondary | ICD-10-CM | POA: Diagnosis not present

## 2022-12-10 DIAGNOSIS — I639 Cerebral infarction, unspecified: Secondary | ICD-10-CM | POA: Diagnosis not present

## 2022-12-12 DIAGNOSIS — M6281 Muscle weakness (generalized): Secondary | ICD-10-CM | POA: Diagnosis not present

## 2022-12-12 DIAGNOSIS — R278 Other lack of coordination: Secondary | ICD-10-CM | POA: Diagnosis not present

## 2022-12-12 DIAGNOSIS — I4892 Unspecified atrial flutter: Secondary | ICD-10-CM | POA: Diagnosis not present

## 2022-12-12 DIAGNOSIS — R2689 Other abnormalities of gait and mobility: Secondary | ICD-10-CM | POA: Diagnosis not present

## 2022-12-12 DIAGNOSIS — I639 Cerebral infarction, unspecified: Secondary | ICD-10-CM | POA: Diagnosis not present

## 2022-12-13 DIAGNOSIS — R2689 Other abnormalities of gait and mobility: Secondary | ICD-10-CM | POA: Diagnosis not present

## 2022-12-13 DIAGNOSIS — I639 Cerebral infarction, unspecified: Secondary | ICD-10-CM | POA: Diagnosis not present

## 2022-12-13 DIAGNOSIS — I4892 Unspecified atrial flutter: Secondary | ICD-10-CM | POA: Diagnosis not present

## 2022-12-13 DIAGNOSIS — M6281 Muscle weakness (generalized): Secondary | ICD-10-CM | POA: Diagnosis not present

## 2022-12-13 DIAGNOSIS — R278 Other lack of coordination: Secondary | ICD-10-CM | POA: Diagnosis not present

## 2022-12-14 DIAGNOSIS — I4892 Unspecified atrial flutter: Secondary | ICD-10-CM | POA: Diagnosis not present

## 2022-12-14 DIAGNOSIS — I639 Cerebral infarction, unspecified: Secondary | ICD-10-CM | POA: Diagnosis not present

## 2022-12-14 DIAGNOSIS — R278 Other lack of coordination: Secondary | ICD-10-CM | POA: Diagnosis not present

## 2022-12-14 DIAGNOSIS — M6281 Muscle weakness (generalized): Secondary | ICD-10-CM | POA: Diagnosis not present

## 2022-12-14 DIAGNOSIS — R2689 Other abnormalities of gait and mobility: Secondary | ICD-10-CM | POA: Diagnosis not present

## 2022-12-15 DIAGNOSIS — R2689 Other abnormalities of gait and mobility: Secondary | ICD-10-CM | POA: Diagnosis not present

## 2022-12-15 DIAGNOSIS — I639 Cerebral infarction, unspecified: Secondary | ICD-10-CM | POA: Diagnosis not present

## 2022-12-15 DIAGNOSIS — I10 Essential (primary) hypertension: Secondary | ICD-10-CM | POA: Diagnosis not present

## 2022-12-15 DIAGNOSIS — E1122 Type 2 diabetes mellitus with diabetic chronic kidney disease: Secondary | ICD-10-CM | POA: Diagnosis not present

## 2022-12-15 DIAGNOSIS — I739 Peripheral vascular disease, unspecified: Secondary | ICD-10-CM | POA: Diagnosis not present

## 2022-12-15 DIAGNOSIS — I130 Hypertensive heart and chronic kidney disease with heart failure and stage 1 through stage 4 chronic kidney disease, or unspecified chronic kidney disease: Secondary | ICD-10-CM | POA: Diagnosis not present

## 2022-12-15 DIAGNOSIS — N1832 Chronic kidney disease, stage 3b: Secondary | ICD-10-CM | POA: Diagnosis not present

## 2022-12-15 DIAGNOSIS — R278 Other lack of coordination: Secondary | ICD-10-CM | POA: Diagnosis not present

## 2022-12-15 DIAGNOSIS — M6281 Muscle weakness (generalized): Secondary | ICD-10-CM | POA: Diagnosis not present

## 2022-12-15 DIAGNOSIS — R319 Hematuria, unspecified: Secondary | ICD-10-CM | POA: Diagnosis not present

## 2022-12-15 DIAGNOSIS — I5033 Acute on chronic diastolic (congestive) heart failure: Secondary | ICD-10-CM | POA: Diagnosis not present

## 2022-12-15 DIAGNOSIS — I4892 Unspecified atrial flutter: Secondary | ICD-10-CM | POA: Diagnosis not present

## 2022-12-15 DIAGNOSIS — I4891 Unspecified atrial fibrillation: Secondary | ICD-10-CM | POA: Diagnosis not present

## 2022-12-16 DIAGNOSIS — R278 Other lack of coordination: Secondary | ICD-10-CM | POA: Diagnosis not present

## 2022-12-16 DIAGNOSIS — I639 Cerebral infarction, unspecified: Secondary | ICD-10-CM | POA: Diagnosis not present

## 2022-12-16 DIAGNOSIS — R7989 Other specified abnormal findings of blood chemistry: Secondary | ICD-10-CM | POA: Diagnosis not present

## 2022-12-16 DIAGNOSIS — N390 Urinary tract infection, site not specified: Secondary | ICD-10-CM | POA: Diagnosis not present

## 2022-12-16 DIAGNOSIS — M6281 Muscle weakness (generalized): Secondary | ICD-10-CM | POA: Diagnosis not present

## 2022-12-16 DIAGNOSIS — R2689 Other abnormalities of gait and mobility: Secondary | ICD-10-CM | POA: Diagnosis not present

## 2022-12-16 DIAGNOSIS — I4892 Unspecified atrial flutter: Secondary | ICD-10-CM | POA: Diagnosis not present

## 2022-12-17 DIAGNOSIS — I4892 Unspecified atrial flutter: Secondary | ICD-10-CM | POA: Diagnosis not present

## 2022-12-17 DIAGNOSIS — M6281 Muscle weakness (generalized): Secondary | ICD-10-CM | POA: Diagnosis not present

## 2022-12-17 DIAGNOSIS — I639 Cerebral infarction, unspecified: Secondary | ICD-10-CM | POA: Diagnosis not present

## 2022-12-17 DIAGNOSIS — R278 Other lack of coordination: Secondary | ICD-10-CM | POA: Diagnosis not present

## 2022-12-17 DIAGNOSIS — R2689 Other abnormalities of gait and mobility: Secondary | ICD-10-CM | POA: Diagnosis not present

## 2022-12-20 DIAGNOSIS — I4892 Unspecified atrial flutter: Secondary | ICD-10-CM | POA: Diagnosis not present

## 2022-12-20 DIAGNOSIS — R278 Other lack of coordination: Secondary | ICD-10-CM | POA: Diagnosis not present

## 2022-12-20 DIAGNOSIS — R1312 Dysphagia, oropharyngeal phase: Secondary | ICD-10-CM | POA: Diagnosis not present

## 2022-12-20 DIAGNOSIS — M6281 Muscle weakness (generalized): Secondary | ICD-10-CM | POA: Diagnosis not present

## 2022-12-20 DIAGNOSIS — R2689 Other abnormalities of gait and mobility: Secondary | ICD-10-CM | POA: Diagnosis not present

## 2022-12-20 DIAGNOSIS — I639 Cerebral infarction, unspecified: Secondary | ICD-10-CM | POA: Diagnosis not present

## 2022-12-21 DIAGNOSIS — I639 Cerebral infarction, unspecified: Secondary | ICD-10-CM | POA: Diagnosis not present

## 2022-12-21 DIAGNOSIS — M6281 Muscle weakness (generalized): Secondary | ICD-10-CM | POA: Diagnosis not present

## 2022-12-21 DIAGNOSIS — R1312 Dysphagia, oropharyngeal phase: Secondary | ICD-10-CM | POA: Diagnosis not present

## 2022-12-21 DIAGNOSIS — R278 Other lack of coordination: Secondary | ICD-10-CM | POA: Diagnosis not present

## 2022-12-21 DIAGNOSIS — I4892 Unspecified atrial flutter: Secondary | ICD-10-CM | POA: Diagnosis not present

## 2022-12-21 DIAGNOSIS — R2689 Other abnormalities of gait and mobility: Secondary | ICD-10-CM | POA: Diagnosis not present

## 2022-12-23 DIAGNOSIS — R1312 Dysphagia, oropharyngeal phase: Secondary | ICD-10-CM | POA: Diagnosis not present

## 2022-12-23 DIAGNOSIS — Z515 Encounter for palliative care: Secondary | ICD-10-CM | POA: Diagnosis not present

## 2022-12-23 DIAGNOSIS — M6281 Muscle weakness (generalized): Secondary | ICD-10-CM | POA: Diagnosis not present

## 2022-12-23 DIAGNOSIS — I4892 Unspecified atrial flutter: Secondary | ICD-10-CM | POA: Diagnosis not present

## 2022-12-23 DIAGNOSIS — R278 Other lack of coordination: Secondary | ICD-10-CM | POA: Diagnosis not present

## 2022-12-23 DIAGNOSIS — I639 Cerebral infarction, unspecified: Secondary | ICD-10-CM | POA: Diagnosis not present

## 2022-12-23 DIAGNOSIS — R2689 Other abnormalities of gait and mobility: Secondary | ICD-10-CM | POA: Diagnosis not present

## 2022-12-24 DIAGNOSIS — I4892 Unspecified atrial flutter: Secondary | ICD-10-CM | POA: Diagnosis not present

## 2022-12-24 DIAGNOSIS — R2689 Other abnormalities of gait and mobility: Secondary | ICD-10-CM | POA: Diagnosis not present

## 2022-12-24 DIAGNOSIS — I639 Cerebral infarction, unspecified: Secondary | ICD-10-CM | POA: Diagnosis not present

## 2022-12-24 DIAGNOSIS — R1312 Dysphagia, oropharyngeal phase: Secondary | ICD-10-CM | POA: Diagnosis not present

## 2022-12-24 DIAGNOSIS — M6281 Muscle weakness (generalized): Secondary | ICD-10-CM | POA: Diagnosis not present

## 2022-12-24 DIAGNOSIS — R278 Other lack of coordination: Secondary | ICD-10-CM | POA: Diagnosis not present

## 2022-12-25 DIAGNOSIS — D649 Anemia, unspecified: Secondary | ICD-10-CM | POA: Diagnosis not present

## 2022-12-26 DIAGNOSIS — I4892 Unspecified atrial flutter: Secondary | ICD-10-CM | POA: Diagnosis not present

## 2022-12-26 DIAGNOSIS — R278 Other lack of coordination: Secondary | ICD-10-CM | POA: Diagnosis not present

## 2022-12-26 DIAGNOSIS — M6281 Muscle weakness (generalized): Secondary | ICD-10-CM | POA: Diagnosis not present

## 2022-12-26 DIAGNOSIS — R1312 Dysphagia, oropharyngeal phase: Secondary | ICD-10-CM | POA: Diagnosis not present

## 2022-12-26 DIAGNOSIS — R2689 Other abnormalities of gait and mobility: Secondary | ICD-10-CM | POA: Diagnosis not present

## 2022-12-26 DIAGNOSIS — I639 Cerebral infarction, unspecified: Secondary | ICD-10-CM | POA: Diagnosis not present

## 2022-12-27 DIAGNOSIS — R2689 Other abnormalities of gait and mobility: Secondary | ICD-10-CM | POA: Diagnosis not present

## 2022-12-27 DIAGNOSIS — M6281 Muscle weakness (generalized): Secondary | ICD-10-CM | POA: Diagnosis not present

## 2022-12-27 DIAGNOSIS — I639 Cerebral infarction, unspecified: Secondary | ICD-10-CM | POA: Diagnosis not present

## 2022-12-27 DIAGNOSIS — R278 Other lack of coordination: Secondary | ICD-10-CM | POA: Diagnosis not present

## 2022-12-27 DIAGNOSIS — I4892 Unspecified atrial flutter: Secondary | ICD-10-CM | POA: Diagnosis not present

## 2022-12-27 DIAGNOSIS — R1312 Dysphagia, oropharyngeal phase: Secondary | ICD-10-CM | POA: Diagnosis not present

## 2022-12-28 DIAGNOSIS — I509 Heart failure, unspecified: Secondary | ICD-10-CM | POA: Diagnosis not present

## 2022-12-28 DIAGNOSIS — I739 Peripheral vascular disease, unspecified: Secondary | ICD-10-CM | POA: Diagnosis not present

## 2022-12-28 DIAGNOSIS — M6281 Muscle weakness (generalized): Secondary | ICD-10-CM | POA: Diagnosis not present

## 2022-12-28 DIAGNOSIS — R2689 Other abnormalities of gait and mobility: Secondary | ICD-10-CM | POA: Diagnosis not present

## 2022-12-28 DIAGNOSIS — I4891 Unspecified atrial fibrillation: Secondary | ICD-10-CM | POA: Diagnosis not present

## 2022-12-28 DIAGNOSIS — R278 Other lack of coordination: Secondary | ICD-10-CM | POA: Diagnosis not present

## 2022-12-28 DIAGNOSIS — N1832 Chronic kidney disease, stage 3b: Secondary | ICD-10-CM | POA: Diagnosis not present

## 2022-12-28 DIAGNOSIS — R1312 Dysphagia, oropharyngeal phase: Secondary | ICD-10-CM | POA: Diagnosis not present

## 2022-12-28 DIAGNOSIS — I639 Cerebral infarction, unspecified: Secondary | ICD-10-CM | POA: Diagnosis not present

## 2022-12-28 DIAGNOSIS — Z89512 Acquired absence of left leg below knee: Secondary | ICD-10-CM | POA: Diagnosis not present

## 2022-12-28 DIAGNOSIS — I130 Hypertensive heart and chronic kidney disease with heart failure and stage 1 through stage 4 chronic kidney disease, or unspecified chronic kidney disease: Secondary | ICD-10-CM | POA: Diagnosis not present

## 2022-12-28 DIAGNOSIS — I10 Essential (primary) hypertension: Secondary | ICD-10-CM | POA: Diagnosis not present

## 2022-12-28 DIAGNOSIS — I4892 Unspecified atrial flutter: Secondary | ICD-10-CM | POA: Diagnosis not present

## 2022-12-29 DIAGNOSIS — R2689 Other abnormalities of gait and mobility: Secondary | ICD-10-CM | POA: Diagnosis not present

## 2022-12-29 DIAGNOSIS — M6281 Muscle weakness (generalized): Secondary | ICD-10-CM | POA: Diagnosis not present

## 2022-12-29 DIAGNOSIS — R278 Other lack of coordination: Secondary | ICD-10-CM | POA: Diagnosis not present

## 2022-12-29 DIAGNOSIS — I639 Cerebral infarction, unspecified: Secondary | ICD-10-CM | POA: Diagnosis not present

## 2022-12-29 DIAGNOSIS — R1312 Dysphagia, oropharyngeal phase: Secondary | ICD-10-CM | POA: Diagnosis not present

## 2022-12-29 DIAGNOSIS — I4892 Unspecified atrial flutter: Secondary | ICD-10-CM | POA: Diagnosis not present

## 2022-12-30 DIAGNOSIS — R2689 Other abnormalities of gait and mobility: Secondary | ICD-10-CM | POA: Diagnosis not present

## 2022-12-30 DIAGNOSIS — I130 Hypertensive heart and chronic kidney disease with heart failure and stage 1 through stage 4 chronic kidney disease, or unspecified chronic kidney disease: Secondary | ICD-10-CM | POA: Diagnosis not present

## 2022-12-30 DIAGNOSIS — R1312 Dysphagia, oropharyngeal phase: Secondary | ICD-10-CM | POA: Diagnosis not present

## 2022-12-30 DIAGNOSIS — I4891 Unspecified atrial fibrillation: Secondary | ICD-10-CM | POA: Diagnosis not present

## 2022-12-30 DIAGNOSIS — I739 Peripheral vascular disease, unspecified: Secondary | ICD-10-CM | POA: Diagnosis not present

## 2022-12-30 DIAGNOSIS — N1832 Chronic kidney disease, stage 3b: Secondary | ICD-10-CM | POA: Diagnosis not present

## 2022-12-30 DIAGNOSIS — I639 Cerebral infarction, unspecified: Secondary | ICD-10-CM | POA: Diagnosis not present

## 2022-12-30 DIAGNOSIS — M6281 Muscle weakness (generalized): Secondary | ICD-10-CM | POA: Diagnosis not present

## 2022-12-30 DIAGNOSIS — R319 Hematuria, unspecified: Secondary | ICD-10-CM | POA: Diagnosis not present

## 2022-12-30 DIAGNOSIS — R278 Other lack of coordination: Secondary | ICD-10-CM | POA: Diagnosis not present

## 2022-12-30 DIAGNOSIS — I10 Essential (primary) hypertension: Secondary | ICD-10-CM | POA: Diagnosis not present

## 2022-12-30 DIAGNOSIS — I4892 Unspecified atrial flutter: Secondary | ICD-10-CM | POA: Diagnosis not present

## 2022-12-30 DIAGNOSIS — E1122 Type 2 diabetes mellitus with diabetic chronic kidney disease: Secondary | ICD-10-CM | POA: Diagnosis not present

## 2022-12-30 DIAGNOSIS — I5033 Acute on chronic diastolic (congestive) heart failure: Secondary | ICD-10-CM | POA: Diagnosis not present

## 2022-12-31 DIAGNOSIS — I639 Cerebral infarction, unspecified: Secondary | ICD-10-CM | POA: Diagnosis not present

## 2022-12-31 DIAGNOSIS — R278 Other lack of coordination: Secondary | ICD-10-CM | POA: Diagnosis not present

## 2022-12-31 DIAGNOSIS — R1312 Dysphagia, oropharyngeal phase: Secondary | ICD-10-CM | POA: Diagnosis not present

## 2022-12-31 DIAGNOSIS — R2689 Other abnormalities of gait and mobility: Secondary | ICD-10-CM | POA: Diagnosis not present

## 2022-12-31 DIAGNOSIS — M6281 Muscle weakness (generalized): Secondary | ICD-10-CM | POA: Diagnosis not present

## 2022-12-31 DIAGNOSIS — I4892 Unspecified atrial flutter: Secondary | ICD-10-CM | POA: Diagnosis not present

## 2023-01-01 DIAGNOSIS — I4892 Unspecified atrial flutter: Secondary | ICD-10-CM | POA: Diagnosis not present

## 2023-01-01 DIAGNOSIS — R2689 Other abnormalities of gait and mobility: Secondary | ICD-10-CM | POA: Diagnosis not present

## 2023-01-01 DIAGNOSIS — R278 Other lack of coordination: Secondary | ICD-10-CM | POA: Diagnosis not present

## 2023-01-01 DIAGNOSIS — R1312 Dysphagia, oropharyngeal phase: Secondary | ICD-10-CM | POA: Diagnosis not present

## 2023-01-01 DIAGNOSIS — M6281 Muscle weakness (generalized): Secondary | ICD-10-CM | POA: Diagnosis not present

## 2023-01-01 DIAGNOSIS — I639 Cerebral infarction, unspecified: Secondary | ICD-10-CM | POA: Diagnosis not present

## 2023-01-02 DIAGNOSIS — R2689 Other abnormalities of gait and mobility: Secondary | ICD-10-CM | POA: Diagnosis not present

## 2023-01-02 DIAGNOSIS — R1312 Dysphagia, oropharyngeal phase: Secondary | ICD-10-CM | POA: Diagnosis not present

## 2023-01-02 DIAGNOSIS — R278 Other lack of coordination: Secondary | ICD-10-CM | POA: Diagnosis not present

## 2023-01-02 DIAGNOSIS — M6281 Muscle weakness (generalized): Secondary | ICD-10-CM | POA: Diagnosis not present

## 2023-01-02 DIAGNOSIS — I4892 Unspecified atrial flutter: Secondary | ICD-10-CM | POA: Diagnosis not present

## 2023-01-02 DIAGNOSIS — I639 Cerebral infarction, unspecified: Secondary | ICD-10-CM | POA: Diagnosis not present

## 2023-01-03 DIAGNOSIS — R278 Other lack of coordination: Secondary | ICD-10-CM | POA: Diagnosis not present

## 2023-01-03 DIAGNOSIS — M6281 Muscle weakness (generalized): Secondary | ICD-10-CM | POA: Diagnosis not present

## 2023-01-03 DIAGNOSIS — R2689 Other abnormalities of gait and mobility: Secondary | ICD-10-CM | POA: Diagnosis not present

## 2023-01-03 DIAGNOSIS — R1312 Dysphagia, oropharyngeal phase: Secondary | ICD-10-CM | POA: Diagnosis not present

## 2023-01-03 DIAGNOSIS — I4892 Unspecified atrial flutter: Secondary | ICD-10-CM | POA: Diagnosis not present

## 2023-01-03 DIAGNOSIS — I639 Cerebral infarction, unspecified: Secondary | ICD-10-CM | POA: Diagnosis not present

## 2023-01-04 DIAGNOSIS — R2689 Other abnormalities of gait and mobility: Secondary | ICD-10-CM | POA: Diagnosis not present

## 2023-01-04 DIAGNOSIS — R1312 Dysphagia, oropharyngeal phase: Secondary | ICD-10-CM | POA: Diagnosis not present

## 2023-01-04 DIAGNOSIS — R278 Other lack of coordination: Secondary | ICD-10-CM | POA: Diagnosis not present

## 2023-01-04 DIAGNOSIS — I4892 Unspecified atrial flutter: Secondary | ICD-10-CM | POA: Diagnosis not present

## 2023-01-04 DIAGNOSIS — I639 Cerebral infarction, unspecified: Secondary | ICD-10-CM | POA: Diagnosis not present

## 2023-01-04 DIAGNOSIS — M6281 Muscle weakness (generalized): Secondary | ICD-10-CM | POA: Diagnosis not present

## 2023-01-05 DIAGNOSIS — R1312 Dysphagia, oropharyngeal phase: Secondary | ICD-10-CM | POA: Diagnosis not present

## 2023-01-05 DIAGNOSIS — I639 Cerebral infarction, unspecified: Secondary | ICD-10-CM | POA: Diagnosis not present

## 2023-01-05 DIAGNOSIS — R2689 Other abnormalities of gait and mobility: Secondary | ICD-10-CM | POA: Diagnosis not present

## 2023-01-05 DIAGNOSIS — R278 Other lack of coordination: Secondary | ICD-10-CM | POA: Diagnosis not present

## 2023-01-05 DIAGNOSIS — I4892 Unspecified atrial flutter: Secondary | ICD-10-CM | POA: Diagnosis not present

## 2023-01-05 DIAGNOSIS — M6281 Muscle weakness (generalized): Secondary | ICD-10-CM | POA: Diagnosis not present

## 2023-01-05 DIAGNOSIS — I5033 Acute on chronic diastolic (congestive) heart failure: Secondary | ICD-10-CM | POA: Diagnosis not present

## 2023-01-06 DIAGNOSIS — R2689 Other abnormalities of gait and mobility: Secondary | ICD-10-CM | POA: Diagnosis not present

## 2023-01-06 DIAGNOSIS — M6281 Muscle weakness (generalized): Secondary | ICD-10-CM | POA: Diagnosis not present

## 2023-01-06 DIAGNOSIS — R1312 Dysphagia, oropharyngeal phase: Secondary | ICD-10-CM | POA: Diagnosis not present

## 2023-01-06 DIAGNOSIS — R278 Other lack of coordination: Secondary | ICD-10-CM | POA: Diagnosis not present

## 2023-01-06 DIAGNOSIS — I4892 Unspecified atrial flutter: Secondary | ICD-10-CM | POA: Diagnosis not present

## 2023-01-06 DIAGNOSIS — I639 Cerebral infarction, unspecified: Secondary | ICD-10-CM | POA: Diagnosis not present

## 2023-01-06 DIAGNOSIS — I509 Heart failure, unspecified: Secondary | ICD-10-CM | POA: Diagnosis not present

## 2023-01-07 DIAGNOSIS — M6281 Muscle weakness (generalized): Secondary | ICD-10-CM | POA: Diagnosis not present

## 2023-01-07 DIAGNOSIS — R278 Other lack of coordination: Secondary | ICD-10-CM | POA: Diagnosis not present

## 2023-01-07 DIAGNOSIS — R2689 Other abnormalities of gait and mobility: Secondary | ICD-10-CM | POA: Diagnosis not present

## 2023-01-07 DIAGNOSIS — I4892 Unspecified atrial flutter: Secondary | ICD-10-CM | POA: Diagnosis not present

## 2023-01-07 DIAGNOSIS — I639 Cerebral infarction, unspecified: Secondary | ICD-10-CM | POA: Diagnosis not present

## 2023-01-07 DIAGNOSIS — R1312 Dysphagia, oropharyngeal phase: Secondary | ICD-10-CM | POA: Diagnosis not present

## 2023-01-09 ENCOUNTER — Emergency Department (HOSPITAL_COMMUNITY): Payer: Medicare Other

## 2023-01-09 ENCOUNTER — Other Ambulatory Visit: Payer: Self-pay

## 2023-01-09 ENCOUNTER — Inpatient Hospital Stay (HOSPITAL_COMMUNITY)
Admission: EM | Admit: 2023-01-09 | Discharge: 2023-01-11 | DRG: 065 | Disposition: A | Discharge status: Skilled Nursing Facility | Payer: Medicare Other | Source: Skilled Nursing Facility | Attending: Student in an Organized Health Care Education/Training Program | Admitting: Student in an Organized Health Care Education/Training Program

## 2023-01-09 DIAGNOSIS — Z7401 Bed confinement status: Secondary | ICD-10-CM

## 2023-01-09 DIAGNOSIS — Z66 Do not resuscitate: Secondary | ICD-10-CM | POA: Diagnosis not present

## 2023-01-09 DIAGNOSIS — I4891 Unspecified atrial fibrillation: Secondary | ICD-10-CM | POA: Diagnosis not present

## 2023-01-09 DIAGNOSIS — D631 Anemia in chronic kidney disease: Secondary | ICD-10-CM | POA: Diagnosis present

## 2023-01-09 DIAGNOSIS — I639 Cerebral infarction, unspecified: Secondary | ICD-10-CM | POA: Diagnosis not present

## 2023-01-09 DIAGNOSIS — I4892 Unspecified atrial flutter: Secondary | ICD-10-CM | POA: Diagnosis not present

## 2023-01-09 DIAGNOSIS — R531 Weakness: Secondary | ICD-10-CM | POA: Diagnosis not present

## 2023-01-09 DIAGNOSIS — M19041 Primary osteoarthritis, right hand: Secondary | ICD-10-CM | POA: Diagnosis not present

## 2023-01-09 DIAGNOSIS — R0989 Other specified symptoms and signs involving the circulatory and respiratory systems: Secondary | ICD-10-CM | POA: Diagnosis not present

## 2023-01-09 DIAGNOSIS — Z89512 Acquired absence of left leg below knee: Secondary | ICD-10-CM | POA: Diagnosis not present

## 2023-01-09 DIAGNOSIS — D649 Anemia, unspecified: Secondary | ICD-10-CM | POA: Diagnosis not present

## 2023-01-09 DIAGNOSIS — I69322 Dysarthria following cerebral infarction: Secondary | ICD-10-CM | POA: Diagnosis not present

## 2023-01-09 DIAGNOSIS — I6340 Cerebral infarction due to embolism of unspecified cerebral artery: Principal | ICD-10-CM | POA: Diagnosis present

## 2023-01-09 DIAGNOSIS — E782 Mixed hyperlipidemia: Secondary | ICD-10-CM

## 2023-01-09 DIAGNOSIS — N179 Acute kidney failure, unspecified: Secondary | ICD-10-CM | POA: Diagnosis present

## 2023-01-09 DIAGNOSIS — R29707 NIHSS score 7: Secondary | ICD-10-CM | POA: Diagnosis present

## 2023-01-09 DIAGNOSIS — Z743 Need for continuous supervision: Secondary | ICD-10-CM | POA: Diagnosis not present

## 2023-01-09 DIAGNOSIS — Z7901 Long term (current) use of anticoagulants: Secondary | ICD-10-CM

## 2023-01-09 DIAGNOSIS — I69321 Dysphasia following cerebral infarction: Secondary | ICD-10-CM | POA: Diagnosis not present

## 2023-01-09 DIAGNOSIS — Z90710 Acquired absence of both cervix and uterus: Secondary | ICD-10-CM

## 2023-01-09 DIAGNOSIS — R29810 Facial weakness: Secondary | ICD-10-CM | POA: Diagnosis present

## 2023-01-09 DIAGNOSIS — Z961 Presence of intraocular lens: Secondary | ICD-10-CM | POA: Diagnosis present

## 2023-01-09 DIAGNOSIS — R29818 Other symptoms and signs involving the nervous system: Secondary | ICD-10-CM | POA: Diagnosis not present

## 2023-01-09 DIAGNOSIS — E1165 Type 2 diabetes mellitus with hyperglycemia: Secondary | ICD-10-CM | POA: Diagnosis not present

## 2023-01-09 DIAGNOSIS — M19042 Primary osteoarthritis, left hand: Secondary | ICD-10-CM | POA: Diagnosis not present

## 2023-01-09 DIAGNOSIS — I499 Cardiac arrhythmia, unspecified: Secondary | ICD-10-CM | POA: Diagnosis not present

## 2023-01-09 DIAGNOSIS — I739 Peripheral vascular disease, unspecified: Secondary | ICD-10-CM | POA: Diagnosis present

## 2023-01-09 DIAGNOSIS — I69351 Hemiplegia and hemiparesis following cerebral infarction affecting right dominant side: Secondary | ICD-10-CM

## 2023-01-09 DIAGNOSIS — Z8673 Personal history of transient ischemic attack (TIA), and cerebral infarction without residual deficits: Secondary | ICD-10-CM

## 2023-01-09 DIAGNOSIS — I130 Hypertensive heart and chronic kidney disease with heart failure and stage 1 through stage 4 chronic kidney disease, or unspecified chronic kidney disease: Secondary | ICD-10-CM | POA: Diagnosis not present

## 2023-01-09 DIAGNOSIS — I483 Typical atrial flutter: Secondary | ICD-10-CM | POA: Diagnosis not present

## 2023-01-09 DIAGNOSIS — Z79899 Other long term (current) drug therapy: Secondary | ICD-10-CM

## 2023-01-09 DIAGNOSIS — E1122 Type 2 diabetes mellitus with diabetic chronic kidney disease: Secondary | ICD-10-CM | POA: Diagnosis not present

## 2023-01-09 DIAGNOSIS — G546 Phantom limb syndrome with pain: Secondary | ICD-10-CM | POA: Diagnosis not present

## 2023-01-09 DIAGNOSIS — R404 Transient alteration of awareness: Secondary | ICD-10-CM | POA: Diagnosis not present

## 2023-01-09 DIAGNOSIS — E119 Type 2 diabetes mellitus without complications: Secondary | ICD-10-CM | POA: Diagnosis present

## 2023-01-09 DIAGNOSIS — H3530 Unspecified macular degeneration: Secondary | ICD-10-CM | POA: Diagnosis present

## 2023-01-09 DIAGNOSIS — I5032 Chronic diastolic (congestive) heart failure: Secondary | ICD-10-CM | POA: Diagnosis not present

## 2023-01-09 DIAGNOSIS — R739 Hyperglycemia, unspecified: Secondary | ICD-10-CM | POA: Diagnosis not present

## 2023-01-09 DIAGNOSIS — R0602 Shortness of breath: Secondary | ICD-10-CM | POA: Diagnosis not present

## 2023-01-09 DIAGNOSIS — Z853 Personal history of malignant neoplasm of breast: Secondary | ICD-10-CM

## 2023-01-09 DIAGNOSIS — E1151 Type 2 diabetes mellitus with diabetic peripheral angiopathy without gangrene: Secondary | ICD-10-CM | POA: Diagnosis present

## 2023-01-09 DIAGNOSIS — Z515 Encounter for palliative care: Secondary | ICD-10-CM

## 2023-01-09 DIAGNOSIS — Z6830 Body mass index (BMI) 30.0-30.9, adult: Secondary | ICD-10-CM | POA: Diagnosis not present

## 2023-01-09 DIAGNOSIS — L8990 Pressure ulcer of unspecified site, unspecified stage: Secondary | ICD-10-CM | POA: Insufficient documentation

## 2023-01-09 DIAGNOSIS — I10 Essential (primary) hypertension: Secondary | ICD-10-CM | POA: Diagnosis present

## 2023-01-09 DIAGNOSIS — N1832 Acute kidney failure, unspecified: Secondary | ICD-10-CM

## 2023-01-09 DIAGNOSIS — E785 Hyperlipidemia, unspecified: Secondary | ICD-10-CM | POA: Diagnosis not present

## 2023-01-09 DIAGNOSIS — E1169 Type 2 diabetes mellitus with other specified complication: Secondary | ICD-10-CM | POA: Diagnosis present

## 2023-01-09 DIAGNOSIS — E669 Obesity, unspecified: Secondary | ICD-10-CM | POA: Diagnosis present

## 2023-01-09 DIAGNOSIS — Z888 Allergy status to other drugs, medicaments and biological substances status: Secondary | ICD-10-CM

## 2023-01-09 DIAGNOSIS — I6381 Other cerebral infarction due to occlusion or stenosis of small artery: Secondary | ICD-10-CM | POA: Diagnosis not present

## 2023-01-09 DIAGNOSIS — I63512 Cerebral infarction due to unspecified occlusion or stenosis of left middle cerebral artery: Secondary | ICD-10-CM | POA: Diagnosis not present

## 2023-01-09 DIAGNOSIS — I517 Cardiomegaly: Secondary | ICD-10-CM | POA: Diagnosis not present

## 2023-01-09 LAB — COMPREHENSIVE METABOLIC PANEL
ALT: 15 U/L (ref 0–44)
AST: 16 U/L (ref 15–41)
Albumin: 2.7 g/dL — ABNORMAL LOW (ref 3.5–5.0)
Alkaline Phosphatase: 82 U/L (ref 38–126)
Anion gap: 11 (ref 5–15)
BUN: 22 mg/dL (ref 8–23)
CO2: 26 mmol/L (ref 22–32)
Calcium: 8.8 mg/dL — ABNORMAL LOW (ref 8.9–10.3)
Chloride: 102 mmol/L (ref 98–111)
Creatinine, Ser: 1.42 mg/dL — ABNORMAL HIGH (ref 0.44–1.00)
GFR, Estimated: 34 mL/min — ABNORMAL LOW (ref >60)
Glucose, Bld: 198 mg/dL — ABNORMAL HIGH (ref 70–99)
Potassium: 3.8 mmol/L (ref 3.5–5.1)
Sodium: 139 mmol/L (ref 135–145)
Total Bilirubin: 0.4 mg/dL (ref 0.3–1.2)
Total Protein: 5.2 g/dL — ABNORMAL LOW (ref 6.5–8.1)

## 2023-01-09 LAB — CBC WITH DIFFERENTIAL/PLATELET
Abs Immature Granulocytes: 0.04 10*3/uL (ref 0.00–0.07)
Basophils Absolute: 0 10*3/uL (ref 0.0–0.1)
Basophils Relative: 0 %
Eosinophils Absolute: 0.1 10*3/uL (ref 0.0–0.5)
Eosinophils Relative: 2 %
HCT: 30.9 % — ABNORMAL LOW (ref 36.0–46.0)
Hemoglobin: 9 g/dL — ABNORMAL LOW (ref 12.0–15.0)
Immature Granulocytes: 1 %
Lymphocytes Relative: 19 %
Lymphs Abs: 1.2 10*3/uL (ref 0.7–4.0)
MCH: 24.9 pg — ABNORMAL LOW (ref 26.0–34.0)
MCHC: 29.1 g/dL — ABNORMAL LOW (ref 30.0–36.0)
MCV: 85.6 fL (ref 80.0–100.0)
Monocytes Absolute: 0.7 10*3/uL (ref 0.1–1.0)
Monocytes Relative: 11 %
Neutro Abs: 4.2 10*3/uL (ref 1.7–7.7)
Neutrophils Relative %: 67 %
Platelets: 335 10*3/uL (ref 150–400)
RBC: 3.61 MIL/uL — ABNORMAL LOW (ref 3.87–5.11)
RDW: 15.1 % (ref 11.5–15.5)
WBC: 6.2 10*3/uL (ref 4.0–10.5)
nRBC: 0 % (ref 0.0–0.2)

## 2023-01-09 LAB — TROPONIN I (HIGH SENSITIVITY)
Troponin I (High Sensitivity): 7 ng/L (ref <18)
Troponin I (High Sensitivity): 11 ng/L (ref <18)

## 2023-01-09 LAB — CBG MONITORING, ED: Glucose-Capillary: 119 mg/dL — ABNORMAL HIGH (ref 70–99)

## 2023-01-09 MED ORDER — ACETAMINOPHEN 325 MG PO TABS: 650.0000 mg | ORAL_TABLET | ORAL | Status: DC | PRN

## 2023-01-09 MED ORDER — ACETAMINOPHEN 650 MG RE SUPP: 650.0000 mg | RECTAL | Status: DC | PRN

## 2023-01-09 MED ORDER — ACETAMINOPHEN 160 MG/5ML PO SOLN: 650.0000 mg | ORAL | Status: DC | PRN

## 2023-01-09 MED ORDER — STROKE: EARLY STAGES OF RECOVERY BOOK
Freq: Once | Status: AC
  Filled 2023-01-09: qty 1

## 2023-01-09 MED ORDER — INSULIN ASPART 100 UNIT/ML IJ SOLN
0.0000 [IU] | INTRAMUSCULAR | Status: DC
  Administered 2023-01-10: 3 [IU] via SUBCUTANEOUS

## 2023-01-09 MED ORDER — AMIODARONE HCL 200 MG PO TABS
200.0000 mg | ORAL_TABLET | Freq: Every day | ORAL | Status: DC
  Administered 2023-01-09 – 2023-01-11 (×3): 200 mg via ORAL
  Filled 2023-01-09 (×3): qty 1

## 2023-01-09 MED ORDER — APIXABAN 5 MG PO TABS
5.0000 mg | ORAL_TABLET | Freq: Two times a day (BID) | ORAL | Status: DC
  Administered 2023-01-09 – 2023-01-11 (×4): 5 mg via ORAL
  Filled 2023-01-09 (×4): qty 1

## 2023-01-09 MED ORDER — DILTIAZEM HCL ER COATED BEADS 180 MG PO CP24
360.0000 mg | ORAL_CAPSULE | Freq: Every day | ORAL | Status: DC
  Administered 2023-01-09 – 2023-01-11 (×3): 360 mg via ORAL
  Filled 2023-01-09 (×3): qty 2

## 2023-01-09 MED ORDER — SODIUM CHLORIDE 0.9 % IV SOLN: INTRAVENOUS | Status: DC

## 2023-01-09 NOTE — ED Provider Notes (Signed)
[facility] AT [facility] Provider Note   CSN: [id number] Arrival date & time: 01/09/23  1352     History  Chief Complaint  Patient presents with   Weakness    [name] Petersen is a 89 y.o. female.  Patient is a 89 year old female with a past medical history of CVA with right-sided deficits, A-fib on Eliquis, diabetes, CHF presenting to the emergency department with weakness and altered mental status.  The patient reports to me that she started to have worsening of her right sided weakness yesterday.  The patient's daughter is at bedside who reported that the nursing assistant reported this morning that the patient appeared frustrated like she was having more difficulty using her right arm than usual.  Her daughter states that when she visited her around noon she noted that she was only using her left arm to eat and do her activities.  She states that after eating the patient then had an episode where she stared off with a blank stare and was unable to talk and was not responding to them.  She states that this lasted a few seconds and then was followed by her being slow to respond.  She states that her speech sounds normal but is just taking longer to respond to questions now.  The patient denies any associated pain, she denies any recent fevers, nausea, vomiting or diarrhea.  She states she has been taking her medications as prescribed.  Her daughter reports at baseline that she is able to move her right ankle and foot but is unable to lift her right leg and that she was able to lift her right arm up to her head after her therapy but is still weak on the right compared to the left.  The history is provided by the patient, a relative and the EMS personnel.  Weakness      Home Medications Prior to Admission medications   Medication Sig Start Date End Date Taking? Authorizing Provider  acetaminophen (TYLENOL) 325 MG tablet Take 3 tablets (975 mg total) by mouth  at bedtime as needed for mild pain. 09/12/22   Johnson, Clanford L, MD  amiodarone (PACERONE) 200 MG tablet Twice a day for 3 weeks; then 200 mg daily. 11/23/22   Vassie Loll, MD  apixaban (ELIQUIS) 5 MG TABS tablet Take 1 tablet (5 mg total) by mouth 2 (two) times daily. 09/12/22   Johnson, Clanford L, MD  BETA CAROTENE PO Take 1 tablet by mouth daily.    [provider]  Calcium Carbonate Antacid (TUMS PO) Take 2 tablets by mouth every 6 (six) hours as needed (acid reflux).    [provider]  Cholecalciferol (VITAMIN D3) 50 MCG (2000 UT) TABS Take 2,000 Units by mouth daily.    [provider]  cyanocobalamin 1000 MCG tablet Take 1,000 mcg by mouth daily.    [provider]  diltiazem (CARDIZEM CD) 360 MG 24 hr capsule Take 1 capsule (360 mg total) by mouth daily. 11/24/22   Vassie Loll, MD  furosemide (LASIX) 20 MG tablet Take 20 mg by mouth daily.    [provider]  gabapentin (NEURONTIN) 100 MG capsule Take 1 capsule (100 mg total) by mouth 2 (two) times daily. 11/23/22   Vassie Loll, MD  hydrocortisone (ANUSOL-HC) 2.5 % rectal cream Place 1 Application rectally 2 (two) times daily.    [provider]  lovastatin (MEVACOR) 20 MG tablet Take 20 mg by mouth daily at 6  PM. 11/30/18   [provider]  Menthol, Topical Analgesic, (BIOFREEZE) 4 % GEL Apply topically as needed.    [provider]  Multiple Vitamins-Minerals (I-VITE) TABS Take 1 tablet by mouth daily.    [provider]  nystatin ointment (MYCOSTATIN) Apply 1 Application topically 2 (two) times daily. 09/07/22   Aida Raider, NP  pantoprazole (PROTONIX) 40 MG tablet Take 1 tablet (40 mg total) by mouth daily. 11/24/22   Vassie Loll, MD  rOPINIRole (REQUIP) 0.25 MG tablet Take 1 tablet (0.25 mg total) by mouth 3 (three) times daily. 11/23/22   Vassie Loll, MD  sennosides-docusate sodium (SENOKOT-S) 8.6-50 MG tablet Take 1 tablet by mouth See admin  instructions. 1 tablet at bedtime every other day for constipation    [provider]      Allergies    Prednisone and Scopace [scopolamine]    Review of Systems   Review of Systems  Neurological:  Positive for weakness.    Physical Exam Updated Vital Signs BP 120/85 (BP Location: Left Arm)   Pulse 66   Temp 98.4 F (36.9 C) (Oral)   Resp 20   Ht 5\' 2"  (1.575 m)   Wt 75.8 kg   SpO2 100%   BMI 30.54 kg/m  Physical Exam Vitals and nursing note reviewed.  Constitutional:      General: She is not in acute distress.    Appearance: Normal appearance.  HENT:     Head: Normocephalic and atraumatic.     Nose: Nose normal.     Mouth/Throat:     Mouth: Mucous membranes are moist.     Pharynx: Oropharynx is clear.  Eyes:     Extraocular Movements: Extraocular movements intact.     Conjunctiva/sclera: Conjunctivae normal.     Pupils: Pupils are equal, round, and reactive to light.  Cardiovascular:     Rate and Rhythm: Normal rate and regular rhythm.     Heart sounds: Normal heart sounds.  Pulmonary:     Breath sounds: Normal breath sounds.     Comments: Mildly tachypneic, at baseline per daughter Abdominal:     General: Abdomen is flat.     Palpations: Abdomen is soft.     Tenderness: There is no abdominal tenderness.  Musculoskeletal:        General: Normal range of motion.     Cervical back: Normal range of motion.     Comments: LLE BKA  Skin:    General: Skin is warm and dry.  Neurological:     Mental Status: She is alert and oriented to person, place, and time.     Comments: Mild R-sided facial droop Clear speech 1/5 strength in RUE and RLE, 5/5 strength with no drift in LUE and LLE, sensation intact in all 4 extremities  Psychiatric:        Mood and Affect: Mood normal.        Behavior: Behavior normal.     ED Results / Procedures / Treatments   Labs (all labs ordered are listed, but only abnormal results are displayed) Labs Reviewed   COMPREHENSIVE METABOLIC PANEL  CBC WITH DIFFERENTIAL/PLATELET  URINALYSIS, ROUTINE W REFLEX MICROSCOPIC  TROPONIN I (HIGH SENSITIVITY)    EKG EKG Interpretation Date/Time:  Sunday January 09 2023 13:58:41 EDT Ventricular Rate:  61 PR Interval:    QRS Duration:  88 QT Interval:  429 QTC Calculation: 433 R Axis:   43  Text Interpretation: Atrial flutter with varied AV block, Low voltage,  precordial leads Borderline T wave abnormalities No significant change since last tracing Confirmed by Elayne Snare ([id number]) on 01/09/2023 3:11:29 PM  Radiology CT Head Wo Contrast  Result Date: 01/09/2023 CLINICAL DATA:  Neurologic deficit. EXAM: CT HEAD WITHOUT CONTRAST TECHNIQUE: Contiguous axial images were obtained from the base of the skull through the vertex without intravenous contrast. RADIATION DOSE REDUCTION: This exam was performed according to the departmental dose-optimization program which includes automated exposure control, adjustment of the mA and/or kV according to patient size and/or use of iterative reconstruction technique. COMPARISON:  CT 11/18/2022, MRI 11/19/2022 FINDINGS: Brain: Ventricles, cisterns and other CSF spaces are within normal relative patient's age. There is moderate chronic ischemic microvascular disease. Old high left frontal parietal infarct as seen on previous MRI. Small lacunar infarcts of the basal ganglia bilaterally. No mass, mass effect, shift of midline structures or acute hemorrhage. No evidence of acute infarction. Vascular: No hyperdense vessel or unexpected calcification. Skull: Normal. Negative for fracture or focal lesion. Sinuses/Orbits: No acute finding. Other: None. IMPRESSION: 1. No acute findings. 2. Moderate chronic ischemic microvascular disease. Old high left frontal parietal infarct. Small lacunar infarcts of the basal ganglia bilaterally. Electronically Signed   By: Elberta Fortis M.D.   On: 01/09/2023 14:58    Procedures Procedures     Medications Ordered in ED Medications - No data to display  ED Course/ Medical Decision Making/ A&P Clinical Course as of 01/09/23 1527  Sun Jan 09, 2023  1527 [facility] without acute abnormality. Will need MRI brain to evaluate for subacute stroke vs recrudescence of prior symptoms. Patient signed out to Dr. Lynelle Doctor pending MRI, labs and reassessment. [VK]    Clinical Course User Index [VK] Rexford Maus, DO                                 Medical Decision Making This patient presents to the ED with chief complaint(s) of weakness, AMS with pertinent past medical history of CVA w/ R-sided deficits, A fib on Eliquis, CHF, DM which further complicates the presenting complaint. The complaint involves an extensive differential diagnosis and also carries with it a high risk of complications and morbidity.    The differential diagnosis includes CVA, TIA, recrudescence of prior stroke, infection, hypo or hyperglycemia, electrolyte abnormality, ICH, mass effect  Additional history obtained: Additional history obtained from family and EMS  Records reviewed previous admission documents  ED Course and Reassessment: On patient's arrival she is hemodynamically stable in no acute distress.  Per patient and family she does have worsening right-sided deficits compared to her baseline however patient reports that her symptoms started yesterday so she is outside of TNK window and will undergo subacute stroke workup.  No other new deficits seen on exam.  Also concern for possible syncopal episode versus seizure.  She will have EKG, labs and head CT performed.  Likely will require MRI and neurology consultation and will continue to be closely monitored.  Independent labs interpretation:  -Pending   Independent visualization of imaging: - I independently visualized the following imaging with scope of interpretation limited to determining acute life threatening conditions related to emergency care: CTH,  which revealed no acute disease    Amount and/or Complexity of Data Reviewed Labs: ordered. Radiology: ordered.          Final Clinical Impression(s) / ED Diagnoses Final diagnoses:  None    Rx / DC Orders ED Discharge Orders  None         Rexford Maus, [place] 01/09/23 1527

## 2023-01-09 NOTE — Consult Note (Incomplete)
NEURO HOSPITALIST CONSULT NOTE   Requesting physician: Dr. Lynelle Doctor  Reason for Consult: Acute onset of worsened right sided weakness  History obtained from:  Patient and Chart     HPI:                                                                                                                                          [name] Petersen is an 89 y.o. female with a PMHx of stroke in August of this year with right sided deficits, atrial fibrillation (on Eliquis), arthritis, atherosclerotic PVD with ulceration of her right heel, breast cancer, chronic HFpEF, DM, HTN, left BKA and bilateral macular degeneration who presented to the [facility] ED via EMS on Sunday afternoon for evaluation of sudden onset global weakness with AMS. The worsening of her right sided weakness had started the day before, on Saturday. It was noted that the patient had pre-existing right sided weakness along with dysphasia from a prior stroke. Vitals per EMS: CBG 273, BP 139/78, atrial fibrillation on the monitor with HR 60-90, 97% on RA. On arrival to the ED, the patient denied any pain, but reported that her whole body felt "different". She was also complaining of dizziness.    The patient's daughter at bedside in the ED reported that the patient's nursing assistant reported on Sunday morning that the patient appeared frustrated like she was having more difficulty using her right arm than usual. Her daughter noted on visiting her around noon that the patient was only using her left arm to eat and do her activities. She stated that after eating the patient then had an episode where she stared off with a blank stare and was unable to talk and was not responding to them. This lasted a few seconds and then was followed by her being slow to respond. In the ED, daughter stated that her speech sounded normal but that she was just taking longer to respond to questions.   At baseline, the patient is able to move her right ankle  and foot but is unable to lift her right leg. She has been able to lift her right arm up to her head after since therapy but was still weak on the right compared to the left.   CT head revealed no acute findings. Moderate chronic ischemic microvascular disease, an old high left frontal parietal infarct and small bilateral lacunar infarcts of the basal ganglia were noted.  Past Medical History:  Diagnosis Date   A-fib [facility])    Arthritis    hands   Atherosclerotic PVD with ulceration (HCC) 07/15/2017   RIGHT HEEL   Breast cancer (HCC)    Cancer (HCC)    Chronic heart failure with preserved ejection fraction (HFpEF) (HCC)    Complication of anesthesia  lost eyesight for a few days after surgery in the 1950's   Constipation    Diabetes mellitus without complication (HCC)    no medications    Hypertension    Macular degeneration, bilateral    "one eye wet; one eye dry; she gets shots" (07/07/2017)   PAD (peripheral artery disease) (HCC)    Pneumonia    Stroke [facility])    some trouble swallowing    Past Surgical History:  Procedure Laterality Date   ABDOMINAL AORTOGRAM W/LOWER EXTREMITY N/A 06/16/2017   Procedure: ABDOMINAL AORTOGRAM W/LOWER EXTREMITY;  Surgeon: Fransisco Hertz, MD;  Location: [facility] INVASIVE CV LAB;  Service: Cardiovascular;  Laterality: N/A;  lt extermity   ABDOMINAL HYSTERECTOMY     AMPUTATION Left 07/06/2017   Procedure: AMPUTATION BELOW KNEE LEFT;  Surgeon: Larina Earthly, MD;  Location: [facility] OR;  Service: Vascular;  Laterality: Left;   CATARACT EXTRACTION W/ INTRAOCULAR LENS  IMPLANT, BILATERAL     DILATION AND CURETTAGE OF UTERUS     LOWER EXTREMITY ANGIOGRAPHY N/A 07/14/2017   Procedure: LOWER EXTREMITY ANGIOGRAPHY;  Surgeon: Chuck Hint, MD;  Location: [facility] INVASIVE CV LAB;  Service: Cardiovascular;  Laterality: N/A;    Family History  Adopted: Yes             Social History:  reports that she has never smoked. She has never used smokeless tobacco. She  reports that she does not drink alcohol and does not use drugs.  Allergies  Allergen Reactions   Prednisone Other (See Comments)    Fatigue   Scopace [Scopolamine] Other (See Comments)    Hallucinations    HOME MEDICATIONS:                                                                                                                      No current facility-administered medications on file prior to encounter.   Current Outpatient Medications on File Prior to Encounter  Medication Sig Dispense Refill   acetaminophen (TYLENOL) 325 MG tablet Take 3 tablets (975 mg total) by mouth at bedtime as needed for mild pain.     amiodarone (PACERONE) 200 MG tablet Twice a day for 3 weeks; then 200 mg daily.     apixaban (ELIQUIS) 5 MG TABS tablet Take 1 tablet (5 mg total) by mouth 2 (two) times daily. 60 tablet 2   BETA CAROTENE PO Take 1 tablet by mouth daily.     Calcium Carbonate Antacid (TUMS PO) Take 2 tablets by mouth every 6 (six) hours as needed (acid reflux).     Cholecalciferol (VITAMIN D3) 50 MCG (2000 UT) TABS Take 2,000 Units by mouth daily.     cyanocobalamin 1000 MCG tablet Take 1,000 mcg by mouth daily.     diltiazem (CARDIZEM CD) 360 MG 24 hr capsule Take 1 capsule (360 mg total) by mouth daily.     furosemide (LASIX) 20 MG tablet Take 20 mg by mouth daily.     gabapentin (NEURONTIN) 100 MG  capsule Take 1 capsule (100 mg total) by mouth 2 (two) times daily.     hydrocortisone (ANUSOL-HC) 2.5 % rectal cream Place 1 Application rectally 2 (two) times daily.     lovastatin (MEVACOR) 20 MG tablet Take 20 mg by mouth daily at 6 PM.     Menthol, Topical Analgesic, (BIOFREEZE) 4 % GEL Apply topically as needed.     Multiple Vitamins-Minerals (I-VITE) TABS Take 1 tablet by mouth daily.     nystatin ointment (MYCOSTATIN) Apply 1 Application topically 2 (two) times daily. 30 g 0   pantoprazole (PROTONIX) 40 MG tablet Take 1 tablet (40 mg total) by mouth daily.     rOPINIRole (REQUIP) 0.25  MG tablet Take 1 tablet (0.25 mg total) by mouth 3 (three) times daily.     sennosides-docusate sodium (SENOKOT-S) 8.6-50 MG tablet Take 1 tablet by mouth See admin instructions. 1 tablet at bedtime every other day for constipation       ROS:                                                                                                                                       As per HPI. She does not endorse any additional symptoms at the time of neurological assessment.    Blood pressure (!) 145/130, pulse (!) 112, temperature 98.4 F (36.9 C), temperature source Oral, resp. rate (!) 24, height 5\' 2"  (1.575 m), weight 75.8 kg, SpO2 97%.   General Examination:                                                                                                       Physical Exam  HEENT-  [place]/AT   Lungs- Respirations unlabored Extremities- No edema. Left BKA  Neurological Examination Mental Status: Alert, oriented x 5, thought content appropriate.  Speech fluent with intact naming and comprehension. Mild dysarthria. Able to follow all commands without difficulty. Cranial Nerves: II: Temporal visual fields intact with no extinction to DSS. PERRL  III,IV, VI: No ptosis. EOMI hesitancy on rightward gaze. Saccadic quality of visual pursuits noted.  V: Temp sensation equal bilaterally  VII: Smile symmetric VIII: Hearing intact to voice IX,X: No hoarseness XI: Right sided weakness XII: Midline tongue extension Motor: RUE 2/5 deltoid, 3/5 biceps and triceps, 3/5 grip RLE 0/5 proximally and distally LUE 4+/5 proximally and distally LLE 3/5 HF, 4-/5 knee extension and flexion Sensory: Temp and light touch subjectively intact in proximal limbs bilaterally. No extinction to DSS.  Deep Tendon Reflexes: Trace bilateral brachioradialis, biceps and patellar reflexes.  Cerebellar: No ataxia with FNF on the left. Unable to perform on the right  Gait: Unable to assess    Lab Results: Basic Metabolic  Panel: Recent Labs  Lab 01/09/23 1353  NA 139  K 3.8  CL 102  CO2 26  GLUCOSE 198*  BUN 22  CREATININE 1.42*  CALCIUM 8.8*    CBC: Recent Labs  Lab 01/09/23 1353  WBC 6.2  NEUTROABS 4.2  HGB 9.0*  HCT 30.9*  MCV 85.6  PLT 335    Cardiac Enzymes: No results for input(s): "CKTOTAL", "CKMB", "CKMBINDEX", "TROPONINI" in the last 168 hours.  Lipid Panel: No results for input(s): "CHOL", "TRIG", "HDL", "CHOLHDL", "VLDL", "LDLCALC" in the last 168 hours.  Imaging: MR Brain Wo Contrast (neuro protocol)  Result Date: 01/09/2023 CLINICAL DATA:  Neuro deficit, acute, stroke suspected. Weakness. Altered mental status. Decreased use of the right upper extremity EXAM: MRI HEAD WITHOUT CONTRAST TECHNIQUE: Multiplanar, multiecho pulse sequences of the brain and surrounding structures were obtained without intravenous contrast. COMPARISON:  CT head without contrast 01/09/2023. MR head without contrast 11/19/2022. FINDINGS: Brain: Acute nonhemorrhagic infarct is present in the high medial posterior left frontal lobe, adjacent to the previous infarct. T2 and FLAIR hyperintensities are associated. Moderate atrophy and diffuse white matter changes are otherwise stable. Remote lacunar infarct is present in the right corona radiata. Dilated perivascular spaces are present throughout the basal ganglia. The ventricles are proportionate to the degree of atrophy. No significant extraaxial fluid collection is present. The brainstem and cerebellum are within normal limits. Midline structures are within normal limits. Vascular: Flow is present in the major intracranial arteries. Skull and upper cervical spine: Mild degenerative anterolisthesis is present at C3-4 C4-5 and C5-6. Craniocervical junction is normal. Sinuses/Orbits: The paranasal sinuses and mastoid air cells are clear. Bilateral lens replacements are noted. Globes and orbits are otherwise unremarkable. IMPRESSION: 1. Acute nonhemorrhagic infarct  involving the high medial posterior left frontal lobe, adjacent to the previous infarct. 2. Moderate atrophy and diffuse white matter disease is otherwise stable. This likely reflects the sequela of chronic microvascular ischemia. Electronically Signed   By: Marin Roberts M.D.   On: 01/09/2023 17:21   DG Chest Port 1 View  Result Date: 01/09/2023 CLINICAL DATA:  Shortness of breath.  Weakness. EXAM: PORTABLE CHEST 1 VIEW COMPARISON:  One-view chest x-ray 11/17/2022 FINDINGS: The heart is enlarged. Atherosclerotic changes are present at the aortic arch. Lung volumes are low. No edema or effusion is present. The visualized soft tissues and bony thorax are unremarkable. IMPRESSION: 1. Cardiomegaly without failure. 2. Low lung volumes. Electronically Signed   By: Marin Roberts M.D.   On: 01/09/2023 15:49   CT Head Wo Contrast  Result Date: 01/09/2023 CLINICAL DATA:  Neurologic deficit. EXAM: CT HEAD WITHOUT CONTRAST TECHNIQUE: Contiguous axial images were obtained from the base of the skull through the vertex without intravenous contrast. RADIATION DOSE REDUCTION: This exam was performed according to the departmental dose-optimization program which includes automated exposure control, adjustment of the mA and/or kV according to patient size and/or use of iterative reconstruction technique. COMPARISON:  CT 11/18/2022, MRI 11/19/2022 FINDINGS: Brain: Ventricles, cisterns and other CSF spaces are within normal relative patient's age. There is moderate chronic ischemic microvascular disease. Old high left frontal parietal infarct as seen on previous MRI. Small lacunar infarcts of the basal ganglia bilaterally. No mass, mass effect, shift of midline structures or acute hemorrhage. No evidence  of acute infarction. Vascular: No hyperdense vessel or unexpected calcification. Skull: Normal. Negative for fracture or focal lesion. Sinuses/Orbits: No acute finding. Other: None. IMPRESSION: 1. No acute  findings. 2. Moderate chronic ischemic microvascular disease. Old high left frontal parietal infarct. Small lacunar infarcts of the basal ganglia bilaterally. Electronically Signed   By: Elberta Fortis M.D.   On: 01/09/2023 14:58     Assessment: 89 year old female with multiple stroke risk factors presenting with an early subacute left posterior frontal lobe ischemic infarction.  - Exam reveals right hemiplegia, dysarthria, mild right facial droop, hesitancy on rightward gaze and mildly increased latencies of motor and verbal responses.  - CT head: Moderate chronic ischemic microvascular disease, chronic high left frontal parietal infarct and small bilateral lacunar infarcts of the basal ganglia. - MRI brain: Acute nonhemorrhagic infarct involving the high medial posterior left frontal lobe, adjacent to the previous infarct. Moderate atrophy and prominent diffuse white matter disease is otherwise stable. This likely reflects the sequela of chronic microvascular ischemia. - MRA head (11/19/22): Very degraded by motion but still notable for advanced intracranial atherosclerosis. Advanced bilateral M1 stenosis with flow gap. Extensive atheromatous irregularity of medium size branches. No flow seen in the non dominant left vertebral artery until just before the basilar, likely retrograde.  - Carotid ultrasound (11/19/22): Minimal heterogeneous and calcified plaque, with no hemodynamically significant stenosis by duplex criteria in the extracranial cerebrovascular circulation. Left vertebral artery not visualized.  - TTE (09/11/22): Left ventricular ejection fraction, by estimation, is 60 to 65%. The left ventricle has normal function. The left ventricle has no regional wall motion abnormalities. There is mild left ventricular hypertrophy.  Left ventricular diastolic parameters are consistent with Grade II diastolic dysfunction (pseudonormalization).  Elevated left atrial pressure. No mural thrombus or valvular  vegetation mentioned in the report.  - EKG: Atrial flutter with varied AV block; Low voltage, precordial leads; Borderline T wave abnormalities - Anemic with significant drop in Hgb from 12.1 to 9.0 since August.  - Stroke risk factors: Prior stroke, advanced age, atrial fibrillation, PVD, history of cancer, CHF, DM and HTN  Recommendations: - Continue Eliquis. Risks of hemorrhagic transformation of her stroke while on anticoagulation are felt to be lower than the risk of ischemic stroke recurrence off Eliquis.  - Glycemic control - HgbA1c, fasting lipid panel - PT consult, OT consult, Speech consult - Long-term benefits of starting a statin are most likely outweighed by the risks given the patient's advanced age - Risk factor modification - Telemetry monitoring - Frequent neuro checks - Stool guaiac - Vitamin B12 and folate levels - NPO until passes stroke swallow screen   Electronically signed: Dr. Caryl Pina 01/09/2023, 7:13 PM

## 2023-01-09 NOTE — ED Notes (Signed)
X-ray at bedside

## 2023-01-09 NOTE — ED Notes (Signed)
LABS HAD NO COME BACK.  CALLED DOWN TO LAB.

## 2023-01-09 NOTE — ED Provider Notes (Signed)
Clinical Course as of 01/09/23 2026  Wynelle Link Jan 09, 2023  [facility] [facility] without acute abnormality. Will need MRI brain to evaluate for subacute stroke vs recrudescence of prior symptoms. Patient signed out to Dr. Lynelle Doctor pending MRI, labs and reassessment. [VK]  1740 Patient's MRI shows an acute nonhemorrhagic infarct in the left frontal lobe [JK]  1746 Case discussed with Dr. Amada Jupiter.  Will evaluate patient in consultation. [JK]  1848 CBC with Differential(!) [JK]  1848 CBC with Differential(!) Hemoglobin is decreased compared to previous [JK]  1848 Comprehensive metabolic panel(!) No significant abnormalities noted metabolic panel [JK]  2026 Case discussed with Dr Haroldine Laws regarding admission [JK]    Clinical Course User Index [JK] Linwood Dibbles, MD [VK] Rexford Maus, DO   Patient was initially seen by Dr. Theresia Lo.  Please see her note.  Patient presentation was concerning for possible stroke.  Patient's MRI did confirm a stroke.  I have consulted with neurohospitalist service Dr. Amada Jupiter.  They will evaluate the patient.  Labs are notable for anemia.  Hemoglobin is decreased compared to her previous values.  Patient denies any blood in her stool.  She has not noticed any dark stools melena.    Will consult with medical service for admission   Linwood Dibbles, MD 01/09/23 2026

## 2023-01-09 NOTE — ED Triage Notes (Signed)
Pt BIB [facility] for sudden onset of global weakness, daughter told EMS that pt just looks different. Pt has existing right sided weakness/deficits along with aphasia from previous stroke.   18g in L forearm Cbg 273 BP 139/78 Afib 60-90 bpm 97% RA   Pt denies any pain, reports her whole body feels "different." Pt c/o dizziness.

## 2023-01-09 NOTE — ED Notes (Signed)
ED TO INPATIENT HANDOFF REPORT  ED Nurse Name and Phone #: [phone] Gerene Nedd  S Name/Age/Gender [name] Petersen 89 y.o. female Room/Bed: 022C/022C  Code Status   Code Status: Do not attempt resuscitation (DNR) - Comfort care  Home/SNF/Other Home Patient oriented to: self, place, time, and situation Is this baseline? Yes   Triage Complete: Triage complete  Chief Complaint Acute CVA (cerebrovascular accident) [facility]) [I63.9]  Triage Note Pt BIB [facility] for sudden onset of global weakness, daughter told EMS that pt just looks different. Pt has existing right sided weakness/deficits along with aphasia from previous stroke.   18g in L forearm Cbg 273 BP 139/78 Afib 60-90 bpm 97% RA   Pt denies any pain, reports her whole body feels "different." Pt c/o dizziness.   Allergies Allergies  Allergen Reactions   Prednisone Other (See Comments)    Fatigue   Scopace [Scopolamine] Other (See Comments)    Hallucinations    Level of Care/Admitting Diagnosis ED Disposition     ED Disposition  Admit   Condition  --   Comment  Hospital Area: [facility] [[id number]]  Level of Care: Telemetry Medical [104]  May admit patient to [facility] or [facility] if equivalent level of care is available:: No  Covid Evaluation: Asymptomatic - no recent exposure (last 10 days) testing not required  Diagnosis: Acute CVA (cerebrovascular accident) [facility]) [[id number]]  Admitting Physician: Gery Pray [[id number]]  Attending Physician: Gery Pray [[id number]]  Certification:: I certify this patient will need inpatient services for at least 2 midnights  Expected Medical Readiness: 01/11/2023          B Medical/Surgery History Past Medical History:  Diagnosis Date   A-fib [facility])    Arthritis    hands   Atherosclerotic PVD with ulceration (HCC) 07/15/2017   RIGHT HEEL   Breast cancer (HCC)    Cancer (HCC)    Chronic heart failure with preserved ejection fraction (HFpEF) (HCC)     Complication of anesthesia    lost eyesight for a few days after surgery in the 1950's   Constipation    Diabetes mellitus without complication (HCC)    no medications    Hypertension    Macular degeneration, bilateral    "one eye wet; one eye dry; she gets shots" (07/07/2017)   PAD (peripheral artery disease) (HCC)    Pneumonia    Stroke (HCC)    some trouble swallowing   Past Surgical History:  Procedure Laterality Date   ABDOMINAL AORTOGRAM W/LOWER EXTREMITY N/A 06/16/2017   Procedure: ABDOMINAL AORTOGRAM W/LOWER EXTREMITY;  Surgeon: Fransisco Hertz, MD;  Location: [facility] INVASIVE CV LAB;  Service: Cardiovascular;  Laterality: N/A;  lt extermity   ABDOMINAL HYSTERECTOMY     AMPUTATION Left 07/06/2017   Procedure: AMPUTATION BELOW KNEE LEFT;  Surgeon: Larina Earthly, MD;  Location: [facility] OR;  Service: Vascular;  Laterality: Left;   CATARACT EXTRACTION W/ INTRAOCULAR LENS  IMPLANT, BILATERAL     DILATION AND CURETTAGE OF UTERUS     LOWER EXTREMITY ANGIOGRAPHY N/A 07/14/2017   Procedure: LOWER EXTREMITY ANGIOGRAPHY;  Surgeon: Chuck Hint, MD;  Location: [facility] INVASIVE CV LAB;  Service: Cardiovascular;  Laterality: N/A;     A IV Location/Drains/Wounds Patient Lines/Drains/Airways Status     Active Line/Drains/Airways     Name Placement date Placement time Site Days   Peripheral IV 01/09/23 18 G Anterior;Left;Proximal Forearm 01/09/23  1400  Forearm  less than 1  Intake/Output Last 24 hours No intake or output data in the 24 hours ending 01/09/23 2135  Labs/Imaging Results for orders placed or performed during the hospital encounter of 01/09/23 (from the past 48 hour(s))  Comprehensive metabolic panel     Status: Abnormal   Collection Time: 01/09/23  1:53 PM  Result Value Ref Range   Sodium 139 135 - 145 mmol/L   Potassium 3.8 3.5 - 5.1 mmol/L   Chloride 102 98 - 111 mmol/L   CO2 26 22 - 32 mmol/L   Glucose, Bld 198 (H) 70 - 99 mg/dL    Comment: Glucose  reference range applies only to samples taken after fasting for at least 8 hours.   BUN 22 8 - 23 mg/dL   Creatinine, Ser 1.61 (H) 0.44 - 1.00 mg/dL   Calcium 8.8 (L) 8.9 - 10.3 mg/dL   Total Protein 5.2 (L) 6.5 - 8.1 g/dL   Albumin 2.7 (L) 3.5 - 5.0 g/dL   AST 16 15 - 41 U/L   ALT 15 0 - 44 U/L   Alkaline Phosphatase 82 38 - 126 U/L   Total Bilirubin 0.4 0.3 - 1.2 mg/dL   GFR, Estimated 34 (L) >60 mL/min    Comment: (NOTE) Calculated using the CKD-EPI Creatinine Equation (2021)    Anion gap 11 5 - 15    Comment: Performed at [facility], [address], [place], [place] [address]  CBC with Differential     Status: Abnormal   Collection Time: 01/09/23  1:53 PM  Result Value Ref Range   WBC 6.2 4.0 - 10.5 K/uL   RBC 3.61 (L) 3.87 - 5.11 MIL/uL   Hemoglobin 9.0 (L) 12.0 - 15.0 g/dL   HCT 54.0 (L) 98.1 - 19.1 %   MCV 85.6 80.0 - 100.0 fL   MCH 24.9 (L) 26.0 - 34.0 pg   MCHC 29.1 (L) 30.0 - 36.0 g/dL   RDW 47.8 29.5 - 62.1 %   Platelets 335 150 - 400 K/uL   nRBC 0.0 0.0 - 0.2 %   Neutrophils Relative % 67 %   Neutro Abs 4.2 1.7 - 7.7 K/uL   Lymphocytes Relative 19 %   Lymphs Abs 1.2 0.7 - 4.0 K/uL   Monocytes Relative 11 %   Monocytes Absolute 0.7 0.1 - 1.0 K/uL   Eosinophils Relative 2 %   Eosinophils Absolute 0.1 0.0 - 0.5 K/uL   Basophils Relative 0 %   Basophils Absolute 0.0 0.0 - 0.1 K/uL   Immature Granulocytes 1 %   Abs Immature Granulocytes 0.04 0.00 - 0.07 K/uL    Comment: Performed at [facility], [address], [place], [place] [address]  Troponin I (High Sensitivity)     Status: None   Collection Time: 01/09/23  1:53 PM  Result Value Ref Range   Troponin I (High Sensitivity) 7 <18 ng/L    Comment: (NOTE) Elevated high sensitivity troponin I (hsTnI) values and significant  changes across serial measurements may suggest ACS but many other  chronic and acute conditions are known to elevate hsTnI results.  Refer to the "Links" section for chest  pain algorithms and additional  guidance. Performed at [facility], [address], [place], [place] [address]   Troponin I (High Sensitivity)     Status: None   Collection Time: 01/09/23  4:58 PM  Result Value Ref Range   Troponin I (High Sensitivity) 11 <18 ng/L    Comment: (NOTE) Elevated  high sensitivity troponin I (hsTnI) values and significant  changes across serial measurements may suggest ACS but many other  chronic and acute conditions are known to elevate hsTnI results.  Refer to the "Links" section for chest pain algorithms and additional  guidance. Performed at [facility], [address] [place], [place] [address]   CBG monitoring, ED     Status: Abnormal   Collection Time: 01/09/23  8:58 PM  Result Value Ref Range   Glucose-Capillary 119 (H) 70 - 99 mg/dL    Comment: Glucose reference range applies only to samples taken after fasting for at least 8 hours.   MR Brain Wo Contrast (neuro protocol)  Result Date: 01/09/2023 CLINICAL DATA:  Neuro deficit, acute, stroke suspected. Weakness. Altered mental status. Decreased use of the right upper extremity EXAM: MRI HEAD WITHOUT CONTRAST TECHNIQUE: Multiplanar, multiecho pulse sequences of the brain and surrounding structures were obtained without intravenous contrast. COMPARISON:  CT head without contrast 01/09/2023. MR head without contrast 11/19/2022. FINDINGS: Brain: Acute nonhemorrhagic infarct is present in the high medial posterior left frontal lobe, adjacent to the previous infarct. T2 and FLAIR hyperintensities are associated. Moderate atrophy and diffuse white matter changes are otherwise stable. Remote lacunar infarct is present in the right corona radiata. Dilated perivascular spaces are present throughout the basal ganglia. The ventricles are proportionate to the degree of atrophy. No significant extraaxial fluid collection is present. The brainstem and cerebellum are within normal limits. Midline  structures are within normal limits. Vascular: Flow is present in the major intracranial arteries. Skull and upper cervical spine: Mild degenerative anterolisthesis is present at C3-4 C4-5 and C5-6. Craniocervical junction is normal. Sinuses/Orbits: The paranasal sinuses and mastoid air cells are clear. Bilateral lens replacements are noted. Globes and orbits are otherwise unremarkable. IMPRESSION: 1. Acute nonhemorrhagic infarct involving the high medial posterior left frontal lobe, adjacent to the previous infarct. 2. Moderate atrophy and diffuse white matter disease is otherwise stable. This likely reflects the sequela of chronic microvascular ischemia. Electronically Signed   By: Marin Roberts M.D.   On: 01/09/2023 17:21   DG Chest Port 1 View  Result Date: 01/09/2023 CLINICAL DATA:  Shortness of breath.  Weakness. EXAM: PORTABLE CHEST 1 VIEW COMPARISON:  One-view chest x-ray 11/17/2022 FINDINGS: The heart is enlarged. Atherosclerotic changes are present at the aortic arch. Lung volumes are low. No edema or effusion is present. The visualized soft tissues and bony thorax are unremarkable. IMPRESSION: 1. Cardiomegaly without failure. 2. Low lung volumes. Electronically Signed   By: Marin Roberts M.D.   On: 01/09/2023 15:49   CT Head Wo Contrast  Result Date: 01/09/2023 CLINICAL DATA:  Neurologic deficit. EXAM: CT HEAD WITHOUT CONTRAST TECHNIQUE: Contiguous axial images were obtained from the base of the skull through the vertex without intravenous contrast. RADIATION DOSE REDUCTION: This exam was performed according to the departmental dose-optimization program which includes automated exposure control, adjustment of the mA and/or kV according to patient size and/or use of iterative reconstruction technique. COMPARISON:  CT 11/18/2022, MRI 11/19/2022 FINDINGS: Brain: Ventricles, cisterns and other CSF spaces are within normal relative patient's age. There is moderate chronic ischemic  microvascular disease. Old high left frontal parietal infarct as seen on previous MRI. Small lacunar infarcts of the basal ganglia bilaterally. No mass, mass effect, shift of midline structures or acute hemorrhage. No evidence of acute infarction. Vascular: No hyperdense vessel or unexpected calcification. Skull: Normal. Negative for fracture or focal lesion. Sinuses/Orbits: No acute finding. Other:  None. IMPRESSION: 1. No acute findings. 2. Moderate chronic ischemic microvascular disease. Old high left frontal parietal infarct. Small lacunar infarcts of the basal ganglia bilaterally. Electronically Signed   By: Elberta Fortis M.D.   On: 01/09/2023 14:58    Pending Labs Unresulted Labs (From admission, onward)     Start     Ordered   01/10/23 0500  Lipid panel  (Labs)  Tomorrow morning,   R       Comments: Fasting    01/09/23 2042   01/10/23 0500  CBC  (Labs)  Tomorrow morning,   R        01/09/23 2042   01/10/23 0500  Comprehensive metabolic panel  (Labs)  Tomorrow morning,   R        01/09/23 2042   01/09/23 1417  Urinalysis, Routine w reflex microscopic -Urine, Clean Catch  Once,   URGENT       Question:  Specimen Source  Answer:  Urine, Clean Catch   01/09/23 1416            Vitals/Pain Today's Vitals   01/09/23 1900 01/09/23 1915 01/09/23 2051 01/09/23 2125  BP: (!) 145/130 (!) 144/95  127/78  Pulse: (!) 112 86    Resp:  18    Temp:   98.4 F (36.9 C)   TempSrc:   Oral   SpO2: 97% 95%    Weight:      Height:      PainSc:        Isolation Precautions No active isolations  Medications Medications  insulin aspart (novoLOG) injection 0-15 Units (0 Units Subcutaneous Not Given 01/09/23 2102)   stroke: early stages of recovery book (has no administration in time range)  0.9 %  sodium chloride infusion ( Intravenous New Bag/Given 01/09/23 2125)  acetaminophen (TYLENOL) tablet 650 mg (has no administration in time range)    Or  acetaminophen (TYLENOL) 160 MG/5ML solution  650 mg (has no administration in time range)    Or  acetaminophen (TYLENOL) suppository 650 mg (has no administration in time range)  amiodarone (PACERONE) tablet 200 mg (200 mg Oral Given 01/09/23 2124)  apixaban (ELIQUIS) tablet 5 mg (5 mg Oral Given 01/09/23 2124)  diltiazem (CARDIZEM CD) 24 hr capsule 360 mg (360 mg Oral Given 01/09/23 2125)    Mobility manual wheelchair     Focused Assessments Neuro Assessment Handoff:  Swallow screen pass? Yes    NIH Stroke Scale  Dizziness Present: No Headache Present: No Interval: Shift assessment Level of Consciousness (1a.)   : Alert, keenly responsive LOC Questions (1b. )   : Answers both questions correctly LOC Commands (1c. )   : Performs both tasks correctly Best Gaze (2. )  : Partial gaze palsy Visual (3. )  : No visual loss Facial Palsy (4. )    : Normal symmetrical movements Motor Arm, Left (5a. )   : No drift Motor Arm, Right (5b. ) : No effort against gravity Motor Leg, Left (6a. )  : No drift Motor Leg, Right (6b. ) : No effort against gravity Limb Ataxia (7. ): Amputation or joint fusion Sensory (8. )  : Normal, no sensory loss Best Language (9. )  : No aphasia Dysarthria (10. ): Normal Extinction/Inattention (11.)   : No Abnormality Complete NIHSS TOTAL: 7     Neuro Assessment:   Neuro Checks:   Shift assessment (01/09/23 2049)  Has TPA been given? No If patient is a Neuro Trauma and  patient is going to OR before floor call report to 4N Charge nurse: [phone] or [phone]   R Recommendations: See Admitting Provider Note  Report given to:   Additional Notes: n/a

## 2023-01-09 NOTE — ED Notes (Signed)
Patient transported to CT 

## 2023-01-09 NOTE — ED Notes (Signed)
Patient returned from CT. Stretcher locked in lowest position, call bell in reach, pt in position of comfort.  Family at bedside.

## 2023-01-09 NOTE — H&P (Incomplete)
PCP:   Pcp, No   Chief Complaint:  Right arm weakness  HPI: This is a 89 year old female with past medical history of HTN, HLD, PAD, T2DM, H HFpEF, recent CVA.  Patient is a resident of [facility].  Today her daughter went to visit.  While patient eats, her daughter noted the patient was not using her right arm.  Additionally the patient's response was becoming progressively slower.  She developed difficulty speaking.  911 was called, she was brought to the ER.  The patient was recently admitted 7/21-8/6 with A-fib with RVR and a CVA.  She was discharged on Eliquis and 81 mg ASA p.o. daily.  Patient has been bedbound since since her stroke.  In the ER patient's vital stable.  CT head without evidence for acute CVA.  MRI head shows acute infarct.  EKG atrial flutter HR 66.  Neurology consulted by EDP.  Hemoglobin 9, prior hemoglobin 12 (11/22/2022), creatinine 1.42 prior creatinine 1.15 (11/21/2022)  Review of Systems:  Per HPI  Past Medical History: Past Medical History:  Diagnosis Date   A-fib (HCC)    Arthritis    hands   Atherosclerotic PVD with ulceration (HCC) 07/15/2017   RIGHT HEEL   Breast cancer (HCC)    Cancer (HCC)    Chronic heart failure with preserved ejection fraction (HFpEF) (HCC)    Complication of anesthesia    lost eyesight for a few days after surgery in the 1950's   Constipation    Diabetes mellitus without complication (HCC)    no medications    Hypertension    Macular degeneration, bilateral    "one eye wet; one eye dry; she gets shots" (07/07/2017)   PAD (peripheral artery disease) (HCC)    Pneumonia    Stroke (HCC)    some trouble swallowing   Past Surgical History:  Procedure Laterality Date   ABDOMINAL AORTOGRAM W/LOWER EXTREMITY N/A 06/16/2017   Procedure: ABDOMINAL AORTOGRAM W/LOWER EXTREMITY;  Surgeon: Fransisco Hertz, MD;  Location: [facility] INVASIVE CV LAB;  Service: Cardiovascular;  Laterality: N/A;  lt extermity   ABDOMINAL HYSTERECTOMY     AMPUTATION  Left 07/06/2017   Procedure: AMPUTATION BELOW KNEE LEFT;  Surgeon: Larina Earthly, MD;  Location: [facility] OR;  Service: Vascular;  Laterality: Left;   CATARACT EXTRACTION W/ INTRAOCULAR LENS  IMPLANT, BILATERAL     DILATION AND CURETTAGE OF UTERUS     LOWER EXTREMITY ANGIOGRAPHY N/A 07/14/2017   Procedure: LOWER EXTREMITY ANGIOGRAPHY;  Surgeon: Chuck Hint, MD;  Location: [facility] INVASIVE CV LAB;  Service: Cardiovascular;  Laterality: N/A;    Medications: Prior to Admission medications   Medication Sig Start Date End Date Taking? Authorizing Provider  acetaminophen (TYLENOL) 325 MG tablet Take 3 tablets (975 mg total) by mouth at bedtime as needed for mild pain. 09/12/22   Johnson, Clanford L, MD  amiodarone (PACERONE) 200 MG tablet Twice a day for 3 weeks; then 200 mg daily. 11/23/22   Vassie Loll, MD  apixaban (ELIQUIS) 5 MG TABS tablet Take 1 tablet (5 mg total) by mouth 2 (two) times daily. 09/12/22   Johnson, Clanford L, MD  BETA CAROTENE PO Take 1 tablet by mouth daily.    [provider]  Calcium Carbonate Antacid (TUMS PO) Take 2 tablets by mouth every 6 (six) hours as needed (acid reflux).    [provider]  Cholecalciferol (VITAMIN D3) 50 MCG (2000 UT) TABS Take 2,000 Units by mouth daily.    [provider]  cyanocobalamin 1000 MCG tablet Take 1,000 mcg by mouth daily.    [provider]  diltiazem (CARDIZEM CD) 360 MG 24 hr capsule Take 1 capsule (360 mg total) by mouth daily. 11/24/22   Vassie Loll, MD  furosemide (LASIX) 20 MG tablet Take 20 mg by mouth daily.    [provider]  gabapentin (NEURONTIN) 100 MG capsule Take 1 capsule (100 mg total) by mouth 2 (two) times daily. 11/23/22   Vassie Loll, MD  hydrocortisone (ANUSOL-HC) 2.5 % rectal cream Place 1 Application rectally 2 (two) times daily.    [provider]  lovastatin (MEVACOR) 20 MG tablet Take 20 mg by mouth daily at 6 PM. 11/30/18   [provider]   Menthol, Topical Analgesic, (BIOFREEZE) 4 % GEL Apply topically as needed.    [provider]  Multiple Vitamins-Minerals (I-VITE) TABS Take 1 tablet by mouth daily.    [provider]  nystatin ointment (MYCOSTATIN) Apply 1 Application topically 2 (two) times daily. 09/07/22   Aida Raider, NP  pantoprazole (PROTONIX) 40 MG tablet Take 1 tablet (40 mg total) by mouth daily. 11/24/22   Vassie Loll, MD  rOPINIRole (REQUIP) 0.25 MG tablet Take 1 tablet (0.25 mg total) by mouth 3 (three) times daily. 11/23/22   Vassie Loll, MD  sennosides-docusate sodium (SENOKOT-S) 8.6-50 MG tablet Take 1 tablet by mouth See admin instructions. 1 tablet at bedtime every other day for constipation    [provider]    Allergies:   Allergies  Allergen Reactions   Prednisone Other (See Comments)    Fatigue   Scopace [Scopolamine] Other (See Comments)    Hallucinations    Social History:  reports that she has never smoked. She has never used smokeless tobacco. She reports that she does not drink alcohol and does not use drugs.  Family History: Family History  Adopted: Yes    Physical Exam: Vitals:   01/09/23 1830 01/09/23 1845 01/09/23 1900 01/09/23 1915  BP: (!) 144/77 (!) 148/89 (!) 145/130 (!) 144/95  Pulse: 80 74 (!) 112 86  Resp:    18  Temp:      TempSrc:      SpO2: 99% 98% 97% 95%  Weight:      Height:        General:  Alert and oriented times three, well developed and nourished, no acute distress Eyes: PERRLA, pink conjunctiva, no scleral icterus ENT: Moist oral mucosa, neck supple, no thyromegaly Lungs: clear to ascultation, no wheeze, no crackles, no use of accessory muscles Cardiovascular: regular rate and rhythm, no regurgitation, no gallops, no murmurs. No carotid bruits, no JVD Abdomen: soft, positive BS, non-tender, non-distended, no organomegaly, not an acute abdomen GU: not examined Neuro: CN II - XII grossly intact, sensation  intact Musculoskeletal: strength 5/5 all extremities, no clubbing, cyanosis or edema Skin: no rash, no subcutaneous crepitation, no decubitus Psych: appropriate patient  MO L BKA; RLE 2+ edema  Labs on Admission:  Recent Labs    01/09/23 1353  NA 139  K 3.8  CL 102  CO2 26  GLUCOSE 198*  BUN 22  CREATININE 1.42*  CALCIUM 8.8*   Recent Labs    01/09/23 1353  AST 16  ALT 15  ALKPHOS 82  BILITOT 0.4  PROT 5.2*  ALBUMIN 2.7*    Recent Labs    01/09/23 1353  WBC 6.2  NEUTROABS 4.2  HGB 9.0*  HCT 30.9*  MCV 85.6  PLT 335  Radiological Exams on Admission: MR Brain Wo Contrast (neuro protocol)  Result Date: 01/09/2023 CLINICAL DATA:  Neuro deficit, acute, stroke suspected. Weakness. Altered mental status. Decreased use of the right upper extremity EXAM: MRI HEAD WITHOUT CONTRAST TECHNIQUE: Multiplanar, multiecho pulse sequences of the brain and surrounding structures were obtained without intravenous contrast. COMPARISON:  CT head without contrast 01/09/2023. MR head without contrast 11/19/2022. FINDINGS: Brain: Acute nonhemorrhagic infarct is present in the high medial posterior left frontal lobe, adjacent to the previous infarct. T2 and FLAIR hyperintensities are associated. Moderate atrophy and diffuse white matter changes are otherwise stable. Remote lacunar infarct is present in the right corona radiata. Dilated perivascular spaces are present throughout the basal ganglia. The ventricles are proportionate to the degree of atrophy. No significant extraaxial fluid collection is present. The brainstem and cerebellum are within normal limits. Midline structures are within normal limits. Vascular: Flow is present in the major intracranial arteries. Skull and upper cervical spine: Mild degenerative anterolisthesis is present at C3-4 C4-5 and C5-6. Craniocervical junction is normal. Sinuses/Orbits: The paranasal sinuses and mastoid air cells are clear. Bilateral lens  replacements are noted. Globes and orbits are otherwise unremarkable. IMPRESSION: 1. Acute nonhemorrhagic infarct involving the high medial posterior left frontal lobe, adjacent to the previous infarct. 2. Moderate atrophy and diffuse white matter disease is otherwise stable. This likely reflects the sequela of chronic microvascular ischemia. Electronically Signed   By: Marin Roberts M.D.   On: 01/09/2023 17:21   DG Chest Port 1 View  Result Date: 01/09/2023 CLINICAL DATA:  Shortness of breath.  Weakness. EXAM: PORTABLE CHEST 1 VIEW COMPARISON:  One-view chest x-ray 11/17/2022 FINDINGS: The heart is enlarged. Atherosclerotic changes are present at the aortic arch. Lung volumes are low. No edema or effusion is present. The visualized soft tissues and bony thorax are unremarkable. IMPRESSION: 1. Cardiomegaly without failure. 2. Low lung volumes. Electronically Signed   By: Marin Roberts M.D.   On: 01/09/2023 15:49   CT Head Wo Contrast  Result Date: 01/09/2023 CLINICAL DATA:  Neurologic deficit. EXAM: CT HEAD WITHOUT CONTRAST TECHNIQUE: Contiguous axial images were obtained from the base of the skull through the vertex without intravenous contrast. RADIATION DOSE REDUCTION: This exam was performed according to the departmental dose-optimization program which includes automated exposure control, adjustment of the mA and/or kV according to patient size and/or use of iterative reconstruction technique. COMPARISON:  CT 11/18/2022, MRI 11/19/2022 FINDINGS: Brain: Ventricles, cisterns and other CSF spaces are within normal relative patient's age. There is moderate chronic ischemic microvascular disease. Old high left frontal parietal infarct as seen on previous MRI. Small lacunar infarcts of the basal ganglia bilaterally. No mass, mass effect, shift of midline structures or acute hemorrhage. No evidence of acute infarction. Vascular: No hyperdense vessel or unexpected calcification. Skull: Normal.  Negative for fracture or focal lesion. Sinuses/Orbits: No acute finding. Other: None. IMPRESSION: 1. No acute findings. 2. Moderate chronic ischemic microvascular disease. Old high left frontal parietal infarct. Small lacunar infarcts of the basal ganglia bilaterally. Electronically Signed   By: Elberta Fortis M.D.   On: 01/09/2023 14:58    Assessment/Plan Present on Admission:  Subacute ischemic stroke (HCC) -Appreciate neurology's input.   -Will continue Eliquis -Neurochecks, PT/OT/speech consult -No need for permissive hypertension -2D echo, MRA head/neck not repeated -Statin not recommended   Anemia -Patient with reported hemoglobin drop from 12 to 9 since August 15.  Anemia panel ordered.  Occult stool ordered.  Eliquis continued -No clear evidence of  report of bleeding -H&H noon and a.m. -Protonix IV x 1 ordered   Acute on chronic kidney disease , stage 3b -Gentle IV fluid hydration.  BMP in a.m.   Essential hypertension -Cardizem resumed   Atrial fibrillation/flutter -Continue amiodarone, Eliquis   Hyperlipidemia -Statin not recommended is done patient's age   Type 2 diabetes mellitus with obesity (HCC) -Sliding scale insulin ordered   HFpEF -Lasix p.o. daily held with AKI.  Gentle hydration underway  H/o beast cancer -No longer on Femara  Johnnye Sandford 01/09/2023, 8:45 PM

## 2023-01-09 NOTE — ED Notes (Signed)
Pt states she can't urinate at this time because she urinated when she was over in MRI.

## 2023-01-09 NOTE — ED Notes (Signed)
Pt returned from MRI, stretcher locked in lowest position, call bell in reach, pt in position of comfort with family at bedside.

## 2023-01-10 ENCOUNTER — Other Ambulatory Visit (HOSPITAL_COMMUNITY): Payer: Self-pay

## 2023-01-10 ENCOUNTER — Telehealth (HOSPITAL_COMMUNITY): Payer: Self-pay | Admitting: Pharmacy Technician

## 2023-01-10 DIAGNOSIS — I639 Cerebral infarction, unspecified: Secondary | ICD-10-CM | POA: Diagnosis not present

## 2023-01-10 DIAGNOSIS — I10 Essential (primary) hypertension: Secondary | ICD-10-CM

## 2023-01-10 DIAGNOSIS — I4891 Unspecified atrial fibrillation: Secondary | ICD-10-CM

## 2023-01-10 DIAGNOSIS — D649 Anemia, unspecified: Secondary | ICD-10-CM | POA: Diagnosis not present

## 2023-01-10 DIAGNOSIS — L8990 Pressure ulcer of unspecified site, unspecified stage: Secondary | ICD-10-CM | POA: Insufficient documentation

## 2023-01-10 DIAGNOSIS — E119 Type 2 diabetes mellitus without complications: Secondary | ICD-10-CM

## 2023-01-10 LAB — VITAMIN B12: Vitamin B-12: 241 pg/mL (ref 180–914)

## 2023-01-10 LAB — LIPID PANEL
Cholesterol: 112 mg/dL (ref 0–200)
HDL: 51 mg/dL (ref >40)
LDL Cholesterol: 49 mg/dL (ref 0–99)
Total CHOL/HDL Ratio: 2.2 RATIO
Triglycerides: 58 mg/dL (ref <150)
VLDL: 12 mg/dL (ref 0–40)

## 2023-01-10 LAB — CBC
HCT: 32.3 % — ABNORMAL LOW (ref 36.0–46.0)
Hemoglobin: 9.3 g/dL — ABNORMAL LOW (ref 12.0–15.0)
MCH: 24.6 pg — ABNORMAL LOW (ref 26.0–34.0)
MCHC: 28.8 g/dL — ABNORMAL LOW (ref 30.0–36.0)
MCV: 85.4 fL (ref 80.0–100.0)
Platelets: 281 10*3/uL (ref 150–400)
RBC: 3.78 MIL/uL — ABNORMAL LOW (ref 3.87–5.11)
RDW: 15.2 % (ref 11.5–15.5)
WBC: 6.2 10*3/uL (ref 4.0–10.5)
nRBC: 0 % (ref 0.0–0.2)

## 2023-01-10 LAB — GLUCOSE, CAPILLARY
Glucose-Capillary: 103 mg/dL — ABNORMAL HIGH (ref 70–99)
Glucose-Capillary: 112 mg/dL — ABNORMAL HIGH (ref 70–99)
Glucose-Capillary: 147 mg/dL — ABNORMAL HIGH (ref 70–99)
Glucose-Capillary: 154 mg/dL — ABNORMAL HIGH (ref 70–99)
Glucose-Capillary: 169 mg/dL — ABNORMAL HIGH (ref 70–99)
Glucose-Capillary: 97 mg/dL (ref 70–99)

## 2023-01-10 LAB — FOLATE: Folate: 8.2 ng/mL (ref >5.9)

## 2023-01-10 LAB — COMPREHENSIVE METABOLIC PANEL
ALT: 16 U/L (ref 0–44)
AST: 15 U/L (ref 15–41)
Albumin: 2.7 g/dL — ABNORMAL LOW (ref 3.5–5.0)
Alkaline Phosphatase: 81 U/L (ref 38–126)
Anion gap: 12 (ref 5–15)
BUN: 21 mg/dL (ref 8–23)
CO2: 23 mmol/L (ref 22–32)
Calcium: 8.8 mg/dL — ABNORMAL LOW (ref 8.9–10.3)
Chloride: 104 mmol/L (ref 98–111)
Creatinine, Ser: 1.44 mg/dL — ABNORMAL HIGH (ref 0.44–1.00)
GFR, Estimated: 34 mL/min — ABNORMAL LOW (ref >60)
Glucose, Bld: 112 mg/dL — ABNORMAL HIGH (ref 70–99)
Potassium: 3.6 mmol/L (ref 3.5–5.1)
Sodium: 139 mmol/L (ref 135–145)
Total Bilirubin: 0.6 mg/dL (ref 0.3–1.2)
Total Protein: 5.3 g/dL — ABNORMAL LOW (ref 6.5–8.1)

## 2023-01-10 LAB — HEMOGLOBIN A1C
Hgb A1c MFr Bld: 7.6 % — ABNORMAL HIGH (ref 4.8–5.6)
Mean Plasma Glucose: 171.42 mg/dL

## 2023-01-10 MED ORDER — PRAVASTATIN SODIUM 40 MG PO TABS
20.0000 mg | ORAL_TABLET | Freq: Every day | ORAL | Status: DC
  Administered 2023-01-10: 20 mg via ORAL
  Filled 2023-01-10: qty 1

## 2023-01-10 MED ORDER — INSULIN ASPART 100 UNIT/ML IJ SOLN
0.0000 [IU] | Freq: Three times a day (TID) | INTRAMUSCULAR | Status: DC
  Administered 2023-01-10: 3 [IU] via SUBCUTANEOUS
  Administered 2023-01-11: 2 [IU] via SUBCUTANEOUS

## 2023-01-10 MED ORDER — PANTOPRAZOLE SODIUM 40 MG IV SOLR
40.0000 mg | Freq: Every day | INTRAVENOUS | Status: DC
  Administered 2023-01-10 – 2023-01-11 (×2): 40 mg via INTRAVENOUS
  Filled 2023-01-10 (×2): qty 10

## 2023-01-10 NOTE — Evaluation (Signed)
Physical Therapy Evaluation and Discharge Patient Details Name: [name] Petersen MRN: [id number] DOB: Dec 28, 1926 Today's Date: 01/10/2023  History of Present Illness  Pt is a 89 y/o female who presents 01/09/2023 from [facility] with R UE weakness. MRI revealed L frontal infarct. PMH significant for HTN, PAD, DM II, HFpEF, L BKA, macular degneration.   Clinical Impression  Patient evaluated by Physical Therapy with no further acute PT needs identified. All education has been completed and the patient has no further questions. At the time of PT eval pt required +2 assist for all aspects of mobility and repositioning in the bed. Pt requires a hoyer lift for transfers and is dependent for ADL's so appears to be at baseline of function. See below for any follow-up Physical Therapy or equipment needs. PT is signing off. Thank you for this referral.         If plan is discharge home, recommend the following: Two people to help with walking and/or transfers;Two people to help with bathing/dressing/bathroom;Direct supervision/assist for medications management;Direct supervision/assist for financial management;Assist for transportation;Help with stairs or ramp for entrance;Supervision due to cognitive status   Can travel by private vehicle   No    Equipment Recommendations Other (comment) (TBD by next venue of care)  Recommendations for Other Services       Functional Status Assessment Patient has not had a recent decline in their functional status     Precautions / Restrictions Precautions Precautions: Fall Restrictions Weight Bearing Restrictions: No      Mobility  Bed Mobility Overal bed mobility: Needs Assistance Bed Mobility: Rolling, Supine to Sit, Sit to Supine Rolling: +2 for physical assistance, Max assist   Supine to sit: +2 for physical assistance, Max assist Sit to supine: +2 for physical assistance, Max assist   General bed mobility comments: Pt needs consistent +2  assist for all aspects of bed mobility and repositioning in bed. Pt pushing with L UE off bed surface toward the R. Pt required mod assist to maintain sitting balance at EOB. Bed pad utilized for return to supine and repositioning.    Transfers                   General transfer comment: Per caregiver at bedside staff utilizes a hoyer lift pad (present in room) for transfers. Nonambulatory at baseline.    Ambulation/Gait                  Stairs            Wheelchair Mobility     Tilt Bed    Modified Rankin (Stroke Patients Only)       Balance Overall balance assessment: Needs assistance Sitting-balance support: Single extremity supported, Feet supported Sitting balance-Leahy Scale: Poor                                       Pertinent Vitals/Pain Pain Assessment Pain Assessment: Faces Faces Pain Scale: Hurts little more Pain Location: knee from pressure on bed rail and buttock . pt noted to have some stool on skin and report relief with hygiene / barrier cream Pain Descriptors / Indicators: Discomfort Pain Intervention(s): Limited activity within patient's tolerance, Monitored during session, Repositioned    Home Living Family/patient expects to be discharged to:: Skilled nursing facility  Additional Comments: pt from [facility] at this time. Pt has a private pay sitter [name] Petersen in the room.    Prior Function Prior Level of Function : Needs assist  Cognitive Assist : Mobility (cognitive);ADLs (cognitive) Mobility (Cognitive): Step by step cues   Physical Assist : Mobility (physical);ADLs (physical) Mobility (physical): Bed mobility;Transfers ADLs (physical): Feeding;Grooming;Bathing;IADLs;Toileting;Dressing Mobility Comments: dependent on hoyer lift to w/c ADLs Comments: assist for all adls     Extremity/Trunk Assessment   Upper Extremity Assessment Upper Extremity Assessment: Defer to OT  evaluation RUE Deficits / Details: demonstrates movement in all joints ( shoulder elbow wrist and hand) with visual attention and increased time. Pt decrease grasp but able to close hand completely. RUE Sensation: decreased light touch RUE Coordination: decreased fine motor;decreased gross motor    Lower Extremity Assessment Lower Extremity Assessment: RLE deficits/detail;LLE deficits/detail RLE Deficits / Details: When PT/OT arrived pt with R hip flexed, externally rotated and abducted with R knee resting on the railing of the bed. Reports "I can't" with attempts at movement. Allowed PROM of hip and knee. Some active movement noted when mobility initiated. Pt wiggling toes to command but otherwise no active movement to command. LLE Deficits / Details: BKA noted. Pt able to actively flex and extend knee while sitting EOB. Some hip flexion noted against gravity while sitting EOB as well.    Cervical / Trunk Assessment Cervical / Trunk Assessment: Kyphotic  Communication   Communication Communication: No apparent difficulties Cueing Techniques: Verbal cues;Gestural cues;Tactile cues  Cognition Arousal: Alert Behavior During Therapy: WFL for tasks assessed/performed Overall Cognitive Status: History of cognitive impairments - at baseline                                 General Comments: Pt with delayed response to questions and needs extended time to answer        General Comments General comments (skin integrity, edema, etc.): increased risk for skin break down due to prolonged positioning in the bed. Recommendation for air mattress if continue acute admission and MD made aware.    Exercises     Assessment/Plan    PT Assessment All further PT needs can be met in the next venue of care  PT Problem List         PT Treatment Interventions      PT Goals (Current goals can be found in the Care Plan section)  Acute Rehab PT Goals Patient Stated Goal: None stated PT  Goal Formulation: All assessment and education complete, DC therapy    Frequency       Co-evaluation PT/OT/SLP Co-Evaluation/Treatment: Yes Reason for Co-Treatment: Complexity of the patient's impairments (multi-system involvement);Necessary to address cognition/behavior during functional activity;For patient/therapist safety;To address functional/ADL transfers PT goals addressed during session: Mobility/safety with mobility;Balance;Strengthening/ROM OT goals addressed during session: ADL's and self-care;Proper use of Adaptive equipment and DME;Strengthening/ROM       AM-PAC PT "6 Clicks" Mobility  Outcome Measure Help needed turning from your back to your side while in a flat bed without using bedrails?: Total Help needed moving from lying on your back to sitting on the side of a flat bed without using bedrails?: Total Help needed moving to and from a bed to a chair (including a wheelchair)?: Total Help needed standing up from a chair using your arms (e.g., wheelchair or bedside chair)?: Total Help needed to walk in hospital room?: Total Help needed climbing 3-5  steps with a railing? : Total 6 Click Score: 6    End of Session   Activity Tolerance: Patient tolerated treatment well Patient left: in bed;with call bell/phone within reach;with family/visitor present Nurse Communication: Mobility status (Recommendation for air mattress) PT Visit Diagnosis: Other abnormalities of gait and mobility (R26.89);Difficulty in walking, not elsewhere classified (R26.2)    Time: 1914-7829 PT Time Calculation (min) (ACUTE ONLY): 22 min   Charges:   PT Evaluation $PT Eval Moderate Complexity: 1 Mod   PT General Charges $$ ACUTE PT VISIT: 1 Visit         [name] Petersen, PT, DPT Acute Rehabilitation Services Secure Chat Preferred Office: [phone]   [name] Petersen 01/10/2023, 3:22 PM

## 2023-01-10 NOTE — TOC Benefit Eligibility Note (Signed)
Patient Product/process development scientist completed.    The patient is insured through [employer]. Patient has Medicare and is not eligible for a copay card, but may be able to apply for patient assistance, if available.    Ran test claim for dabigatran (Pradaxa) 75 mg  and Requires Prior Authorization   This test claim was processed through [facility]- copay amounts may vary at other pharmacies due to [employer], or as the patient moves through the different stages of their insurance plan.     Roland Earl, CPHT Pharmacy Technician III Certified Patient Advocate [facility] Patient Advocate Team Direct Number: [phone]  Fax: [phone]

## 2023-01-10 NOTE — Progress Notes (Addendum)
STROKE TEAM PROGRESS NOTE   BRIEF HPI Ms. [name] Petersen is a 89 y.o. female with history of hypertension, hyperlipidemia, PAD with left BKA, diabetes, A-flutter on Eliquis, CHF and recent stroke in July presenting with right-sided weakness and dysarthria.  MRI brain demonstrates new infarct in high medial posterior left frontal lobe.  INTERIM HISTORY/SUBJECTIVE Patient remains hemodynamically stable but does demonstrate a slight right facial droop and dysarthria.  She also has notable right arm and leg weakness.   OBJECTIVE  CBC    Component Value Date/Time   WBC 6.2 01/10/2023 0715   RBC 3.78 (L) 01/10/2023 0715   HGB 9.3 (L) 01/10/2023 0715   HCT 32.3 (L) 01/10/2023 0715   PLT 281 01/10/2023 0715   MCV 85.4 01/10/2023 0715   MCH 24.6 (L) 01/10/2023 0715   MCHC 28.8 (L) 01/10/2023 0715   RDW 15.2 01/10/2023 0715   LYMPHSABS 1.2 01/09/2023 1353   MONOABS 0.7 01/09/2023 1353   EOSABS 0.1 01/09/2023 1353   BASOSABS 0.0 01/09/2023 1353    BMET    Component Value Date/Time   NA 139 01/10/2023 0715   K 3.6 01/10/2023 0715   CL 104 01/10/2023 0715   CO2 23 01/10/2023 0715   GLUCOSE 112 (H) 01/10/2023 0715   BUN 21 01/10/2023 0715   CREATININE 1.44 (H) 01/10/2023 0715   CALCIUM 8.8 (L) 01/10/2023 0715   GFRNONAA 34 (L) 01/10/2023 0715    IMAGING past 24 hours MR Brain Wo Contrast (neuro protocol)  Result Date: 01/09/2023 CLINICAL DATA:  Neuro deficit, acute, stroke suspected. Weakness. Altered mental status. Decreased use of the right upper extremity EXAM: MRI HEAD WITHOUT CONTRAST TECHNIQUE: Multiplanar, multiecho pulse sequences of the brain and surrounding structures were obtained without intravenous contrast. COMPARISON:  CT head without contrast 01/09/2023. MR head without contrast 11/19/2022. FINDINGS: Brain: Acute nonhemorrhagic infarct is present in the high medial posterior left frontal lobe, adjacent to the previous infarct. T2 and FLAIR hyperintensities are  associated. Moderate atrophy and diffuse white matter changes are otherwise stable. Remote lacunar infarct is present in the right corona radiata. Dilated perivascular spaces are present throughout the basal ganglia. The ventricles are proportionate to the degree of atrophy. No significant extraaxial fluid collection is present. The brainstem and cerebellum are within normal limits. Midline structures are within normal limits. Vascular: Flow is present in the major intracranial arteries. Skull and upper cervical spine: Mild degenerative anterolisthesis is present at C3-4 C4-5 and C5-6. Craniocervical junction is normal. Sinuses/Orbits: The paranasal sinuses and mastoid air cells are clear. Bilateral lens replacements are noted. Globes and orbits are otherwise unremarkable. IMPRESSION: 1. Acute nonhemorrhagic infarct involving the high medial posterior left frontal lobe, adjacent to the previous infarct. 2. Moderate atrophy and diffuse white matter disease is otherwise stable. This likely reflects the sequela of chronic microvascular ischemia. Electronically Signed   By: Marin Roberts M.D.   On: 01/09/2023 17:21   DG Chest Port 1 View  Result Date: 01/09/2023 CLINICAL DATA:  Shortness of breath.  Weakness. EXAM: PORTABLE CHEST 1 VIEW COMPARISON:  One-view chest x-ray 11/17/2022 FINDINGS: The heart is enlarged. Atherosclerotic changes are present at the aortic arch. Lung volumes are low. No edema or effusion is present. The visualized soft tissues and bony thorax are unremarkable. IMPRESSION: 1. Cardiomegaly without failure. 2. Low lung volumes. Electronically Signed   By: Marin Roberts M.D.   On: 01/09/2023 15:49   CT Head Wo Contrast  Result Date: 01/09/2023 CLINICAL DATA:  Neurologic deficit.  EXAM: CT HEAD WITHOUT CONTRAST TECHNIQUE: Contiguous axial images were obtained from the base of the skull through the vertex without intravenous contrast. RADIATION DOSE REDUCTION: This exam was  performed according to the departmental dose-optimization program which includes automated exposure control, adjustment of the mA and/or kV according to patient size and/or use of iterative reconstruction technique. COMPARISON:  CT 11/18/2022, MRI 11/19/2022 FINDINGS: Brain: Ventricles, cisterns and other CSF spaces are within normal relative patient's age. There is moderate chronic ischemic microvascular disease. Old high left frontal parietal infarct as seen on previous MRI. Small lacunar infarcts of the basal ganglia bilaterally. No mass, mass effect, shift of midline structures or acute hemorrhage. No evidence of acute infarction. Vascular: No hyperdense vessel or unexpected calcification. Skull: Normal. Negative for fracture or focal lesion. Sinuses/Orbits: No acute finding. Other: None. IMPRESSION: 1. No acute findings. 2. Moderate chronic ischemic microvascular disease. Old high left frontal parietal infarct. Small lacunar infarcts of the basal ganglia bilaterally. Electronically Signed   By: Elberta Fortis M.D.   On: 01/09/2023 14:58    Vitals:   01/10/23 0737 01/10/23 0737 01/10/23 0739 01/10/23 1140  BP: 131/87 131/87  (!) 151/84  Pulse: 93 93 (!) 101 (!) 106  Resp: 17 17  17   Temp: 97.7 F (36.5 C) 97.7 F (36.5 C)  (!) 97.5 F (36.4 C)  TempSrc: Axillary Oral  Oral  SpO2: 96% 96% 96% 98%  Weight:      Height:         PHYSICAL EXAM General:  Alert, well-nourished, well-developed pleasant elderly Caucasian lady.  In no acute distress Psych:  Mood and affect appropriate for situation Respiratory:  Regular, unlabored respirations on room air   NEURO:  Mental Status: Alert and oriented to person place and time.  Diminished attention, registration and recall. Speech/Language: speech is with mild dysarthria  Cranial Nerves:  II: PERRL. Visual fields full.  III, IV, VI: EOMI. Eyelids elevate symmetrically.  V: Sensation is intact to light touch and symmetrical to face.  VII:  Right-sided facial droop VIII: hearing intact to voice. IX, X: Voice is dysarthric XII: tongue is midline without fasciculations. Motor: 5/5 strength to upper and lower extremities, 2/5 strength in right upper and lower extremities Tone: is normal and bulk is normal Sensation- Intact to light touch bilaterally. Extinction absent to light touch to DSS.   Coordination: FTN intact on the left Gait- deferred   ASSESSMENT/PLAN  Acute Ischemic Infarct:  left frontal lobe infarct  Etiology: Likely cardioembolic in the setting of atrial flutter on anticoagulation CT head No acute abnormality. Small vessel disease.  Old infarcts in bilateral basal ganglia and left frontal parietal area MRI acute infarct involving high medial posterior left frontal lobe, adjacent to previous infarct, moderate atrophy and white matter disease MRA 8/2: Motion degraded, advanced bilateral M1 stenosis and no flow seen in nondominant left vertebral artery Carotid Doppler no hemodynamically significant stenosis 2D Echo 5/25: EF 60 to 65%, grade 2 diastolic dysfunction, mild mitral valve regurgitation, normal left atrial size, no atrial level shunt LDL 49 HgbA1c 7.8 VTE prophylaxis -fully anticoagulated with Eliquis Eliquis (apixaban) daily prior to admission, now on Eliquis (apixaban) daily  Therapy recommendations: Pending Disposition: Pending, likely return to SNF  Hx of Stroke/TIA Patient has a history of left frontal lobe infarct in August  Atrial fibrillation Home Meds: Eliquis 5 mg twice daily and diltiazem 360 mg daily continued in hospital Continue telemetry monitoring Will check with pharmacist to determine if anticoagulation with  Pradaxa is a possibility  Hypertension Home meds: None Stable Blood Pressure Goal: BP less than 220/110   Hyperlipidemia Home meds: Lovastatin 20 mg daily, resumed in hospital LDL 49, goal < 70 High intensity statin not indicated as LDL below goal Continue statin at  discharge  Diabetes type II Uncontrolled Home meds: None HgbA1c 7.8, goal < 7.0 CBGs SSI Recommend close follow-up with PCP for better DM control  Other Stroke Risk Factors Obesity, Body mass index is 30.54 kg/m., BMI >/= 30 associated with increased stroke risk, recommend weight loss, diet and exercise as appropriate    Other Active Problems None  Hospital day # 1  Patient seen by NP and then by MD, MD to edit note as needed. Cortney E Ernestina Columbia , MSN, AGACNP-BC [facility] See Amion for schedule and pager information 01/10/2023 12:43 PM   I have personally obtained history,examined this patient, reviewed notes, independently viewed imaging studies, participated in medical decision making and plan of care.ROS completed by me personally and pertinent positives fully documented  I have made any additions or clarifications directly to the above note. Agree with note above.  Patient presented with slurred speech and right hemiparesis due to left frontal MCA branch infarct likely of embolic etiology from A-fib despite being on anticoagulation with Eliquis.  Recommend switching Eliquis to Pradaxa if insurance company will approve without significant increase in co-pay.  No family available at the bedside.  Mobilize out of bed.  Therapy consults.  Check carotid ultrasound.  No need to repeat echocardiogram and MRI of the brain since it was done last month.  Aggressive risk factor modification.  Greater than 50% time during this 50-minute visit was spent in counseling and coordination of care about her embolic stroke and atrial fibrillation and discussion about alternatives to Eliquis and risk-benefit and answering questions Delia Heady, MD Medical Director [facility] Pager: [phone] 01/10/2023 3:25 PM  To contact Stroke Continuity provider, please refer to [email or web address]. After hours, contact General Neurology

## 2023-01-10 NOTE — Evaluation (Signed)
Occupational Therapy Evaluation Patient Details Name: [name] Petersen MRN: [id number] DOB: [birth year] Today's Date: 01/10/2023   History of Present Illness 89 yo female admitted from SNF witih R arm weakness. MRI (+) L frontal infarct PMH HTN HLD PAD DM2 HFpEF L BKA macular degeneration resident at [facility] nonambulatory   Clinical Impression   Patient evaluated by Occupational Therapy with no further acute OT needs identified. All education has been completed and the patient has no further questions. See below for any follow-up Occupational Therapy or equipment needs. OT to sign off. Thank you for referral.     Pt requires pressure relief due to decrease mobility. Pt at baseline is hoyer lift to chair with dependence for adls.     If plan is discharge home, recommend the following: Two people to help with bathing/dressing/bathroom    Functional Status Assessment  Patient has had a recent decline in their functional status and demonstrates the ability to make significant improvements in function in a reasonable and predictable amount of time.  Equipment Recommendations  [employer];Hospital bed;Wheelchair cushion (measurements OT);Wheelchair (measurements OT)    Recommendations for Other Services       Precautions / Restrictions Precautions Precautions: Fall      Mobility Bed Mobility Overal bed mobility: Needs Assistance Bed Mobility: Rolling, Supine to Sit, Sit to Supine Rolling: +2 for physical assistance, Max assist   Supine to sit: +2 for physical assistance, Max assist Sit to supine: +2 for physical assistance, Max assist   General bed mobility comments: pt nees (A) to roll toward therapist and (A) to elevate trunk from bed surface. pt pushing with L UE off bed surface toward the R. pt unable to sustain trunk in static sitting. pt with mod (A) to sit EOB. pt with (A) to bring R LE onto bed surface for return to supine with pad used to helicopter back.    Transfers                    General transfer comment: baseline w/c transfer via hoyer lift      Balance Overall balance assessment: Needs assistance Sitting-balance support: Single extremity supported, Feet supported Sitting balance-Leahy Scale: Poor                                     ADL either performed or assessed with clinical judgement   ADL Overall ADL's : Needs assistance/impaired Eating/Feeding: Maximal assistance   Grooming: Maximal assistance   Upper Body Bathing: Total assistance   Lower Body Bathing: Total assistance   Upper Body Dressing : Total assistance   Lower Body Dressing: Total assistance                 General ADL Comments: pt requries (A) for all adls due to R UE weakness and visual deficits     Vision Baseline Vision/History: 6 Macular Degeneration Ability to See in Adequate Light: 3 Highly impaired Patient Visual Report: Peripheral vision impairment Vision Assessment?: Vision impaired- to be further tested in functional context Additional Comments: pt noted to have a L gaze preference . pt with decreased oculomotor movement     Perception         Praxis         Pertinent Vitals/Pain Pain Assessment Pain Assessment: Faces Faces Pain Scale: Hurts little more Pain Location: knee from pressure on bed rail and buttock . pt noted  to have some stool on skin and report relief with hygiene / barrier cream Pain Descriptors / Indicators: Discomfort Pain Intervention(s): Monitored during session, Repositioned     Extremity/Trunk Assessment Upper Extremity Assessment Upper Extremity Assessment: Right hand dominant;RUE deficits/detail RUE Deficits / Details: demonstrates movement in all joints ( shoulder elbow wrist and hand) with visual attention and increased time. Pt decrease grasp but able to close hand completely. RUE Sensation: decreased light touch RUE Coordination: decreased fine motor;decreased gross motor   Lower  Extremity Assessment Lower Extremity Assessment: Defer to PT evaluation;RLE deficits/detail;LLE deficits/detail RLE Deficits / Details: initially not moving to command but with increased time demonstrates knee flexion and adduction at hip. pt sitting with hip abduction at baseline positioning LLE Deficits / Details: BKA   Cervical / Trunk Assessment Cervical / Trunk Assessment: Kyphotic   Communication Communication Communication: Other (comment) Cueing Techniques: Verbal cues;Tactile cues;Gestural cues   Cognition Arousal: Alert Behavior During Therapy: WFL for tasks assessed/performed Overall Cognitive Status: History of cognitive impairments - at baseline                                 General Comments: pt verbalized discomfort and her body part upon OT arrival without being cued or asked. pt immediately asking for Staff to (A) with R LE movement. Pt with caregiver private pay at bedside on arrival. Pt with delayed response to questions and needs extended time to answer     General Comments  increased risk for skin break down due to prolonged positioning in the bed. Recommendation for air mattress if continue acute admission and MD made aware.    Exercises Exercises: Other exercises Other Exercises Other Exercises: encouragement with visual attention to activate R side   Shoulder Instructions      Home Living Family/patient expects to be discharged to:: Skilled nursing facility                                 Additional Comments: pt from [facility] at this time. Pt has a private pay sitting Pam in the room.      Prior Functioning/Environment Prior Level of Function : Needs assist  Cognitive Assist : Mobility (cognitive);ADLs (cognitive) Mobility (Cognitive): Step by step cues   Physical Assist : Mobility (physical);ADLs (physical) Mobility (physical): Bed mobility;Transfers ADLs (physical):  Feeding;Grooming;Bathing;IADLs;Toileting;Dressing Mobility Comments: dependent on hoyer lift to w/c ADLs Comments: assist for all adls        OT Problem List:        OT Treatment/Interventions:      OT Goals(Current goals can be found in the care plan section) Acute Rehab OT Goals Patient Stated Goal: to lay back down OT Goal Formulation: Patient unable to participate in goal setting  OT Frequency:      Co-evaluation PT/OT/SLP Co-Evaluation/Treatment: Yes Reason for Co-Treatment: Necessary to address cognition/behavior during functional activity;For patient/therapist safety;To address functional/ADL transfers   OT goals addressed during session: ADL's and self-care;Proper use of Adaptive equipment and DME;Strengthening/ROM      AM-PAC OT "6 Clicks" Daily Activity     Outcome Measure Help from another person eating meals?: A Lot Help from another person taking care of personal grooming?: A Lot Help from another person toileting, which includes using toliet, bedpan, or urinal?: Total Help from another person bathing (including washing, rinsing, drying)?: Total Help from another person  to put on and taking off regular upper body clothing?: Total Help from another person to put on and taking off regular lower body clothing?: Total 6 Click Score: 8   End of Session Nurse Communication: Mobility status;Weight bearing status;Precautions;Need for lift equipment  Activity Tolerance: Patient tolerated treatment well Patient left: in bed;with call bell/phone within reach;with bed alarm set;with family/visitor present  OT Visit Diagnosis: Muscle weakness (generalized) (M62.81)                Time: 1610-9604 OT Time Calculation (min): 23 min Charges:  OT General Charges $OT Visit: 1 Visit OT Evaluation $OT Eval Moderate Complexity: 1 Mod   Brynn, OTR/L  Acute Rehabilitation Services Office: [phone] .   Mateo Flow 01/10/2023, 1:53 PM

## 2023-01-10 NOTE — Plan of Care (Signed)
  Problem: Education: Goal: Ability to describe self-care measures that may prevent or decrease complications (Diabetes Survival Skills Education) will improve Outcome: Progressing Goal: Individualized Educational Video(s) Outcome: Progressing   Problem: Coping: Goal: Ability to adjust to condition or change in health will improve Outcome: Progressing   Problem: Fluid Volume: Goal: Ability to maintain a balanced intake and output will improve Outcome: Progressing   Problem: Health Behavior/Discharge Planning: Goal: Ability to identify and utilize available resources and services will improve Outcome: Progressing Goal: Ability to manage health-related needs will improve Outcome: Progressing   Problem: Nutritional: Goal: Maintenance of adequate nutrition will improve Outcome: Progressing Goal: Progress toward achieving an optimal weight will improve Outcome: Progressing   Problem: Skin Integrity: Goal: Risk for impaired skin integrity will decrease Outcome: Progressing   Problem: Tissue Perfusion: Goal: Adequacy of tissue perfusion will improve Outcome: Progressing   Problem: Education: Goal: Knowledge of disease or condition will improve Outcome: Progressing Goal: Knowledge of secondary prevention will improve (MUST DOCUMENT ALL) Outcome: Progressing Goal: Knowledge of patient specific risk factors will improve Loraine Leriche N/A or DELETE if not current risk factor) Outcome: Progressing

## 2023-01-10 NOTE — Telephone Encounter (Signed)
Pharmacy Patient Advocate Encounter  Received notification from [facility] that Prior Authorization for  Dabigatran Etexilate Mesylate 75MG  capsules  has been APPROVED from 01/10/2023 to 04/19/2023. Ran test claim, Copay is $100.00. This test claim was processed through [facility]- copay amounts may vary at other pharmacies due to pharmacy/plan contracts, or as the patient moves through the different stages of their insurance plan.   PA #/Case ID/Reference #: [id number]

## 2023-01-10 NOTE — TOC Initial Note (Signed)
Transition of Care [facility]) - Initial/Assessment Note    Patient Details  Name: [name] Petersen MRN: [id number] Date of Birth: [birth year]  Transition of Care [facility]) CM/SW Contact:    Baldemar Lenis, LCSW Phone Number: 01/10/2023, 3:53 PM  Clinical Narrative:        Patient from [facility] LTC, can return at discharge. CSW attempted to reach daughter, [name], to confirm plan to return to SNF, left a voicemail; awaiting call back. CSW to follow.           Expected Discharge Plan: Skilled Nursing Facility Barriers to Discharge: Continued Medical Work up   Patient Goals and CMS Choice Patient states their goals for this hospitalization and ongoing recovery are:: patient unable to participate in goal setting, intermittent confusion and not fully oriented CMS Medicare.gov Compare Post Acute Care list provided to:: Patient Represenative (must comment) Choice offered to / list presented to : Adult Children [place] ownership interest in [facility].provided to:: Adult Children    Expected Discharge Plan and Services     Post Acute Care Choice: Skilled Nursing Facility Living arrangements for the past 2 months: Skilled Nursing Facility                                      Prior Living Arrangements/Services Living arrangements for the past 2 months: Skilled Nursing Facility Lives with:: Facility Resident Patient language and need for interpreter reviewed:: No Do you feel safe going back to the place where you live?: Yes      Need for Family Participation in Patient Care: Yes (Comment) Care giver support system in place?: Yes (comment)   Criminal Activity/Legal Involvement Pertinent to Current Situation/Hospitalization: No - Comment as needed  Activities of Daily Living      Permission Sought/Granted Permission sought to share information with : Facility Medical sales representative, Family Supports Permission granted to share information with : Yes, Verbal  Permission Granted  Share Information with NAME: Myrna Blazer  Permission granted to share info w AGENCY: [facility]  Permission granted to share info w Relationship: Daughters     Emotional Assessment   Attitude/Demeanor/Rapport: Unable to Assess Affect (typically observed): Unable to Assess Orientation: : Oriented to Self, Oriented to Place, Oriented to  Time, Oriented to Situation, Fluctuating Orientation (Suspected and/or reported Sundowners) Alcohol / Substance Use: Not Applicable Psych Involvement: No (comment)  Admission diagnosis:  Acute CVA (cerebrovascular accident) (HCC) [I63.9] Cerebrovascular accident (CVA), unspecified mechanism (HCC) [I63.9] Anemia, unspecified type [D64.9] Patient Active Problem List   Diagnosis Date Noted   Anemia 01/10/2023   Cerebrovascular accident (CVA) (HCC) 01/10/2023   Pressure injury of skin 01/10/2023   Acute CVA (cerebrovascular accident) (HCC) 01/09/2023   Acute renal failure superimposed on stage 3b chronic kidney disease (HCC) 01/09/2023   Acute ischemic stroke (HCC) 11/23/2022   Atrial flutter (HCC) 11/10/2022   Acute diastolic (congestive) heart failure (HCC) 11/09/2022   Atrial fibrillation with RVR (HCC) 09/11/2022   History of stroke 08/21/2020   Hyperlipidemia 08/21/2020   Urinary retention    Phantom limb pain (HCC)    Unilateral complete BKA, left, sequela (HCC)    Slow transit constipation    Atherosclerotic PVD with ulceration (HCC) 07/14/2017   Pressure injury of right heel, stage 3 (HCC)    Sleep disturbance    Poor nutrition    Hypokalemia    Drug-induced constipation    Essential  hypertension    Stage 3 chronic kidney disease (HCC)    Postoperative pain    Type 2 diabetes mellitus with obesity (HCC)    S/P below knee amputation, left (HCC) 07/10/2017   PAD (peripheral artery disease) (HCC) 07/06/2017   Atherosclerosis of native arteries of the extremities with ulceration (HCC) 06/16/2017   PCP:  Pcp,  No Pharmacy:   [facility] - [place], [place] - [address] AT [address] [place] [address] Phone: [phone] Fax: [phone]  [facility] - [place], [place] - [place] [address] [place] [address] Phone: [phone] Fax: [phone]     Social Determinants of Health (SDOH) Social History: SDOH Screenings   Food Insecurity: No Food Insecurity (11/17/2022)  Housing: Patient Declined (11/17/2022)  Transportation Needs: No Transportation Needs (11/17/2022)  Utilities: Not At Risk (11/17/2022)  Financial Resource Strain: Low Risk  (06/29/2022)   Received from [facility]  Physical Activity: Inactive (06/29/2022)   Received from [facility], [facility]  Social Connections: Moderately Integrated (06/29/2022)   Received from [facility]  Stress: No Stress Concern Present (06/29/2022)   Received from [facility], [facility]  Tobacco Use: Low Risk  (11/17/2022)  Health Literacy: Low Risk  (06/29/2022)   Received from [facility]   SDOH Interventions:     Readmission Risk Interventions    11/19/2022    3:12 PM 08/26/2020   11:01 AM  Readmission Risk Prevention Plan  Transportation Screening Complete Complete  PCP or Specialist Appt within 5-7 Days Not Complete   Home Care Screening Complete Complete  Medication Review (RN CM) Complete Complete

## 2023-01-10 NOTE — Progress Notes (Signed)
PROGRESS NOTE  KARYNA DUPLECHIN    DOB: 02/15/1927, 89 y.o.  QIO:[id number]    Code Status: Do not attempt resuscitation (DNR) - Comfort care   DOA: 01/09/2023   LOS: 1   Brief hospital course  IVELIS MIMBS is a 89 y.o. female with a PMH significant for HTN, HLD, PAD, T2DM, HFpEF, L BKA, recent CVA.   Patient is a resident of [facility]. Noticed today by visiting family member to not be using her R arm and having dysarthria so EMS was called.  The patient was recently admitted 7/21-8/6 with A-fib with RVR and a CVA.  She was discharged on Eliquis and 81 mg ASA p.o. daily.  Patient has been bedbound since since her stroke.   In the ER patient's vital stable.  Hgb 9, prior hgb 12 (11/22/22), Cr1.42 prior Cr 1.15 (11/21/22) CT head without evidence for acute CVA.   MRI head shows acute nonhemorrhagic infarct involving the high medial posterior left frontal lobe, adjacent to the previous infarct. EKG atrial flutter HR 66.    They were initially treated with home medications and mIVF.  Neurology consulted by EDP.    Patient was admitted to medicine service for further workup and management of CVA as outlined in detail below.  01/10/23 -stable  Assessment & Plan  Active Problems:   PAD (peripheral artery disease) (HCC)   Essential hypertension   Type 2 diabetes mellitus with obesity (HCC)   Phantom limb pain (HCC)   History of stroke   Hyperlipidemia   Atrial flutter (HCC)   Acute ischemic stroke (HCC)   Acute renal failure superimposed on stage 3b chronic kidney disease (HCC)  acute nonhemorrhagic infarct- involving the high medial posterior left frontal lobe -neurology following, appreciate your recs -Will continue Eliquis -Neurochecks, PT/OT/speech consult -No need for permissive hypertension -2D echo, MRA head/neck not repeated -Statin not recommended   Anemia -No clear evidence of report of bleeding. Hgb stable 9.0>9.3 -CBC am   AKI on chronic kidney disease , stage 3b.  Baseline Cr around 1.1. Today is 1.4 -Gentle IV fluid hydration.  BMP in a.m.   Essential hypertension  Afib RVR- rate controlled. BP within goal range for age -Cardizem resumed -Continue amiodarone, Eliquis     Type 2 diabetes mellitus with obesity (HCC)- A1c pending -Sliding scale insulin ordered    HFpEF -Lasix p.o. daily held with AKI.  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   Body mass index is 30.54 kg/m.  VTE ppx:  apixaban (ELIQUIS) tablet 5 mg   Diet:     Diet   Diet regular Room service appropriate? Yes; Fluid consistency: Thin   Consultants: Neurology   Subjective 01/10/23    Pt reports no complaints today. She states she is unable to lift R leg.    Objective   Vitals:   01/09/23 2125 01/09/23 2253 01/10/23 0439 01/10/23 0737  BP: 127/78 (!) 150/90 124/81 131/87  Pulse:  96 (!) 106 93  Resp:  18 18 17   Temp:  97.8 F (36.6 C) 97.6 F (36.4 C) 97.7 F (36.5 C)  TempSrc:  Axillary Axillary Axillary  SpO2:  98% 96% 96%  Weight:      Height:        Intake/Output Summary (Last 24 hours) at 01/10/2023 0809 Last data filed at 01/10/2023 0400 Gross per 24 hour  Intake 493.75 ml  Output --  Net 493.75 ml  Filed Weights   01/09/23 1352  Weight: 75.8 kg     Physical Exam:  General: awake, alert, NAD HEENT: atraumatic, clear conjunctiva, anicteric sclera, MMM, hearing grossly normal Respiratory: normal respiratory effort. CTAB Cardiovascular: quick capillary refill, RRR Gastrointestinal: soft, NT, ND Nervous: A&O x2. Unable to lift R leg against resistance. L leg BKA appropriate strength for age/condition. Follows commands, answers questions appropriately without dysarthria.  Skin: dry, intact, normal temperature, normal color. No rashes, lesions or ulcers on exposed skin Psychiatry: flat mood, congruent affect  Labs   I have personally reviewed the following labs and imaging studies CBC     Component Value Date/Time   WBC 6.2 01/10/2023 0715   RBC 3.78 (L) 01/10/2023 0715   HGB 9.3 (L) 01/10/2023 0715   HCT 32.3 (L) 01/10/2023 0715   PLT 281 01/10/2023 0715   MCV 85.4 01/10/2023 0715   MCH 24.6 (L) 01/10/2023 0715   MCHC 28.8 (L) 01/10/2023 0715   RDW 15.2 01/10/2023 0715   LYMPHSABS 1.2 01/09/2023 1353   MONOABS 0.7 01/09/2023 1353   EOSABS 0.1 01/09/2023 1353   BASOSABS 0.0 01/09/2023 1353      Latest Ref Rng & Units 01/09/2023    1:53 PM 11/21/2022   10:56 AM 11/19/2022   12:02 AM  BMP  Glucose 70 - 99 mg/dL 644  034  742   BUN 8 - 23 mg/dL 22  19  14    Creatinine 0.44 - 1.00 mg/dL 5.95  6.38  7.56   Sodium 135 - 145 mmol/L 139  134  135   Potassium 3.5 - 5.1 mmol/L 3.8  3.9  4.1   Chloride 98 - 111 mmol/L 102  97  100   CO2 22 - 32 mmol/L 26  28  26    Calcium 8.9 - 10.3 mg/dL 8.8  8.8  9.1     MR Brain Wo Contrast (neuro protocol)  Result Date: 01/09/2023 CLINICAL DATA:  Neuro deficit, acute, stroke suspected. Weakness. Altered mental status. Decreased use of the right upper extremity EXAM: MRI HEAD WITHOUT CONTRAST TECHNIQUE: Multiplanar, multiecho pulse sequences of the brain and surrounding structures were obtained without intravenous contrast. COMPARISON:  CT head without contrast 01/09/2023. MR head without contrast 11/19/2022. FINDINGS: Brain: Acute nonhemorrhagic infarct is present in the high medial posterior left frontal lobe, adjacent to the previous infarct. T2 and FLAIR hyperintensities are associated. Moderate atrophy and diffuse white matter changes are otherwise stable. Remote lacunar infarct is present in the right corona radiata. Dilated perivascular spaces are present throughout the basal ganglia. The ventricles are proportionate to the degree of atrophy. No significant extraaxial fluid collection is present. The brainstem and cerebellum are within normal limits. Midline structures are within normal limits. Vascular: Flow is present in the major  intracranial arteries. Skull and upper cervical spine: Mild degenerative anterolisthesis is present at C3-4 C4-5 and C5-6. Craniocervical junction is normal. Sinuses/Orbits: The paranasal sinuses and mastoid air cells are clear. Bilateral lens replacements are noted. Globes and orbits are otherwise unremarkable. IMPRESSION: 1. Acute nonhemorrhagic infarct involving the high medial posterior left frontal lobe, adjacent to the previous infarct. 2. Moderate atrophy and diffuse white matter disease is otherwise stable. This likely reflects the sequela of chronic microvascular ischemia. Electronically Signed   By: Marin Roberts M.D.   On: 01/09/2023 17:21   DG Chest Port 1 View  Result Date: 01/09/2023 CLINICAL DATA:  Shortness of breath.  Weakness. EXAM: PORTABLE CHEST 1 VIEW COMPARISON:  One-view chest x-ray  11/17/2022 FINDINGS: The heart is enlarged. Atherosclerotic changes are present at the aortic arch. Lung volumes are low. No edema or effusion is present. The visualized soft tissues and bony thorax are unremarkable. IMPRESSION: 1. Cardiomegaly without failure. 2. Low lung volumes. Electronically Signed   By: Marin Roberts M.D.   On: 01/09/2023 15:49   CT Head Wo Contrast  Result Date: 01/09/2023 CLINICAL DATA:  Neurologic deficit. EXAM: CT HEAD WITHOUT CONTRAST TECHNIQUE: Contiguous axial images were obtained from the base of the skull through the vertex without intravenous contrast. RADIATION DOSE REDUCTION: This exam was performed according to the departmental dose-optimization program which includes automated exposure control, adjustment of the mA and/or kV according to patient size and/or use of iterative reconstruction technique. COMPARISON:  CT 11/18/2022, MRI 11/19/2022 FINDINGS: Brain: Ventricles, cisterns and other CSF spaces are within normal relative patient's age. There is moderate chronic ischemic microvascular disease. Old high left frontal parietal infarct as seen on previous  MRI. Small lacunar infarcts of the basal ganglia bilaterally. No mass, mass effect, shift of midline structures or acute hemorrhage. No evidence of acute infarction. Vascular: No hyperdense vessel or unexpected calcification. Skull: Normal. Negative for fracture or focal lesion. Sinuses/Orbits: No acute finding. Other: None. IMPRESSION: 1. No acute findings. 2. Moderate chronic ischemic microvascular disease. Old high left frontal parietal infarct. Small lacunar infarcts of the basal ganglia bilaterally. Electronically Signed   By: Elberta Fortis M.D.   On: 01/09/2023 14:58    Disposition Plan & Communication  Patient status: Inpatient  Admitted From: SNF Planned disposition location: Skilled nursing facility Anticipated discharge date: 9/24 pending clinical improvement  Family Communication: none at bedside    Author: Leeroy Bock, DO [facility] 01/10/2023, 8:09 AM   Available by Epic secure chat 7AM-7PM. If 7PM-7AM, please contact night-coverage.  [facility] contact information found on [email or web address].

## 2023-01-10 NOTE — Evaluation (Signed)
Speech Language Pathology Evaluation Patient Details Name: [name] Petersen MRN: [id number] DOB: 1927-02-13 Today's Date: 01/10/2023 Time: 4403-4742 SLP Time Calculation (min) (ACUTE ONLY): 18 min  Problem List:  Patient Active Problem List   Diagnosis Date Noted   Anemia 01/10/2023   Cerebrovascular accident (CVA) (HCC) 01/10/2023   Pressure injury of skin 01/10/2023   Acute CVA (cerebrovascular accident) (HCC) 01/09/2023   Acute renal failure superimposed on stage 3b chronic kidney disease (HCC) 01/09/2023   Acute ischemic stroke (HCC) 11/23/2022   Atrial flutter (HCC) 11/10/2022   Acute diastolic (congestive) heart failure (HCC) 11/09/2022   Atrial fibrillation with RVR (HCC) 09/11/2022   History of stroke 08/21/2020   Hyperlipidemia 08/21/2020   Urinary retention    Phantom limb pain (HCC)    Unilateral complete BKA, left, sequela (HCC)    Slow transit constipation    Atherosclerotic PVD with ulceration (HCC) 07/14/2017   Pressure injury of right heel, stage 3 (HCC)    Sleep disturbance    Poor nutrition    Hypokalemia    Drug-induced constipation    Essential hypertension    Stage 3 chronic kidney disease (HCC)    Postoperative pain    Type 2 diabetes mellitus with obesity (HCC)    S/P below knee amputation, left (HCC) 07/10/2017   PAD (peripheral artery disease) (HCC) 07/06/2017   Atherosclerosis of native arteries of the extremities with ulceration (HCC) 06/16/2017   Past Medical History:  Past Medical History:  Diagnosis Date   A-fib (HCC)    Arthritis    hands   Atherosclerotic PVD with ulceration (HCC) 07/15/2017   RIGHT HEEL   Breast cancer (HCC)    Cancer (HCC)    Chronic heart failure with preserved ejection fraction (HFpEF) (HCC)    Complication of anesthesia    lost eyesight for a few days after surgery in the 1950's   Constipation    Diabetes mellitus without complication (HCC)    no medications    Hypertension    Macular degeneration, bilateral     "one eye wet; one eye dry; she gets shots" (07/07/2017)   PAD (peripheral artery disease) (HCC)    Pneumonia    Stroke (HCC)    some trouble swallowing   Past Surgical History:  Past Surgical History:  Procedure Laterality Date   ABDOMINAL AORTOGRAM W/LOWER EXTREMITY N/A 06/16/2017   Procedure: ABDOMINAL AORTOGRAM W/LOWER EXTREMITY;  Surgeon: Fransisco Hertz, MD;  Location[facility] INVASIVE CV LAB;  Service: Cardiovascular;  Laterality: N/A;  lt extermity   ABDOMINAL HYSTERECTOMY     AMPUTATION Left 07/06/2017   Procedure: AMPUTATION BELOW KNEE LEFT;  Surgeon: Larina Earthly, MD;  Location: [facility] OR;  Service: Vascular;  Laterality: Left;   CATARACT EXTRACTION W/ INTRAOCULAR LENS  IMPLANT, BILATERAL     DILATION AND CURETTAGE OF UTERUS     LOWER EXTREMITY ANGIOGRAPHY N/A 07/14/2017   Procedure: LOWER EXTREMITY ANGIOGRAPHY;  Surgeon: Chuck Hint, MD;  [facility] INVASIVE CV LAB;  Service: Cardiovascular;  Laterality: N/A;   HPI:  [name] is a 89 yo female presenting to ED 9/22 with speech difficulties and R sided weakness. Recently admitted 7/21-8/6 with A-fib RVR and CVA, d/c on Eliquis and "bed bound". MRI Brain with acute nonhemorrhagic infarct involving high R medial posterior L frontal lobe. PMH includes HTN, HLD, PAD, T2DM, HFpEF, recent CVA, macular degeneration   Assessment / Plan / Recommendation Clinical Impression  Pt reports acute concerns with word finding, attention, and  memory. Pt presents with R sided weakness, likely contributing to dysarthria and resulting reduced intelligibility at the conversation level. She appears to have awareness of R side deficits, although does not demonstrate awareness or insight into how to compensate. Pt initially reports the year is 1930, before making attempts to correct to 2010 and "43". Pt successful with following basic one and two step commands, although states she is unable to reach her nose to complete a more complex command as she  scratches her eye with her L hand. When asked to participate in a divergent naming task, she initially was perseverative on the word "apple", repeating it four times before this SLP provided a verbal cue and she subsequently named 10 animals in one minute. Pt successful with word and phrase repetition, although with phonemic paraphasias at the sentence level. She was 100% accurate with yes/no questions. Pt correctly answered 3/4 questions after a paragraph read aloud. At the conversation level, pt with increased signs of expressive language difficutly, although this is overall mild in nature. Suspect pt is experiencing acute on chronic cognitive linguistic deficits and may benefit from ongoing SLP f/u to target deficits listed above which can be addressed at pt's next venue of care.    SLP Assessment  SLP Recommendation/Assessment: All further Speech Lanaguage Pathology  needs can be addressed in the next venue of care SLP Visit Diagnosis: Aphasia (R47.01);Dysarthria and anarthria (R47.1);Cognitive communication deficit (R41.841)    Recommendations for follow up therapy are one component of a multi-disciplinary discharge planning process, led by the attending physician.  Recommendations may be updated based on patient status, additional functional criteria and insurance authorization.    Follow Up Recommendations  Skilled nursing-short term rehab (<3 hours/day)    Assistance Recommended at Discharge  Frequent or constant Supervision/Assistance  Functional Status Assessment Patient has had a recent decline in their functional status and demonstrates the ability to make significant improvements in function in a reasonable and predictable amount of time.  Frequency and Duration           SLP Evaluation Cognition  Overall Cognitive Status: History of cognitive impairments - at baseline Year: Other (Comment) (1930, 2010, 43) Attention: Sustained;Selective Sustained Attention: Appears  intact Selective Attention: Impaired Selective Attention Impairment: Verbal basic Memory: Impaired Memory Impairment: Storage deficit;Retrieval deficit Awareness: Impaired Awareness Impairment: Intellectual impairment Problem Solving: Impaired Problem Solving Impairment: Verbal basic       Comprehension  Auditory Comprehension Overall Auditory Comprehension: Appears within functional limits for tasks assessed    Expression Expression Primary Mode of Expression: Verbal Verbal Expression Overall Verbal Expression: Impaired Repetition: Impaired Level of Impairment: Sentence level Naming: Impairment Divergent: 25-49% accurate   Oral / Motor  Oral Motor/Sensory Function Overall Oral Motor/Sensory Function: Mild impairment Facial ROM: Reduced right Facial Symmetry: Abnormal symmetry right Facial Strength: Reduced right Motor Speech Overall Motor Speech: Impaired Respiration: Within functional limits Phonation: Normal Resonance: Within functional limits Articulation: Impaired Level of Impairment: Conversation Intelligibility: Intelligibility reduced Conversation: 75-100% accurate            Gwynneth Aliment, M.A., CF-SLP Speech Language Pathology, Acute Rehabilitation Services  Secure Chat preferred [phone]  01/10/2023, 3:44 PM

## 2023-01-10 NOTE — Telephone Encounter (Signed)
Pharmacy Patient Advocate Encounter   Received notification that prior authorization for Dabigatran Etexilate Mesylate 75MG  capsules is required/requested.   Insurance verification completed.   The patient is insured through [facility] .   Per test claim: PA required; PA submitted to [facility] via CoverMyMeds Key/confirmation #/EOC [facility] Status is pending

## 2023-01-11 DIAGNOSIS — D649 Anemia, unspecified: Secondary | ICD-10-CM | POA: Diagnosis not present

## 2023-01-11 DIAGNOSIS — I639 Cerebral infarction, unspecified: Secondary | ICD-10-CM | POA: Diagnosis not present

## 2023-01-11 LAB — CBC
HCT: 31.1 % — ABNORMAL LOW (ref 36.0–46.0)
Hemoglobin: 9.6 g/dL — ABNORMAL LOW (ref 12.0–15.0)
MCH: 25.8 pg — ABNORMAL LOW (ref 26.0–34.0)
MCHC: 30.9 g/dL (ref 30.0–36.0)
MCV: 83.6 fL (ref 80.0–100.0)
Platelets: 307 10*3/uL (ref 150–400)
RBC: 3.72 MIL/uL — ABNORMAL LOW (ref 3.87–5.11)
RDW: 15.3 % (ref 11.5–15.5)
WBC: 8.6 10*3/uL (ref 4.0–10.5)
nRBC: 0 % (ref 0.0–0.2)

## 2023-01-11 LAB — GLUCOSE, CAPILLARY
Glucose-Capillary: 141 mg/dL — ABNORMAL HIGH (ref 70–99)
Glucose-Capillary: 127 mg/dL — ABNORMAL HIGH (ref 70–99)

## 2023-01-11 LAB — BASIC METABOLIC PANEL
Anion gap: 13 (ref 5–15)
BUN: 20 mg/dL (ref 8–23)
CO2: 23 mmol/L (ref 22–32)
Calcium: 9 mg/dL (ref 8.9–10.3)
Chloride: 100 mmol/L (ref 98–111)
Creatinine, Ser: 1.41 mg/dL — ABNORMAL HIGH (ref 0.44–1.00)
GFR, Estimated: 35 mL/min — ABNORMAL LOW (ref >60)
Glucose, Bld: 148 mg/dL — ABNORMAL HIGH (ref 70–99)
Potassium: 3.8 mmol/L (ref 3.5–5.1)
Sodium: 136 mmol/L (ref 135–145)

## 2023-01-11 MED ORDER — AMIODARONE HCL 200 MG PO TABS: 200.0000 mg | ORAL_TABLET | Freq: Every day | ORAL | Status: AC

## 2023-01-11 NOTE — Progress Notes (Signed)
STROKE TEAM PROGRESS NOTE   BRIEF HPI Ms. [name] Petersen is a 89 y.o. female with history of hypertension, hyperlipidemia, PAD with left BKA, diabetes, A-flutter on Eliquis, CHF and recent stroke in July presenting with right-sided weakness and dysarthria.  MRI brain demonstrates new infarct in high medial posterior left frontal lobe.  INTERIM HISTORY/SUBJECTIVE Patient sitting up comfortably in bed.  Her caregivers are at the bedside.  She still has slight right facial droop and dysarthria.  She also has notable right arm and leg weakness.  Patient is planned to return to SNF today.  Eliquis has been restarted   OBJECTIVE  CBC    Component Value Date/Time   WBC 8.6 01/11/2023 0550   RBC 3.72 (L) 01/11/2023 0550   HGB 9.6 (L) 01/11/2023 0550   HCT 31.1 (L) 01/11/2023 0550   PLT 307 01/11/2023 0550   MCV 83.6 01/11/2023 0550   MCH 25.8 (L) 01/11/2023 0550   MCHC 30.9 01/11/2023 0550   RDW 15.3 01/11/2023 0550   LYMPHSABS 1.2 01/09/2023 1353   MONOABS 0.7 01/09/2023 1353   EOSABS 0.1 01/09/2023 1353   BASOSABS 0.0 01/09/2023 1353    BMET    Component Value Date/Time   NA 136 01/11/2023 0550   K 3.8 01/11/2023 0550   CL 100 01/11/2023 0550   CO2 23 01/11/2023 0550   GLUCOSE 148 (H) 01/11/2023 0550   BUN 20 01/11/2023 0550   CREATININE 1.41 (H) 01/11/2023 0550   CALCIUM 9.0 01/11/2023 0550   GFRNONAA 35 (L) 01/11/2023 0550    IMAGING past 24 hours No results found.  Vitals:   01/11/23 0053 01/11/23 0339 01/11/23 0738 01/11/23 1120  BP: (!) 143/99 (!) 138/90 (!) 147/94 (!) 145/89  Pulse: 99 (!) 101 100 93  Resp: 18 16 18 18   Temp: 98.2 F (36.8 C) (!) 97.5 F (36.4 C) 97.6 F (36.4 C) 98.5 F (36.9 C)  TempSrc: Oral Oral Oral Oral  SpO2: 96% 96% 93% 97%  Weight:      Height:         PHYSICAL EXAM General:  Alert, well-nourished, well-developed pleasant elderly Caucasian lady.  In no acute distress Psych:  Mood and affect appropriate for  situation Respiratory:  Regular, unlabored respirations on room air   NEURO:  Mental Status: Alert and oriented to person place and time.  Diminished attention, registration and recall. Speech/Language: speech is with mild dysarthria  Cranial Nerves:  II: PERRL. Visual fields full.  III, IV, VI: EOMI. Eyelids elevate symmetrically.  V: Sensation is intact to light touch and symmetrical to face.  VII: Right-sided facial droop VIII: hearing intact to voice. IX, X: Voice is dysarthric XII: tongue is midline without fasciculations. Motor: 5/5 strength to upper and lower extremities, 2/5 strength in right upper and lower extremities Tone: is normal and bulk is normal Sensation- Intact to light touch bilaterally. Extinction absent to light touch to DSS.   Coordination: FTN intact on the left Gait- deferred   ASSESSMENT/PLAN  Acute Ischemic Infarct:  left frontal lobe infarct  Etiology: Likely cardioembolic in the setting of atrial flutter on anticoagulation CT head No acute abnormality. Small vessel disease.  Old infarcts in bilateral basal ganglia and left frontal parietal area MRI acute infarct involving high medial posterior left frontal lobe, adjacent to previous infarct, moderate atrophy and white matter disease MRA 8/2: Motion degraded, advanced bilateral M1 stenosis and no flow seen in nondominant left vertebral artery Carotid Doppler no hemodynamically significant stenosis 2D  Echo 5/25: EF 60 to 65%, grade 2 diastolic dysfunction, mild mitral valve regurgitation, normal left atrial size, no atrial level shunt LDL 49 HgbA1c 7.8 VTE prophylaxis -fully anticoagulated with Eliquis Eliquis (apixaban) daily prior to admission, now on Eliquis (apixaban) daily  Therapy recommendations: Pending Disposition: Pending, likely return to SNF  Hx of Stroke/TIA Patient has a history of left frontal lobe infarct in August  Atrial fibrillation Home Meds: Eliquis 5 mg twice daily and  diltiazem 360 mg daily continued in hospital Continue telemetry monitoring Will check with pharmacist to determine if anticoagulation with Pradaxa is a possibility  Hypertension Home meds: None Stable Blood Pressure Goal: BP less than 220/110   Hyperlipidemia Home meds: Lovastatin 20 mg daily, resumed in hospital LDL 49, goal < 70 High intensity statin not indicated as LDL below goal Continue statin at discharge  Diabetes type II Uncontrolled Home meds: None HgbA1c 7.8, goal < 7.0 CBGs SSI Recommend close follow-up with PCP for better DM control  Other Stroke Risk Factors Obesity, Body mass index is 30.54 kg/m., BMI >/= 30 associated with increased stroke risk, recommend weight loss, diet and exercise as appropriate    Other Active Problems None  Hospital day # 2  Continue Eliquis for stroke prevention and aggressive risk factor modification.  Greater than 50% time during this visit was spent in counseling and coordination of care about her embolic stroke and atrial fibrillation and discussion about alternatives to Eliquis and risk-benefit and answering questions Delia Heady, MD Medical Director [facility] Pager: [phone] 01/11/2023 2:37 PM  To contact Stroke Continuity provider, please refer to [email or web address]. After hours, contact General Neurology

## 2023-01-11 NOTE — TOC Transition Note (Signed)
Transition of Care [facility]) - CM/SW Discharge Note   Patient Details  Name: [name] Petersen MRN: [id number] Date of Birth: 10/12/1926  [facility] of [facility]) CM/SW Contact:  Baldemar Lenis, LCSW Phone Number: 01/11/2023, 1:20 PM   Clinical Narrative:   CSW notified by MD that patient stable to return to SNF. CSW updated Malvin Johns, sent discharge paperwork. [facility] ready to accept patient back. Transport arranged with [facility] for next available. RN to update daughter, Elita Quick, once Sharin Mons arrives. No further TOC needs.  Nurse to call report to [phone], Room 803A.    Final next level of care: Skilled Nursing Facility Barriers to Discharge: Barriers Resolved   Patient Goals and CMS Choice CMS Medicare.gov Compare Post Acute Care list provided to:: Patient Represenative (must comment) Choice offered to / list presented to : Adult Children  Discharge Placement                Patient chooses bed at: [facility] Patient to be transferred to facility by: [facility] Name of family member notified: Pam Patient and family notified of of transfer: 01/11/23  Discharge Plan and Services Additional resources added to the After Visit Summary for       Post Acute Care Choice: Skilled Nursing Facility                               Social Determinants of Health (SDOH) Interventions SDOH Screenings   Food Insecurity: No Food Insecurity (11/17/2022)  Housing: Patient Declined (11/17/2022)  Transportation Needs: No Transportation Needs (11/17/2022)  Utilities: Not At Risk (11/17/2022)  Financial Resource Strain: Low Risk  (06/29/2022)   Received from [facility], [facility]  Physical Activity: Inactive (06/29/2022)   Received from [facility], [facility]  Social Connections: Moderately Integrated (06/29/2022)   Received from [facility], [facility]  Stress: No Stress Concern Present (06/29/2022)   Received from [facility], [facility]  Tobacco Use:  Low Risk  (11/17/2022)  Health Literacy: Low Risk  (06/29/2022)   Received from [facility], [facility]     Readmission Risk Interventions    11/19/2022    3:12 PM 08/26/2020   11:01 AM  Readmission Risk Prevention Plan  Transportation Screening Complete Complete  PCP or Specialist Appt within 5-7 Days Not Complete   Home Care Screening Complete Complete  Medication Review (RN CM) Complete Complete

## 2023-01-11 NOTE — Discharge Summary (Signed)
Physician Discharge Summary  Patient: [name] Petersen ZOX:[id number] DOB: 1926/07/27   Code Status: Do not attempt resuscitation (DNR) - Comfort care Admit date: 01/09/2023 Discharge date: 01/11/2023 Disposition: Skilled nursing facility, PT, OT, SLP, nurse aid, RN, and No home health services recommended PCP: Pcp, No  Recommendations for Outpatient Follow-up:  Follow up with PCP within 1-2 weeks Regarding general hospital follow up and preventative care Recommend considering transitioning eliquis to pradaxa if insurance would approve.  Follow up with neurology  Discharge Diagnoses:  Active Problems:   PAD (peripheral artery disease) (HCC)   Essential hypertension   Type 2 diabetes mellitus with obesity (HCC)   Phantom limb pain (HCC)   History of stroke   Hyperlipidemia   Atrial flutter (HCC)   Acute ischemic stroke (HCC)   Acute renal failure superimposed on stage 3b chronic kidney disease (HCC)   Anemia   Cerebrovascular accident (CVA) (HCC)   Pressure injury of skin  Brief Hospital Course Summary: [name] Petersen is a 89 y.o. female with a PMH significant for HTN, HLD, PAD, T2DM, HFpEF, L BKA, recent CVA.   Patient is a resident of [facility]. Noticed today by visiting family member to not be using her R arm and having dysarthria so EMS was called.  The patient was recently admitted 7/21-8/6 with A-fib with RVR and a CVA.  She was discharged on Eliquis and 81 mg ASA p.o. daily.  Patient has been bedbound since since her stroke.   In the ER patient's vital stable.  Hgb 9, prior hgb 12 (11/22/22), Cr1.42 prior Cr 1.15 (11/21/22) CT head without evidence for acute CVA.   MRI head shows acute nonhemorrhagic infarct involving the high medial posterior left frontal lobe, adjacent to the previous infarct. EKG atrial flutter HR 66.     They were initially treated with home medications and mIVF.  Neurology consulted by EDP.     Patient was admitted to medicine service for further  workup and management of CVA as outlined in detail below.  Patient suffered from R sided weakness and speech deficits. Her stroke is thought to be due to embolic etiology from Afib.  Carotid US did not reveal significant occlusive disease. Recent echo and was not repeated this admission.  PT/OT evaluated and recommended SNF at discharge due to increased disability of R sided weakness. She also endorses difficulty with slurred speech so recommend continued speech therapy.   She was continued on her home medications including eliquis. Neurology recommended considering transitioning eliquis to pradaxa if insurance would approve.    01/10/23 -stable for discharge to SNF  Discharge Condition: Stable, improved Recommended discharge diet: Regular healthy diet  Consultations: Neurology   Procedures/Studies: Carotid US  Discharge Instructions     Discharge patient   Complete by: As directed    Discharge disposition: 03-Skilled Nursing Facility   Discharge patient date: 01/11/2023      Allergies as of 01/11/2023       Reactions   Prednisone Other (See Comments)   Fatigue   Scopace [scopolamine] Other (See Comments)   Hallucinations        Medication List     STOP taking these medications    sennosides-docusate sodium 8.6-50 MG tablet Commonly known as: SENOKOT-S       TAKE these medications    acetaminophen 325 MG tablet Commonly known as: TYLENOL Take 3 tablets (975 mg total) by mouth at bedtime as needed for mild pain. What changed:  how much  to take when to take this   amiodarone 200 MG tablet Commonly known as: PACERONE Take 1 tablet (200 mg total) by mouth daily.   apixaban 5 MG Tabs tablet Commonly known as: ELIQUIS Take 1 tablet (5 mg total) by mouth 2 (two) times daily.   Biofreeze 4 % Gel Generic drug: Menthol (Topical Analgesic) Apply topically as needed.   diltiazem 360 MG 24 hr capsule Commonly known as: CARDIZEM CD Take 1 capsule (360 mg  total) by mouth daily.   furosemide 20 MG tablet Commonly known as: LASIX Take 20 mg by mouth daily.   gabapentin 100 MG capsule Commonly known as: NEURONTIN Take 1 capsule (100 mg total) by mouth 2 (two) times daily.   I-Vite Tabs Take 1 tablet by mouth daily.   lovastatin 20 MG tablet Commonly known as: MEVACOR Take 20 mg by mouth daily at 6 PM.   TUMS PO Take 2 tablets by mouth every 6 (six) hours as needed (acid reflux).         Subjective   Pt reports feeling well. Still having weakness on R and slurred speech. Denies pain, SOB, CP, palpitations.   All questions and concerns were addressed at time of discharge.  Objective  Blood pressure (!) 145/89, pulse 93, temperature 98.5 F (36.9 C), temperature source Oral, resp. rate 18, height 5\' 2"  (1.575 m), weight 75.8 kg, SpO2 97%.   General: Pt is alert, awake, not in acute distress Cardiovascular: RRR, S1/S2 +, no rubs, no gallops Respiratory: CTA bilaterally, no wheezing, no rhonchi Abdominal: Soft, NT, ND, bowel sounds + Neuro: R arm and leg minimal movement. Speech appears close to baseline.   The results of significant diagnostics from this hospitalization (including imaging, microbiology, ancillary and laboratory) are listed below for reference.   Imaging studies: MR Brain Wo Contrast (neuro protocol)  Result Date: 01/09/2023 CLINICAL DATA:  Neuro deficit, acute, stroke suspected. Weakness. Altered mental status. Decreased use of the right upper extremity EXAM: MRI HEAD WITHOUT CONTRAST TECHNIQUE: Multiplanar, multiecho pulse sequences of the brain and surrounding structures were obtained without intravenous contrast. COMPARISON:  CT head without contrast 01/09/2023. MR head without contrast 11/19/2022. FINDINGS: Brain: Acute nonhemorrhagic infarct is present in the high medial posterior left frontal lobe, adjacent to the previous infarct. T2 and FLAIR hyperintensities are associated. Moderate atrophy and diffuse  white matter changes are otherwise stable. Remote lacunar infarct is present in the right corona radiata. Dilated perivascular spaces are present throughout the basal ganglia. The ventricles are proportionate to the degree of atrophy. No significant extraaxial fluid collection is present. The brainstem and cerebellum are within normal limits. Midline structures are within normal limits. Vascular: Flow is present in the major intracranial arteries. Skull and upper cervical spine: Mild degenerative anterolisthesis is present at C3-4 C4-5 and C5-6. Craniocervical junction is normal. Sinuses/Orbits: The paranasal sinuses and mastoid air cells are clear. Bilateral lens replacements are noted. Globes and orbits are otherwise unremarkable. IMPRESSION: 1. Acute nonhemorrhagic infarct involving the high medial posterior left frontal lobe, adjacent to the previous infarct. 2. Moderate atrophy and diffuse white matter disease is otherwise stable. This likely reflects the sequela of chronic microvascular ischemia. Electronically Signed   By: Marin Roberts M.D.   On: 01/09/2023 17:21   DG Chest Port 1 View  Result Date: 01/09/2023 CLINICAL DATA:  Shortness of breath.  Weakness. EXAM: PORTABLE CHEST 1 VIEW COMPARISON:  One-view chest x-ray 11/17/2022 FINDINGS: The heart is enlarged. Atherosclerotic changes are present at the  aortic arch. Lung volumes are low. No edema or effusion is present. The visualized soft tissues and bony thorax are unremarkable. IMPRESSION: 1. Cardiomegaly without failure. 2. Low lung volumes. Electronically Signed   By: Marin Roberts M.D.   On: 01/09/2023 15:49   CT Head Wo Contrast  Result Date: 01/09/2023 CLINICAL DATA:  Neurologic deficit. EXAM: CT HEAD WITHOUT CONTRAST TECHNIQUE: Contiguous axial images were obtained from the base of the skull through the vertex without intravenous contrast. RADIATION DOSE REDUCTION: This exam was performed according to the departmental  dose-optimization program which includes automated exposure control, adjustment of the mA and/or kV according to patient size and/or use of iterative reconstruction technique. COMPARISON:  CT 11/18/2022, MRI 11/19/2022 FINDINGS: Brain: Ventricles, cisterns and other CSF spaces are within normal relative patient's age. There is moderate chronic ischemic microvascular disease. Old high left frontal parietal infarct as seen on previous MRI. Small lacunar infarcts of the basal ganglia bilaterally. No mass, mass effect, shift of midline structures or acute hemorrhage. No evidence of acute infarction. Vascular: No hyperdense vessel or unexpected calcification. Skull: Normal. Negative for fracture or focal lesion. Sinuses/Orbits: No acute finding. Other: None. IMPRESSION: 1. No acute findings. 2. Moderate chronic ischemic microvascular disease. Old high left frontal parietal infarct. Small lacunar infarcts of the basal ganglia bilaterally. Electronically Signed   By: Elberta Fortis M.D.   On: 01/09/2023 14:58    Labs: Basic Metabolic Panel: Recent Labs  Lab 01/09/23 1353 01/10/23 0715 01/11/23 0550  NA 139 139 136  K 3.8 3.6 3.8  CL 102 104 100  CO2 26 23 23   GLUCOSE 198* 112* 148*  BUN 22 21 20   CREATININE 1.42* 1.44* 1.41*  CALCIUM 8.8* 8.8* 9.0   CBC: Recent Labs  Lab 01/09/23 1353 01/10/23 0715 01/11/23 0550  WBC 6.2 6.2 8.6  NEUTROABS 4.2  --   --   HGB 9.0* 9.3* 9.6*  HCT 30.9* 32.3* 31.1*  MCV 85.6 85.4 83.6  PLT 335 281 307   Microbiology: Results for orders placed or performed during the hospital encounter of 11/17/22  MRSA Next Gen by PCR, Nasal     Status: None   Collection Time: 11/17/22  4:15 PM   Specimen: Nasal Mucosa; Nasal Swab  Result Value Ref Range Status   MRSA by PCR Next Gen NOT DETECTED NOT DETECTED Final    Comment: (NOTE) The GeneXpert MRSA Assay (FDA approved for NASAL specimens only), is one component of a comprehensive MRSA colonization  surveillance program. It is not intended to diagnose MRSA infection nor to guide or monitor treatment for MRSA infections. Test performance is not FDA approved in patients less than 33 years old. Performed at [facility], [address], [place], [place] [address]    Time coordinating discharge: Over 30 minutes  Leeroy Bock, MD  [facility] 01/11/2023, 11:54 AM

## 2023-01-11 NOTE — Progress Notes (Incomplete)
PROGRESS NOTE  [name] Petersen    DOB: 02-Sep-1926, 89 y.o.  YQI:[id number]    Code Status: Do not attempt resuscitation (DNR) - Comfort care   DOA: 01/09/2023   LOS: 2   Brief hospital course  [name] Petersen is a 89 y.o. female with a PMH significant for HTN, HLD, PAD, T2DM, HFpEF, L BKA, recent CVA.   Patient is a resident of [facility]. Noticed today by visiting family member to not be using her R arm and having dysarthria so EMS was called.  The patient was recently admitted 7/21-8/6 with A-fib with RVR and a CVA.  She was discharged on Eliquis and 81 mg ASA p.o. daily.  Patient has been bedbound since since her stroke.   In the ER patient's vital stable.  Hgb 9, prior hgb 12 (11/22/22), Cr1.42 prior Cr 1.15 (11/21/22) CT head without evidence for acute CVA.   MRI head shows acute nonhemorrhagic infarct involving the high medial posterior left frontal lobe, adjacent to the previous infarct. EKG atrial flutter HR 66.    They were initially treated with home medications and mIVF.  Neurology consulted by EDP.    Patient was admitted to medicine service for further workup and management of CVA as outlined in detail below.  01/11/23 -stable  Assessment & Plan  Active Problems:   PAD (peripheral artery disease) (HCC)   Essential hypertension   Type 2 diabetes mellitus with obesity (HCC)   Phantom limb pain (HCC)   History of stroke   Hyperlipidemia   Atrial flutter (HCC)   Acute ischemic stroke (HCC)   Acute renal failure superimposed on stage 3b chronic kidney disease (HCC)   Anemia   Cerebrovascular accident (CVA) (HCC)   Pressure injury of skin  acute nonhemorrhagic infarct- involving the high medial posterior left frontal lobe -neurology following, appreciate your recs -Will continue Eliquis -Neurochecks, PT/OT/speech consult -No need for permissive hypertension -2D echo, MRA head/neck not repeated -Statin not recommended   Anemia -No clear evidence of report of bleeding.  Hgb stable 9.0>9.3 -CBC am   AKI on chronic kidney disease , stage 3b. Baseline Cr around 1.1. Today is 1.4 -Gentle IV fluid hydration.  BMP in a.m.   Essential hypertension  Afib RVR- rate controlled. BP within goal range for age -Cardizem resumed -Continue amiodarone, Eliquis     Type 2 diabetes mellitus with obesity (HCC)- A1c pending -Sliding scale insulin ordered    HFpEF -Lasix p.o. daily held with AKI.  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   Body mass index is 30.54 kg/m.  VTE ppx:  apixaban (ELIQUIS) tablet 5 mg   Diet:     Diet   Diet regular Room service appropriate? Yes; Fluid consistency: Thin   Consultants: Neurology   Subjective 01/11/23    Pt reports no complaints today. She states she is unable to lift R leg.    Objective   Vitals:   01/10/23 2300 01/11/23 0053 01/11/23 0339 01/11/23 0738  BP: (!) 143/99 (!) 143/99 (!) 138/90 (!) 147/94  Pulse: 99 99 (!) 101 100  Resp: 18 18 16 18   Temp: 97.6 F (36.4 C) 98.2 F (36.8 C) (!) 97.5 F (36.4 C) 97.6 F (36.4 C)  TempSrc: Oral Oral Oral Oral  SpO2: 96% 96% 96% 93%  Weight:      Height:        Intake/Output Summary (Last 24 hours) at 01/11/2023 3875 Last  data filed at 01/11/2023 0300 Gross per 24 hour  Intake --  Output 350 ml  Net -350 ml   Filed Weights   01/09/23 1352  Weight: 75.8 kg     Physical Exam:  General: awake, alert, NAD HEENT: atraumatic, clear conjunctiva, anicteric sclera, MMM, hearing grossly normal Respiratory: normal respiratory effort. CTAB Cardiovascular: quick capillary refill, RRR Gastrointestinal: soft, NT, ND Nervous: A&O x2. Unable to lift R leg against resistance. L leg BKA appropriate strength for age/condition. Follows commands, answers questions appropriately without dysarthria.  Skin: dry, intact, normal temperature, normal color. No rashes, lesions or ulcers on exposed skin Psychiatry: flat mood,  congruent affect  Labs   I have personally reviewed the following labs and imaging studies CBC    Component Value Date/Time   WBC 8.6 01/11/2023 0550   RBC 3.72 (L) 01/11/2023 0550   HGB 9.6 (L) 01/11/2023 0550   HCT 31.1 (L) 01/11/2023 0550   PLT 307 01/11/2023 0550   MCV 83.6 01/11/2023 0550   MCH 25.8 (L) 01/11/2023 0550   MCHC 30.9 01/11/2023 0550   RDW 15.3 01/11/2023 0550   LYMPHSABS 1.2 01/09/2023 1353   MONOABS 0.7 01/09/2023 1353   EOSABS 0.1 01/09/2023 1353   BASOSABS 0.0 01/09/2023 1353      Latest Ref Rng & Units 01/11/2023    5:50 AM 01/10/2023    7:15 AM 01/09/2023    1:53 PM  BMP  Glucose 70 - 99 mg/dL 829  562  130   BUN 8 - 23 mg/dL 20  21  22    Creatinine 0.44 - 1.00 mg/dL 8.65  7.84  6.96   Sodium 135 - 145 mmol/L 136  139  139   Potassium 3.5 - 5.1 mmol/L 3.8  3.6  3.8   Chloride 98 - 111 mmol/L 100  104  102   CO2 22 - 32 mmol/L 23  23  26    Calcium 8.9 - 10.3 mg/dL 9.0  8.8  8.8     MR Brain Wo Contrast (neuro protocol)  Result Date: 01/09/2023 CLINICAL DATA:  Neuro deficit, acute, stroke suspected. Weakness. Altered mental status. Decreased use of the right upper extremity EXAM: MRI HEAD WITHOUT CONTRAST TECHNIQUE: Multiplanar, multiecho pulse sequences of the brain and surrounding structures were obtained without intravenous contrast. COMPARISON:  CT head without contrast 01/09/2023. MR head without contrast 11/19/2022. FINDINGS: Brain: Acute nonhemorrhagic infarct is present in the high medial posterior left frontal lobe, adjacent to the previous infarct. T2 and FLAIR hyperintensities are associated. Moderate atrophy and diffuse white matter changes are otherwise stable. Remote lacunar infarct is present in the right corona radiata. Dilated perivascular spaces are present throughout the basal ganglia. The ventricles are proportionate to the degree of atrophy. No significant extraaxial fluid collection is present. The brainstem and cerebellum are within  normal limits. Midline structures are within normal limits. Vascular: Flow is present in the major intracranial arteries. Skull and upper cervical spine: Mild degenerative anterolisthesis is present at C3-4 C4-5 and C5-6. Craniocervical junction is normal. Sinuses/Orbits: The paranasal sinuses and mastoid air cells are clear. Bilateral lens replacements are noted. Globes and orbits are otherwise unremarkable. IMPRESSION: 1. Acute nonhemorrhagic infarct involving the high medial posterior left frontal lobe, adjacent to the previous infarct. 2. Moderate atrophy and diffuse white matter disease is otherwise stable. This likely reflects the sequela of chronic microvascular ischemia. Electronically Signed   By: Marin Roberts M.D.   On: 01/09/2023 17:21   DG Chest Port 1  View  Result Date: 01/09/2023 CLINICAL DATA:  Shortness of breath.  Weakness. EXAM: PORTABLE CHEST 1 VIEW COMPARISON:  One-view chest x-ray 11/17/2022 FINDINGS: The heart is enlarged. Atherosclerotic changes are present at the aortic arch. Lung volumes are low. No edema or effusion is present. The visualized soft tissues and bony thorax are unremarkable. IMPRESSION: 1. Cardiomegaly without failure. 2. Low lung volumes. Electronically Signed   By: Marin Roberts M.D.   On: 01/09/2023 15:49   CT Head Wo Contrast  Result Date: 01/09/2023 CLINICAL DATA:  Neurologic deficit. EXAM: CT HEAD WITHOUT CONTRAST TECHNIQUE: Contiguous axial images were obtained from the base of the skull through the vertex without intravenous contrast. RADIATION DOSE REDUCTION: This exam was performed according to the departmental dose-optimization program which includes automated exposure control, adjustment of the mA and/or kV according to patient size and/or use of iterative reconstruction technique. COMPARISON:  CT 11/18/2022, MRI 11/19/2022 FINDINGS: Brain: Ventricles, cisterns and other CSF spaces are within normal relative patient's age. There is moderate  chronic ischemic microvascular disease. Old high left frontal parietal infarct as seen on previous MRI. Small lacunar infarcts of the basal ganglia bilaterally. No mass, mass effect, shift of midline structures or acute hemorrhage. No evidence of acute infarction. Vascular: No hyperdense vessel or unexpected calcification. Skull: Normal. Negative for fracture or focal lesion. Sinuses/Orbits: No acute finding. Other: None. IMPRESSION: 1. No acute findings. 2. Moderate chronic ischemic microvascular disease. Old high left frontal parietal infarct. Small lacunar infarcts of the basal ganglia bilaterally. Electronically Signed   By: Elberta Fortis M.D.   On: 01/09/2023 14:58    Disposition Plan & Communication  Patient status: Inpatient  Admitted From: SNF Planned disposition location: Skilled nursing facility Anticipated discharge date: 9/24 pending clinical improvement  Family Communication: none at bedside    Author: Leeroy Bock, DO [facility] 01/11/2023, 7:52 AM   Available by Epic secure chat 7AM-7PM. If 7PM-7AM, please contact night-coverage.  [facility] contact information found on [email or web address].

## 2023-01-11 NOTE — [place] FL2 (Signed)
[place] MEDICAID FL2 LEVEL OF CARE FORM     IDENTIFICATION  Patient Name: [name] Petersen Birthdate: 1927/04/03 Sex: female Admission Date (Current Location): 01/09/2023  [facility] and [place] Number:  [employer] and Address:  [facility], [address] [place], [place] [address]      Provider Number: [id number]  Attending Physician Name and Address:  Leeroy Bock, MD  Relative Name and Phone Number:       Current Level of Care: Hospital Recommended Level of Care: Skilled Nursing Facility Prior Approval Number:    Date Approved/Denied:   PASRR Number:    Discharge Plan: SNF    Current Diagnoses: Patient Active Problem List   Diagnosis Date Noted   Anemia 01/10/2023   Cerebrovascular accident (CVA) (HCC) 01/10/2023   Pressure injury of skin 01/10/2023   Acute CVA (cerebrovascular accident) (HCC) 01/09/2023   Acute renal failure superimposed on stage 3b chronic kidney disease (HCC) 01/09/2023   Acute ischemic stroke (HCC) 11/23/2022   Atrial flutter (HCC) 11/10/2022   Acute diastolic (congestive) heart failure (HCC) 11/09/2022   Atrial fibrillation with RVR (HCC) 09/11/2022   History of stroke 08/21/2020   Hyperlipidemia 08/21/2020   Urinary retention    Phantom limb pain (HCC)    Unilateral complete BKA, left, sequela (HCC)    Slow transit constipation    Atherosclerotic PVD with ulceration (HCC) 07/14/2017   Pressure injury of right heel, stage 3 (HCC)    Sleep disturbance    Poor nutrition    Hypokalemia    Drug-induced constipation    Essential hypertension    Stage 3 chronic kidney disease (HCC)    Postoperative pain    Type 2 diabetes mellitus with obesity (HCC)    S/P below knee amputation, left (HCC) 07/10/2017   PAD (peripheral artery disease) (HCC) 07/06/2017   Atherosclerosis of native arteries of the extremities with ulceration (HCC) 06/16/2017    Orientation RESPIRATION BLADDER Height & Weight      Self, Time, Situation, Place  Normal Continent Weight: 167 lb (75.8 kg) Height:  5\' 2"  (157.5 cm)  BEHAVIORAL SYMPTOMS/MOOD NEUROLOGICAL BOWEL NUTRITION STATUS      Continent Diet (regular)  AMBULATORY STATUS COMMUNICATION OF NEEDS Skin   Extensive Assist Verbally PU Stage and Appropriate Care   PU Stage 2 Dressing:  (mid sacrum: foam dressing, lift every shift to assess and change PRN)                   Personal Care Assistance Level of Assistance  Bathing, Feeding, Dressing Bathing Assistance: Maximum assistance Feeding assistance: Limited assistance Dressing Assistance: Maximum assistance     Functional Limitations Info  Sight, Hearing Sight Info: Impaired Hearing Info: Impaired      SPECIAL CARE FACTORS FREQUENCY                       Contractures Contractures Info: Not present    Additional Factors Info  Code Status, Allergies, Insulin Sliding Scale Code Status Info: DNR Allergies Info: Prednisone, Scopace (Scopolamine)   Insulin Sliding Scale Info: see DC summary       Current Medications (01/11/2023):  This is the current hospital active medication list Current Facility-Administered Medications  Medication Dose Route Frequency Provider Last Rate Last Admin   acetaminophen (TYLENOL) tablet 650 mg  650 mg Oral Q4H PRN Crosley, Debby, MD       Or   acetaminophen (TYLENOL) 160 MG/5ML solution  650 mg  650 mg Per Tube Q4H PRN Gery Pray, MD       Or   acetaminophen (TYLENOL) suppository 650 mg  650 mg Rectal Q4H PRN Crosley, Debby, MD       amiodarone (PACERONE) tablet 200 mg  200 mg Oral Daily Crosley, Debby, MD   200 mg at 01/11/23 0823   apixaban (ELIQUIS) tablet 5 mg  5 mg Oral BID Crosley, Debby, MD   5 mg at 01/11/23 0824   diltiazem (CARDIZEM CD) 24 hr capsule 360 mg  360 mg Oral Daily Crosley, Debby, MD   360 mg at 01/11/23 0823   insulin aspart (novoLOG) injection 0-15 Units  0-15 Units Subcutaneous TID WC Leeroy Bock, MD   2 Units at  01/11/23 0823   pantoprazole (PROTONIX) injection 40 mg  40 mg Intravenous Daily Crosley, Debby, MD   40 mg at 01/11/23 0823   pravastatin (PRAVACHOL) tablet 20 mg  20 mg Oral q1800 de Saintclair Halsted, Cortney E, NP   20 mg at 01/10/23 1719     Discharge Medications: Please see discharge summary for a list of discharge medications.  Relevant Imaging Results:  Relevant Lab Results:   Additional Information SS#: [id number]  Baldemar Lenis, LCSW

## 2023-01-12 DIAGNOSIS — R319 Hematuria, unspecified: Secondary | ICD-10-CM | POA: Diagnosis not present

## 2023-01-12 DIAGNOSIS — I130 Hypertensive heart and chronic kidney disease with heart failure and stage 1 through stage 4 chronic kidney disease, or unspecified chronic kidney disease: Secondary | ICD-10-CM | POA: Diagnosis not present

## 2023-01-12 DIAGNOSIS — I5033 Acute on chronic diastolic (congestive) heart failure: Secondary | ICD-10-CM | POA: Diagnosis not present

## 2023-01-12 DIAGNOSIS — I4891 Unspecified atrial fibrillation: Secondary | ICD-10-CM | POA: Diagnosis not present

## 2023-01-12 DIAGNOSIS — M6281 Muscle weakness (generalized): Secondary | ICD-10-CM | POA: Diagnosis not present

## 2023-01-12 DIAGNOSIS — I639 Cerebral infarction, unspecified: Secondary | ICD-10-CM | POA: Diagnosis not present

## 2023-01-12 DIAGNOSIS — E1122 Type 2 diabetes mellitus with diabetic chronic kidney disease: Secondary | ICD-10-CM | POA: Diagnosis not present

## 2023-01-12 DIAGNOSIS — I10 Essential (primary) hypertension: Secondary | ICD-10-CM | POA: Diagnosis not present

## 2023-01-12 DIAGNOSIS — G459 Transient cerebral ischemic attack, unspecified: Secondary | ICD-10-CM | POA: Diagnosis not present

## 2023-01-12 DIAGNOSIS — N1832 Chronic kidney disease, stage 3b: Secondary | ICD-10-CM | POA: Diagnosis not present

## 2023-01-12 DIAGNOSIS — R278 Other lack of coordination: Secondary | ICD-10-CM | POA: Diagnosis not present

## 2023-01-12 DIAGNOSIS — I739 Peripheral vascular disease, unspecified: Secondary | ICD-10-CM | POA: Diagnosis not present

## 2023-01-13 DIAGNOSIS — R278 Other lack of coordination: Secondary | ICD-10-CM | POA: Diagnosis not present

## 2023-01-13 DIAGNOSIS — G459 Transient cerebral ischemic attack, unspecified: Secondary | ICD-10-CM | POA: Diagnosis not present

## 2023-01-13 DIAGNOSIS — M6281 Muscle weakness (generalized): Secondary | ICD-10-CM | POA: Diagnosis not present

## 2023-01-13 DIAGNOSIS — I639 Cerebral infarction, unspecified: Secondary | ICD-10-CM | POA: Diagnosis not present

## 2023-01-14 DIAGNOSIS — I5033 Acute on chronic diastolic (congestive) heart failure: Secondary | ICD-10-CM | POA: Diagnosis not present

## 2023-01-14 DIAGNOSIS — I739 Peripheral vascular disease, unspecified: Secondary | ICD-10-CM | POA: Diagnosis not present

## 2023-01-14 DIAGNOSIS — E1122 Type 2 diabetes mellitus with diabetic chronic kidney disease: Secondary | ICD-10-CM | POA: Diagnosis not present

## 2023-01-14 DIAGNOSIS — D649 Anemia, unspecified: Secondary | ICD-10-CM | POA: Diagnosis not present

## 2023-01-14 DIAGNOSIS — I10 Essential (primary) hypertension: Secondary | ICD-10-CM | POA: Diagnosis not present

## 2023-01-14 DIAGNOSIS — R799 Abnormal finding of blood chemistry, unspecified: Secondary | ICD-10-CM | POA: Diagnosis not present

## 2023-01-14 DIAGNOSIS — I130 Hypertensive heart and chronic kidney disease with heart failure and stage 1 through stage 4 chronic kidney disease, or unspecified chronic kidney disease: Secondary | ICD-10-CM | POA: Diagnosis not present

## 2023-01-14 DIAGNOSIS — N1832 Chronic kidney disease, stage 3b: Secondary | ICD-10-CM | POA: Diagnosis not present

## 2023-01-14 DIAGNOSIS — R059 Cough, unspecified: Secondary | ICD-10-CM | POA: Diagnosis not present

## 2023-01-14 DIAGNOSIS — R0602 Shortness of breath: Secondary | ICD-10-CM | POA: Diagnosis not present

## 2023-01-14 DIAGNOSIS — R319 Hematuria, unspecified: Secondary | ICD-10-CM | POA: Diagnosis not present

## 2023-01-14 DIAGNOSIS — N390 Urinary tract infection, site not specified: Secondary | ICD-10-CM | POA: Diagnosis not present

## 2023-01-14 DIAGNOSIS — I4891 Unspecified atrial fibrillation: Secondary | ICD-10-CM | POA: Diagnosis not present

## 2023-01-14 DIAGNOSIS — J449 Chronic obstructive pulmonary disease, unspecified: Secondary | ICD-10-CM | POA: Diagnosis not present

## 2023-01-14 DIAGNOSIS — I639 Cerebral infarction, unspecified: Secondary | ICD-10-CM | POA: Diagnosis not present

## 2023-01-14 DIAGNOSIS — M6281 Muscle weakness (generalized): Secondary | ICD-10-CM | POA: Diagnosis not present

## 2023-01-17 DIAGNOSIS — I639 Cerebral infarction, unspecified: Secondary | ICD-10-CM | POA: Diagnosis not present

## 2023-01-17 DIAGNOSIS — M6281 Muscle weakness (generalized): Secondary | ICD-10-CM | POA: Diagnosis not present

## 2023-01-17 DIAGNOSIS — R278 Other lack of coordination: Secondary | ICD-10-CM | POA: Diagnosis not present

## 2023-01-17 DIAGNOSIS — I130 Hypertensive heart and chronic kidney disease with heart failure and stage 1 through stage 4 chronic kidney disease, or unspecified chronic kidney disease: Secondary | ICD-10-CM | POA: Diagnosis not present

## 2023-01-17 DIAGNOSIS — N1832 Chronic kidney disease, stage 3b: Secondary | ICD-10-CM | POA: Diagnosis not present

## 2023-01-17 DIAGNOSIS — E1122 Type 2 diabetes mellitus with diabetic chronic kidney disease: Secondary | ICD-10-CM | POA: Diagnosis not present

## 2023-01-17 DIAGNOSIS — I4891 Unspecified atrial fibrillation: Secondary | ICD-10-CM | POA: Diagnosis not present

## 2023-01-17 DIAGNOSIS — I5033 Acute on chronic diastolic (congestive) heart failure: Secondary | ICD-10-CM | POA: Diagnosis not present

## 2023-01-17 DIAGNOSIS — I10 Essential (primary) hypertension: Secondary | ICD-10-CM | POA: Diagnosis not present

## 2023-01-17 DIAGNOSIS — N390 Urinary tract infection, site not specified: Secondary | ICD-10-CM | POA: Diagnosis not present

## 2023-01-17 DIAGNOSIS — I739 Peripheral vascular disease, unspecified: Secondary | ICD-10-CM | POA: Diagnosis not present

## 2023-01-17 DIAGNOSIS — G459 Transient cerebral ischemic attack, unspecified: Secondary | ICD-10-CM | POA: Diagnosis not present

## 2023-01-18 DIAGNOSIS — I10 Essential (primary) hypertension: Secondary | ICD-10-CM | POA: Diagnosis not present

## 2023-01-18 DIAGNOSIS — I5033 Acute on chronic diastolic (congestive) heart failure: Secondary | ICD-10-CM | POA: Diagnosis not present

## 2023-01-18 DIAGNOSIS — G459 Transient cerebral ischemic attack, unspecified: Secondary | ICD-10-CM | POA: Diagnosis not present

## 2023-01-18 DIAGNOSIS — Z23 Encounter for immunization: Secondary | ICD-10-CM | POA: Diagnosis not present

## 2023-01-18 DIAGNOSIS — N1832 Chronic kidney disease, stage 3b: Secondary | ICD-10-CM | POA: Diagnosis not present

## 2023-01-18 DIAGNOSIS — Z7185 Encounter for immunization safety counseling: Secondary | ICD-10-CM | POA: Diagnosis not present

## 2023-01-18 DIAGNOSIS — I130 Hypertensive heart and chronic kidney disease with heart failure and stage 1 through stage 4 chronic kidney disease, or unspecified chronic kidney disease: Secondary | ICD-10-CM | POA: Diagnosis not present

## 2023-01-18 DIAGNOSIS — M6281 Muscle weakness (generalized): Secondary | ICD-10-CM | POA: Diagnosis not present

## 2023-01-18 DIAGNOSIS — I639 Cerebral infarction, unspecified: Secondary | ICD-10-CM | POA: Diagnosis not present

## 2023-01-18 DIAGNOSIS — I4891 Unspecified atrial fibrillation: Secondary | ICD-10-CM | POA: Diagnosis not present

## 2023-01-18 DIAGNOSIS — R278 Other lack of coordination: Secondary | ICD-10-CM | POA: Diagnosis not present

## 2023-01-18 DIAGNOSIS — E1122 Type 2 diabetes mellitus with diabetic chronic kidney disease: Secondary | ICD-10-CM | POA: Diagnosis not present

## 2023-01-18 DIAGNOSIS — I739 Peripheral vascular disease, unspecified: Secondary | ICD-10-CM | POA: Diagnosis not present

## 2023-01-19 DIAGNOSIS — I5033 Acute on chronic diastolic (congestive) heart failure: Secondary | ICD-10-CM | POA: Diagnosis not present

## 2023-01-19 DIAGNOSIS — I4891 Unspecified atrial fibrillation: Secondary | ICD-10-CM | POA: Diagnosis not present

## 2023-01-19 DIAGNOSIS — I10 Essential (primary) hypertension: Secondary | ICD-10-CM | POA: Diagnosis not present

## 2023-01-19 DIAGNOSIS — I739 Peripheral vascular disease, unspecified: Secondary | ICD-10-CM | POA: Diagnosis not present

## 2023-01-19 DIAGNOSIS — G459 Transient cerebral ischemic attack, unspecified: Secondary | ICD-10-CM | POA: Diagnosis not present

## 2023-01-19 DIAGNOSIS — N1832 Chronic kidney disease, stage 3b: Secondary | ICD-10-CM | POA: Diagnosis not present

## 2023-01-19 DIAGNOSIS — R278 Other lack of coordination: Secondary | ICD-10-CM | POA: Diagnosis not present

## 2023-01-19 DIAGNOSIS — I130 Hypertensive heart and chronic kidney disease with heart failure and stage 1 through stage 4 chronic kidney disease, or unspecified chronic kidney disease: Secondary | ICD-10-CM | POA: Diagnosis not present

## 2023-01-19 DIAGNOSIS — E1122 Type 2 diabetes mellitus with diabetic chronic kidney disease: Secondary | ICD-10-CM | POA: Diagnosis not present

## 2023-01-19 DIAGNOSIS — N390 Urinary tract infection, site not specified: Secondary | ICD-10-CM | POA: Diagnosis not present

## 2023-01-19 DIAGNOSIS — I639 Cerebral infarction, unspecified: Secondary | ICD-10-CM | POA: Diagnosis not present

## 2023-01-19 DIAGNOSIS — M6281 Muscle weakness (generalized): Secondary | ICD-10-CM | POA: Diagnosis not present

## 2023-01-20 DIAGNOSIS — I639 Cerebral infarction, unspecified: Secondary | ICD-10-CM | POA: Diagnosis not present

## 2023-01-20 DIAGNOSIS — R278 Other lack of coordination: Secondary | ICD-10-CM | POA: Diagnosis not present

## 2023-01-20 DIAGNOSIS — M6281 Muscle weakness (generalized): Secondary | ICD-10-CM | POA: Diagnosis not present

## 2023-01-20 DIAGNOSIS — G459 Transient cerebral ischemic attack, unspecified: Secondary | ICD-10-CM | POA: Diagnosis not present

## 2023-01-21 DIAGNOSIS — I10 Essential (primary) hypertension: Secondary | ICD-10-CM | POA: Diagnosis not present

## 2023-01-21 DIAGNOSIS — I739 Peripheral vascular disease, unspecified: Secondary | ICD-10-CM | POA: Diagnosis not present

## 2023-01-21 DIAGNOSIS — M6281 Muscle weakness (generalized): Secondary | ICD-10-CM | POA: Diagnosis not present

## 2023-01-21 DIAGNOSIS — I4891 Unspecified atrial fibrillation: Secondary | ICD-10-CM | POA: Diagnosis not present

## 2023-01-21 DIAGNOSIS — R278 Other lack of coordination: Secondary | ICD-10-CM | POA: Diagnosis not present

## 2023-01-21 DIAGNOSIS — I639 Cerebral infarction, unspecified: Secondary | ICD-10-CM | POA: Diagnosis not present

## 2023-01-21 DIAGNOSIS — N1832 Chronic kidney disease, stage 3b: Secondary | ICD-10-CM | POA: Diagnosis not present

## 2023-01-21 DIAGNOSIS — E1122 Type 2 diabetes mellitus with diabetic chronic kidney disease: Secondary | ICD-10-CM | POA: Diagnosis not present

## 2023-01-21 DIAGNOSIS — G459 Transient cerebral ischemic attack, unspecified: Secondary | ICD-10-CM | POA: Diagnosis not present

## 2023-01-21 DIAGNOSIS — N390 Urinary tract infection, site not specified: Secondary | ICD-10-CM | POA: Diagnosis not present

## 2023-01-21 DIAGNOSIS — I130 Hypertensive heart and chronic kidney disease with heart failure and stage 1 through stage 4 chronic kidney disease, or unspecified chronic kidney disease: Secondary | ICD-10-CM | POA: Diagnosis not present

## 2023-01-21 DIAGNOSIS — I5033 Acute on chronic diastolic (congestive) heart failure: Secondary | ICD-10-CM | POA: Diagnosis not present

## 2023-01-22 DIAGNOSIS — I639 Cerebral infarction, unspecified: Secondary | ICD-10-CM | POA: Diagnosis not present

## 2023-01-22 DIAGNOSIS — M6281 Muscle weakness (generalized): Secondary | ICD-10-CM | POA: Diagnosis not present

## 2023-01-22 DIAGNOSIS — G459 Transient cerebral ischemic attack, unspecified: Secondary | ICD-10-CM | POA: Diagnosis not present

## 2023-01-22 DIAGNOSIS — R278 Other lack of coordination: Secondary | ICD-10-CM | POA: Diagnosis not present

## 2023-01-24 DIAGNOSIS — R278 Other lack of coordination: Secondary | ICD-10-CM | POA: Diagnosis not present

## 2023-01-24 DIAGNOSIS — I739 Peripheral vascular disease, unspecified: Secondary | ICD-10-CM | POA: Diagnosis not present

## 2023-01-24 DIAGNOSIS — N1832 Chronic kidney disease, stage 3b: Secondary | ICD-10-CM | POA: Diagnosis not present

## 2023-01-24 DIAGNOSIS — I10 Essential (primary) hypertension: Secondary | ICD-10-CM | POA: Diagnosis not present

## 2023-01-24 DIAGNOSIS — I639 Cerebral infarction, unspecified: Secondary | ICD-10-CM | POA: Diagnosis not present

## 2023-01-24 DIAGNOSIS — I4891 Unspecified atrial fibrillation: Secondary | ICD-10-CM | POA: Diagnosis not present

## 2023-01-24 DIAGNOSIS — M6281 Muscle weakness (generalized): Secondary | ICD-10-CM | POA: Diagnosis not present

## 2023-01-24 DIAGNOSIS — I5033 Acute on chronic diastolic (congestive) heart failure: Secondary | ICD-10-CM | POA: Diagnosis not present

## 2023-01-24 DIAGNOSIS — I130 Hypertensive heart and chronic kidney disease with heart failure and stage 1 through stage 4 chronic kidney disease, or unspecified chronic kidney disease: Secondary | ICD-10-CM | POA: Diagnosis not present

## 2023-01-24 DIAGNOSIS — E1122 Type 2 diabetes mellitus with diabetic chronic kidney disease: Secondary | ICD-10-CM | POA: Diagnosis not present

## 2023-01-24 DIAGNOSIS — G459 Transient cerebral ischemic attack, unspecified: Secondary | ICD-10-CM | POA: Diagnosis not present

## 2023-01-25 DIAGNOSIS — M6281 Muscle weakness (generalized): Secondary | ICD-10-CM | POA: Diagnosis not present

## 2023-01-25 DIAGNOSIS — G459 Transient cerebral ischemic attack, unspecified: Secondary | ICD-10-CM | POA: Diagnosis not present

## 2023-01-25 DIAGNOSIS — I639 Cerebral infarction, unspecified: Secondary | ICD-10-CM | POA: Diagnosis not present

## 2023-01-25 DIAGNOSIS — R278 Other lack of coordination: Secondary | ICD-10-CM | POA: Diagnosis not present

## 2023-01-26 DIAGNOSIS — Z23 Encounter for immunization: Secondary | ICD-10-CM | POA: Diagnosis not present

## 2023-01-26 DIAGNOSIS — Z515 Encounter for palliative care: Secondary | ICD-10-CM | POA: Diagnosis not present

## 2023-01-26 DIAGNOSIS — R278 Other lack of coordination: Secondary | ICD-10-CM | POA: Diagnosis not present

## 2023-01-26 DIAGNOSIS — I639 Cerebral infarction, unspecified: Secondary | ICD-10-CM | POA: Diagnosis not present

## 2023-01-26 DIAGNOSIS — M6281 Muscle weakness (generalized): Secondary | ICD-10-CM | POA: Diagnosis not present

## 2023-01-26 DIAGNOSIS — G459 Transient cerebral ischemic attack, unspecified: Secondary | ICD-10-CM | POA: Diagnosis not present

## 2023-01-27 DIAGNOSIS — R278 Other lack of coordination: Secondary | ICD-10-CM | POA: Diagnosis not present

## 2023-01-27 DIAGNOSIS — M6281 Muscle weakness (generalized): Secondary | ICD-10-CM | POA: Diagnosis not present

## 2023-01-27 DIAGNOSIS — I639 Cerebral infarction, unspecified: Secondary | ICD-10-CM | POA: Diagnosis not present

## 2023-01-27 DIAGNOSIS — G459 Transient cerebral ischemic attack, unspecified: Secondary | ICD-10-CM | POA: Diagnosis not present

## 2023-01-28 DIAGNOSIS — M6281 Muscle weakness (generalized): Secondary | ICD-10-CM | POA: Diagnosis not present

## 2023-01-28 DIAGNOSIS — I639 Cerebral infarction, unspecified: Secondary | ICD-10-CM | POA: Diagnosis not present

## 2023-01-28 DIAGNOSIS — G459 Transient cerebral ischemic attack, unspecified: Secondary | ICD-10-CM | POA: Diagnosis not present

## 2023-01-28 DIAGNOSIS — R278 Other lack of coordination: Secondary | ICD-10-CM | POA: Diagnosis not present

## 2023-01-30 DIAGNOSIS — I639 Cerebral infarction, unspecified: Secondary | ICD-10-CM | POA: Diagnosis not present

## 2023-01-30 DIAGNOSIS — G459 Transient cerebral ischemic attack, unspecified: Secondary | ICD-10-CM | POA: Diagnosis not present

## 2023-01-30 DIAGNOSIS — M6281 Muscle weakness (generalized): Secondary | ICD-10-CM | POA: Diagnosis not present

## 2023-01-30 DIAGNOSIS — R278 Other lack of coordination: Secondary | ICD-10-CM | POA: Diagnosis not present

## 2023-01-31 DIAGNOSIS — R278 Other lack of coordination: Secondary | ICD-10-CM | POA: Diagnosis not present

## 2023-01-31 DIAGNOSIS — I639 Cerebral infarction, unspecified: Secondary | ICD-10-CM | POA: Diagnosis not present

## 2023-01-31 DIAGNOSIS — G459 Transient cerebral ischemic attack, unspecified: Secondary | ICD-10-CM | POA: Diagnosis not present

## 2023-01-31 DIAGNOSIS — M6281 Muscle weakness (generalized): Secondary | ICD-10-CM | POA: Diagnosis not present

## 2023-02-01 DIAGNOSIS — M6281 Muscle weakness (generalized): Secondary | ICD-10-CM | POA: Diagnosis not present

## 2023-02-01 DIAGNOSIS — I739 Peripheral vascular disease, unspecified: Secondary | ICD-10-CM | POA: Diagnosis not present

## 2023-02-01 DIAGNOSIS — I130 Hypertensive heart and chronic kidney disease with heart failure and stage 1 through stage 4 chronic kidney disease, or unspecified chronic kidney disease: Secondary | ICD-10-CM | POA: Diagnosis not present

## 2023-02-01 DIAGNOSIS — N1832 Chronic kidney disease, stage 3b: Secondary | ICD-10-CM | POA: Diagnosis not present

## 2023-02-01 DIAGNOSIS — E1122 Type 2 diabetes mellitus with diabetic chronic kidney disease: Secondary | ICD-10-CM | POA: Diagnosis not present

## 2023-02-01 DIAGNOSIS — G459 Transient cerebral ischemic attack, unspecified: Secondary | ICD-10-CM | POA: Diagnosis not present

## 2023-02-01 DIAGNOSIS — R600 Localized edema: Secondary | ICD-10-CM | POA: Diagnosis not present

## 2023-02-01 DIAGNOSIS — I5033 Acute on chronic diastolic (congestive) heart failure: Secondary | ICD-10-CM | POA: Diagnosis not present

## 2023-02-01 DIAGNOSIS — F5109 Other insomnia not due to a substance or known physiological condition: Secondary | ICD-10-CM | POA: Diagnosis not present

## 2023-02-01 DIAGNOSIS — I4891 Unspecified atrial fibrillation: Secondary | ICD-10-CM | POA: Diagnosis not present

## 2023-02-01 DIAGNOSIS — I10 Essential (primary) hypertension: Secondary | ICD-10-CM | POA: Diagnosis not present

## 2023-02-01 DIAGNOSIS — I639 Cerebral infarction, unspecified: Secondary | ICD-10-CM | POA: Diagnosis not present

## 2023-02-01 DIAGNOSIS — R278 Other lack of coordination: Secondary | ICD-10-CM | POA: Diagnosis not present

## 2023-02-02 DIAGNOSIS — G459 Transient cerebral ischemic attack, unspecified: Secondary | ICD-10-CM | POA: Diagnosis not present

## 2023-02-02 DIAGNOSIS — M6281 Muscle weakness (generalized): Secondary | ICD-10-CM | POA: Diagnosis not present

## 2023-02-02 DIAGNOSIS — R278 Other lack of coordination: Secondary | ICD-10-CM | POA: Diagnosis not present

## 2023-02-02 DIAGNOSIS — I639 Cerebral infarction, unspecified: Secondary | ICD-10-CM | POA: Diagnosis not present

## 2023-02-03 DIAGNOSIS — M6281 Muscle weakness (generalized): Secondary | ICD-10-CM | POA: Diagnosis not present

## 2023-02-03 DIAGNOSIS — R278 Other lack of coordination: Secondary | ICD-10-CM | POA: Diagnosis not present

## 2023-02-03 DIAGNOSIS — I639 Cerebral infarction, unspecified: Secondary | ICD-10-CM | POA: Diagnosis not present

## 2023-02-03 DIAGNOSIS — G459 Transient cerebral ischemic attack, unspecified: Secondary | ICD-10-CM | POA: Diagnosis not present

## 2023-02-04 DIAGNOSIS — G459 Transient cerebral ischemic attack, unspecified: Secondary | ICD-10-CM | POA: Diagnosis not present

## 2023-02-04 DIAGNOSIS — M6281 Muscle weakness (generalized): Secondary | ICD-10-CM | POA: Diagnosis not present

## 2023-02-04 DIAGNOSIS — R278 Other lack of coordination: Secondary | ICD-10-CM | POA: Diagnosis not present

## 2023-02-04 DIAGNOSIS — I639 Cerebral infarction, unspecified: Secondary | ICD-10-CM | POA: Diagnosis not present

## 2023-02-04 DIAGNOSIS — D510 Vitamin B12 deficiency anemia due to intrinsic factor deficiency: Secondary | ICD-10-CM | POA: Diagnosis not present

## 2023-02-07 DIAGNOSIS — M6281 Muscle weakness (generalized): Secondary | ICD-10-CM | POA: Diagnosis not present

## 2023-02-07 DIAGNOSIS — G459 Transient cerebral ischemic attack, unspecified: Secondary | ICD-10-CM | POA: Diagnosis not present

## 2023-02-07 DIAGNOSIS — R278 Other lack of coordination: Secondary | ICD-10-CM | POA: Diagnosis not present

## 2023-02-07 DIAGNOSIS — I639 Cerebral infarction, unspecified: Secondary | ICD-10-CM | POA: Diagnosis not present

## 2023-02-08 DIAGNOSIS — J449 Chronic obstructive pulmonary disease, unspecified: Secondary | ICD-10-CM | POA: Diagnosis not present

## 2023-02-08 DIAGNOSIS — D649 Anemia, unspecified: Secondary | ICD-10-CM | POA: Diagnosis not present

## 2023-02-08 DIAGNOSIS — M6281 Muscle weakness (generalized): Secondary | ICD-10-CM | POA: Diagnosis not present

## 2023-02-08 DIAGNOSIS — R278 Other lack of coordination: Secondary | ICD-10-CM | POA: Diagnosis not present

## 2023-02-08 DIAGNOSIS — G459 Transient cerebral ischemic attack, unspecified: Secondary | ICD-10-CM | POA: Diagnosis not present

## 2023-02-08 DIAGNOSIS — I639 Cerebral infarction, unspecified: Secondary | ICD-10-CM | POA: Diagnosis not present

## 2023-02-09 DIAGNOSIS — G459 Transient cerebral ischemic attack, unspecified: Secondary | ICD-10-CM | POA: Diagnosis not present

## 2023-02-09 DIAGNOSIS — I639 Cerebral infarction, unspecified: Secondary | ICD-10-CM | POA: Diagnosis not present

## 2023-02-09 DIAGNOSIS — M6281 Muscle weakness (generalized): Secondary | ICD-10-CM | POA: Diagnosis not present

## 2023-02-09 DIAGNOSIS — R278 Other lack of coordination: Secondary | ICD-10-CM | POA: Diagnosis not present

## 2023-02-10 DIAGNOSIS — I639 Cerebral infarction, unspecified: Secondary | ICD-10-CM | POA: Diagnosis not present

## 2023-02-10 DIAGNOSIS — R600 Localized edema: Secondary | ICD-10-CM | POA: Diagnosis not present

## 2023-02-10 DIAGNOSIS — I5030 Unspecified diastolic (congestive) heart failure: Secondary | ICD-10-CM | POA: Diagnosis not present

## 2023-02-10 DIAGNOSIS — I130 Hypertensive heart and chronic kidney disease with heart failure and stage 1 through stage 4 chronic kidney disease, or unspecified chronic kidney disease: Secondary | ICD-10-CM | POA: Diagnosis not present

## 2023-02-10 DIAGNOSIS — Z515 Encounter for palliative care: Secondary | ICD-10-CM | POA: Diagnosis not present

## 2023-02-10 DIAGNOSIS — E119 Type 2 diabetes mellitus without complications: Secondary | ICD-10-CM | POA: Diagnosis not present

## 2023-02-10 DIAGNOSIS — R278 Other lack of coordination: Secondary | ICD-10-CM | POA: Diagnosis not present

## 2023-02-10 DIAGNOSIS — N1832 Chronic kidney disease, stage 3b: Secondary | ICD-10-CM | POA: Diagnosis not present

## 2023-02-10 DIAGNOSIS — Z89512 Acquired absence of left leg below knee: Secondary | ICD-10-CM | POA: Diagnosis not present

## 2023-02-10 DIAGNOSIS — M6281 Muscle weakness (generalized): Secondary | ICD-10-CM | POA: Diagnosis not present

## 2023-02-10 DIAGNOSIS — G459 Transient cerebral ischemic attack, unspecified: Secondary | ICD-10-CM | POA: Diagnosis not present

## 2023-02-10 DIAGNOSIS — I10 Essential (primary) hypertension: Secondary | ICD-10-CM | POA: Diagnosis not present

## 2023-02-10 DIAGNOSIS — I739 Peripheral vascular disease, unspecified: Secondary | ICD-10-CM | POA: Diagnosis not present

## 2023-02-10 DIAGNOSIS — I4891 Unspecified atrial fibrillation: Secondary | ICD-10-CM | POA: Diagnosis not present

## 2023-02-10 DIAGNOSIS — I6930 Unspecified sequelae of cerebral infarction: Secondary | ICD-10-CM | POA: Diagnosis not present

## 2023-02-10 DIAGNOSIS — I69351 Hemiplegia and hemiparesis following cerebral infarction affecting right dominant side: Secondary | ICD-10-CM | POA: Diagnosis not present

## 2023-02-11 DIAGNOSIS — R278 Other lack of coordination: Secondary | ICD-10-CM | POA: Diagnosis not present

## 2023-02-11 DIAGNOSIS — M6281 Muscle weakness (generalized): Secondary | ICD-10-CM | POA: Diagnosis not present

## 2023-02-11 DIAGNOSIS — G459 Transient cerebral ischemic attack, unspecified: Secondary | ICD-10-CM | POA: Diagnosis not present

## 2023-02-11 DIAGNOSIS — I639 Cerebral infarction, unspecified: Secondary | ICD-10-CM | POA: Diagnosis not present

## 2023-02-14 DIAGNOSIS — M6281 Muscle weakness (generalized): Secondary | ICD-10-CM | POA: Diagnosis not present

## 2023-02-14 DIAGNOSIS — G459 Transient cerebral ischemic attack, unspecified: Secondary | ICD-10-CM | POA: Diagnosis not present

## 2023-02-14 DIAGNOSIS — I639 Cerebral infarction, unspecified: Secondary | ICD-10-CM | POA: Diagnosis not present

## 2023-02-14 DIAGNOSIS — R278 Other lack of coordination: Secondary | ICD-10-CM | POA: Diagnosis not present

## 2023-02-15 DIAGNOSIS — R278 Other lack of coordination: Secondary | ICD-10-CM | POA: Diagnosis not present

## 2023-02-15 DIAGNOSIS — G459 Transient cerebral ischemic attack, unspecified: Secondary | ICD-10-CM | POA: Diagnosis not present

## 2023-02-15 DIAGNOSIS — M6281 Muscle weakness (generalized): Secondary | ICD-10-CM | POA: Diagnosis not present

## 2023-02-15 DIAGNOSIS — I639 Cerebral infarction, unspecified: Secondary | ICD-10-CM | POA: Diagnosis not present

## 2023-02-16 DIAGNOSIS — M6281 Muscle weakness (generalized): Secondary | ICD-10-CM | POA: Diagnosis not present

## 2023-02-16 DIAGNOSIS — R278 Other lack of coordination: Secondary | ICD-10-CM | POA: Diagnosis not present

## 2023-02-16 DIAGNOSIS — I639 Cerebral infarction, unspecified: Secondary | ICD-10-CM | POA: Diagnosis not present

## 2023-02-16 DIAGNOSIS — G459 Transient cerebral ischemic attack, unspecified: Secondary | ICD-10-CM | POA: Diagnosis not present

## 2023-02-17 DIAGNOSIS — G459 Transient cerebral ischemic attack, unspecified: Secondary | ICD-10-CM | POA: Diagnosis not present

## 2023-02-17 DIAGNOSIS — I639 Cerebral infarction, unspecified: Secondary | ICD-10-CM | POA: Diagnosis not present

## 2023-02-17 DIAGNOSIS — M6281 Muscle weakness (generalized): Secondary | ICD-10-CM | POA: Diagnosis not present

## 2023-02-17 DIAGNOSIS — R278 Other lack of coordination: Secondary | ICD-10-CM | POA: Diagnosis not present

## 2023-02-18 DIAGNOSIS — I639 Cerebral infarction, unspecified: Secondary | ICD-10-CM | POA: Diagnosis not present

## 2023-02-18 DIAGNOSIS — R278 Other lack of coordination: Secondary | ICD-10-CM | POA: Diagnosis not present

## 2023-02-18 DIAGNOSIS — M6281 Muscle weakness (generalized): Secondary | ICD-10-CM | POA: Diagnosis not present

## 2023-02-18 DIAGNOSIS — R1312 Dysphagia, oropharyngeal phase: Secondary | ICD-10-CM | POA: Diagnosis not present

## 2023-02-18 DIAGNOSIS — G459 Transient cerebral ischemic attack, unspecified: Secondary | ICD-10-CM | POA: Diagnosis not present

## 2023-02-19 DIAGNOSIS — I639 Cerebral infarction, unspecified: Secondary | ICD-10-CM | POA: Diagnosis not present

## 2023-02-19 DIAGNOSIS — G459 Transient cerebral ischemic attack, unspecified: Secondary | ICD-10-CM | POA: Diagnosis not present

## 2023-02-19 DIAGNOSIS — R1312 Dysphagia, oropharyngeal phase: Secondary | ICD-10-CM | POA: Diagnosis not present

## 2023-02-19 DIAGNOSIS — M6281 Muscle weakness (generalized): Secondary | ICD-10-CM | POA: Diagnosis not present

## 2023-02-19 DIAGNOSIS — R278 Other lack of coordination: Secondary | ICD-10-CM | POA: Diagnosis not present

## 2023-02-20 DIAGNOSIS — M6281 Muscle weakness (generalized): Secondary | ICD-10-CM | POA: Diagnosis not present

## 2023-02-20 DIAGNOSIS — R1312 Dysphagia, oropharyngeal phase: Secondary | ICD-10-CM | POA: Diagnosis not present

## 2023-02-20 DIAGNOSIS — I639 Cerebral infarction, unspecified: Secondary | ICD-10-CM | POA: Diagnosis not present

## 2023-02-20 DIAGNOSIS — G459 Transient cerebral ischemic attack, unspecified: Secondary | ICD-10-CM | POA: Diagnosis not present

## 2023-02-20 DIAGNOSIS — R278 Other lack of coordination: Secondary | ICD-10-CM | POA: Diagnosis not present

## 2023-02-21 DIAGNOSIS — R278 Other lack of coordination: Secondary | ICD-10-CM | POA: Diagnosis not present

## 2023-02-21 DIAGNOSIS — G459 Transient cerebral ischemic attack, unspecified: Secondary | ICD-10-CM | POA: Diagnosis not present

## 2023-02-21 DIAGNOSIS — R1312 Dysphagia, oropharyngeal phase: Secondary | ICD-10-CM | POA: Diagnosis not present

## 2023-02-21 DIAGNOSIS — M6281 Muscle weakness (generalized): Secondary | ICD-10-CM | POA: Diagnosis not present

## 2023-02-21 DIAGNOSIS — I639 Cerebral infarction, unspecified: Secondary | ICD-10-CM | POA: Diagnosis not present

## 2023-02-22 DIAGNOSIS — R278 Other lack of coordination: Secondary | ICD-10-CM | POA: Diagnosis not present

## 2023-02-22 DIAGNOSIS — M6281 Muscle weakness (generalized): Secondary | ICD-10-CM | POA: Diagnosis not present

## 2023-02-22 DIAGNOSIS — G459 Transient cerebral ischemic attack, unspecified: Secondary | ICD-10-CM | POA: Diagnosis not present

## 2023-02-22 DIAGNOSIS — R1312 Dysphagia, oropharyngeal phase: Secondary | ICD-10-CM | POA: Diagnosis not present

## 2023-02-22 DIAGNOSIS — I639 Cerebral infarction, unspecified: Secondary | ICD-10-CM | POA: Diagnosis not present

## 2023-02-23 DIAGNOSIS — M6281 Muscle weakness (generalized): Secondary | ICD-10-CM | POA: Diagnosis not present

## 2023-02-23 DIAGNOSIS — R1312 Dysphagia, oropharyngeal phase: Secondary | ICD-10-CM | POA: Diagnosis not present

## 2023-02-23 DIAGNOSIS — G459 Transient cerebral ischemic attack, unspecified: Secondary | ICD-10-CM | POA: Diagnosis not present

## 2023-02-23 DIAGNOSIS — I639 Cerebral infarction, unspecified: Secondary | ICD-10-CM | POA: Diagnosis not present

## 2023-02-23 DIAGNOSIS — R278 Other lack of coordination: Secondary | ICD-10-CM | POA: Diagnosis not present

## 2023-02-24 DIAGNOSIS — R0989 Other specified symptoms and signs involving the circulatory and respiratory systems: Secondary | ICD-10-CM | POA: Diagnosis not present

## 2023-02-24 DIAGNOSIS — R278 Other lack of coordination: Secondary | ICD-10-CM | POA: Diagnosis not present

## 2023-02-24 DIAGNOSIS — R1312 Dysphagia, oropharyngeal phase: Secondary | ICD-10-CM | POA: Diagnosis not present

## 2023-02-24 DIAGNOSIS — M6281 Muscle weakness (generalized): Secondary | ICD-10-CM | POA: Diagnosis not present

## 2023-02-24 DIAGNOSIS — G459 Transient cerebral ischemic attack, unspecified: Secondary | ICD-10-CM | POA: Diagnosis not present

## 2023-02-24 DIAGNOSIS — R062 Wheezing: Secondary | ICD-10-CM | POA: Diagnosis not present

## 2023-02-24 DIAGNOSIS — R059 Cough, unspecified: Secondary | ICD-10-CM | POA: Diagnosis not present

## 2023-02-24 DIAGNOSIS — J449 Chronic obstructive pulmonary disease, unspecified: Secondary | ICD-10-CM | POA: Diagnosis not present

## 2023-02-24 DIAGNOSIS — I639 Cerebral infarction, unspecified: Secondary | ICD-10-CM | POA: Diagnosis not present

## 2023-02-24 DIAGNOSIS — M25431 Effusion, right wrist: Secondary | ICD-10-CM | POA: Diagnosis not present

## 2023-02-24 DIAGNOSIS — M25531 Pain in right wrist: Secondary | ICD-10-CM | POA: Diagnosis not present

## 2023-02-25 DIAGNOSIS — R799 Abnormal finding of blood chemistry, unspecified: Secondary | ICD-10-CM | POA: Diagnosis not present

## 2023-02-26 DIAGNOSIS — I10 Essential (primary) hypertension: Secondary | ICD-10-CM | POA: Diagnosis not present

## 2023-02-28 DIAGNOSIS — R1312 Dysphagia, oropharyngeal phase: Secondary | ICD-10-CM | POA: Diagnosis not present

## 2023-02-28 DIAGNOSIS — G459 Transient cerebral ischemic attack, unspecified: Secondary | ICD-10-CM | POA: Diagnosis not present

## 2023-02-28 DIAGNOSIS — I639 Cerebral infarction, unspecified: Secondary | ICD-10-CM | POA: Diagnosis not present

## 2023-02-28 DIAGNOSIS — R278 Other lack of coordination: Secondary | ICD-10-CM | POA: Diagnosis not present

## 2023-02-28 DIAGNOSIS — M6281 Muscle weakness (generalized): Secondary | ICD-10-CM | POA: Diagnosis not present

## 2023-03-01 DIAGNOSIS — G459 Transient cerebral ischemic attack, unspecified: Secondary | ICD-10-CM | POA: Diagnosis not present

## 2023-03-01 DIAGNOSIS — R1312 Dysphagia, oropharyngeal phase: Secondary | ICD-10-CM | POA: Diagnosis not present

## 2023-03-01 DIAGNOSIS — R278 Other lack of coordination: Secondary | ICD-10-CM | POA: Diagnosis not present

## 2023-03-01 DIAGNOSIS — M6281 Muscle weakness (generalized): Secondary | ICD-10-CM | POA: Diagnosis not present

## 2023-03-01 DIAGNOSIS — I639 Cerebral infarction, unspecified: Secondary | ICD-10-CM | POA: Diagnosis not present

## 2023-03-02 DIAGNOSIS — I639 Cerebral infarction, unspecified: Secondary | ICD-10-CM | POA: Diagnosis not present

## 2023-03-02 DIAGNOSIS — G459 Transient cerebral ischemic attack, unspecified: Secondary | ICD-10-CM | POA: Diagnosis not present

## 2023-03-02 DIAGNOSIS — M6281 Muscle weakness (generalized): Secondary | ICD-10-CM | POA: Diagnosis not present

## 2023-03-02 DIAGNOSIS — R1312 Dysphagia, oropharyngeal phase: Secondary | ICD-10-CM | POA: Diagnosis not present

## 2023-03-02 DIAGNOSIS — R278 Other lack of coordination: Secondary | ICD-10-CM | POA: Diagnosis not present

## 2023-03-03 DIAGNOSIS — R1312 Dysphagia, oropharyngeal phase: Secondary | ICD-10-CM | POA: Diagnosis not present

## 2023-03-03 DIAGNOSIS — I639 Cerebral infarction, unspecified: Secondary | ICD-10-CM | POA: Diagnosis not present

## 2023-03-03 DIAGNOSIS — R278 Other lack of coordination: Secondary | ICD-10-CM | POA: Diagnosis not present

## 2023-03-03 DIAGNOSIS — M6281 Muscle weakness (generalized): Secondary | ICD-10-CM | POA: Diagnosis not present

## 2023-03-03 DIAGNOSIS — G459 Transient cerebral ischemic attack, unspecified: Secondary | ICD-10-CM | POA: Diagnosis not present

## 2023-03-04 DIAGNOSIS — I639 Cerebral infarction, unspecified: Secondary | ICD-10-CM | POA: Diagnosis not present

## 2023-03-04 DIAGNOSIS — Z515 Encounter for palliative care: Secondary | ICD-10-CM | POA: Diagnosis not present

## 2023-03-04 DIAGNOSIS — R1312 Dysphagia, oropharyngeal phase: Secondary | ICD-10-CM | POA: Diagnosis not present

## 2023-03-04 DIAGNOSIS — M6281 Muscle weakness (generalized): Secondary | ICD-10-CM | POA: Diagnosis not present

## 2023-03-04 DIAGNOSIS — L603 Nail dystrophy: Secondary | ICD-10-CM | POA: Diagnosis not present

## 2023-03-04 DIAGNOSIS — R278 Other lack of coordination: Secondary | ICD-10-CM | POA: Diagnosis not present

## 2023-03-04 DIAGNOSIS — L602 Onychogryphosis: Secondary | ICD-10-CM | POA: Diagnosis not present

## 2023-03-04 DIAGNOSIS — I739 Peripheral vascular disease, unspecified: Secondary | ICD-10-CM | POA: Diagnosis not present

## 2023-03-04 DIAGNOSIS — L84 Corns and callosities: Secondary | ICD-10-CM | POA: Diagnosis not present

## 2023-03-04 DIAGNOSIS — G459 Transient cerebral ischemic attack, unspecified: Secondary | ICD-10-CM | POA: Diagnosis not present

## 2023-03-07 DIAGNOSIS — M6281 Muscle weakness (generalized): Secondary | ICD-10-CM | POA: Diagnosis not present

## 2023-03-07 DIAGNOSIS — R1312 Dysphagia, oropharyngeal phase: Secondary | ICD-10-CM | POA: Diagnosis not present

## 2023-03-07 DIAGNOSIS — G459 Transient cerebral ischemic attack, unspecified: Secondary | ICD-10-CM | POA: Diagnosis not present

## 2023-03-07 DIAGNOSIS — R278 Other lack of coordination: Secondary | ICD-10-CM | POA: Diagnosis not present

## 2023-03-07 DIAGNOSIS — I639 Cerebral infarction, unspecified: Secondary | ICD-10-CM | POA: Diagnosis not present

## 2023-03-08 DIAGNOSIS — G459 Transient cerebral ischemic attack, unspecified: Secondary | ICD-10-CM | POA: Diagnosis not present

## 2023-03-08 DIAGNOSIS — I69351 Hemiplegia and hemiparesis following cerebral infarction affecting right dominant side: Secondary | ICD-10-CM | POA: Diagnosis not present

## 2023-03-08 DIAGNOSIS — I69391 Dysphagia following cerebral infarction: Secondary | ICD-10-CM | POA: Diagnosis not present

## 2023-03-08 DIAGNOSIS — E119 Type 2 diabetes mellitus without complications: Secondary | ICD-10-CM | POA: Diagnosis not present

## 2023-03-08 DIAGNOSIS — R278 Other lack of coordination: Secondary | ICD-10-CM | POA: Diagnosis not present

## 2023-03-08 DIAGNOSIS — M6281 Muscle weakness (generalized): Secondary | ICD-10-CM | POA: Diagnosis not present

## 2023-03-08 DIAGNOSIS — I5030 Unspecified diastolic (congestive) heart failure: Secondary | ICD-10-CM | POA: Diagnosis not present

## 2023-03-08 DIAGNOSIS — I639 Cerebral infarction, unspecified: Secondary | ICD-10-CM | POA: Diagnosis not present

## 2023-03-08 DIAGNOSIS — N1832 Chronic kidney disease, stage 3b: Secondary | ICD-10-CM | POA: Diagnosis not present

## 2023-03-08 DIAGNOSIS — Z89512 Acquired absence of left leg below knee: Secondary | ICD-10-CM | POA: Diagnosis not present

## 2023-03-08 DIAGNOSIS — I130 Hypertensive heart and chronic kidney disease with heart failure and stage 1 through stage 4 chronic kidney disease, or unspecified chronic kidney disease: Secondary | ICD-10-CM | POA: Diagnosis not present

## 2023-03-08 DIAGNOSIS — I4891 Unspecified atrial fibrillation: Secondary | ICD-10-CM | POA: Diagnosis not present

## 2023-03-08 DIAGNOSIS — I739 Peripheral vascular disease, unspecified: Secondary | ICD-10-CM | POA: Diagnosis not present

## 2023-03-08 DIAGNOSIS — R1312 Dysphagia, oropharyngeal phase: Secondary | ICD-10-CM | POA: Diagnosis not present

## 2023-03-09 DIAGNOSIS — M6281 Muscle weakness (generalized): Secondary | ICD-10-CM | POA: Diagnosis not present

## 2023-03-09 DIAGNOSIS — R278 Other lack of coordination: Secondary | ICD-10-CM | POA: Diagnosis not present

## 2023-03-09 DIAGNOSIS — R1312 Dysphagia, oropharyngeal phase: Secondary | ICD-10-CM | POA: Diagnosis not present

## 2023-03-09 DIAGNOSIS — I639 Cerebral infarction, unspecified: Secondary | ICD-10-CM | POA: Diagnosis not present

## 2023-03-09 DIAGNOSIS — G459 Transient cerebral ischemic attack, unspecified: Secondary | ICD-10-CM | POA: Diagnosis not present

## 2023-03-10 DIAGNOSIS — I739 Peripheral vascular disease, unspecified: Secondary | ICD-10-CM | POA: Diagnosis not present

## 2023-03-10 DIAGNOSIS — Z515 Encounter for palliative care: Secondary | ICD-10-CM | POA: Diagnosis not present

## 2023-03-10 DIAGNOSIS — Z89512 Acquired absence of left leg below knee: Secondary | ICD-10-CM | POA: Diagnosis not present

## 2023-03-10 DIAGNOSIS — I6930 Unspecified sequelae of cerebral infarction: Secondary | ICD-10-CM | POA: Diagnosis not present

## 2023-03-10 DIAGNOSIS — I4891 Unspecified atrial fibrillation: Secondary | ICD-10-CM | POA: Diagnosis not present

## 2023-03-10 DIAGNOSIS — I5030 Unspecified diastolic (congestive) heart failure: Secondary | ICD-10-CM | POA: Diagnosis not present

## 2023-03-10 DIAGNOSIS — I69351 Hemiplegia and hemiparesis following cerebral infarction affecting right dominant side: Secondary | ICD-10-CM | POA: Diagnosis not present

## 2023-03-10 DIAGNOSIS — I10 Essential (primary) hypertension: Secondary | ICD-10-CM | POA: Diagnosis not present

## 2023-03-10 DIAGNOSIS — I130 Hypertensive heart and chronic kidney disease with heart failure and stage 1 through stage 4 chronic kidney disease, or unspecified chronic kidney disease: Secondary | ICD-10-CM | POA: Diagnosis not present

## 2023-03-10 DIAGNOSIS — N1832 Chronic kidney disease, stage 3b: Secondary | ICD-10-CM | POA: Diagnosis not present

## 2023-03-10 DIAGNOSIS — E119 Type 2 diabetes mellitus without complications: Secondary | ICD-10-CM | POA: Diagnosis not present

## 2023-03-15 DIAGNOSIS — J449 Chronic obstructive pulmonary disease, unspecified: Secondary | ICD-10-CM | POA: Diagnosis not present

## 2023-03-15 DIAGNOSIS — L89153 Pressure ulcer of sacral region, stage 3: Secondary | ICD-10-CM | POA: Diagnosis not present

## 2023-03-15 DIAGNOSIS — D649 Anemia, unspecified: Secondary | ICD-10-CM | POA: Diagnosis not present

## 2023-03-22 DIAGNOSIS — L89153 Pressure ulcer of sacral region, stage 3: Secondary | ICD-10-CM | POA: Diagnosis not present

## 2023-03-29 DIAGNOSIS — L89153 Pressure ulcer of sacral region, stage 3: Secondary | ICD-10-CM | POA: Diagnosis not present

## 2023-03-30 DIAGNOSIS — Z515 Encounter for palliative care: Secondary | ICD-10-CM | POA: Diagnosis not present

## 2023-03-31 DIAGNOSIS — L89153 Pressure ulcer of sacral region, stage 3: Secondary | ICD-10-CM | POA: Diagnosis not present

## 2023-04-05 DIAGNOSIS — I739 Peripheral vascular disease, unspecified: Secondary | ICD-10-CM | POA: Diagnosis not present

## 2023-04-05 DIAGNOSIS — L89153 Pressure ulcer of sacral region, stage 3: Secondary | ICD-10-CM | POA: Diagnosis not present

## 2023-04-05 DIAGNOSIS — I69351 Hemiplegia and hemiparesis following cerebral infarction affecting right dominant side: Secondary | ICD-10-CM | POA: Diagnosis not present

## 2023-04-05 DIAGNOSIS — Z89512 Acquired absence of left leg below knee: Secondary | ICD-10-CM | POA: Diagnosis not present

## 2023-04-05 DIAGNOSIS — N1832 Chronic kidney disease, stage 3b: Secondary | ICD-10-CM | POA: Diagnosis not present

## 2023-04-05 DIAGNOSIS — I5030 Unspecified diastolic (congestive) heart failure: Secondary | ICD-10-CM | POA: Diagnosis not present

## 2023-04-05 DIAGNOSIS — I130 Hypertensive heart and chronic kidney disease with heart failure and stage 1 through stage 4 chronic kidney disease, or unspecified chronic kidney disease: Secondary | ICD-10-CM | POA: Diagnosis not present

## 2023-04-05 DIAGNOSIS — I4891 Unspecified atrial fibrillation: Secondary | ICD-10-CM | POA: Diagnosis not present

## 2023-04-07 DIAGNOSIS — I10 Essential (primary) hypertension: Secondary | ICD-10-CM | POA: Diagnosis not present

## 2023-04-07 DIAGNOSIS — I739 Peripheral vascular disease, unspecified: Secondary | ICD-10-CM | POA: Diagnosis not present

## 2023-04-07 DIAGNOSIS — Z515 Encounter for palliative care: Secondary | ICD-10-CM | POA: Diagnosis not present

## 2023-04-07 DIAGNOSIS — I6930 Unspecified sequelae of cerebral infarction: Secondary | ICD-10-CM | POA: Diagnosis not present

## 2023-04-07 DIAGNOSIS — E119 Type 2 diabetes mellitus without complications: Secondary | ICD-10-CM | POA: Diagnosis not present

## 2023-04-07 DIAGNOSIS — N1832 Chronic kidney disease, stage 3b: Secondary | ICD-10-CM | POA: Diagnosis not present

## 2023-04-07 DIAGNOSIS — Z89512 Acquired absence of left leg below knee: Secondary | ICD-10-CM | POA: Diagnosis not present

## 2023-04-07 DIAGNOSIS — K5900 Constipation, unspecified: Secondary | ICD-10-CM | POA: Diagnosis not present

## 2023-04-07 DIAGNOSIS — I5030 Unspecified diastolic (congestive) heart failure: Secondary | ICD-10-CM | POA: Diagnosis not present

## 2023-04-07 DIAGNOSIS — I130 Hypertensive heart and chronic kidney disease with heart failure and stage 1 through stage 4 chronic kidney disease, or unspecified chronic kidney disease: Secondary | ICD-10-CM | POA: Diagnosis not present

## 2023-04-07 DIAGNOSIS — I69351 Hemiplegia and hemiparesis following cerebral infarction affecting right dominant side: Secondary | ICD-10-CM | POA: Diagnosis not present

## 2023-04-07 DIAGNOSIS — I4891 Unspecified atrial fibrillation: Secondary | ICD-10-CM | POA: Diagnosis not present

## 2023-04-11 DIAGNOSIS — J449 Chronic obstructive pulmonary disease, unspecified: Secondary | ICD-10-CM | POA: Diagnosis not present

## 2023-04-11 DIAGNOSIS — D649 Anemia, unspecified: Secondary | ICD-10-CM | POA: Diagnosis not present

## 2023-04-12 DIAGNOSIS — L89153 Pressure ulcer of sacral region, stage 3: Secondary | ICD-10-CM | POA: Diagnosis not present
